# Patient Record
Sex: Male | Born: 1940
Health system: Southern US, Community
[De-identification: ages and names within clinical notes are randomized; demographics above are authoritative.]

## PROBLEM LIST (undated history)

## (undated) DIAGNOSIS — I2510 Atherosclerotic heart disease of native coronary artery without angina pectoris: Secondary | ICD-10-CM

## (undated) DIAGNOSIS — N400 Enlarged prostate without lower urinary tract symptoms: Secondary | ICD-10-CM

## (undated) DIAGNOSIS — I509 Heart failure, unspecified: Secondary | ICD-10-CM

## (undated) DIAGNOSIS — I472 Ventricular tachycardia: Secondary | ICD-10-CM

## (undated) DIAGNOSIS — E785 Hyperlipidemia, unspecified: Secondary | ICD-10-CM

## (undated) DIAGNOSIS — I2699 Other pulmonary embolism without acute cor pulmonale: Secondary | ICD-10-CM

## (undated) DIAGNOSIS — I771 Stricture of artery: Secondary | ICD-10-CM

## (undated) DIAGNOSIS — I10 Essential (primary) hypertension: Secondary | ICD-10-CM

## (undated) DIAGNOSIS — I82409 Acute embolism and thrombosis of unspecified deep veins of unspecified lower extremity: Secondary | ICD-10-CM

## (undated) DIAGNOSIS — E669 Obesity, unspecified: Secondary | ICD-10-CM

## (undated) DIAGNOSIS — R000 Tachycardia, unspecified: Secondary | ICD-10-CM

## (undated) DIAGNOSIS — I38 Endocarditis, valve unspecified: Secondary | ICD-10-CM

## (undated) DIAGNOSIS — M1990 Unspecified osteoarthritis, unspecified site: Secondary | ICD-10-CM

## (undated) NOTE — *Deleted (*Deleted)
   02/12/20 2154  TOC ED Mini Assessment  TOC Time spent with patient (minutes): 25  PING Used in TOC Assessment Yes  Admission or Readmission Diverted Yes  Interventions which prevented an admission or readmission Home Health Consult or Services  What brought you to the Emergency Department?  Fall  Barriers to Discharge No Barriers Identified  Means of departure Ambulance  CMS Medicare.gov Compare Post Acute Care list provided to: Patient  Choice offered to / list presented to  Patient  CM reciecieved consult for [facility] recommendations, ED CM spoke with patient and family who are agreeable, Offered choice but discussed the limitations of available [facility] agencies, patient has no preference. CM will follow up with patient tomorrow on [facility] agency

---

## 2002-07-16 ENCOUNTER — Encounter: Payer: Self-pay | Admitting: *Deleted

## 2002-07-16 ENCOUNTER — Emergency Department (HOSPITAL_COMMUNITY): Admission: EM | Admit: 2002-07-16 | Discharge: 2002-07-16 | Payer: Self-pay | Admitting: Emergency Medicine

## 2005-06-26 ENCOUNTER — Encounter: Payer: Self-pay | Admitting: Emergency Medicine

## 2005-06-26 ENCOUNTER — Ambulatory Visit: Payer: Self-pay | Admitting: Cardiology

## 2005-06-26 ENCOUNTER — Inpatient Hospital Stay (HOSPITAL_COMMUNITY): Admission: EM | Admit: 2005-06-26 | Discharge: 2005-07-02 | Payer: Self-pay | Admitting: Internal Medicine

## 2005-06-28 ENCOUNTER — Encounter: Payer: Self-pay | Admitting: Cardiology

## 2006-09-14 ENCOUNTER — Encounter: Admission: RE | Admit: 2006-09-14 | Discharge: 2006-09-14 | Payer: Self-pay | Admitting: Nephrology

## 2006-12-27 ENCOUNTER — Encounter: Admission: RE | Admit: 2006-12-27 | Discharge: 2006-12-27 | Payer: Self-pay | Admitting: Nephrology

## 2009-08-07 ENCOUNTER — Encounter: Admission: RE | Admit: 2009-08-07 | Discharge: 2009-08-07 | Payer: Self-pay | Admitting: Nephrology

## 2010-07-31 NOTE — Cardiovascular Report (Signed)
NAME:  Kevin [name], Kevin [name] NO.:  [id number]   MEDICAL RECORD NO.:  [id number]          PATIENT TYPE:  INP   LOCATION:  4738                         FACILITY:  [facility]   PHYSICIAN:  Salvadore Farber, M.D. LHCDATE OF BIRTH:  1940/10/12   DATE OF PROCEDURE:  06/30/2005  DATE OF DISCHARGE:                              CARDIAC CATHETERIZATION   PROCEDURE:  1.  Left heart catheterization.  2.  Left ventriculography.  3.  Coronary angiography.  4.  StarClose closure of the right common femoral arteriotomy site.   INDICATIONS:  [name] is a 70 year old gentleman without prior history  of cardiovascular disease.  However, risk factors include diabetes mellitus  and hypertension.  He presents with approximately two months of progressive  dyspnea culminating in severe resting dyspnea.  On admission troponins were  mildly elevated.  He is referred for diagnostic angiography.   PROCEDURAL TECHNIQUE:  Informed consent was obtained.  Under 1% lidocaine  local anesthesia a 5-French sheath was placed in the right common femoral  artery using the modified Seldinger technique.  Diagnostic angiography and  ventriculography were performed using JL4, JR4, and pigtail catheters.  The  arteriotomy was then closed using a StarClose device.  Complete hemostasis  was obtained.  He was then transferred to the holding room in stable  condition having tolerated the procedure well.   COMPLICATIONS:  None.   FINDINGS:  1.  LV 131/19/26.  EF approximately 20% with global hypokinesis.  There is      no aortic stenosis.  There is 1+ mitral regurgitation  2.  Left main:  Angiographically normal.  3.  LAD:  Large vessel wrapping the apex of the heart and giving rise to      moderate sized diagonals.  The mid LAD has a 40% stenosis at the origin      of the first diagonal.  The remainder is free of significant disease.  4.  Circumflex:  Fairly large vessel giving rise to three obtuse  marginals.      It is angiographically normal.  5.  RCA:  Moderate-sized dominant vessel.  There is a 40% stenosis of the      mid vessel.   IMPRESSION/RECOMMENDATIONS:  1.  Severe non-ischemic cardiomyopathy.  2.  Mild non-obstructive coronary disease.  3.  Moderately elevated left ventricular end-diastolic pressure.   Will plan on continued medical therapy for his heart failure.  Will continue  his Coreg.  Will plan on adding ACE inhibitor tomorrow once his IV dye has  cleared.  Wall ultimately need to consider ICD implantation.   Of note, his distal thoracic aorta and abdominal aorta were markedly  tortuous raising concern for aneurysm.  Outpatient ultrasound will be  arranged.      Salvadore Farber, M.D. [facility]  Electronically Signed     WED/MEDQ  D:  06/30/2005  T:  06/30/2005  Job:  [id number]

## 2010-07-31 NOTE — Consult Note (Signed)
NAME:  Kevin [name], Kevin [name] NO.:  [id number]   MEDICAL RECORD NO.:  [id number]          PATIENT TYPE:  INP   LOCATION:  4738                         FACILITY:  [facility]   PHYSICIAN:  Lorain Childes, M.D. LHCDATE OF BIRTH:  1940-10-03   DATE OF CONSULTATION:  06/26/2005  DATE OF DISCHARGE:                                   CONSULTATION   He is new to cardiology and does not have a primary care physician.  He is  admitted by [facility].   CHIEF COMPLAINT:  Chest tightness and shortness of breath.   HISTORY OF PRESENT ILLNESS:  The patient is a 70 year old with history of  chronic renal insufficiency who presents to the ER for further evaluation of  shortness of breath.  He was diagnosed with acute heart failure and admitted  to the [facility] by Della Goo, M.D.  His cardiac enzymes have  now returned positive and cardiology is asked to see him.  The patient  reports he has had gradual increasing shortness of breath over the past  several months, steadily worsening, but not limiting his activity until 3  weeks ago.  He now becomes dyspneic when walking to the diner at the motel  where he lives.  The walk is uphill and has become more strenuous.  He has  also had some mild chest tightness with walking.  He denies any nausea,  vomiting, and no diaphoresis.  He does report some discomfort extending into  his left arm with heaviness.  He disregarded this discomfort until today  when his symptoms progressed this morning.  He awoke and he was feeling  okay, but then became more short of breath as the day progressed.  As he  tried to dress and do his activities, he felt more short of breath and had  chest tightness.  He then came to the emergency room for further evaluation.  He denies any palpitations, no syncopal events, no PND.  He has two-pillow  orthopnea.  No lower extremity edema.  Currently, he reports that his chest  tightness or chest  pain-free.  He continues to have dyspnea, but it is  improved with the Lasix he received in the emergency room.   PAST MEDICAL HISTORY:  Chronic renal insufficiency.  He does not know his  baseline creatinine.  He has been told that he had some kidney problems.  Obesity.   HOME MEDICATIONS:  None.   CURRENT MEDICATIONS:  1.  Lasix 40 mg IV q.8 hours.  2.  Aspirin 325 mg p.o. daily.  3.  Nitropaste one inch topically q.6 hours.  4.  Lopressor 5 mg IV q.6 hours.  5.  Lovenox per pharmacy.  6.  Protonix 40 mg daily.  7.  Albuterol and Atrovent nebulizer.   ALLERGIES:  No known drug allergies.   SOCIAL HISTORY:  He lives in [place].  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 He  last did this 2 months ago and reports he is able to do his activities  without interruption.  He has a 60+ pack-year history of tobacco, he quit 4  weeks ago due to shortness of breath.  He denies any alcohol, no drugs, and  no herbal medication use.  He does not follow any regular exercise.   FAMILY HISTORY:  His mother died at the age of 50 from an MI and a stroke.  Father died related to diabetic complications.  He has a brother who has  coronary artery disease who had stents placed at the age of 64.   REVIEW OF SYSTEMS:  He denies any fevers, chills, no sweats, no weight  changes.  No headache or visual changes.  No skin rashes or lesions.  He  reports chest pain per HPI and shortness of breath as described above.  He  also has some wheezing.  He has coughing with gray sputum production.  He  denies any urinary symptoms, no focal weakness.  No nausea and vomiting.  No  bright red blood per rectum.  No melena, and no GERD symptoms.  All other  symptoms are negative.   PHYSICAL EXAMINATION:  VITAL SIGNS:  Temperature 98.8, pulse 100,  respirations 20, blood pressure 179/123, he is saturating 98% on 2 liters  nasal cannula.  GENERAL:  He is a very obese man sitting on the side of the bed in no acute   distress.  HEENT:  Normocephalic and atraumatic.  NECK:  JVP is approximately 10-11 cm.  He has 2+ carotid upstroke.  No  bruits.  CARDIOVASCULAR:  Normal S1, split S2, regular rate and rhythm.  He has an S4  present.  Pulses are 2+ throughout, there is no bruit.  He has a systolic  murmur noted at the apex.  He has coarse breath sounds which obscure some of  the findings.  LUNGS:  He has coarse breath sounds with expiratory wheezing noted.  He has  rales noted on his right base approximately 1/4 of the way up.  ABDOMEN:  Soft, obese, positive bowel sounds, nontender, and no  organomegaly.  EXTREMITIES:  He has trace bipedal edema.  NEUROLOGY:  Appears nonfocal.   Chest x-ray shows pulmonary edema.   EKG shows rate of 82, sinus rhythm, left axis deviation, incomplete left  bundle branch block.  PR interval 134 milliseconds, QRS 112 milliseconds,  QTC 488.  He has T wave inversions in aVL and V6.  He has T wave flattening  in 1 and V5.  He has no EKG for comparison.   LABORATORY DATA:  White count 4.3, hematocrit 47.1, platelets 210,  creatinine 1.7, potassium 5.1.  CK 431, MB 13.6, troponin 1.26.   ASSESSMENT:  The patient is a 70 year old gentleman who presents with acute  heart failure and MI.  1.  Coronary artery disease.  The patient has a non-ST segment elevation      myocardial infarction.  We will continue to monitor him on telemetry and      check cardiac enzymes for peak in trend.  We will give anticoagulation      with Lovenox dosing per pharmacy.  I agree with his aspirin therapy and      starting him on a beta blocker and statin.  He has had chest tightness      today and his cardiac enzymes are negative, so I will start him on      eptifibatide with pharmacy to dose.  Also begin him on a nitroglycerin      drip to assist with his blood pressure and with angina.  We will likely  plan a cardiac catheterization on Monday.  We will check a fasting lipid     panel in  the interim and adjust his statin as needed.  Thankfully he has      stopped smoking.  We encouraged him to continue in this effort.  2.  Acute heart failure, likely ischemic etiology and also his hypertension      contributing.  We are managing his CAD as above.  Agree with diuresing      him with IV Lasix to improve his overall volume status.  We need to      aggressively control his blood pressure by starting him on nitroglycerin      drip to assist his blood pressure control and this is also assisting      with preload and after load reduction for his heart failure.  We will      titrate his medications as we try to wean his nitroglycerin drip.  3.  Chronic obstructive pulmonary disease.  The patient has wheezing on      examination.  We will monitor his respiratory status as he is on a beta      blocker.  He is receiving nebulizers p.r.n.  We may consider switching      his albuterol to Xopenex.  4.  Chronic renal insufficiency.  The patient does not know his baseline      creatinine.  His creatinine today is 1.7.  We will continue to monitor      this closely and recommend checking a UA for protein.  His renal      insufficiency is likely secondary to untreated hypertension.  5.  Social.  The patient has some social issues and will require a social      work consult during this hospitalization.           ______________________________  Lorain Childes, M.D. [facility]     CGF/MEDQ  D:  06/27/2005  T:  06/28/2005  Job:  [id number]

## 2010-07-31 NOTE — H&P (Signed)
NAME:  Kevin [name], Kevin [name] NO.:  [id number]   MEDICAL RECORD NO.:  [id number]          PATIENT TYPE:  EMS   LOCATION:  ED                           FACILITY[facility]  [facility]   PHYSICIAN:  Della Goo, M.D. DATE OF BIRTH:  [birth year]   DATE OF ADMISSION:  06/26/2005  DATE OF DISCHARGE:                                HISTORY & PHYSICAL   CHIEF COMPLAINT:  Worsening work of breathing.   HISTORY OF PRESENT ILLNESS:  This is a 70 year old male with complaints of  worsening shortness of breath over a 3-week period. He reports prior to this  he had been getting progressive shortness of breath for over 1.5 years. The  patient had not been to see a physician in regard to his symptoms. He denies  having any cough, fevers, chills or chest pain associated with this. However  he did report having dyspnea on exertion and mild edema of both ankles. He  reports taking a neighbors nebulizer treatment last p.m. and having some  minor relief. However symptoms continued to worsen and patient presented to  the [facility] emergency department for evaluation. The patient was  evaluated in the emergency department, placed on 3 liters of nasal cannula  oxygen with relief of symptoms. His oxygen saturations were 98% and  increased to 99%. Initial laboratory studies which were performed revealed a  BNP of 1220 which is highly elevated. Chest x-ray examination also revealed  a right pleural effusion and congestive heart failure signs. The patient was  diagnosed with acute congestive heart failure, given IV Lasix 40 mg x1 dose  and began to have diuresis.   REVIEW OF SYSTEMS:  No fever, chills, nausea, vomiting, no cough or cough  production. Chronic shortness of breath. No wheezes, no syncope, no  dizziness, no chest pain. The patient does have joint pains consistent with  osteoarthritis.   PAST MEDICAL HISTORY:  None per patient report.   PAST SURGICAL HISTORY:  None per patient report.   MEDICATIONS:  None per patient report.   ALLERGIES:  No known drug allergies.   SOCIAL HISTORY:  The patient lives alone. Son lives locally. He previously  worked as a Designer, fashion/clothing. Reports quitting tobacco 4 weeks ago. However has a  chronic history of tobacco usage. Denies alcohol or drug usage.   PHYSICAL EXAMINATION:  GENERAL:  A pleasant 70 year old Native American  male, obese, well-developed. Currently in no acute distress.  VITAL SIGNS:  Temperature 97.0, blood pressure 180/90 on admission,  currently 149/91, heart rate 94, respirations 14. Oxygen saturation on 3  liters 99%.  HEENT:  Normocephalic, atraumatic. Pupils are equal, round and reactive to  light. Extraocular muscles are intact. There is no scleral icterus. Tympanic  membranes are clear bilaterally. Poor dentition. No exudate.  NECK:  Supple, full range of motion, no thyromegaly, no jugular venous  distension.  CARDIOVASCULAR:  Regular rate and rhythm with occasional ectopic beats. No  murmurs, rubs, or gallops.  LUNGS:  With decreased breath sounds. However clear to auscultation without  wheezes or rhonchi.  ABDOMEN:  Positive bowel sounds, soft, obese, nontender, nondistended.  No  hepatosplenomegaly.  EXTREMITIES:  Trace edema to the pretibial area.  NEUROLOGIC EXAMINATION:  Nonfocal.  GENITOURINARY:  Deferred.   LABORATORY STUDIES:  CBC with a white blood cell count of 4.3 thousand,  hemoglobin 15.0, hematocrit 47.1, platelets 210,000, neutrophils 59%,  lymphocytes 30%. Chemistries with sodium of 135, potassium 5.1, chloride 94,  CO2 33, BUN 13, creatinine 1.7 and glucose 107. Calcium level 9.0. BNP 1220.  ABG with a pH of 7.455, PCO2 40.8, PO2 122, bicarb 28.2, O2 saturation 97.9.  EKG reveals a normal sinus rhythm with occasions PVCs. Chest x-ray with  findings consistent with congestive heart failure and a right-sided pleural  effusion.   ASSESSMENT:  1.  Acute congestive heart failure.  2.  Right pleural  effusion.  3.  Hypertension which is reactive versus essential.  4.  Chronic renal insufficiency which is mild.  5.  Tobacco history.  6.  Tinea infection of the right hand.   PLAN:  The patient has been admitted to the telemetry area. Cardiac enzymes  will be continued x3. Supplemental nasal cannula oxygen and aspirin therapy  has been ordered. The patient will continue on IV Lasix q.8 hours with  potassium supplementation. His blood pressures will be monitored for changes  and patient has also been placed on GI and DVT prophylaxis. Nebulizer  treatments have also been ordered p.r.n.           ______________________________  Della Goo, M.D.     HJ/MEDQ  D:  06/26/2005  T:  06/26/2005  Job:  [id number]

## 2010-07-31 NOTE — Discharge Summary (Signed)
NAME:  Kevin [name], Kevin [name]              ACCOUNT NO.:  [id number]   MEDICAL RECORD NO.:  [id number]          PATIENT TYPE:  INP   LOCATION:  4738                         FACILITY:  [facility]   PHYSICIAN:  Mobolaji B. Bakare, M.D.DATE OF BIRTH:  [birth year]   DATE OF ADMISSION:  06/26/2005  DATE OF DISCHARGE:  07/02/2005                                 DISCHARGE SUMMARY   PRIMARY CARE PHYSICIAN:  Unassigned.   CONSULTS:  Cardiology consult, Dr. Gala Romney.   FINAL DIAGNOSES:  1.  Congestive heart failure.  2.  Nonischemic cardiomyopathy with ejection fraction of 20%.  3.  Non-ST-elevation myocardial infarction.  4.  Wide-complex tachycardia on telemetry.  5.  Renal insufficiency.  6.  Hypertension.  7.  Obesity.  8.  Hypokalemia.  9.  Hematuria.  10. Benign prostatic hypertrophy.   PROCEDURES:  1.  Chest x-ray done on June 26, 2005, showed chronic-appearing lung      changes with interstitial prominence; cardiac enlargement; right basilar      process, possibly a combination of effusion, atelectasis and/or      infiltrate.  2.  Chest x-ray on April 18 showed improving right lower lobe atelectasis      versus pneumonia, cardiomegaly with vascular congestion but no definite      CHF.  3.  A 2-D echocardiogram done on June 27, 2005, showed left ventricular      systolic function markedly reduced with ejection fraction estimated to      be 10-20% with severe diffuse left ventricular hypokinesia, moderate      mitral valve regurgitation, increased estimated peak pulmonary artery      pressure, increased estimated peak pulmonary artery pressure, mildly      dilated left atrium.  4.  Cardiac catheterization done on June 30, 2005.  Please see full report      for details.  He has severe nonischemic cardiomyopathy, mild to      nonobstructive coronary disease, moderately elevated left ventricular      end-diastolic pressure.   BRIEF HISTORY:  Kevin [name] is a 70 year old Native American  male who has  no primary care physician and no known medical problems.  He presented with  progressive worsening shortness of breath over the course of 18 months.  It  became worse in the preceding weeks prior to hospitalization.  He had mild  ankle swelling, dyspnea on exertion.  There were no accompanying chills or  pleuritic chest pain.  On initial evaluation, his vital signs were blood  pressure of 180/90, heart rate of 94, O2 saturations 99% on 3 L.  Physical  examination revealed an obese gentleman who was in no acute distress.  There  were decreased breath sounds in both lung fields.  There were no wheezes or  rhonchi.  CVS revealed S1, S2 without murmur or gallop.  There were  occasional ectopic beats.  He had no elevated neck veins; however, he has a  short neck.  Abdomen was obese and nontender.  The rest of physical  examination was unremarkable.  Initial laboratory data showed BNP of 1220,  creatinine of  1.7, and BUN of 13.  EKG revealed normal sinus rhythm with  occasional PVCs.  Initial chest x-ray findings were consistent with  cardiomegaly and bibasilar effusions.  [name] was admitted for acute  CHF, hypertension, renal insufficiency.  He needed further evaluation and  stabilization.  Cardiology consult was obtained.   HOSPITAL COURSE:  #1 - CONGESTIVE HEART FAILURE.  [name] was admitted  onto telemetry and to be ruled out.  He was started on IV Lasix,  nitroglycerin, Lopressor, and aspirin.  He was also given nebulization  treatment.  His initial cardiac enzymes revealed a CK-MB of 13.6, troponin  of 1.26, and relative index of 3.2.  These were consistent with non-ST-  elevation MI.  He had no EKG changes.  He was started on Integrilin and  heparin.  Cardiology recommended cardiac catheterization.  He underwent  cardiac catheterization on April 18; result is as noted above.  Recommendation is to continue medical treatment with ACE inhibitor and beta  blocker.   The patient may ultimately need to be considered for ICD.  He did  diurese well, was placed on fluid restriction.  Lasix was transitioned from  IV to p.o.  He will follow up with Dr. Gala Romney in the office.  His weight  on discharge was 247.5 pounds (weight on admission was 257.6 pounds).   The patient had short runs of wide-complex tachycardia on telemetry.  He was  discharged home on beta blocker.   #2 - BENIGN PROSTATIC HYPERTROPHY.  While the patient was on diuretics he  developed urinary symptoms which included pressure and increased frequency  of micturition, which is not unexplained with diuretics.  However, he had  very little quantity voiding at each time and he strained at micturition.  Symptomatically, this was compatible with BPH.  A Foley catheter was  inserted.  He subsequently developed hematuria and this was felt to be  related to probable trauma.  The patient continued to have hematuria and  subsequently developed frank bleeding.  Integrilin and heparin were  discontinued.  Hematuria subsided.  There was no remarkable change in his  hemoglobin and hematocrit.  He did not need blood transfusion.  Urine became  clearer and at the time of discharge there was no tinge of blood in his  urine.  I discussed with Dr. Laverle Patter.  Decision was to follow up with Dr.  Laverle Patter in 1 week.  The patient should have Foley catheter in place.  He was  started on Flomax 0.4 mg daily.  The patient will have further evaluation in  Dr. Vevelyn Royals office.   #3 - HYPERTENSION.  His blood pressure was initially uncontrolled at the  time of admission.  The patient has no known history of hypertension.  He  was started on beta blocker and ACE inhibitor.  Blood pressure at the time  of discharge was controlled.   #4 - RENAL INSUFFICIENCY.  Creatinine at the time of admission was 1.7.  The  patient's baseline creatinine was unknown.  Nephrotoxic medications were avoided.  At the time of discharge,  creatinine was 1.5 with a creatinine  clearance of 47.1.  He had episodes of hyperkalemia and hypokalemia and  these were corrected accordingly.   #5 - OBESITY AND TOBACCO ABUSE.  The patient was counseled regarding weight  loss and tobacco cessation.  He expressed understanding regarding his diet  and also tobacco cessation.   DISPOSITION:  Arrangement was made for the patient to follow up with [facility] in [place].  Was given contact information.  Home health R.N.  was also arranged to follow up.  Home health social worker and nurse were  arranged to follow up with the patient via [facility].  He would  also follow up with Dr. Gala Romney in the office.   CONDITION ON DISCHARGE:  Stable.  He was hemodynamically stable, saturating  well on room air.  He did not require home oxygen.   DISCHARGE MEDICATIONS:  1.  Lipitor 20 mg q.p.m.  2.  Coreg 6.25 mg b.i.d.  3.  Lisinopril 10 mg daily.  4.  Lasix 40 mg daily p.o.  5.  Flomax 0.4 mg daily.  6.  Nizoral cream applied to rash (Candida intertrigo).   FOLLOWUP:  1.  With Dr. Heloise Purpura in 1 week.  2.  With Dr. Gala Romney on Jul 22, 2005, at 12:15 p.m.   RECOMMENDATIONS/SPECIAL INSTRUCTIONS:  The patient to have BMET checked on  July 07, 2005, at [facility] and he should check his weight three  times a week and show record to his cardiology.   DIET:  Low salt diet/low cholesterol diet, fluid restriction.   PERTINENT LABORATORY DATA:  Discharge BMET:  Sodium 133, potassium 4.2,  chloride 97, CO2 28, glucose 100, BUN 21, creatinine 1.5, calcium 8.5.  White cells 4.4, hemoglobin 13.5, hematocrit 41.9, platelets 172.  BNP 498.  Total cholesterol 124, triglycerides 62, HDL 24, LDL 88.  TSH 1.829.      Mobolaji B. Corky Downs, M.D.  Electronically Signed     MBB/MEDQ  D:  07/13/2005  T:  07/13/2005  Job:  [id number]   cc:   Crecencio Mc, M.D.  Fax: [phone]   Arvilla Meres, M.D. [facility]  [employer]  [address]  [place]  [place] [address]

## 2010-09-15 ENCOUNTER — Other Ambulatory Visit: Payer: Self-pay | Admitting: Nephrology

## 2010-09-15 ENCOUNTER — Ambulatory Visit
Admission: RE | Admit: 2010-09-15 | Discharge: 2010-09-15 | Disposition: A | Discharge status: Home / Self Care | Payer: Medicare Other | Source: Ambulatory Visit | Attending: Nephrology | Admitting: Nephrology

## 2010-09-15 DIAGNOSIS — F17200 Nicotine dependence, unspecified, uncomplicated: Secondary | ICD-10-CM

## 2011-03-25 DIAGNOSIS — M545 Low back pain: Secondary | ICD-10-CM | POA: Diagnosis not present

## 2011-04-27 DIAGNOSIS — M545 Low back pain: Secondary | ICD-10-CM | POA: Diagnosis not present

## 2011-05-12 DIAGNOSIS — M545 Low back pain: Secondary | ICD-10-CM | POA: Diagnosis not present

## 2011-06-10 DIAGNOSIS — M545 Low back pain: Secondary | ICD-10-CM | POA: Diagnosis not present

## 2011-07-12 DIAGNOSIS — I10 Essential (primary) hypertension: Secondary | ICD-10-CM | POA: Diagnosis not present

## 2011-07-12 DIAGNOSIS — M545 Low back pain: Secondary | ICD-10-CM | POA: Diagnosis not present

## 2011-08-05 DIAGNOSIS — M545 Low back pain: Secondary | ICD-10-CM | POA: Diagnosis not present

## 2011-09-06 DIAGNOSIS — M545 Low back pain: Secondary | ICD-10-CM | POA: Diagnosis not present

## 2011-10-07 DIAGNOSIS — M545 Low back pain: Secondary | ICD-10-CM | POA: Diagnosis not present

## 2011-10-21 DIAGNOSIS — L0291 Cutaneous abscess, unspecified: Secondary | ICD-10-CM | POA: Diagnosis not present

## 2011-10-21 DIAGNOSIS — L2089 Other atopic dermatitis: Secondary | ICD-10-CM | POA: Diagnosis not present

## 2011-11-11 DIAGNOSIS — D235 Other benign neoplasm of skin of trunk: Secondary | ICD-10-CM | POA: Diagnosis not present

## 2011-11-11 DIAGNOSIS — B354 Tinea corporis: Secondary | ICD-10-CM | POA: Diagnosis not present

## 2011-11-11 DIAGNOSIS — A499 Bacterial infection, unspecified: Secondary | ICD-10-CM | POA: Diagnosis not present

## 2011-11-12 DIAGNOSIS — M545 Low back pain: Secondary | ICD-10-CM | POA: Diagnosis not present

## 2011-11-27 DIAGNOSIS — R627 Adult failure to thrive: Secondary | ICD-10-CM | POA: Diagnosis not present

## 2011-11-27 DIAGNOSIS — N138 Other obstructive and reflux uropathy: Secondary | ICD-10-CM | POA: Diagnosis present

## 2011-11-27 DIAGNOSIS — R262 Difficulty in walking, not elsewhere classified: Secondary | ICD-10-CM | POA: Diagnosis not present

## 2011-11-27 DIAGNOSIS — I2510 Atherosclerotic heart disease of native coronary artery without angina pectoris: Secondary | ICD-10-CM | POA: Diagnosis present

## 2011-11-27 DIAGNOSIS — R5381 Other malaise: Secondary | ICD-10-CM | POA: Diagnosis not present

## 2011-11-27 DIAGNOSIS — M545 Low back pain: Secondary | ICD-10-CM | POA: Diagnosis not present

## 2011-11-27 DIAGNOSIS — G894 Chronic pain syndrome: Secondary | ICD-10-CM | POA: Diagnosis not present

## 2011-11-27 DIAGNOSIS — I10 Essential (primary) hypertension: Secondary | ICD-10-CM | POA: Diagnosis not present

## 2011-11-27 DIAGNOSIS — F17200 Nicotine dependence, unspecified, uncomplicated: Secondary | ICD-10-CM | POA: Diagnosis not present

## 2011-11-27 DIAGNOSIS — R5383 Other fatigue: Secondary | ICD-10-CM | POA: Diagnosis not present

## 2011-11-27 DIAGNOSIS — E785 Hyperlipidemia, unspecified: Secondary | ICD-10-CM | POA: Diagnosis present

## 2011-11-27 DIAGNOSIS — E860 Dehydration: Secondary | ICD-10-CM | POA: Diagnosis not present

## 2011-11-27 DIAGNOSIS — R29898 Other symptoms and signs involving the musculoskeletal system: Secondary | ICD-10-CM | POA: Diagnosis present

## 2011-11-27 DIAGNOSIS — M519 Unspecified thoracic, thoracolumbar and lumbosacral intervertebral disc disorder: Secondary | ICD-10-CM | POA: Diagnosis not present

## 2011-11-27 DIAGNOSIS — N289 Disorder of kidney and ureter, unspecified: Secondary | ICD-10-CM | POA: Diagnosis not present

## 2011-11-27 DIAGNOSIS — M549 Dorsalgia, unspecified: Secondary | ICD-10-CM | POA: Diagnosis not present

## 2011-11-27 DIAGNOSIS — I130 Hypertensive heart and chronic kidney disease with heart failure and stage 1 through stage 4 chronic kidney disease, or unspecified chronic kidney disease: Secondary | ICD-10-CM | POA: Diagnosis not present

## 2011-11-27 DIAGNOSIS — R339 Retention of urine, unspecified: Secondary | ICD-10-CM | POA: Diagnosis present

## 2011-11-27 DIAGNOSIS — J449 Chronic obstructive pulmonary disease, unspecified: Secondary | ICD-10-CM | POA: Diagnosis not present

## 2011-11-27 DIAGNOSIS — N401 Enlarged prostate with lower urinary tract symptoms: Secondary | ICD-10-CM | POA: Diagnosis not present

## 2011-11-27 DIAGNOSIS — N179 Acute kidney failure, unspecified: Secondary | ICD-10-CM | POA: Diagnosis present

## 2011-11-27 DIAGNOSIS — I119 Hypertensive heart disease without heart failure: Secondary | ICD-10-CM | POA: Diagnosis not present

## 2011-11-27 DIAGNOSIS — J4489 Other specified chronic obstructive pulmonary disease: Secondary | ICD-10-CM | POA: Diagnosis not present

## 2011-12-27 DIAGNOSIS — M48061 Spinal stenosis, lumbar region without neurogenic claudication: Secondary | ICD-10-CM | POA: Diagnosis not present

## 2011-12-31 ENCOUNTER — Inpatient Hospital Stay (HOSPITAL_COMMUNITY)
Admission: EM | Admit: 2011-12-31 | Discharge: 2012-01-10 | DRG: 853 | Disposition: A | Discharge status: Rehabilitation Facility | Payer: Medicare Other | Attending: Internal Medicine | Admitting: Internal Medicine

## 2011-12-31 ENCOUNTER — Emergency Department (HOSPITAL_COMMUNITY): Payer: Medicare Other

## 2011-12-31 ENCOUNTER — Encounter (HOSPITAL_COMMUNITY): Payer: Self-pay

## 2011-12-31 DIAGNOSIS — N179 Acute kidney failure, unspecified: Secondary | ICD-10-CM | POA: Diagnosis present

## 2011-12-31 DIAGNOSIS — I5022 Chronic systolic (congestive) heart failure: Secondary | ICD-10-CM | POA: Diagnosis present

## 2011-12-31 DIAGNOSIS — I82A19 Acute embolism and thrombosis of unspecified axillary vein: Secondary | ICD-10-CM | POA: Diagnosis present

## 2011-12-31 DIAGNOSIS — I70269 Atherosclerosis of native arteries of extremities with gangrene, unspecified extremity: Secondary | ICD-10-CM | POA: Diagnosis present

## 2011-12-31 DIAGNOSIS — L97809 Non-pressure chronic ulcer of other part of unspecified lower leg with unspecified severity: Secondary | ICD-10-CM | POA: Diagnosis present

## 2011-12-31 DIAGNOSIS — I2510 Atherosclerotic heart disease of native coronary artery without angina pectoris: Secondary | ICD-10-CM

## 2011-12-31 DIAGNOSIS — I472 Ventricular tachycardia, unspecified: Secondary | ICD-10-CM | POA: Diagnosis present

## 2011-12-31 DIAGNOSIS — E875 Hyperkalemia: Secondary | ICD-10-CM | POA: Diagnosis present

## 2011-12-31 DIAGNOSIS — R6520 Severe sepsis without septic shock: Secondary | ICD-10-CM | POA: Diagnosis not present

## 2011-12-31 DIAGNOSIS — Z7982 Long term (current) use of aspirin: Secondary | ICD-10-CM | POA: Diagnosis not present

## 2011-12-31 DIAGNOSIS — A419 Sepsis, unspecified organism: Principal | ICD-10-CM | POA: Diagnosis present

## 2011-12-31 DIAGNOSIS — R0602 Shortness of breath: Secondary | ICD-10-CM | POA: Diagnosis not present

## 2011-12-31 DIAGNOSIS — N401 Enlarged prostate with lower urinary tract symptoms: Secondary | ICD-10-CM | POA: Diagnosis not present

## 2011-12-31 DIAGNOSIS — N2889 Other specified disorders of kidney and ureter: Secondary | ICD-10-CM

## 2011-12-31 DIAGNOSIS — I120 Hypertensive chronic kidney disease with stage 5 chronic kidney disease or end stage renal disease: Secondary | ICD-10-CM | POA: Diagnosis not present

## 2011-12-31 DIAGNOSIS — I501 Left ventricular failure: Secondary | ICD-10-CM | POA: Diagnosis not present

## 2011-12-31 DIAGNOSIS — I428 Other cardiomyopathies: Secondary | ICD-10-CM | POA: Diagnosis present

## 2011-12-31 DIAGNOSIS — L97929 Non-pressure chronic ulcer of unspecified part of left lower leg with unspecified severity: Secondary | ICD-10-CM

## 2011-12-31 DIAGNOSIS — I999 Unspecified disorder of circulatory system: Secondary | ICD-10-CM | POA: Diagnosis not present

## 2011-12-31 DIAGNOSIS — I77811 Abdominal aortic ectasia: Secondary | ICD-10-CM | POA: Diagnosis not present

## 2011-12-31 DIAGNOSIS — I798 Other disorders of arteries, arterioles and capillaries in diseases classified elsewhere: Secondary | ICD-10-CM | POA: Diagnosis not present

## 2011-12-31 DIAGNOSIS — I959 Hypotension, unspecified: Secondary | ICD-10-CM

## 2011-12-31 DIAGNOSIS — I509 Heart failure, unspecified: Secondary | ICD-10-CM | POA: Diagnosis present

## 2011-12-31 DIAGNOSIS — I998 Other disorder of circulatory system: Secondary | ICD-10-CM | POA: Diagnosis present

## 2011-12-31 DIAGNOSIS — I214 Non-ST elevation (NSTEMI) myocardial infarction: Secondary | ICD-10-CM | POA: Diagnosis not present

## 2011-12-31 DIAGNOSIS — K8020 Calculus of gallbladder without cholecystitis without obstruction: Secondary | ICD-10-CM | POA: Diagnosis not present

## 2011-12-31 DIAGNOSIS — I824Y9 Acute embolism and thrombosis of unspecified deep veins of unspecified proximal lower extremity: Secondary | ICD-10-CM | POA: Diagnosis not present

## 2011-12-31 DIAGNOSIS — R6521 Severe sepsis with septic shock: Secondary | ICD-10-CM | POA: Diagnosis not present

## 2011-12-31 DIAGNOSIS — R0989 Other specified symptoms and signs involving the circulatory and respiratory systems: Secondary | ICD-10-CM | POA: Diagnosis not present

## 2011-12-31 DIAGNOSIS — N289 Disorder of kidney and ureter, unspecified: Secondary | ICD-10-CM | POA: Diagnosis present

## 2011-12-31 DIAGNOSIS — E785 Hyperlipidemia, unspecified: Secondary | ICD-10-CM | POA: Diagnosis not present

## 2011-12-31 DIAGNOSIS — I10 Essential (primary) hypertension: Secondary | ICD-10-CM | POA: Diagnosis not present

## 2011-12-31 DIAGNOSIS — I50810 Right heart failure, unspecified: Secondary | ICD-10-CM | POA: Diagnosis not present

## 2011-12-31 DIAGNOSIS — M79609 Pain in unspecified limb: Secondary | ICD-10-CM | POA: Diagnosis not present

## 2011-12-31 DIAGNOSIS — L03119 Cellulitis of unspecified part of limb: Secondary | ICD-10-CM | POA: Diagnosis present

## 2011-12-31 DIAGNOSIS — L02419 Cutaneous abscess of limb, unspecified: Secondary | ICD-10-CM | POA: Diagnosis present

## 2011-12-31 DIAGNOSIS — Z09 Encounter for follow-up examination after completed treatment for conditions other than malignant neoplasm: Secondary | ICD-10-CM | POA: Diagnosis not present

## 2011-12-31 DIAGNOSIS — Z87891 Personal history of nicotine dependence: Secondary | ICD-10-CM

## 2011-12-31 DIAGNOSIS — N400 Enlarged prostate without lower urinary tract symptoms: Secondary | ICD-10-CM | POA: Diagnosis present

## 2011-12-31 DIAGNOSIS — I2699 Other pulmonary embolism without acute cor pulmonale: Secondary | ICD-10-CM | POA: Diagnosis present

## 2011-12-31 DIAGNOSIS — Z5189 Encounter for other specified aftercare: Secondary | ICD-10-CM | POA: Diagnosis not present

## 2011-12-31 DIAGNOSIS — I129 Hypertensive chronic kidney disease with stage 1 through stage 4 chronic kidney disease, or unspecified chronic kidney disease: Secondary | ICD-10-CM | POA: Diagnosis present

## 2011-12-31 DIAGNOSIS — I739 Peripheral vascular disease, unspecified: Secondary | ICD-10-CM | POA: Diagnosis not present

## 2011-12-31 DIAGNOSIS — Z8679 Personal history of other diseases of the circulatory system: Secondary | ICD-10-CM | POA: Diagnosis not present

## 2011-12-31 DIAGNOSIS — N189 Chronic kidney disease, unspecified: Secondary | ICD-10-CM | POA: Diagnosis not present

## 2011-12-31 DIAGNOSIS — I8010 Phlebitis and thrombophlebitis of unspecified femoral vein: Secondary | ICD-10-CM | POA: Diagnosis not present

## 2011-12-31 DIAGNOSIS — Z720 Tobacco use: Secondary | ICD-10-CM | POA: Diagnosis present

## 2011-12-31 DIAGNOSIS — M25473 Effusion, unspecified ankle: Secondary | ICD-10-CM | POA: Diagnosis not present

## 2011-12-31 DIAGNOSIS — I4729 Other ventricular tachycardia: Secondary | ICD-10-CM | POA: Diagnosis present

## 2011-12-31 DIAGNOSIS — M7989 Other specified soft tissue disorders: Secondary | ICD-10-CM | POA: Diagnosis not present

## 2011-12-31 DIAGNOSIS — N184 Chronic kidney disease, stage 4 (severe): Secondary | ICD-10-CM | POA: Diagnosis present

## 2011-12-31 DIAGNOSIS — S78119A Complete traumatic amputation at level between unspecified hip and knee, initial encounter: Secondary | ICD-10-CM | POA: Diagnosis not present

## 2011-12-31 DIAGNOSIS — IMO0002 Reserved for concepts with insufficient information to code with codable children: Secondary | ICD-10-CM

## 2011-12-31 HISTORY — DX: Essential (primary) hypertension: I10

## 2011-12-31 HISTORY — DX: Hyperlipidemia, unspecified: E78.5

## 2011-12-31 HISTORY — DX: Unspecified osteoarthritis, unspecified site: M19.90

## 2011-12-31 HISTORY — DX: Heart failure, unspecified: I50.9

## 2011-12-31 HISTORY — DX: Endocarditis, valve unspecified: I38

## 2011-12-31 HISTORY — DX: Benign prostatic hyperplasia without lower urinary tract symptoms: N40.0

## 2011-12-31 HISTORY — DX: Tachycardia, unspecified: R00.0

## 2011-12-31 HISTORY — DX: Stricture of artery: I77.1

## 2011-12-31 HISTORY — DX: Atherosclerotic heart disease of native coronary artery without angina pectoris: I25.10

## 2011-12-31 HISTORY — DX: Obesity, unspecified: E66.9

## 2011-12-31 HISTORY — DX: Ventricular tachycardia: I47.2

## 2011-12-31 LAB — COMPREHENSIVE METABOLIC PANEL
AST: 23 U/L (ref 0–37)
Albumin: 2.6 g/dL — ABNORMAL LOW (ref 3.5–5.2)
Alkaline Phosphatase: 88 U/L (ref 39–117)
BUN: 36 mg/dL — ABNORMAL HIGH (ref 6–23)
CO2: 24 mEq/L (ref 19–32)
Chloride: 103 mEq/L (ref 96–112)
Creatinine, Ser: 1.59 mg/dL — ABNORMAL HIGH (ref 0.50–1.35)
GFR calc non Af Amer: 42 mL/min — ABNORMAL LOW (ref >90)
Potassium: 4.6 mEq/L (ref 3.5–5.1)
Total Bilirubin: 0.2 mg/dL — ABNORMAL LOW (ref 0.3–1.2)

## 2011-12-31 LAB — CBC WITH DIFFERENTIAL/PLATELET
Basophils Relative: 0 % (ref 0–1)
HCT: 43 % (ref 39.0–52.0)
Hemoglobin: 14.3 g/dL (ref 13.0–17.0)
Lymphocytes Relative: 12 % (ref 12–46)
Monocytes Absolute: 1.1 10*3/uL — ABNORMAL HIGH (ref 0.1–1.0)
Monocytes Relative: 6 % (ref 3–12)
Neutro Abs: 14.5 10*3/uL — ABNORMAL HIGH (ref 1.7–7.7)
Neutrophils Relative %: 81 % — ABNORMAL HIGH (ref 43–77)
RBC: 5.12 MIL/uL (ref 4.22–5.81)
WBC: 17.8 10*3/uL — ABNORMAL HIGH (ref 4.0–10.5)

## 2011-12-31 LAB — APTT: aPTT: 27 seconds (ref 24–37)

## 2011-12-31 LAB — PROTIME-INR: Prothrombin Time: 14.4 seconds (ref 11.6–15.2)

## 2011-12-31 MED ORDER — MORPHINE SULFATE 4 MG/ML IJ SOLN
4.0000 mg | Freq: Once | INTRAMUSCULAR | Status: AC
  Administered 2011-12-31: 4 mg via INTRAVENOUS
  Filled 2011-12-31: qty 1

## 2011-12-31 NOTE — ED Notes (Signed)
Attempted to take pt bp x3 on each arm and machine did not read, plan to repeat after giving pt a break

## 2011-12-31 NOTE — ED Notes (Signed)
Still waiting for vascular.  Tried calling vascular lab but no answer

## 2011-12-31 NOTE — ED Notes (Signed)
Provided pt with urinal

## 2011-12-31 NOTE — ED Provider Notes (Cosign Needed)
History  This chart was scribed for Ward Givens, MD by Ladona Ridgel Day. This patient was seen in room TR06C/TR06C and the patient's care was started at 1435.  CSN: [id number]  Arrival date & time 12/31/11  1435   None    Chief Complaint  Patient presents with  . Foot Pain   The history is provided by the patient. No language interpreter was used.   Kevin [name] is a 71 y.o. male who presents to the Emergency Department complaining of constant gradually worsening left foot/ankle swelling over the past 3 days and reports multiple lesions on his left leg over the past month with fungal growth between and around his toes. He states injured his left big toe 3 days ago while getting himself out of his wheelchair and bumped his toe on it, now has some swelling and bruising/purple color to his big toe. No fever.   Pt had a steroid injection in his back 1 1/2 weeks ago.  He was recently in the hospital a month ago for duration of 5 days after he had an episode of sudden back pain and fell to the floor, through his workup in hospital was dx with early parkinson's. He states has been having more falls recently, dragging his left foot, and now wheelchair bound. He was also treated for similar problem of his right foot/ankle with lesions/swelling but was not as bad as what he has on his left foot has since improved and is better now.   He quit smoking about 2 weeks ago, and using E-cigarettes. He used to smoke 1 ppd. His PCP is Dr. Jeri Cos  Past Medical History  Diagnosis Date  . Arthritis   . Hypertension   . CHF (congestive heart failure)   . Hyperlipidemia   . Coronary artery disease     History reviewed. No pertinent past surgical history.  No family history on file.  History  Substance Use Topics  . Smoking status:  Smoker 1 ppd, using e cig for 2 weeks  . Smokeless tobacco: Current User  . Alcohol Use: No  uses a wheelchair Lives with son   Review of Systems    Constitutional: Negative for fever and chills.  Respiratory: Negative for shortness of breath.   Gastrointestinal: Negative for nausea and vomiting.  Skin:       Swelling and lesions over his left foot. Bruised left great toe.   Neurological: Negative for weakness.  All other systems reviewed and are negative.    Allergies  Review of patient's allergies indicates no known allergies.  Home Medications   Current Outpatient Rx  Name Route Sig Dispense Refill  . ASPIRIN 325 MG PO TABS Oral Take 325 mg by mouth daily.    . ATORVASTATIN CALCIUM 20 MG PO TABS Oral Take 20 mg by mouth daily.    Marland Kitchen CARVEDILOL 12.5 MG PO TABS Oral Take 12.5 mg by mouth 2 (two) times daily with a meal.    . CYCLOBENZAPRINE HCL 10 MG PO TABS Oral Take 10 mg by mouth 3 (three) times daily as needed. For muscle spasms    . DIAZEPAM 2 MG PO TABS Oral Take 2 mg by mouth 3 (three) times daily.    Marland Kitchen DOCUSATE SODIUM 100 MG PO CAPS Oral Take 100 mg by mouth daily.    . DUTASTERIDE 0.5 MG PO CAPS Oral Take 0.5 mg by mouth every evening.    . FUROSEMIDE 40 MG PO TABS Oral Take 40 mg  by mouth at bedtime.    . IBUPROFEN 200 MG PO TABS Oral Take 200 mg by mouth every 6 (six) hours as needed. For pain    . LISINOPRIL 20 MG PO TABS Oral Take 20 mg by mouth daily.    . OXYCODONE-ACETAMINOPHEN 10-325 MG PO TABS Oral Take 1 tablet by mouth 2 (two) times daily as needed. For pain    . SULINDAC 200 MG PO TABS Oral Take 200 mg by mouth 2 (two) times daily.      Triage Vitals: BP 106/71  Pulse 48  Temp 97.8 F (36.6 C) (Oral)  Resp 18  Ht 5\' 8"  (1.727 m)  Wt 220 lb (99.791 kg)  BMI 33.45 kg/m2  SpO2 100%  Vital signs normal except bradycardia   Physical Exam  Nursing note and vitals reviewed. Constitutional: He is oriented to person, place, and time. He appears well-developed and well-nourished.  Non-toxic appearance. He does not appear ill. No distress.  HENT:  Head: Normocephalic and atraumatic.  Nose: No mucosal  edema or rhinorrhea.  Mouth/Throat: Mucous membranes are normal. No dental abscesses or uvula swelling.  Eyes: Conjunctivae normal and EOM are normal. Pupils are equal, round, and reactive to light.  Neck: Normal range of motion and full passive range of motion without pain. Neck supple.  Pulmonary/Chest: Effort normal. No respiratory distress. He has no rhonchi. He exhibits no crepitus.  Abdominal: Normal appearance.  Musculoskeletal: Normal range of motion. He exhibits no edema and no tenderness.       Moves all extremities well.   Neurological: He is alert and oriented to person, place, and time. He has normal strength. No cranial nerve deficit.  Skin: Skin is warm, dry and intact. No rash noted. No erythema. No pallor.       Right leg with hyperpigmentation  of his anterior lower leg also with chronic changes. No swelling of his lower leg. LLE diffusely erythematous just below the knee and his leg has temperature change in the same area getting cooler with his  Left  foot cold to touch. Purple-ish discoloration at tip of left great toe. Moderate swelling over dorsum of left foot to lower leg with scattered superficial ulcers with dark red base on his foot and lower leg. Moisture and whiteness between toes c/w tinea pedis, with dark dried drainage between toes. Superficial abrasion to dorsal tip of second toe. Good right femoral pulse, not feeling left femoral pulse.    Psychiatric: He has a normal mood and affect. His speech is normal and behavior is normal. His mood appears not anxious.    ED Course  Procedures (including critical care time)  Medications  morphine 4 MG/ML injection 4 mg (4 mg Intravenous Given 12/31/11 2004)   DIAGNOSTIC STUDIES: Oxygen Saturation is 100% on room air, normal by my interpretation.    COORDINATION OF CARE: At 530 PM Discussed treatment plan with patient which includes doppler study left foot. Patient agrees.   At change of shift waiting for doppler  studies and xray of foot.   Results for orders placed during the hospital encounter of 12/31/11  CBC WITH DIFFERENTIAL      Component Value Range   WBC 17.8 (*) 4.0 - 10.5 K/uL   RBC 5.12  4.22 - 5.81 MIL/uL   Hemoglobin 14.3  13.0 - 17.0 g/dL   HCT 66.4  40.3 - 47.4 %   MCV 84.0  78.0 - 100.0 fL   MCH 27.9  26.0 - 34.0 pg  MCHC 33.3  30.0 - 36.0 g/dL   RDW 11.9 (*) 14.7 - 82.9 %   Platelets 225  150 - 400 K/uL   Neutrophils Relative 81 (*) 43 - 77 %   Neutro Abs 14.5 (*) 1.7 - 7.7 K/uL   Lymphocytes Relative 12  12 - 46 %   Lymphs Abs 2.2  0.7 - 4.0 K/uL   Monocytes Relative 6  3 - 12 %   Monocytes Absolute 1.1 (*) 0.1 - 1.0 K/uL   Eosinophils Relative 0  0 - 5 %   Eosinophils Absolute 0.0  0.0 - 0.7 K/uL   Basophils Relative 0  0 - 1 %   Basophils Absolute 0.0  0.0 - 0.1 K/uL  COMPREHENSIVE METABOLIC PANEL      Component Value Range   Sodium 136  135 - 145 mEq/L   Potassium 4.6  3.5 - 5.1 mEq/L   Chloride 103  96 - 112 mEq/L   CO2 24  19 - 32 mEq/L   Glucose, Bld 73  70 - 99 mg/dL   BUN 36 (*) 6 - 23 mg/dL   Creatinine, Ser 5.62 (*) 0.50 - 1.35 mg/dL   Calcium 9.0  8.4 - 13.0 mg/dL   Total Protein 7.0  6.0 - 8.3 g/dL   Albumin 2.6 (*) 3.5 - 5.2 g/dL   AST 23  0 - 37 U/L   ALT 38  0 - 53 U/L   Alkaline Phosphatase 88  39 - 117 U/L   Total Bilirubin 0.2 (*) 0.3 - 1.2 mg/dL   GFR calc non Af Amer 42 (*) >90 mL/min   GFR calc Af Amer 49 (*) >90 mL/min  APTT      Component Value Range   aPTT 27  24 - 37 seconds  PROTIME-INR      Component Value Range   Prothrombin Time 14.4  11.6 - 15.2 seconds   INR 1.14  0.00 - 1.49   Laboratory interpretation all normal except leukocytosis, renal insuffic .   No results found.   1. Swelling of joint of left ankle or foot   2. Ulcer of left lower leg    disposition per Dr Rubin Payor   MDM  I personally performed the services described in this documentation, which was scribed in my presence. The recorded information has  been reviewed and considered.  Devoria Albe, MD, [facility]          Ward Givens, MD 12/31/11 [phone]

## 2011-12-31 NOTE — ED Notes (Addendum)
Asked the secretary about calling in the patient for doppler.  Call was placed before 7pm and Vascular tech was aware.  Called Operator and was told that we cannot page or call Vascular tech after 7pm.  Dr. Lynelle Doctor notified.  Per Diplomatic Services operational officer, Vascular tech was notified and stated that she was behind but is aware of patient's needing doppler.

## 2011-12-31 NOTE — ED Notes (Signed)
Sent tech to vascular lab and there was no one there, notified Katie (PA) and charge nurse

## 2011-12-31 NOTE — ED Notes (Signed)
Patient has lesions to his left lower foot that has been there for a month.  States that same lesions appeared on his right lower leg but went away.  Patient also states that two days ago he jammed his foot in his wheelchair.  Swelling and bruising noted to his foot especially his big toe.

## 2011-12-31 NOTE — ED Notes (Signed)
Pt presents with 1 week h/o L foot and ankle pain and swelling.  Pt denies any injury, reports multiple lesions, some of which are draining.  Pt reports numbness to L foot.

## 2012-01-01 ENCOUNTER — Encounter (HOSPITAL_COMMUNITY): Payer: Self-pay | Admitting: *Deleted

## 2012-01-01 DIAGNOSIS — I739 Peripheral vascular disease, unspecified: Secondary | ICD-10-CM

## 2012-01-01 DIAGNOSIS — L98499 Non-pressure chronic ulcer of skin of other sites with unspecified severity: Secondary | ICD-10-CM

## 2012-01-01 LAB — CBC
Hemoglobin: 14.3 g/dL (ref 13.0–17.0)
MCHC: 32.9 g/dL (ref 30.0–36.0)
RBC: 5.2 MIL/uL (ref 4.22–5.81)
WBC: 18.5 10*3/uL — ABNORMAL HIGH (ref 4.0–10.5)

## 2012-01-01 LAB — CREATININE, SERUM
Creatinine, Ser: 1.51 mg/dL — ABNORMAL HIGH (ref 0.50–1.35)
GFR calc Af Amer: 52 mL/min — ABNORMAL LOW (ref >90)
GFR calc non Af Amer: 45 mL/min — ABNORMAL LOW (ref >90)

## 2012-01-01 MED ORDER — DIAZEPAM 2 MG PO TABS
2.0000 mg | ORAL_TABLET | Freq: Three times a day (TID) | ORAL | Status: DC
  Administered 2012-01-01 – 2012-01-02 (×4): 2 mg via ORAL
  Filled 2012-01-01 (×4): qty 1

## 2012-01-01 MED ORDER — PIPERACILLIN-TAZOBACTAM 3.375 G IVPB
3.3750 g | Freq: Three times a day (TID) | INTRAVENOUS | Status: DC
  Administered 2012-01-01 – 2012-01-02 (×5): 3.375 g via INTRAVENOUS
  Filled 2012-01-01 (×7): qty 50

## 2012-01-01 MED ORDER — LISINOPRIL 20 MG PO TABS
20.0000 mg | ORAL_TABLET | Freq: Every day | ORAL | Status: DC
  Administered 2012-01-01: 20 mg via ORAL
  Filled 2012-01-01 (×2): qty 1

## 2012-01-01 MED ORDER — OXYCODONE-ACETAMINOPHEN 10-325 MG PO TABS: 1.0000 | ORAL_TABLET | Freq: Two times a day (BID) | ORAL | Status: DC | PRN

## 2012-01-01 MED ORDER — SODIUM CHLORIDE 0.9 % IJ SOLN
3.0000 mL | Freq: Two times a day (BID) | INTRAMUSCULAR | Status: DC
  Administered 2012-01-01: 3 mL via INTRAVENOUS

## 2012-01-01 MED ORDER — ASPIRIN 325 MG PO TABS
325.0000 mg | ORAL_TABLET | Freq: Every day | ORAL | Status: DC
  Administered 2012-01-01 – 2012-01-10 (×8): 325 mg via ORAL
  Filled 2012-01-01 (×10): qty 1

## 2012-01-01 MED ORDER — ONDANSETRON HCL 4 MG PO TABS
4.0000 mg | ORAL_TABLET | Freq: Four times a day (QID) | ORAL | Status: DC | PRN
  Administered 2012-01-01: 4 mg via ORAL
  Filled 2012-01-01: qty 1

## 2012-01-01 MED ORDER — ATORVASTATIN CALCIUM 20 MG PO TABS
20.0000 mg | ORAL_TABLET | Freq: Every day | ORAL | Status: DC
  Administered 2012-01-01: 20 mg via ORAL
  Filled 2012-01-01 (×2): qty 1

## 2012-01-01 MED ORDER — VANCOMYCIN HCL IN DEXTROSE 1-5 GM/200ML-% IV SOLN
1000.0000 mg | INTRAVENOUS | Status: DC
  Administered 2012-01-01: 1000 mg via INTRAVENOUS
  Filled 2012-01-01 (×2): qty 200

## 2012-01-01 MED ORDER — DUTASTERIDE 0.5 MG PO CAPS
0.5000 mg | ORAL_CAPSULE | Freq: Every evening | ORAL | Status: DC
  Administered 2012-01-01 – 2012-01-10 (×8): 0.5 mg via ORAL
  Filled 2012-01-01 (×10): qty 1

## 2012-01-01 MED ORDER — OXYCODONE HCL 5 MG PO TABS
5.0000 mg | ORAL_TABLET | Freq: Four times a day (QID) | ORAL | Status: DC | PRN
  Administered 2012-01-01 – 2012-01-05 (×5): 5 mg via ORAL
  Filled 2012-01-01 (×6): qty 1

## 2012-01-01 MED ORDER — OXYCODONE-ACETAMINOPHEN 5-325 MG PO TABS
1.0000 | ORAL_TABLET | Freq: Two times a day (BID) | ORAL | Status: DC | PRN
  Administered 2012-01-01 (×2): 1 via ORAL
  Filled 2012-01-01 (×2): qty 1

## 2012-01-01 MED ORDER — OXYCODONE HCL 5 MG PO TABS
5.0000 mg | ORAL_TABLET | Freq: Two times a day (BID) | ORAL | Status: DC | PRN
  Administered 2012-01-01 (×2): 5 mg via ORAL
  Filled 2012-01-01 (×2): qty 1

## 2012-01-01 MED ORDER — INFLUENZA VIRUS VACC SPLIT PF IM SUSP
0.5000 mL | INTRAMUSCULAR | Status: AC
  Administered 2012-01-01: 0.5 mL via INTRAMUSCULAR
  Filled 2012-01-01 (×2): qty 0.5

## 2012-01-01 MED ORDER — FUROSEMIDE 40 MG PO TABS
40.0000 mg | ORAL_TABLET | Freq: Every day | ORAL | Status: DC
  Administered 2012-01-01: 40 mg via ORAL
  Filled 2012-01-01 (×2): qty 1

## 2012-01-01 MED ORDER — SODIUM CHLORIDE 0.9 % IJ SOLN: 3.0000 mL | INTRAMUSCULAR | Status: DC | PRN

## 2012-01-01 MED ORDER — CARVEDILOL 12.5 MG PO TABS
12.5000 mg | ORAL_TABLET | Freq: Two times a day (BID) | ORAL | Status: DC
  Administered 2012-01-01 (×2): 12.5 mg via ORAL
  Filled 2012-01-01 (×5): qty 1

## 2012-01-01 MED ORDER — DOCUSATE SODIUM 100 MG PO CAPS: 100.0000 mg | ORAL_CAPSULE | Freq: Two times a day (BID) | ORAL | Status: DC

## 2012-01-01 MED ORDER — VANCOMYCIN HCL 1000 MG IV SOLR
1500.0000 mg | Freq: Once | INTRAVENOUS | Status: AC
  Administered 2012-01-01: 1500 mg via INTRAVENOUS
  Filled 2012-01-01: qty 1500

## 2012-01-01 MED ORDER — ONDANSETRON HCL 4 MG/2ML IJ SOLN
4.0000 mg | Freq: Four times a day (QID) | INTRAMUSCULAR | Status: DC | PRN
  Administered 2012-01-02: 4 mg via INTRAVENOUS
  Filled 2012-01-01: qty 2

## 2012-01-01 MED ORDER — OXYCODONE-ACETAMINOPHEN 5-325 MG PO TABS
1.0000 | ORAL_TABLET | Freq: Two times a day (BID) | ORAL | Status: DC | PRN
  Administered 2012-01-01 – 2012-01-02 (×2): 1 via ORAL
  Filled 2012-01-01 (×2): qty 1

## 2012-01-01 MED ORDER — PIPERACILLIN-TAZOBACTAM 3.375 G IVPB 30 MIN
3.3750 g | Freq: Three times a day (TID) | INTRAVENOUS | Status: DC
  Filled 2012-01-01 (×2): qty 50

## 2012-01-01 MED ORDER — DOCUSATE SODIUM 100 MG PO CAPS
100.0000 mg | ORAL_CAPSULE | Freq: Every day | ORAL | Status: DC
  Administered 2012-01-01 – 2012-01-10 (×7): 100 mg via ORAL
  Filled 2012-01-01 (×11): qty 1

## 2012-01-01 MED ORDER — SODIUM CHLORIDE 0.9 % IV SOLN
250.0000 mL | INTRAVENOUS | Status: DC | PRN
  Administered 2012-01-01: 250 mL via INTRAVENOUS

## 2012-01-01 MED ORDER — HEPARIN SODIUM (PORCINE) 5000 UNIT/ML IJ SOLN
5000.0000 [IU] | Freq: Three times a day (TID) | INTRAMUSCULAR | Status: DC
  Administered 2012-01-01 – 2012-01-02 (×4): 5000 [IU] via SUBCUTANEOUS
  Filled 2012-01-01 (×7): qty 1

## 2012-01-01 NOTE — H&P (Signed)
[facility] History and Physical  Kevin [name] AVW:[id number] DOB: 1940-09-25    PCP:   Sheila Oats, MD   Chief Complaint: left leg ulcers and pain.  HPI: Kevin [name] is an 71 y.o. male with hx of HTN, CHF, hyperlipidemia,CAD, presents to the ER with 2-3 hx of left leg swelling and multiple ulcers.  He also has increased pain as well.  There has been no fever or chills.  He noted that the toes turned dark and there has been a foul smelling odor to it.  He has been compliant with his meds.  Evaluation in the ER included a WBC of 17.8K, and Cr of 1.59.  Hospitalist was asked to admit patient for foot ischemia and cellulitis.  Rewiew of Systems:  Constitutional: Negative for malaise, fever and chills. No significant weight loss or weight gain Eyes: Negative for eye pain, redness and discharge, diplopia, visual changes, or flashes of light. ENMT: Negative for ear pain, hoarseness, nasal congestion, sinus pressure and sore throat. No headaches; tinnitus, drooling, or problem swallowing. Cardiovascular: Negative for chest pain, palpitations, diaphoresis, dyspnea and peripheral edema. ; No orthopnea, PND Respiratory: Negative for cough, hemoptysis, wheezing and stridor. No pleuritic chestpain. Gastrointestinal: Negative for nausea, vomiting, diarrhea, constipation, abdominal pain, melena, blood in stool, hematemesis, jaundice and rectal bleeding.    Genitourinary: Negative for frequency, dysuria, incontinence,flank pain and hematuria; Musculoskeletal: Negative for back pain and neck pain.  Skin: . Negative for pruritus, rash, abrasions, bruising and skin lesion.; ulcerations Neuro: Negative for headache, lightheadedness and neck stiffness. Negative for weakness, altered level of consciousness , altered mental status, extremity weakness, burning feet, involuntary movement, seizure and syncope.  Psych: negative for anxiety, depression, insomnia, tearfulness, panic attacks,  hallucinations, paranoia, suicidal or homicidal ideation    Past Medical History  Diagnosis Date  . Arthritis   . Hypertension   . CHF (congestive heart failure)   . Hyperlipidemia   . Coronary artery disease     History reviewed. No pertinent past surgical history.  Medications:  HOME MEDS: Prior to Admission medications   Medication Sig Start Date End Date Taking? Authorizing Provider  aspirin 325 MG tablet Take 325 mg by mouth daily.   Yes Historical Provider, MD  atorvastatin (LIPITOR) 20 MG tablet Take 20 mg by mouth daily.   Yes Historical Provider, MD  carvedilol (COREG) 12.5 MG tablet Take 12.5 mg by mouth 2 (two) times daily with a meal.   Yes Historical Provider, MD  cyclobenzaprine (FLEXERIL) 10 MG tablet Take 10 mg by mouth 3 (three) times daily as needed. For muscle spasms   Yes Historical Provider, MD  diazepam (VALIUM) 2 MG tablet Take 2 mg by mouth 3 (three) times daily.   Yes Historical Provider, MD  docusate sodium (COLACE) 100 MG capsule Take 100 mg by mouth daily.   Yes Historical Provider, MD  dutasteride (AVODART) 0.5 MG capsule Take 0.5 mg by mouth every evening.   Yes Historical Provider, MD  furosemide (LASIX) 40 MG tablet Take 40 mg by mouth at bedtime.   Yes Historical Provider, MD  ibuprofen (ADVIL,MOTRIN) 200 MG tablet Take 200 mg by mouth every 6 (six) hours as needed. For pain   Yes Historical Provider, MD  lisinopril (PRINIVIL,ZESTRIL) 20 MG tablet Take 20 mg by mouth daily.   Yes Historical Provider, MD  oxyCODONE-acetaminophen (PERCOCET) 10-325 MG per tablet Take 1 tablet by mouth 2 (two) times daily as needed. For pain   Yes Historical Provider, MD  sulindac (CLINORIL) 200 MG tablet Take 200 mg by mouth 2 (two) times daily.   Yes Historical Provider, MD     Allergies:  No Known Allergies  Social History:   reports that he has quit smoking. He uses smokeless tobacco. He reports that he does not drink alcohol or use illicit drugs.  Family  History: No family history on file.   Physical Exam: Filed Vitals:   12/31/11 1441 12/31/11 1859 12/31/11 2301  BP: 106/71  154/81  Pulse: 48 56 42  Temp: 97.8 F (36.6 C) 97.7 F (36.5 C) 98.5 F (36.9 C)  TempSrc: Oral Oral Oral  Resp: 18 18 18   Height: 5\' 8"  (1.727 m)    Weight: 99.791 kg (220 lb)    SpO2: 100% 99% 94%   Blood pressure 154/81, pulse 42, temperature 98.5 F (36.9 C), temperature source Oral, resp. rate 18, height 5\' 8"  (1.727 m), weight 99.791 kg (220 lb), SpO2 94.00%.  GEN:  Pleasant  patient lying in the stretcher in no acute distress; cooperative with exam. PSYCH:  alert and oriented x4; does not appear anxious or depressed; affect is appropriate. HEENT: Mucous membranes pink and anicteric; PERRLA; EOM intact; no cervical lymphadenopathy nor thyromegaly or carotid bruit; no JVD; There were no stridor. Neck is very supple. Breasts:: Not examined CHEST WALL: No tenderness CHEST: Normal respiration, clear to auscultation bilaterally.  HEART: Regular rate and rhythm.  There are no murmur, rub, or gallops.   BACK: No kyphosis or scoliosis; no CVA tenderness ABDOMEN: soft and non-tender; no masses, no organomegaly, normal abdominal bowel sounds; no pannus; no intertriginous candida. There is no rebound and no distention. Rectal Exam: Not done EXTREMITIES: there is decreased capillary filling of several toes.  There is ischemic changes with cyanotic toes.  No calf tenderness and pulses were not detectable.  The foot is cool to touch. Genitalia: not examined PULSES: 2+ and symmetric SKIN: Normal hydration no rash or ulceration CNS: Cranial nerves 2-12 grossly intact no focal lateralizing neurologic deficit.  Speech is fluent; uvula elevated with phonation, facial symmetry and tongue midline. DTR are normal bilaterally, cerebella exam is intact, barbinski is negative and strengths are equaled bilaterally.  No sensory loss.   Labs on Admission:  Basic Metabolic  Panel:  Lab 12/31/11 1846  NA 136  K 4.6  CL 103  CO2 24  GLUCOSE 73  BUN 36*  CREATININE 1.59*  CALCIUM 9.0  MG --  PHOS --   Liver Function Tests:  Lab 12/31/11 1846  AST 23  ALT 38  ALKPHOS 88  BILITOT 0.2*  PROT 7.0  ALBUMIN 2.6*   No results found for this basename: LIPASE:5,AMYLASE:5 in the last 168 hours No results found for this basename: AMMONIA:5 in the last 168 hours CBC:  Lab 12/31/11 1846  WBC 17.8*  NEUTROABS 14.5*  HGB 14.3  HCT 43.0  MCV 84.0  PLT 225   Cardiac Enzymes: No results found for this basename: CKTOTAL:5,CKMB:5,CKMBINDEX:5,TROPONINI:5 in the last 168 hours  CBG: No results found for this basename: GLUCAP:5 in the last 168 hours   Radiological Exams on Admission: Dg Foot Complete Left  12/31/2011  *RADIOLOGY REPORT*  Clinical Data: foot pain  LEFT FOOT - COMPLETE 3+ VIEW  Comparison: None  Findings: The bones are diffusely osteopenic.  There is mild diffuse soft tissue swelling.  Plantar are and posterior calcaneal heel spurs noted.  Degenerative type changes noted involving the tibiotalar joint.  No fracture or subluxation noted.  IMPRESSION:  1.  No acute bony abnormalities.   Original Report Authenticated By: Rosealee Albee, M.D.        Assessment/Plan Present on Admission:  .Ischemic leg .HTN (hypertension) Hyperlipidemia Tobacco abuse.  PLAN:  This patient is having an ischemic left leg and is in need of urgent vascular consultation.  I will admit him and give pain meds along with IV Van/ Zosyn.  EDP has consulted Dr Darrick Penna.  Further investigation is deferred to vascular Sx.  Will continue his ASA and home meds.  Dr Darrick Penna did not think full anticoagulation is warranted.  He is stable, full code, and will be admitted to [facility] service.   Other plans as per orders.  Code Status: FULL Unk Lightning, MD. [facility] Pager [phone] 7pm to 7am.  01/01/2012, 12:16 AM

## 2012-01-01 NOTE — Progress Notes (Signed)
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 But most likely he will need amputation  Fabienne Bruns, MD [facility] [place] Office: [phone] Pager: [phone]

## 2012-01-01 NOTE — Progress Notes (Signed)
[facility]                                                                                Patient Demographics  Kevin [name], is a 71 y.o. male  YNW:[id number]  MVH:[id number]  DOB - 04-May-1940  Admit date - 12/31/2011  Admitting Physician Houston Siren, MD  Outpatient Primary MD for the patient is DEFAULT,PROVIDER, MD  LOS - 1   Chief Complaint  Patient presents with  . Foot Pain        Assessment & Plan    1. PAD with L.Ischemic Ulcers on both legs worse on the left side, to necrosis in early stages in the left foot - mild cellulitis in the left foot, ABS pending, empiric Vanco and Zosyn, vascular surgery following the patient, agree with aspirin and statin which will be continued, may need surgery shortly. Agent has been explained clearly that he will be a high risk candidate for any operative procedure in terms of cardiopulmonary complications. He understands and accepts the fact.    2. Chronic systolic CHF which is compensated. Last EF was 20% a few years ago in the system. Tight fluid monitoring, continue beta blocker and ACE as tolerated.    3. CAD, dyslipidemia, hypertension and tobacco abuse - currently chest pain-free, continue beta blocker, aspirin, statin along with ACE inhibitor, blood pressure stable, outpatient followup with cardiologist and primary care physician upon discharge.    4. Renal insufficiency unclear whether this is CK D. stage IV acute renal failure. He is on ACE inhibitor and creatinine is stable x2, will repeat BMP in the morning, we'll try to get records from PCP office, no labs in our system from before.      Code Status: Full  Family Communication: Discussed with the patient  Disposition Plan: TBD    Procedures ABIs   Consults  Vas Surg   Time Spent in minutes   35   Antibiotics     Anti-infectives     Start     Dose/Rate Route Frequency Ordered Stop   01/01/12 1200   vancomycin (VANCOCIN) IVPB 1000  mg/200 mL premix        1,000 mg 200 mL/hr over 60 Minutes Intravenous Every 24 hours 01/01/12 0158     01/01/12 0230   vancomycin (VANCOCIN) 1,500 mg in sodium chloride 0.9 % 500 mL IVPB        1,500 mg 250 mL/hr over 120 Minutes Intravenous  Once 01/01/12 0158 01/01/12 0436   01/01/12 0215  piperacillin-tazobactam (ZOSYN) IVPB 3.375 g       3.375 g 12.5 mL/hr over 240 Minutes Intravenous Every 8 hours 01/01/12 0201     01/01/12 0200   piperacillin-tazobactam (ZOSYN) IVPB 3.375 g  Status:  Discontinued        3.375 g 100 mL/hr over 30 Minutes Intravenous Every 8 hours 01/01/12 0149 01/01/12 0201          Scheduled Meds:   . aspirin  325 mg Oral Daily  . atorvastatin  20 mg Oral Daily  . carvedilol  12.5 mg Oral BID WC  . diazepam  2 mg Oral Q8H  . docusate sodium  100 mg Oral Daily  . dutasteride  0.5 mg Oral QPM  . furosemide  40 mg Oral Daily  . heparin  5,000 Units Subcutaneous Q8H  . influenza  inactive virus vaccine  0.5 mL Intramuscular Tomorrow-1000  . lisinopril  20 mg Oral Daily  .  morphine injection  4 mg Intravenous Once  . piperacillin-tazobactam (ZOSYN)  IV  3.375 g Intravenous Q8H  . sodium chloride  3 mL Intravenous Q12H  . vancomycin  1,500 mg Intravenous Once  . vancomycin  1,000 mg Intravenous Q24H  . DISCONTD: docusate sodium  100 mg Oral BID  . DISCONTD: piperacillin-tazobactam  3.375 g Intravenous Q8H   Continuous Infusions:  PRN Meds:.sodium chloride, ondansetron (ZOFRAN) IV, ondansetron, oxyCODONE, oxyCODONE-acetaminophen, sodium chloride, DISCONTD: oxyCODONE-acetaminophen   DVT Prophylaxis   Heparin   Lab Results  Component Value Date   PLT 216 01/01/2012      Susa Raring K M.D on 01/01/2012 at 10:52 AM  Between 7am to 7pm - Pager - [phone]  After 7pm go to [email or web address] - password [email or web address]  And look for the night coverage person covering for me after hours  [facility] Office  [phone]    Subjective:    Kevin [name] today has, No headache, No chest pain, No abdominal pain - No Nausea, No new weakness tingling or numbness, No Cough - SOB.   Objective:   Filed Vitals:   12/31/11 2301 01/01/12 0106 01/01/12 0150 01/01/12 0549  BP: 154/81 96/63 164/89 124/70  Pulse: 42 53 68 65  Temp: 98.5 F (36.9 C) 98.2 F (36.8 C) 97.7 F (36.5 C) 98 F (36.7 C)  TempSrc: Oral Oral Oral Oral  Resp: 18 18 18 16   Height:      Weight:      SpO2: 94% 99% 99% 99%    Wt Readings from Last 3 Encounters:  12/31/11 99.791 kg (220 lb)     Intake/Output Summary (Last 24 hours) at 01/01/12 1052 Last data filed at 01/01/12 0200  Gross per 24 hour  Intake      0 ml  Output    300 ml  Net   -300 ml    Exam Awake Alert, Oriented X 3, No new F.N deficits, Normal affect .AT,PERRAL Supple Neck,No JVD, No cervical lymphadenopathy appriciated.  Symmetrical Chest wall movement, Good air movement bilaterally, CTAB RRR,No Gallops,Rubs or new Murmurs, No Parasternal Heave +ve B.Sounds, Abd Soft, Non tender, No organomegaly appriciated, No rebound - guarding or rigidity. No Cyanosis, Clubbing or edema, left leg appears to be colder than the right, no hair on both lower legs, dry gangrene in the left big toe and second and third toe, also some dry ischemic ulcers left leg more than right, some cellulitis in the left foot.   Data Review   Micro Results No results found for this or any previous visit (from the past 240 hour(s)).  Radiology Reports Dg Foot Complete Left  12/31/2011  *RADIOLOGY REPORT*  Clinical Data: foot pain  LEFT FOOT - COMPLETE 3+ VIEW  Comparison: None  Findings: The bones are diffusely osteopenic.  There is mild diffuse soft tissue swelling.  Plantar are and posterior calcaneal heel spurs noted.  Degenerative type changes noted involving the tibiotalar joint.  No fracture or subluxation noted.  IMPRESSION:  1.  No acute bony abnormalities.   Original Report Authenticated By:  Rosealee Albee, M.D.     CBC  Lab 01/01/12 0210 12/31/11 1846  WBC 18.5* 17.8*  HGB  14.3 14.3  HCT 43.5 43.0  PLT 216 225  MCV 83.7 84.0  MCH 27.5 27.9  MCHC 32.9 33.3  RDW 16.2* 16.2*  LYMPHSABS -- 2.2  MONOABS -- 1.1*  EOSABS -- 0.0  BASOSABS -- 0.0  BANDABS -- --    Chemistries   Lab 01/01/12 0210 12/31/11 1846  NA -- 136  K -- 4.6  CL -- 103  CO2 -- 24  GLUCOSE -- 73  BUN -- 36*  CREATININE 1.51* 1.59*  CALCIUM -- 9.0  MG -- --  AST -- 23  ALT -- 38  ALKPHOS -- 88  BILITOT -- 0.2*   ------------------------------------------------------------------------------------------------------------------ estimated creatinine clearance is 51.4 ml/min (by C-G formula based on Cr of 1.51). ------------------------------------------------------------------------------------------------------------------ No results found for this basename: HGBA1C:2 in the last 72 hours ------------------------------------------------------------------------------------------------------------------ No results found for this basename: CHOL:2,HDL:2,LDLCALC:2,TRIG:2,CHOLHDL:2,LDLDIRECT:2 in the last 72 hours ------------------------------------------------------------------------------------------------------------------ No results found for this basename: TSH,T4TOTAL,FREET3,T3FREE,THYROIDAB in the last 72 hours ------------------------------------------------------------------------------------------------------------------ No results found for this basename: VITAMINB12:2,FOLATE:2,FERRITIN:2,TIBC:2,IRON:2,RETICCTPCT:2 in the last 72 hours  Coagulation profile  Lab 12/31/11 1846  INR 1.14  PROTIME --    No results found for this basename: DDIMER:2 in the last 72 hours  Cardiac Enzymes No results found for this basename: CK:3,CKMB:3,TROPONINI:3,MYOGLOBIN:3 in the last 168  hours ------------------------------------------------------------------------------------------------------------------ No components found with this basename: POCBNP:3

## 2012-01-01 NOTE — Progress Notes (Signed)
ANTIBIOTIC CONSULT NOTE - INITIAL  Pharmacy Consult for vancomycin  Indication: Ischemic leg   No Known Allergies  Patient Measurements: Height: 5\' 8"  (172.7 cm) Weight: 220 lb (99.791 kg) IBW/kg (Calculated) : 68.4   Vital Signs: Temp: 97.7 F (36.5 C) (10/19 0150) Temp src: Oral (10/19 0150) BP: 164/89 mmHg (10/19 0150) Pulse Rate: 68  (10/19 0150) Intake/Output from previous day:   Intake/Output from this shift:    Labs:  Basename 12/31/11 1846  WBC 17.8*  HGB 14.3  PLT 225  LABCREA --  CREATININE 1.59*   Estimated Creatinine Clearance: 48.8 ml/min (by C-G formula based on Cr of 1.59). No results found for this basename: VANCOTROUGH:2,VANCOPEAK:2,VANCORANDOM:2,GENTTROUGH:2,GENTPEAK:2,GENTRANDOM:2,TOBRATROUGH:2,TOBRAPEAK:2,TOBRARND:2,AMIKACINPEAK:2,AMIKACINTROU:2,AMIKACIN:2, in the last 72 hours   Microbiology: No results found for this or any previous visit (from the past 720 hour(s)).  Medical History: Past Medical History  Diagnosis Date  . Arthritis   . Hypertension   . CHF (congestive heart failure)   . Hyperlipidemia   . Coronary artery disease     Medications:  Scheduled:    . aspirin  325 mg Oral Daily  . atorvastatin  20 mg Oral Daily  . carvedilol  12.5 mg Oral BID WC  . diazepam  2 mg Oral Q8H  . docusate sodium  100 mg Oral Daily  . docusate sodium  100 mg Oral BID  . dutasteride  0.5 mg Oral QPM  . furosemide  40 mg Oral Daily  . heparin  5,000 Units Subcutaneous Q8H  . lisinopril  20 mg Oral Daily  .  morphine injection  4 mg Intravenous Once  . piperacillin-tazobactam  3.375 g Intravenous Q8H  . sodium chloride  3 mL Intravenous Q12H   Assessment: 71 yo male admitted with ischemic left leg. Pharmacy consulted to manage vancomycin. Patient is also to receive Zosyn.   Goal of Therapy:  Vancomycin trough 10-20 mcg/mL  Plan:  1. Vancomycin 1.5gm IV x 1, then vancomycin 1gm IV Q24H.  Thad Ranger, Mellody Drown 01/01/2012,1:53  AM

## 2012-01-01 NOTE — Consult Note (Addendum)
[facility] [place] HISTORY AND PHYSICAL   History of Present Illness:  Patient is a 71 y.o. year old male who presents for evaluation of left leg ulcers. The patient is non ambulatory and has been wheelchair bound for almost 6 weeks secondary to degenerative arthritis in his back.  He states he injured his left leg on the wheelchair a few weeks ago.  He has had numbness in the foot for several weeks.  The left first toe has started to turn black.  Other medical problems include CHF EF 20%, HTN, arthritis, hyperlipidemia, CAD.  Does not know if he has atrial fibrillation.  Past Medical History  Diagnosis Date  . Arthritis   . Hypertension   . CHF (congestive heart failure)   . Hyperlipidemia   . Coronary artery disease     Past Surgical History  Procedure Date  . No past surgeries      Social History History  Substance Use Topics  . Smoking status: Former Games developer  . Smokeless tobacco: Current User  . Alcohol Use: No    Family History History reviewed. No pertinent family history.  Allergies  No Known Allergies   Current Facility-Administered Medications  Medication Dose Route Frequency Provider Last Rate Last Dose  . 0.9 %  sodium chloride infusion  250 mL Intravenous PRN Houston Siren, MD 10 mL/hr at 01/01/12 0235 250 mL at 01/01/12 0235  . aspirin tablet 325 mg  325 mg Oral Daily Houston Siren, MD      . atorvastatin (LIPITOR) tablet 20 mg  20 mg Oral Daily Houston Siren, MD      . carvedilol (COREG) tablet 12.5 mg  12.5 mg Oral BID WC Houston Siren, MD      . diazepam (VALIUM) tablet 2 mg  2 mg Oral Q8H Houston Siren, MD      . docusate sodium (COLACE) capsule 100 mg  100 mg Oral Daily Houston Siren, MD      . dutasteride (AVODART) capsule 0.5 mg  0.5 mg Oral QPM Houston Siren, MD      . furosemide (LASIX) tablet 40 mg  40 mg Oral Daily Houston Siren, MD      . heparin injection 5,000 Units  5,000 Units Subcutaneous Q8H Houston Siren, MD      . influenza  inactive virus vaccine  (FLUZONE/FLUARIX) injection 0.5 mL  0.5 mL Intramuscular Tomorrow-1000 Houston Siren, MD      . lisinopril (PRINIVIL,ZESTRIL) tablet 20 mg  20 mg Oral Daily Houston Siren, MD      . morphine 4 MG/ML injection 4 mg  4 mg Intravenous Once Ward Givens, MD   4 mg at 12/31/11 2004  . ondansetron (ZOFRAN) tablet 4 mg  4 mg Oral Q6H PRN Houston Siren, MD       Or  . ondansetron [facility]) injection 4 mg  4 mg Intravenous Q6H PRN Houston Siren, MD      . oxyCODONE-acetaminophen (PERCOCET/ROXICET) 5-325 MG per tablet 1 tablet  1 tablet Oral BID PRN Houston Siren, MD   1 tablet at 01/01/12 0244   And  . oxyCODONE (Oxy IR/ROXICODONE) immediate release tablet 5 mg  5 mg Oral BID PRN Houston Siren, MD   5 mg at 01/01/12 0244  . piperacillin-tazobactam (ZOSYN) IVPB 3.375 g  3.375 g Intravenous Q8H Houston Siren, MD   3.375 g at 01/01/12 0241  . sodium chloride 0.9 % injection 3 mL  3 mL Intravenous Q12H Houston Siren, MD  3 mL at 01/01/12 0249  . sodium chloride 0.9 % injection 3 mL  3 mL Intravenous PRN Houston Siren, MD      . vancomycin (VANCOCIN) 1,500 mg in sodium chloride 0.9 % 500 mL IVPB  1,500 mg Intravenous Once Houston Siren, MD   1,500 mg at 01/01/12 0236  . vancomycin (VANCOCIN) IVPB 1000 mg/200 mL premix  1,000 mg Intravenous Q24H Houston Siren, MD      . DISCONTD: docusate sodium (COLACE) capsule 100 mg  100 mg Oral BID Houston Siren, MD      . DISCONTD: oxyCODONE-acetaminophen (PERCOCET) 10-325 MG per tablet 1 tablet  1 tablet Oral BID PRN Houston Siren, MD      . DISCONTD: piperacillin-tazobactam (ZOSYN) IVPB 3.375 g  3.375 g Intravenous Q8H Houston Siren, MD        ROS:   General:  No weight loss, Fever, chills  HEENT: No recent headaches, no nasal bleeding, no visual changes, no sore throat  Neurologic: No dizziness, blackouts, seizures. No recent symptoms of stroke or mini- stroke. No recent episodes of slurred speech, or temporary blindness.  Cardiac: No recent episodes of chest pain/pressure, no shortness of breath at rest.  + shortness of breath with  exertion.   Vascular: No history of rest pain in feet.  No history of claudication.  No history of non-healing ulcer, No history of DVT   Pulmonary: No home oxygen, no productive cough, no hemoptysis,  No asthma or wheezing  Musculoskeletal:  [x ] Arthritis, [x ] Low back pain,  [x ] Joint pain  Hematologic:No history of hypercoagulable state.  No history of easy bleeding.  No history of anemia  Gastrointestinal: No hematochezia or melena,  No gastroesophageal reflux, no trouble swallowing  Urinary: [ ]  chronic Kidney disease, [ ]  on HD - [ ]  MWF or [ ]  TTHS, [ ]  Burning with urination, [ ]  Frequent urination, [ ]  Difficulty urinating;   Skin: No rashes  Psychological: No history of anxiety,  No history of depression   Physical Examination  Filed Vitals:   12/31/11 1859 12/31/11 2301 01/01/12 0106 01/01/12 0150  BP:  154/81 96/63 164/89  Pulse: 56 42 53 68  Temp: 97.7 F (36.5 C) 98.5 F (36.9 C) 98.2 F (36.8 C) 97.7 F (36.5 C)  TempSrc: Oral Oral Oral Oral  Resp: 18 18 18 18   Height:      Weight:      SpO2: 99% 94% 99% 99%    Body mass index is 33.45 kg/(m^2).  General:  Alert and oriented, no acute distress HEENT: Normal Neck: No bruit or JVD Pulmonary: Clear to auscultation bilaterally Cardiac: Irregular Rate and Rhythm Abdomen: Soft, non-tender, non-distended, no mass, no scars Skin: No rash, multiple 2-3 cm superficial ulcers scattered over left foot and distal calf, early erythema to mid calf,  Extremity Pulses:  2+ radial, brachial, femoral bilateral 1+ dorsalis pedis right absent dorsalis pedis and posterior tibial pulses left Musculoskeletal: No deformity left foot edematous early gangrene left first toe  Neurologic: Upper and lower extremity motor 5/5 and symmetric  DATA:  CBC    Component Value Date/Time   WBC 18.5* 01/01/2012 0210   RBC 5.20 01/01/2012 0210   HGB 14.3 01/01/2012 0210   HCT 43.5 01/01/2012 0210   PLT 216 01/01/2012 0210   MCV  83.7 01/01/2012 0210   MCH 27.5 01/01/2012 0210   MCHC 32.9 01/01/2012 0210   RDW 16.2* 01/01/2012 0210   LYMPHSABS 2.2 12/31/2011  1846   MONOABS 1.1* 12/31/2011 1846   EOSABS 0.0 12/31/2011 1846   BASOSABS 0.0 12/31/2011 1846    BMET    Component Value Date/Time   NA 136 12/31/2011 1846   K 4.6 12/31/2011 1846   CL 103 12/31/2011 1846   CO2 24 12/31/2011 1846   GLUCOSE 73 12/31/2011 1846   BUN 36* 12/31/2011 1846   CREATININE 1.51* 01/01/2012 0210   CALCIUM 9.0 12/31/2011 1846   GFRNONAA 45* 01/01/2012 0210   GFRAA 52* 01/01/2012 0210    ASSESSMENT: Severe ischemia left foot which at this point may have already infarcted.  Now several weeks into this.   PLAN:  Will recheck on Sunday if continues to deteriorate will need AKA vs BKA if stabilizes will consider agram on Monday and possible PTA/ stent.  Pt is not candidate for bypass due to his overall debiliated state Will check EKG to rule out Afib. Agree with checking ABI  Fabienne Bruns, MD [facility] [place] Office: [phone] Pager: [phone]

## 2012-01-01 NOTE — Progress Notes (Signed)
UR completed 

## 2012-01-02 ENCOUNTER — Inpatient Hospital Stay (HOSPITAL_COMMUNITY): Payer: Medicare Other

## 2012-01-02 ENCOUNTER — Encounter (HOSPITAL_COMMUNITY): Payer: Self-pay | Admitting: Physician Assistant

## 2012-01-02 DIAGNOSIS — I509 Heart failure, unspecified: Secondary | ICD-10-CM

## 2012-01-02 DIAGNOSIS — A419 Sepsis, unspecified organism: Secondary | ICD-10-CM | POA: Diagnosis present

## 2012-01-02 DIAGNOSIS — R6521 Severe sepsis with septic shock: Secondary | ICD-10-CM | POA: Diagnosis present

## 2012-01-02 DIAGNOSIS — I50810 Right heart failure, unspecified: Secondary | ICD-10-CM | POA: Diagnosis not present

## 2012-01-02 DIAGNOSIS — R6520 Severe sepsis without septic shock: Secondary | ICD-10-CM | POA: Diagnosis present

## 2012-01-02 DIAGNOSIS — I959 Hypotension, unspecified: Secondary | ICD-10-CM

## 2012-01-02 DIAGNOSIS — R0989 Other specified symptoms and signs involving the circulatory and respiratory systems: Secondary | ICD-10-CM

## 2012-01-02 DIAGNOSIS — I999 Unspecified disorder of circulatory system: Secondary | ICD-10-CM

## 2012-01-02 LAB — URINALYSIS, ROUTINE W REFLEX MICROSCOPIC
Bilirubin Urine: NEGATIVE
Nitrite: NEGATIVE
Specific Gravity, Urine: 1.016 (ref 1.005–1.030)
Urobilinogen, UA: 0.2 mg/dL (ref 0.0–1.0)
pH: 5 (ref 5.0–8.0)

## 2012-01-02 LAB — MRSA PCR SCREENING: MRSA by PCR: NEGATIVE

## 2012-01-02 LAB — BASIC METABOLIC PANEL
BUN: 54 mg/dL — ABNORMAL HIGH (ref 6–23)
CO2: 20 mEq/L (ref 19–32)
Calcium: 8.8 mg/dL (ref 8.4–10.5)
Calcium: 7.8 mg/dL — ABNORMAL LOW (ref 8.4–10.5)
Creatinine, Ser: 2.91 mg/dL — ABNORMAL HIGH (ref 0.50–1.35)
GFR calc non Af Amer: 17 mL/min — ABNORMAL LOW (ref >90)
GFR calc non Af Amer: 20 mL/min — ABNORMAL LOW (ref >90)
Glucose, Bld: 181 mg/dL — ABNORMAL HIGH (ref 70–99)
Potassium: 5.4 mEq/L — ABNORMAL HIGH (ref 3.5–5.1)
Sodium: 134 mEq/L — ABNORMAL LOW (ref 135–145)
Sodium: 136 mEq/L (ref 135–145)

## 2012-01-02 LAB — EXPECTORATED SPUTUM ASSESSMENT W GRAM STAIN, RFLX TO RESP C

## 2012-01-02 LAB — GLUCOSE, CAPILLARY: Glucose-Capillary: 150 mg/dL — ABNORMAL HIGH (ref 70–99)

## 2012-01-02 LAB — CARBOXYHEMOGLOBIN
Carboxyhemoglobin: 0.5 % (ref 0.5–1.5)
Methemoglobin: 1.1 % (ref 0.0–1.5)
O2 Saturation: 36.2 %
Total hemoglobin: 13.2 g/dL — ABNORMAL LOW (ref 13.5–18.0)

## 2012-01-02 LAB — CBC
MCH: 27.7 pg (ref 26.0–34.0)
Platelets: 183 10*3/uL (ref 150–400)
RBC: 5.05 MIL/uL (ref 4.22–5.81)
WBC: 19.3 10*3/uL — ABNORMAL HIGH (ref 4.0–10.5)

## 2012-01-02 LAB — CREATININE, URINE, RANDOM: Creatinine, Urine: 85.1 mg/dL

## 2012-01-02 LAB — LACTIC ACID, PLASMA
Lactic Acid, Venous: 2.2 mmol/L (ref 0.5–2.2)
Lactic Acid, Venous: 1.6 mmol/L (ref 0.5–2.2)

## 2012-01-02 LAB — TYPE AND SCREEN: ABO/RH(D): AB NEG

## 2012-01-02 LAB — ABO/RH: ABO/RH(D): AB NEG

## 2012-01-02 LAB — SODIUM, URINE, RANDOM: Sodium, Ur: 81 mEq/L

## 2012-01-02 MED ORDER — HEPARIN BOLUS VIA INFUSION
4000.0000 [IU] | Freq: Once | INTRAVENOUS | Status: AC
  Administered 2012-01-02: 4000 [IU] via INTRAVENOUS
  Filled 2012-01-02: qty 4000

## 2012-01-02 MED ORDER — NOREPINEPHRINE BITARTRATE 1 MG/ML IJ SOLN
2.0000 ug/min | INTRAVENOUS | Status: DC
  Administered 2012-01-02: 5 ug/min via INTRAVENOUS
  Filled 2012-01-02: qty 4

## 2012-01-02 MED ORDER — SODIUM POLYSTYRENE SULFONATE 15 GM/60ML PO SUSP
30.0000 g | Freq: Once | ORAL | Status: AC
  Administered 2012-01-02: 30 g via ORAL
  Filled 2012-01-02: qty 120

## 2012-01-02 MED ORDER — VANCOMYCIN HCL IN DEXTROSE 1-5 GM/200ML-% IV SOLN: 1000.0000 mg | Freq: Once | INTRAVENOUS | Status: DC

## 2012-01-02 MED ORDER — LISINOPRIL 5 MG PO TABS
5.0000 mg | ORAL_TABLET | Freq: Every day | ORAL | Status: DC
  Filled 2012-01-02: qty 1

## 2012-01-02 MED ORDER — DOPAMINE-DEXTROSE 3.2-5 MG/ML-% IV SOLN
2.0000 ug/kg/min | INTRAVENOUS | Status: DC
  Administered 2012-01-02: 5 ug/kg/min via INTRAVENOUS
  Administered 2012-01-03: 8 ug/kg/min via INTRAVENOUS
  Filled 2012-01-02 (×2): qty 250

## 2012-01-02 MED ORDER — DOBUTAMINE IN D5W 4-5 MG/ML-% IV SOLN
5.0000 ug/kg/min | INTRAVENOUS | Status: DC
  Filled 2012-01-02: qty 250

## 2012-01-02 MED ORDER — CARVEDILOL 6.25 MG PO TABS
6.2500 mg | ORAL_TABLET | Freq: Once | ORAL | Status: DC
  Filled 2012-01-02: qty 1

## 2012-01-02 MED ORDER — HEPARIN (PORCINE) IN NACL 100-0.45 UNIT/ML-% IJ SOLN
1200.0000 [IU]/h | INTRAMUSCULAR | Status: DC
  Administered 2012-01-02 – 2012-01-03 (×2): 1350 [IU]/h via INTRAVENOUS
  Administered 2012-01-04: 1050 [IU]/h via INTRAVENOUS
  Administered 2012-01-04 – 2012-01-05 (×2): 1200 [IU]/h via INTRAVENOUS
  Filled 2012-01-02 (×6): qty 250

## 2012-01-02 MED ORDER — DOBUTAMINE-DEXTROSE 2-5 MG/ML-% IV SOLN
5.0000 ug/kg/min | INTRAVENOUS | Status: DC
  Filled 2012-01-02 (×4): qty 250

## 2012-01-02 MED ORDER — FUROSEMIDE 40 MG PO TABS: 40.0000 mg | ORAL_TABLET | Freq: Every day | ORAL | Status: DC

## 2012-01-02 MED ORDER — SODIUM CHLORIDE 0.9 % IV SOLN
250.0000 mL | INTRAVENOUS | Status: DC | PRN
  Administered 2012-01-02: 09:00:00 via INTRAVENOUS

## 2012-01-02 MED ORDER — SODIUM CHLORIDE 0.9 % IV SOLN: 250.0000 mL | INTRAVENOUS | Status: DC | PRN

## 2012-01-02 MED ORDER — SODIUM CHLORIDE 0.9 % IV BOLUS (SEPSIS)
500.0000 mL | Freq: Once | INTRAVENOUS | Status: AC
  Administered 2012-01-02: 500 mL via INTRAVENOUS

## 2012-01-02 MED ORDER — SODIUM CHLORIDE 0.9 % IV SOLN
INTRAVENOUS | Status: DC
Start: 2012-01-02 — End: 2012-01-03
  Administered 2012-01-02: 15:00:00 via INTRAVENOUS

## 2012-01-02 MED ORDER — PIPERACILLIN-TAZOBACTAM IN DEX 2-0.25 GM/50ML IV SOLN
2.2500 g | Freq: Four times a day (QID) | INTRAVENOUS | Status: DC
  Administered 2012-01-02 – 2012-01-03 (×4): 2.25 g via INTRAVENOUS
  Filled 2012-01-02 (×5): qty 50

## 2012-01-02 MED ORDER — CARVEDILOL 6.25 MG PO TABS
6.2500 mg | ORAL_TABLET | Freq: Two times a day (BID) | ORAL | Status: DC
  Filled 2012-01-02 (×2): qty 1

## 2012-01-02 MED ORDER — ASPIRIN 81 MG PO CHEW
CHEWABLE_TABLET | ORAL | Status: AC
  Administered 2012-01-02: 324 mg
  Filled 2012-01-02: qty 4

## 2012-01-02 MED ORDER — VANCOMYCIN HCL 1000 MG IV SOLR
750.0000 mg | INTRAVENOUS | Status: DC
  Filled 2012-01-02: qty 750

## 2012-01-02 NOTE — Progress Notes (Signed)
ANTIBIOTIC CONSULT NOTE - Follow-up  Pharmacy Consult for vancomycin  Indication: Ischemic leg   No Known Allergies  Patient Measurements: Height: 5\' 8"  (172.7 cm) Weight: 216 lb 4.8 oz (98.113 kg) IBW/kg (Calculated) : 68.4   Vital Signs: Temp: 97.5 F (36.4 C) (10/20 0610) Temp src: Oral (10/20 0224) BP: 93/66 mmHg (10/20 0827) Pulse Rate: 80  (10/20 0827) Intake/Output from previous day: 10/19 0701 - 10/20 0700 In: 800 [P.O.:600; I.V.:200] Out: 1100 [Urine:1100] Intake/Output from this shift:    Labs:  Basename 01/02/12 0715 01/01/12 0210 12/31/11 1846  WBC 19.3* 18.5* 17.8*  HGB 14.0 14.3 14.3  PLT 183 216 225  LABCREA -- -- --  CREATININE 3.35* 1.51* 1.59*   Estimated Creatinine Clearance: 23 ml/min (by C-G formula based on Cr of 3.35). No results found for this basename: VANCOTROUGH:2,VANCOPEAK:2,VANCORANDOM:2,GENTTROUGH:2,GENTPEAK:2,GENTRANDOM:2,TOBRATROUGH:2,TOBRAPEAK:2,TOBRARND:2,AMIKACINPEAK:2,AMIKACINTROU:2,AMIKACIN:2, in the last 72 hours   Microbiology: No results found for this or any previous visit (from the past 720 hour(s)).  Assessment: 71 yo male admitted with ischemic left leg. Pharmacy consulted to manage vancomycin. Patient is also to receive Zosyn. Pts remains afebrile but WBC is trending up to 19.3 today. Pts Scr is also rapidly worsening and now up to 3.35 with estimated CrCl ~50ml/min. Zosyn dose remains appropriate.   Goal of Therapy:  Vancomycin trough 10-20 mcg/mL  Plan:  1. Change vancomycin to 750mg  IV Q24H 2. Continue current zosyn dose - 3.375gm IV Q8H (4hr infusion) 3. F/u renal fxn, C&S, clinical status and trough at [facility]  Myleka Moncure, Drake Leach 01/02/2012,8:47 AM

## 2012-01-02 NOTE — Progress Notes (Addendum)
ANTIBIOTIC CONSULT NOTE - Follow-up  Pharmacy Consult for vancomycin/heparin Indication: Ischemic leg/R/o PE   No Known Allergies  Patient Measurements: Height: 5\' 8"  (172.7 cm) Weight: 216 lb 4.8 oz (98.113 kg) IBW/kg (Calculated) : 68.4   Vital Signs: Temp: 98.2 F (36.8 C) (10/20 1207) Temp src: Oral (10/20 1207) BP: 93/52 mmHg (10/20 1245) Pulse Rate: 78  (10/20 1245) Intake/Output from previous day: 10/19 0701 - 10/20 0700 In: 1000 [P.O.:600; I.V.:200; IV Piggyback:200] Out: 1100 [Urine:1100] Intake/Output from this shift: Total I/O In: 782.5 [P.O.:200; I.V.:532.5; IV Piggyback:50] Out: 325 [Urine:325]  Labs:  Basename 01/02/12 1200 01/02/12 0715 01/01/12 0210 12/31/11 1846  WBC -- 19.3* 18.5* 17.8*  HGB -- 14.0 14.3 14.3  PLT -- 183 216 225  LABCREA 85.10 -- -- --  CREATININE -- 3.35* 1.51* 1.59*   Estimated Creatinine Clearance: 23 ml/min (by C-G formula based on Cr of 3.35). No results found for this basename: VANCOTROUGH:2,VANCOPEAK:2,VANCORANDOM:2,GENTTROUGH:2,GENTPEAK:2,GENTRANDOM:2,TOBRATROUGH:2,TOBRAPEAK:2,TOBRARND:2,AMIKACINPEAK:2,AMIKACINTROU:2,AMIKACIN:2, in the last 72 hours   Microbiology: Recent Results (from the past 720 hour(s))  MRSA PCR SCREENING     Status: Normal   Collection Time   01/02/12 10:22 AM      Component Value Range Status Comment   MRSA by PCR NEGATIVE  NEGATIVE Final     Assessment: 71 yo male admitted with ischemic left leg. Pharmacy consulted to manage vancomycin. Patient is also to receive Zosyn. Pt received a total of 2.5g of vanc load yesterday. His scr rapidly risen this AM.  I think he still has plenty of vanc in his system due to his ARF. It was also suspected that he may have a PE but cards said unlikely since his respiratory symptoms seem resolved. IV heparin will be started to r/o PE for now.  Goal of Therapy:  Vancomycin trough 10-20 mcg/mL Heparin level = 0.3-0.7 Plan:  1. Hold vanc  2. Change zosyn dose -  2.25gm IV Q6 3. Check random level in AM 4. Heparin 4000 units bolus x1 5. Heparin drip at 1350 units/hr 6. Check 6hr heparin level  Ulyses Southward [place] 01/02/2012,1:10 PM

## 2012-01-02 NOTE — Procedures (Signed)
Central Venous Catheter Insertion Procedure Note JERAD GARDIPEE [id number] Mar 19, 1940  Procedure: Insertion of Central Venous Catheter Indications: Assessment of intravascular volume, Drug and/or fluid administration and Frequent blood sampling  Procedure Details Consent: Risks of procedure as well as the alternatives and risks of each were explained to the (patient/caregiver).  Consent for procedure obtained. Time Out: Verified patient identification, verified procedure, site/side was marked, verified correct patient position, special equipment/implants available, medications/allergies/relevent history reviewed, required imaging and test results available.  Performed  Maximum sterile technique was used including antiseptics, cap, gloves, gown, hand hygiene, mask and sheet. Skin prep: Chlorhexidine; local anesthetic administered A antimicrobial bonded/coated triple lumen catheter was placed in the right internal jugular vein using the Seldinger technique. Ultrasound guidance used.yes Catheter placed to 17 cm. Blood aspirated via all 3 ports and then flushed x 3. Line sutured x 2 and dressing applied.  Evaluation Blood flow good Complications: No apparent complications Patient did tolerate procedure well. Chest X-ray ordered to verify placement.  CXR: normal.  Brett Canales Minor ACNP [facility] PCCM Pager [phone] till 3 pm If no answer page [phone] 01/02/2012, 11:22 AM   I was present for this procedure.    Dorcas Carrow Beeper  [phone]  Cell  [phone]  If no response or cell goes to voicemail, call beeper [phone]

## 2012-01-02 NOTE — Significant Event (Signed)
Rapid Response Event Note  Overview: Called to see patient at 0815 for soft BP (reported 65 systolic)      Initial Focused Assessment: Patient has dusky skin color. Left leg and foot swollen with lesions on left foot. Patient alert and oriented to place and time. No complaints of dizzyness, lightheadedness. Audible blood pressure obtained by charge nurse in l arm of 90/60, History-admitted Friday 10/18, elevated WBC and Creat, vascular work up. EF 20%. Baseline PVCs. Now getting 500 cc fluid bolus. WBC elevated yesterday from admission levels.  yesterday.   Interventions: Physician at bedside. Medication adjustment and surveillance.    Event Summary:   Patient left on 6N at  0845    RN Called with order for transfer at  0855   Assisted with transfer to 2100 at 0920    Joelene Millin RN

## 2012-01-02 NOTE — Progress Notes (Signed)
Events of the last 24 hr noted, hypotension renal failure.  Filed Vitals:   01/02/12 0900 01/02/12 0923 01/02/12 1000 01/02/12 1015  BP: 77/52 84/72 69/50 90/68  Pulse: 61  89 39  Temp:      TempSrc:      Resp:   19 18  Height:      Weight:      SpO2:   100% 98%   Left lower extremity unchanged appearance, cool, dusky, no doppler signals ABI on right is normal  Assessment: Due to decline in status pt not a candidate for agram or any revascularization.  He needs a left AKA.  Will schedule for tomorrow pending his clinical status.  His leg could be the source of hypotension but does not really have the look of significant infection but could be related to muscle necrosis and rhabdo.  Plan: NPO p midnight.  Left AKA tomorrow  Kevin Connaughton, MD [facility]  Office: [phone] Pager: [phone]  

## 2012-01-02 NOTE — Progress Notes (Signed)
  Echocardiogram 2D Echocardiogram has been performed.  Kevin [name] 01/02/2012, 10:32 AM

## 2012-01-02 NOTE — Consult Note (Signed)
Requesting Physician:  Dr. Thedore Mins Reason for Consult:  Acute renal failure in the setting of ischemic leg, hypotension, possible sepsis, ACE Primary Care Physician: Dr. Jeri Cos  HPI: The patient is a 71 y.o. year-old with PMH significant for hypertension (lisinopril and carvedilol), cardiomyopathy with non obstructive CAD by cath (EF at one time as low as 10, with more recent echo today showing EF of 50-55% but now with RV dysfunction), BPH (on avodart and flomax) and chronic arthritis (on clinoril and ibuprofen).    He was admitted with cellulitis/leg ulcers that started back in the spring, and was found to have an ischemic gangrenous foot.  He was transferred to the ICU earlier today because of the development of hypotension with blood pressures into the 60's.  He had been on furosemide, carvedilol and lisinopril all of which were discontinued.  He is now under the care of CCM, and after fluid resuscitation and addition of norepinephrine most recent BP is up to 100/67 and CVP via newly placed central line is 11. He will have left [facility] on 10/21 pending his clinical status  We are asked to see him because of a doubling in his serum creatinine from 1.59 on admission to 3.35 today. A foley was placed earlier today after bladder scan showed 400 ml, with immediate drainage of 325 ml of urine   The only available creatinine data from the EPIC system are as follows:  Creatinine, Ser  Date/Time Value Range Status  01/02/2012  7:15 AM 3.35* 0.50 - 1.35 mg/dL Final  78/29/5621  3:08 AM 1.51* 0.50 - 1.35 mg/dL Final  65/78/4696  2:95 PM 1.59* 0.50 - 1.35 mg/dL Final  He states that he gets labs at least once/year by Dr. Bascom Levels but can't recall if any done recently.  Past Medical History  Diagnosis Date  . Arthritis   . Hypertension   . CHF (congestive heart failure)     a. NICM/acute CHF 2007 with EF 10-20%. b. Improved EF 50-55% by echo 12/2011.  Marland Kitchen Hyperlipidemia   . Coronary artery disease      a. NSTEMI 2007 with cath showing mild nonobstructive CAD by cath 2007 (40% mLAD, 40%mRCA) at time of dx of NICM.  Marland Kitchen Tortuous aorta     a. Noted by cath 2007, recommendation for OP [place].  Marland Kitchen BPH (benign prostatic hyperplasia)   . Wide-complex tachycardia     a. Noted 2007.  . Obesity   . Valvular heart disease     a. Mod MR by echo 06/2005 but no significant MR 2013.   Past Surgical History  Procedure Date  . No past surgeries    Family History  Problem Relation Age of Onset  . Heart attack Mother     In her 82's  . Stroke Mother     In her 30's  . CAD Brother    Social History:  reports that he has quit smoking. He uses smokeless tobacco. He reports that he does not drink alcohol or use illicit drugs. 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  Married twice.  Second wife died earlier this year.    Allergies: No Known Allergies  Home medications: Prior to Admission medications   Medication Sig Start Date End Date Taking? Authorizing Provider  aspirin 325 MG tablet Take 325 mg by mouth daily.   Yes Historical Provider, MD  atorvastatin (LIPITOR) 20 MG tablet Take 20 mg by mouth daily.  Yes Historical Provider, MD  carvedilol (COREG) 12.5 MG tablet Take 12.5 mg by mouth 2 (two) times daily with a meal.   Yes Historical Provider, MD  cyclobenzaprine (FLEXERIL) 10 MG tablet Take 10 mg by mouth 3 (three) times daily as needed. For muscle spasms   Yes Historical Provider, MD  diazepam (VALIUM) 2 MG tablet Take 2 mg by mouth 3 (three) times daily.   Yes Historical Provider, MD  docusate sodium (COLACE) 100 MG capsule Take 100 mg by mouth daily.   Yes Historical Provider, MD  dutasteride (AVODART) 0.5 MG capsule Take 0.5 mg by mouth every evening.   Yes Historical Provider, MD  furosemide (LASIX) 40 MG tablet Take 40 mg by mouth at bedtime.   Yes Historical Provider, MD  ibuprofen (ADVIL,MOTRIN) 200 MG tablet Take 200 mg by mouth every 6  (six) hours as needed. For pain   Yes Historical Provider, MD  lisinopril (PRINIVIL,ZESTRIL) 20 MG tablet Take 20 mg by mouth daily.   Yes Historical Provider, MD  oxyCODONE-acetaminophen (PERCOCET) 10-325 MG per tablet Take 1 tablet by mouth 2 (two) times daily as needed. For pain   Yes Historical Provider, MD  sulindac (CLINORIL) 200 MG tablet Take 200 mg by mouth 2 (two) times daily.   Yes Historical Provider, MD    Inpatient medications and infusions: . aspirin      . aspirin  325 mg Oral Daily  . docusate sodium  100 mg Oral Daily  . dutasteride  0.5 mg Oral QPM  . heparin  5,000 Units Subcutaneous Q8H  . piperacillin-tazobactam (ZOSYN)  IV  3.375 g Intravenous Q8H  . sodium chloride  500 mL Intravenous Once  . sodium polystyrene  30 g Oral Once  . DISCONTD: atorvastatin  20 mg Oral Daily  . DISCONTD: carvedilol  12.5 mg Oral BID WC  . DISCONTD: carvedilol  6.25 mg Oral BID WC  . DISCONTD: carvedilol  6.25 mg Oral ONCE-1800  . DISCONTD: diazepam  2 mg Oral Q8H  . DISCONTD: furosemide  40 mg Oral Daily  . DISCONTD: furosemide  40 mg Oral Daily  . DISCONTD: lisinopril  20 mg Oral Daily  . DISCONTD: lisinopril  5 mg Oral Daily  . DISCONTD: sodium chloride  3 mL Intravenous Q12H  . DISCONTD: vancomycin  750 mg Intravenous Q24H  . DISCONTD: vancomycin  1,000 mg Intravenous Q24H  . DISCONTD: vancomycin  1,000 mg Intravenous Once   . sodium chloride 75 mL/hr at 01/02/12 1318  . norepinephrine (LEVOPHED) Adult infusion 5 mcg/min (01/02/12 1345)   Review of Systems No chest pain or dyspnea Chronic ulcerations on his left leg since the spring, with claudication in the left leg preceding the ulcers H/O BPH symptoms - takes meds for this - has to Push to void Some occasional edema in the legs No nausea, vomiting or abdominal pain No bleeding issues O/w negative   Physical Examination: BP 100/67  Pulse 78  Temp 98.2 F (36.8 C) (Oral)  Resp 15  Ht 5\' 8"  (1.727 m)  Wt 98.113  kg (216 lb 4.8 oz)  BMI 32.89 kg/m2  SpO2 98% Very nice middle aged man NAD on nasal oxygen Awake and alert and able to provide historical information Lung fields grossly clear Left leg with superficial skin loss, bluish discoloration mid tib area, some mottling/discoloration to the knee. Left first toe is dark.  Leg is cool the the knee.  Right leg with old healed ulcerations, no sig edema Foley draining  clear urine  Labs: Basic Metabolic Panel:  Lab 01/02/12 8657 01/01/12 0210 12/31/11 1846  NA 134* -- 136  K 5.4* -- 4.6  CL 99 -- 103  CO2 20 -- 24  GLUCOSE 151* -- 73  BUN 55* -- 36*  CREATININE 3.35* 1.51* 1.59*  ALB -- -- --  CALCIUM 8.8 -- 9.0  PHOS -- -- --   Liver Function Tests:  Lab 12/31/11 1846  AST 23  ALT 38  ALKPHOS 88  BILITOT 0.2*  PROT 7.0  ALBUMIN 2.6*   Lab 01/02/12 0715 01/01/12 0210 12/31/11 1846  WBC 19.3* 18.5* 17.8*  NEUTROABS -- -- 14.5*  HGB 14.0 14.3 14.3  HCT 41.6 43.5 43.0  MCV 82.4 83.7 84.0  PLT 183 216 225   Lab 01/02/12 1130  CKTOTAL --  CKMB --  CKMBINDEX --  TROPONINI 1.02*   No results found for this basename: CKTOTAL  CBG:  Lab 01/02/12 0953  GLUCAP 150*   Results for KHILAN, GILLISON (MRN [id number]) as of 01/02/2012 14:28  Ref. Range 01/02/2012 08:26 01/02/2012 11:30  Lactic Acid, Venous Latest Range: 0.5-2.2 mmol/L 2.2 1.6   Results for ROSEVELT, FLATLEY (MRN [id number]) as of 01/02/2012 14:28  Ref. Range 01/02/2012 12:00  Color, Urine Latest Range: YELLOW  YELLOW  APPearance Latest Range: CLEAR  CLOUDY (A)  Specific Gravity, Urine Latest Range: 1.005-1.030  1.016  pH Latest Range: 5.0-8.0  5.0  Glucose Latest Range: NEGATIVE mg/dL NEGATIVE  Bilirubin Urine Latest Range: NEGATIVE  NEGATIVE  Ketones, ur Latest Range: NEGATIVE mg/dL NEGATIVE  Protein Latest Range: NEGATIVE mg/dL NEGATIVE  Urobilinogen, UA Latest Range: 0.0-1.0 mg/dL 0.2  Nitrite Latest Range: NEGATIVE  NEGATIVE  Leukocytes, UA Latest Range:  NEGATIVE  SMALL (A)  Hgb urine dipstick Latest Range: NEGATIVE  LARGE (A)  Urine-Other No range found AMORPHOUS URATES/PHOSPHATES  WBC, UA Latest Range: <3 WBC/hpf 0-2  RBC / HPF Latest Range: <3 RBC/hpf 11-20  Sodium, Ur No range found 81  Creatinine, Urine No range found 85.10   Xrays/Other Studies: Dg Chest Port 1 View  01/02/2012  *RADIOLOGY REPORT*  Clinical Data: Central line placement  PORTABLE CHEST - 1 VIEW  Comparison: 11/27/2011  Findings: Right jugular catheter tip in the mid SVC.  No pneumothorax.  Lungs are clear without infiltrate or effusion. Negative for heart failure.  IMPRESSION: Satisfactory central venous catheter placement.   Original Report Authenticated By: Camelia Phenes, M.D.    Dg Foot Complete Left  12/31/2011  *RADIOLOGY REPORT*  Clinical Data: foot pain  LEFT FOOT - COMPLETE 3+ VIEW  Comparison: None  Findings: The bones are diffusely osteopenic.  There is mild diffuse soft tissue swelling.  Plantar are and posterior calcaneal heel spurs noted.  Degenerative type changes noted involving the tibiotalar joint.  No fracture or subluxation noted.  IMPRESSION:  1.  No acute bony abnormalities.   Original Report Authenticated By: Rosealee Albee, M.D.     Assessment/Recommendations  71 year old [place] male with prior h/o HTN, NICM, BPH, chronic arthritis with  1. AKI in the setting of ischemic leg, hypotension with ACE on board and use of NSAIDS (clinoril and ibuprofen) up to the time of admission.   May have been an element of outflow obstruction as well with moderate volume of urine in the bladder at the time of foley placement with prior h/o BPH. Could have element of rhabdo from tissue necrosis - no CPK data available Mild hyperkalemia will need  f/u labs  Agree with current management including fluid resuscitation, pressor support, removal of nephrotoxin agents, antibiotics for possible sepsis.  No indication at this time for initiation of renal  replacement therapy.  We will follow and be available if worsening renal failure or electrolyte issues that would require.   2. Ischemic left leg - for possible AKA 10/21 by Dr. Darrick Penna 3. Hypotension (with h/o HTN) meds d/c'd 4. NICM LVEF 50-55 5. RV dysfunction - newly recognized - per Dr. Johney Frame  Thanks for consult. Will follow with you.  Camille Bal,  MD [facility] [phone] pager 01/02/2012, 2:17 PM

## 2012-01-02 NOTE — Consult Note (Addendum)
CARDIOLOGY CONSULT NOTE  Patient ID: Kevin [name], MRN: [id number], DOB/AGE: [birth year] 72 y.o. Admit date: 12/31/2011   Date of Consult: 01/02/2012 Primary Physician: Sheila Oats, MD Primary Cardiologist: Dr. Gala Romney remotely in consult  Chief Complaint: foot pain, dark toes Reason for Consult: hypotension, RV dysfunction on echo (prior LV dysfunction but EF now 50-55%)  HPI: 71 y/o M with hx of HTN, probable CKD, HTN who was evaluated by cardiology in 2007 for acute CHF/NSTEMI found to have mild nonobstructive CAD/tortuous aorta by cath and NICM EF 10-20% at that time, but is 50-55% by echo today. He has been wheelchair bound for almost 6 weeks due to L leg pain and back arthritis. He presented to [facility] overnight 10/18 with complaints of 2 months weeks of left leg swelling, multiple ulcers, pain, darkened toes and foul odor. No fever or chills. He was found to have an ischemic, cellulitic, gangrenous foot. Vascular has seen the patient and does not want to proceed with angiogram due to renal insufficiency but is concerned he may need amputation. He was started on broad spectrum antibiotics. The patient developed signs concerning for sepsis this morning including persistently elevated WBC 19.3, acute on chronic renal insufficiency (Cr 1.59->3.35) with hyperkalemia, and hypotension with SBP down to 64/50. Lactic acid 2.2. Blood cx, UA are pending. Lasix was discontinued. CXR pending. He was given 500cc bolus NS and maintenance at 50cc/hr and pressure is 90/68. 2D echo does show severely dilated RV with severely reduced systolic function and PA pressure , which raises concern for PE. EKG NSR frequent PVCs nonspecific ST-T changes, with today's tracing with less lateral ST depression than yesterday. Tele shows frequent ventricular ectopy, occasional ventricular escape beats. 1st troponin 1.02.  He denies any CP, SOB, or syncope prior to admission but does not do much. He  endorses an episode of chest pain last night around 7pm lasting 45 minutes not associated with any other symptoms. He reports being placed on oxygen with gradual relief. His only complaint at present is L leg pain. No dizziness.  Past Medical History  Diagnosis Date  . Arthritis   . Hypertension   . CHF (congestive heart failure)     a. NICM/acute CHF 2007 with EF 10-20%. b. Improved EF 50-55% by echo 12/2011.  Marland Kitchen Hyperlipidemia   . Coronary artery disease     a. NSTEMI 2007 with cath showing mild nonobstructive CAD by cath 2007 (40% mLAD, 40%mRCA) at time of dx of NICM.  Marland Kitchen Tortuous aorta     a. Noted by cath 2007, recommendation for OP [place].  Marland Kitchen BPH (benign prostatic hyperplasia)   . Wide-complex tachycardia     a. Noted 2007.  . Obesity   . Valvular heart disease     a. Mod MR by echo 06/2005 but no significant MR 2013.      Most Recent Cardiac Studies: 2D Echo 06/2005 SUMMARY - The left ventricle was mildly dilated. Overall left ventricular systolic function was markedly decreased. Left ventricular ejection fraction was estimated , range being 10 % to 20 %. There was severe diffuse left ventricular hypokinesis. - There was moderate mitral valvular regurgitation. - The left atrium was mildly dilated. - The estimated peak pulmonary artery systolic pressure was moderately increased. - The right atrium was mildly dilated.  Cardiac Cath 06/2005 PROCEDURE:  1. Left heart catheterization.  2. Left ventriculography.  3. Coronary angiography.  4. StarClose closure of the right common femoral arteriotomy site.  INDICATIONS: Mr. Holz  is a 71 year old gentleman without prior history  of cardiovascular disease. However, risk factors include diabetes mellitus  and hypertension. He presents with approximately two months of progressive  dyspnea culminating in severe resting dyspnea. On admission troponins were  mildly elevated. He is referred for diagnostic angiography.  PROCEDURAL  TECHNIQUE: Informed consent was obtained. Under 1% lidocaine  local anesthesia a 5-French sheath was placed in the right common femoral  artery using the modified Seldinger technique. Diagnostic angiography and  ventriculography were performed using JL4, JR4, and pigtail catheters. The  arteriotomy was then closed using a StarClose device. Complete hemostasis  was obtained. He was then transferred to the holding room in stable  condition having tolerated the procedure well.  COMPLICATIONS: None.  FINDINGS:  1. LV 131/19/26. EF approximately 20% with global hypokinesis. There is  no aortic stenosis. There is 1+ mitral regurgitation  2. Left main: Angiographically normal.  3. LAD: Large vessel wrapping the apex of the heart and giving rise to  moderate sized diagonals. The mid LAD has a 40% stenosis at the origin  of the first diagonal. The remainder is free of significant disease.  4. Circumflex: Fairly large vessel giving rise to three obtuse marginals.  It is angiographically normal.  5. RCA: Moderate-sized dominant vessel. There is a 40% stenosis of the  mid vessel.  IMPRESSION/RECOMMENDATIONS:  1. Severe non-ischemic cardiomyopathy.  2. Mild non-obstructive coronary disease.  3. Moderately elevated left ventricular end-diastolic pressure.  Will plan on continued medical therapy for his heart failure. Will continue  his Coreg. Will plan on adding ACE inhibitor tomorrow once his IV dye has  cleared. Wall ultimately need to consider ICD implantation.  Of note, his distal thoracic aorta and abdominal aorta were markedly  tortuous raising concern for aneurysm. Outpatient ultrasound will be  arranged.   Surgical History:  Past Surgical History  Procedure Date  . No past surgeries      Home Meds: Prior to Admission medications   Medication Sig Start Date End Date Taking? Authorizing Provider  aspirin 325 MG tablet Take 325 mg by mouth daily.   Yes Historical Provider, MD    atorvastatin (LIPITOR) 20 MG tablet Take 20 mg by mouth daily.   Yes Historical Provider, MD  carvedilol (COREG) 12.5 MG tablet Take 12.5 mg by mouth 2 (two) times daily with a meal.   Yes Historical Provider, MD  cyclobenzaprine (FLEXERIL) 10 MG tablet Take 10 mg by mouth 3 (three) times daily as needed. For muscle spasms   Yes Historical Provider, MD  diazepam (VALIUM) 2 MG tablet Take 2 mg by mouth 3 (three) times daily.   Yes Historical Provider, MD  docusate sodium (COLACE) 100 MG capsule Take 100 mg by mouth daily.   Yes Historical Provider, MD  dutasteride (AVODART) 0.5 MG capsule Take 0.5 mg by mouth every evening.   Yes Historical Provider, MD  furosemide (LASIX) 40 MG tablet Take 40 mg by mouth at bedtime.   Yes Historical Provider, MD  ibuprofen (ADVIL,MOTRIN) 200 MG tablet Take 200 mg by mouth every 6 (six) hours as needed. For pain   Yes Historical Provider, MD  lisinopril (PRINIVIL,ZESTRIL) 20 MG tablet Take 20 mg by mouth daily.   Yes Historical Provider, MD  oxyCODONE-acetaminophen (PERCOCET) 10-325 MG per tablet Take 1 tablet by mouth 2 (two) times daily as needed. For pain   Yes Historical Provider, MD  sulindac (CLINORIL) 200 MG tablet Take 200 mg by mouth 2 (two) times daily.  Yes Historical Provider, MD    Inpatient Medications:     . aspirin  325 mg Oral Daily  . docusate sodium  100 mg Oral Daily  . dutasteride  0.5 mg Oral QPM  . heparin  5,000 Units Subcutaneous Q8H  . influenza  inactive virus vaccine  0.5 mL Intramuscular Tomorrow-1000  . piperacillin-tazobactam (ZOSYN)  IV  3.375 g Intravenous Q8H  . sodium chloride  500 mL Intravenous Once  . sodium chloride  3 mL Intravenous Q12H  . sodium polystyrene  30 g Oral Once  . vancomycin  750 mg Intravenous Q24H  . vancomycin  1,000 mg Intravenous Once  . DISCONTD: atorvastatin  20 mg Oral Daily  . DISCONTD: carvedilol  12.5 mg Oral BID WC  . DISCONTD: carvedilol  6.25 mg Oral BID WC  . DISCONTD: carvedilol   6.25 mg Oral ONCE-1800  . DISCONTD: diazepam  2 mg Oral Q8H  . DISCONTD: furosemide  40 mg Oral Daily  . DISCONTD: furosemide  40 mg Oral Daily  . DISCONTD: lisinopril  20 mg Oral Daily  . DISCONTD: lisinopril  5 mg Oral Daily  . DISCONTD: vancomycin  1,000 mg Intravenous Q24H    Allergies: No Known Allergies  History   Social History  . Marital Status: Legally Separated    Spouse Name: N/A    Number of Children: N/A  . Years of Education: N/A   Occupational History  . Not on file.   Social History Main Topics  . Smoking status: Former Games developer  . Smokeless tobacco: Current User  . Alcohol Use: No  . Drug Use: No  . Sexually Active:    Other Topics Concern  . Not on file   Social History Narrative  . No narrative on file     Family History  Problem Relation Age of Onset  . Heart attack Mother     In her 60's  . Stroke Mother     In her 78's  . CAD Brother      Review of Systems: General: negative for chills, fever, night sweats Cardiovascular: see above. No palpitations Dermatological: negative for rash, but leg skin changes as above Respiratory: negative for wheezing or cough Urologic: negative for hematuria Abdominal: negative for nausea, vomiting, diarrhea, bright red blood per rectum, melena, or hematemesis Neurologic: negative for visual changes, syncope, or dizziness All other systems reviewed and are otherwise negative except as noted above.  Labs:  Lab Results  Component Value Date   WBC 19.3* 01/02/2012   HGB 14.0 01/02/2012   HCT 41.6 01/02/2012   MCV 82.4 01/02/2012   PLT 183 01/02/2012     Lab 01/02/12 0715 12/31/11 1846  NA 134* --  K 5.4* --  CL 99 --  CO2 20 --  BUN 55* --  CREATININE 3.35* --  CALCIUM 8.8 --  PROT -- 7.0  BILITOT -- 0.2*  ALKPHOS -- 88  ALT -- 38  AST -- 23  GLUCOSE 151* --    Radiology/Studies:  Foot Complete Left 12/31/2011  *RADIOLOGY REPORT*  Clinical Data: foot pain  LEFT FOOT - COMPLETE 3+ VIEW   Comparison: None  Findings: The bones are diffusely osteopenic.  There is mild diffuse soft tissue swelling.  Plantar are and posterior calcaneal heel spurs noted.  Degenerative type changes noted involving the tibiotalar joint.  No fracture or subluxation noted.  IMPRESSION:  1.  No acute bony abnormalities.   Original Report Authenticated By: Rosealee Albee, M.D.  EKG:  01/02/12: NSR 84bpm with frequent PVCs, NSIVCD, nonspecific ST-T changes but less ST depression than prior 01/01/12: NSR 69bpm with PVCs, ventricular escape beats, lateral ST-T changes I, avL, V5-V6  Physical Exam: Blood pressure 80/56, pulse 83, temperature 97.5 F (36.4 C), temperature source Oral, resp. rate 16, height 5\' 8"  (1.727 m), weight 216 lb 4.8 oz (98.113 kg), SpO2 99.00%. General: Chronically ill appearing M in no acute distress. Head: Normocephalic, atraumatic, sclera non-icteric, no xanthomas, nares are without discharge.  Neck: Negative for carotid bruits. JVD not elevated. Lungs: Clear bilaterally to auscultation without wheezes, rales, or rhonchi. Breathing is unlabored. Heart: RRR with S1 S2 occasional ectopy. No murmurs, rubs, or gallops appreciated. Abdomen: Soft, non-tender, non-distended with normoactive bowel sounds. No hepatomegaly. No rebound/guarding. No obvious abdominal masses. Msk:  Strength and tone appear normal for age. Extremities: No clubbing or cyanosis. RLE - no edema, 2+ pedal pulse. LLE - dusky, darkened toes and numerous areas of ulcerations over his foot and shin with notable lack of pedal pulse.  Neuro: Alert and oriented X 3. No facial asymmetry. No focal deficit. Moves all extremities spontaneously. Psych:  Responds to questions appropriately with a somewhat flat affect.   Assessment and Plan:   1. Ischemic L limb - per vascular/IM. 2. Hypotension with concern for developing sepsis/other acute process 3. Cardiomyopathy: h/o prior LV dysfunction 2007 with improved EF 50-55% by  echo today, but significant RV dysfunction raising concern for PE 4. NSTEMI (troponin 1.02) with mild nonobstructive CAD by cath 2007  5. Tortuous aorta by cath 2007 6. Acute on likely chronic renal failure  Signed, Dayna Dunn PA-C 01/02/2012, 11:23 AM   I have seen, examined the patient, and reviewed the above assessment and plan.  Changes to above are made where necessary.  Pt is presently resting in bed, without complaints of CP or SOB.  On exam, lungs are clear and heart is regular but with ectopy.  He has severe skin changes with vascular disease noted.  Presently, he remains hypotensive.  Sats are 100%.  Echo reveals very large and poorly contracting RV with RA enlargement noted also.  PA pressures only modestly elevated though.  LV is small (due to enlarged RV) and LV EF is felt to be about 50% by Dr Eldridge Dace. I have spoken with Dr Delford Field.  We agree to proceed with a VQ scan to evaluate for large PTE, though clinically, the patient does not have respiratory symptoms to correspond. We cannot obtain CTA due to renal failure.   I think that IVF (CVP is presently 8) and dopamine may be helpful.  He did have some ST depression on EKG yesterday though this is presently improved.  + Troponin.  Would therefore place on heparin gtt.  Would delay CV ischemic workup until creatinine is improve.  We will follow closely with you.  Co Sign: Hillis Range, MD 01/02/2012 2:06 PM

## 2012-01-02 NOTE — Progress Notes (Signed)
Bladder Scan completed by this RN, greater than 400 ml noted on the scan.  Patient is unable to void since arrival on the unit. I will contact Brett Canales Minor NP for further instructions.

## 2012-01-02 NOTE — Progress Notes (Signed)
Unable to reach any of his family members listed and other numbers provided by patient

## 2012-01-02 NOTE — Progress Notes (Signed)
On am rounds , bp was low76/64 64/50 HR 70-80 irregular.  Pt alert oriented and has no complaints.  Rapid response nurse asked to help, MD notified.  Bolus given,placed on tele monitor, MD came to check patient. Will continue to monitor closely

## 2012-01-02 NOTE — Consult Note (Signed)
Name: Kevin [name] MRN: [id number] DOB: [birth year]    LOS: 2 Requesting MD: [facility] Regarding:HJypotension in setting of lower extremity ischemia and cellulitis.     History of Present Illness: 71 yo male with a hx of HTN, CRI(unknown base creatine but 1.59 on 10-18), chronic chf with reduced EF of 20%. He presented to [facility] ED 10-18 with left great toe gangrene , ischemic of lower extremities and evaluated by VVS. 10-19 while on floor he had hypotension and was transferred to ICU with presumed sepsis, increased  renal dysfunction and PCCM was asked to assume his care 10-19.  Lines / Drains: 10-20 Left Rt J>>  Cultures: 10-20 bc x 2>> 10-20 UC>>  Antibiotics: 10-18 vanc>> 10-18 zoysn>>  Tests / Events:   Subjective: Awake and follows commands Past Medical History  Diagnosis Date  . Arthritis   . Hypertension   . CHF (congestive heart failure)   . Hyperlipidemia   . Coronary artery disease    Past Surgical History  Procedure Date  . No past surgeries    Prior to Admission medications   Medication Sig Start Date End Date Taking? Authorizing Provider  aspirin 325 MG tablet Take 325 mg by mouth daily.   Yes Historical Provider, MD  atorvastatin (LIPITOR) 20 MG tablet Take 20 mg by mouth daily.   Yes Historical Provider, MD  carvedilol (COREG) 12.5 MG tablet Take 12.5 mg by mouth 2 (two) times daily with a meal.   Yes Historical Provider, MD  cyclobenzaprine (FLEXERIL) 10 MG tablet Take 10 mg by mouth 3 (three) times daily as needed. For muscle spasms   Yes Historical Provider, MD  diazepam (VALIUM) 2 MG tablet Take 2 mg by mouth 3 (three) times daily.   Yes Historical Provider, MD  docusate sodium (COLACE) 100 MG capsule Take 100 mg by mouth daily.   Yes Historical Provider, MD  dutasteride (AVODART) 0.5 MG capsule Take  0.5 mg by mouth every evening.   Yes Historical Provider, MD  furosemide (LASIX) 40 MG tablet Take 40 mg by mouth at bedtime.   Yes Historical Provider, MD  ibuprofen (ADVIL,MOTRIN) 200 MG tablet Take 200 mg by mouth every 6 (six) hours as needed. For pain   Yes Historical Provider, MD  lisinopril (PRINIVIL,ZESTRIL) 20 MG tablet Take 20 mg by mouth daily.   Yes Historical Provider, MD  oxyCODONE-acetaminophen (PERCOCET) 10-325 MG per tablet Take 1 tablet by mouth 2 (two) times daily as needed. For pain   Yes Historical Provider, MD  sulindac (CLINORIL) 200 MG tablet Take 200 mg by mouth 2 (two) times daily.   Yes Historical Provider, MD   Allergies No Known Allergies  Family History History reviewed. No pertinent family history.  Social History  reports that he has quit smoking. He uses smokeless tobacco. He reports that he does not drink alcohol or use  illicit drugs. He quit smoking 3 weeks prior to admit Review Of Systems  11 points review of systems is negative with an exception of listed in HPI.  Vital Signs: Temp:  [97.2 F (36.2 C)-97.8 F (36.6 C)] 97.5 F (36.4 C) (10/20 0610) Pulse Rate:  [48-109] 61  (10/20 0900) Resp:  [17-20] 17  (10/20 0610) BP: (64-119)/(50-85) 84/72 mmHg (10/20 0923) SpO2:  [91 %-97 %] 91 % (10/20 0610) Weight:  [98.113 kg (216 lb 4.8 oz)] 98.113 kg (216 lb 4.8 oz) (10/20 0505) I/O last 3 completed shifts: In: 800 [P.O.:600; I.V.:200] Out: 1400 [Urine:1400]  Physical Examination: General:  Elderly male male Neuro:  Intact, mae x 4, affect dull HEENT:  No LAN, Oral mucosa unremakable. No JVD Cardiovascular:  hsd rrr Lungs: decreased bs bases Abdomen:  +BS Musculoskeletal:  Intact Skin:  LOWER EXT COOL. UCLERS ON BOTH FEET. LEGT GREAT TOE GREAT TOE BLACK.  [place] oxygen    Labs and Imaging:   Lab 01/02/12 0715 01/01/12 0210 12/31/11 1846  NA 134* -- 136  K 5.4* -- 4.6  CL 99 -- 103  CO2 20 -- 24  BUN 55* -- 36*  CREATININE 3.35* 1.51*  1.59*  GLUCOSE 151* -- 73    Lab 01/02/12 0715 01/01/12 0210 12/31/11 1846  HGB 14.0 14.3 14.3  HCT 41.6 43.5 43.0  WBC 19.3* 18.5* 17.8*  PLT 183 216 225   ABG No results found for this basename: phart, pco2, pco2art, po2, po2art, hco3, tco2, acidbasedef, o2sat    Assessment and Plan: Sepsis and septic shock A: Profound hypotension in setting of presumed sepsis from gangrenous left toe and lower extremity ischemia.  No results found for this basename: PROCALCITON:4 in the last 168 hours   P: -place cvl 10-20 -sepsis protocol (limited due to chf and low EF)10-20  -abx per ID section -surgical intervention when stable -Pressors and fluids per protocol  Renal: Lab Results  Component Value Date   CREATININE 3.35* 01/02/2012   CREATININE 1.51* 01/01/2012   CREATININE 1.59* 12/31/2011    Lab 01/02/12 0715 12/31/11 1846  K 5.4* 4.6     A: CRI with base creatine 1.5 range Hyperkalemia P: -gentle hydration -cvl to guide fluids -dc nephrotoxins -may need renal consults -kayexalate    ID: A: Gangrene of Left great toe and lower ext cellulitis.   P: -See Id sections -Check 2D echo -Ultimately needs surgical debridement.  Cardiac:  A:Hx of CAD and CHF EKG with incomplete rbb, nad P: -Check 2 d done -Cards consult done -Cardiac enzymes for completeness.     Best practices / Disposition: -->ICU status under PCCM -->full code -->Heparin for DVT Px -->Protonix for GI Px -->diet low na -->family not avaiable  CC40 Caryl Bis  [phone]  Cell  [phone]  If no response or cell goes to voicemail, call beeper [phone] 01/02/2012, 10:11 AM

## 2012-01-02 NOTE — Progress Notes (Signed)
VASCULAR LAB PRELIMINARY  ABI completed:    RIGHT    LEFT    PRESSURE WAVEFORM  PRESSURE WAVEFORM  BRACHIAL 85 Tri BRACHIAL 88 Tri  DP 77 Tri DP absent   AT   AT    PT 113 Tri PT absent   PER   PER absent   GREAT TOE  NA GREAT TOE  NA    RIGHT LEFT  ABI 1.28 No apparent waveform     Farrel Demark, RDMS, RVT  01/02/2012, 11:10 AM

## 2012-01-02 NOTE — Progress Notes (Signed)
[facility]                                                                                Patient Demographics  Kevin [name], is a 71 y.o. male  YNW:[id number]  MVH:[id number]  DOB - 01/16/1941  Admit date - 12/31/2011  Admitting Physician Houston Siren, MD  Outpatient Primary MD for the patient is DEFAULT,PROVIDER, MD  LOS - 2   Chief Complaint  Patient presents with  . Foot Pain        Assessment & Plan   [name] 71 year old gentleman with history of hypertension, chronic systolic CHF with last EF 20% a few years ago per echo gram who does not follow with a cardiologist, dyslipidemia CAD, CK D., who presented to our hospital with history of left leg swelling and multiple ulcers on his left and the right leg, in the ER found to have left big toe gangrene along with evidence of poor circulation and mottling in other left toes, there was also evidence of surrounding cellulitis. He was admitted yesterday with empiric IV vancomycin and Zosyn, vascular surgery was consulted and he was tentatively scheduled for left leg amputation for Monday. Patient today is showing signs of systemic intermittent response was is sepsis. He has been profoundly hypotensive for the last few hours, he has developed acute renal insufficiency on top of CKD stage IV, he continues to have persisting leukocytosis.   At this point in the light of his poor EF patient will be transferred to ICU for central line placement and close CVP monitoring as he will be requiring IV fluids for his acute renal failure and hypotension, if he does not improve with supportive measures pressors might be needed.    1. PAD with L.Ischemic Ulcers on both legs worse on the left side, to necrosis in early stages in the left foot with SIRs & borderline sepsis on 01/02/2012 - Patient has been started upon admission on vancomycin and Zosyn, on 01/02/2012 has become profoundly hypotensive, I have ordered by each  bedtime and lactic acid level on him, IV fluids been given with caution, in light of his EF of 20% I think the patient would benefit from a central line placement and close CVP monitoring, I have discussed the case with the CC and physician Dr. Shan Levans would accept the patient in ICU for close monitoring. I've also call her doctor child's feeds will keep a close eye on the patient and potentially schedule him for an amputation tomorrow.   Patient has been explained clearly that he will be a high risk candidate for any operative procedure in terms of cardiopulmonary complications. He understands and accepts the fact.     2. Chronic systolic CHF which is compensated. Last EF was 20% a few years ago in the system. Tight fluid monitoringVia central line with CVP monitoring, At this time due to profound hypotension and acute renal insufficiency I have discontinued his home dose ACE inhibitor and Lasix, I have cut his Coreg dose in half of his home does withholding diabetes. Half cord E. To cardiology to see the patient, and repeat echo has been ordered in light of hypotension.  3. Acute Renal insufficiency on CK D. stage IV - likely due to hypotension, underlying infection. Last BMP has been requested from PCP office, no previous creatinine in our system, his home dose ACE inhibitor and Lasix have been discontinued in the light of acute renal insufficiency on 01/02/2012, IV fluids for now with close CVP monitoring in ICU, renal has been consulted to see the patient, UA with urine lytes have been ordered, repeat BMP in the morning. Pharmacy has been requested to closely monitor vancomycin levels.     4.Hyperkalemia - due to #4 above, Kayexalate by mouth given, telemetry monitoring, discontinue ACE, repeat BMP in the morning.     5.CAD, dyslipidemia, hypertension (now hypotensive) and tobacco abuse - currently chest pain-free, continue beta blocker at reduced dose and with holding parametrs,  Continueaspirin, statin. Counseled to quit smoking.    Code Status: Full  Family Communication: Discussed with the patient  Disposition Plan: TBD    Procedures ABIs   Consults  Vas Surg, Renal, [place] cards, PCCM   Time Spent in minutes   35   Antibiotics     Anti-infectives     Start     Dose/Rate Route Frequency Ordered Stop   01/02/12 1400   vancomycin (VANCOCIN) 750 mg in sodium chloride 0.9 % 150 mL IVPB        750 mg 150 mL/hr over 60 Minutes Intravenous Every 24 hours 01/02/12 0850     01/01/12 1200   vancomycin (VANCOCIN) IVPB 1000 mg/200 mL premix  Status:  Discontinued        1,000 mg 200 mL/hr over 60 Minutes Intravenous Every 24 hours 01/01/12 0158 01/02/12 0849   01/01/12 0230   vancomycin (VANCOCIN) 1,500 mg in sodium chloride 0.9 % 500 mL IVPB        1,500 mg 250 mL/hr over 120 Minutes Intravenous  Once 01/01/12 0158 01/01/12 0436   01/01/12 0215  piperacillin-tazobactam (ZOSYN) IVPB 3.375 g       3.375 g 12.5 mL/hr over 240 Minutes Intravenous Every 8 hours 01/01/12 0201     01/01/12 0200   piperacillin-tazobactam (ZOSYN) IVPB 3.375 g  Status:  Discontinued        3.375 g 100 mL/hr over 30 Minutes Intravenous Every 8 hours 01/01/12 0149 01/01/12 0201          Scheduled Meds:    . aspirin  325 mg Oral Daily  . atorvastatin  20 mg Oral Daily  . carvedilol  6.25 mg Oral BID WC  . docusate sodium  100 mg Oral Daily  . dutasteride  0.5 mg Oral QPM  . heparin  5,000 Units Subcutaneous Q8H  . influenza  inactive virus vaccine  0.5 mL Intramuscular Tomorrow-1000  . piperacillin-tazobactam (ZOSYN)  IV  3.375 g Intravenous Q8H  . sodium chloride  500 mL Intravenous Once  . sodium chloride  3 mL Intravenous Q12H  . sodium polystyrene  30 g Oral Once  . vancomycin  750 mg Intravenous Q24H  . DISCONTD: carvedilol  12.5 mg Oral BID WC  . DISCONTD: carvedilol  6.25 mg Oral ONCE-1800  . DISCONTD: diazepam  2 mg Oral Q8H  . DISCONTD: furosemide  40 mg  Oral Daily  . DISCONTD: furosemide  40 mg Oral Daily  . DISCONTD: lisinopril  20 mg Oral Daily  . DISCONTD: lisinopril  5 mg Oral Daily  . DISCONTD: vancomycin  1,000 mg Intravenous Q24H   Continuous Infusions:  PRN Meds:.sodium chloride, ondansetron (ZOFRAN) IV, oxyCODONE,  sodium chloride, DISCONTD: sodium chloride, DISCONTD: ondansetron, DISCONTD: oxyCODONE, DISCONTD: oxyCODONE-acetaminophen, DISCONTD: oxyCODONE-acetaminophen   DVT Prophylaxis   Heparin   Lab Results  Component Value Date   PLT 183 01/02/2012      Nyko Gell K M.D on 01/02/2012 at 9:00 AM  Between 7am to 7pm - Pager - [phone]  After 7pm go to [email or web address] - password [email or web address]  And look for the night coverage person covering for me after hours  [facility] Office  [phone]    Subjective:   Kevin [name] today has, No headache, No chest pain, No abdominal pain - No Nausea, No new weakness tingling or numbness, No Cough - SOB.   Objective:   Filed Vitals:   01/02/12 0610 01/02/12 0806 01/02/12 0815 01/02/12 0827  BP: 87/55 76/64 64/50  93/66  Pulse: 54 73  80  Temp: 97.5 F (36.4 C)     TempSrc:      Resp: 17     Height:      Weight:      SpO2: 91%       Wt Readings from Last 3 Encounters:  01/02/12 98.113 kg (216 lb 4.8 oz)     Intake/Output Summary (Last 24 hours) at 01/02/12 0900 Last data filed at 01/02/12 0855  Gross per 24 hour  Intake   1300 ml  Output   1100 ml  Net    200 ml    Exam Awake Alert, Oriented X 3, No new F.N deficits, Normal affect [place].AT,PERRAL Supple Neck,No JVD, No cervical lymphadenopathy appriciated.  Symmetrical Chest wall movement, Good air movement bilaterally, CTAB RRR,No Gallops,Rubs or new Murmurs, No Parasternal Heave +ve B.Sounds, Abd Soft, Non tender, No organomegaly appriciated, No rebound - guarding or rigidity. No Cyanosis, Clubbing or edema, left leg appears to be colder than the right, no hair on both lower legs, dry  gangrene in the left big toe and second and third toe, also some dry ischemic ulcers left leg more than right, some cellulitis in the left foot.   Data Review   Micro Results No results found for this or any previous visit (from the past 240 hour(s)).  Radiology Reports Dg Foot Complete Left  12/31/2011  *RADIOLOGY REPORT*  Clinical Data: foot pain  LEFT FOOT - COMPLETE 3+ VIEW  Comparison: None  Findings: The bones are diffusely osteopenic.  There is mild diffuse soft tissue swelling.  Plantar are and posterior calcaneal heel spurs noted.  Degenerative type changes noted involving the tibiotalar joint.  No fracture or subluxation noted.  IMPRESSION:  1.  No acute bony abnormalities.   Original Report Authenticated By: Rosealee Albee, M.D.     CBC  Lab 01/02/12 0715 01/01/12 0210 12/31/11 1846  WBC 19.3* 18.5* 17.8*  HGB 14.0 14.3 14.3  HCT 41.6 43.5 43.0  PLT 183 216 225  MCV 82.4 83.7 84.0  MCH 27.7 27.5 27.9  MCHC 33.7 32.9 33.3  RDW 16.1* 16.2* 16.2*  LYMPHSABS -- -- 2.2  MONOABS -- -- 1.1*  EOSABS -- -- 0.0  BASOSABS -- -- 0.0  BANDABS -- -- --    Chemistries   Lab 01/02/12 0715 01/01/12 0210 12/31/11 1846  NA 134* -- 136  K 5.4* -- 4.6  CL 99 -- 103  CO2 20 -- 24  GLUCOSE 151* -- 73  BUN 55* -- 36*  CREATININE 3.35* 1.51* 1.59*  CALCIUM 8.8 -- 9.0  MG -- -- --  AST -- -- 23  ALT -- -- 38  ALKPHOS -- -- 88  BILITOT -- -- 0.2*   ------------------------------------------------------------------------------------------------------------------ estimated creatinine clearance is 23 ml/min (by C-G formula based on Cr of 3.35). ------------------------------------------------------------------------------------------------------------------ No results found for this basename: HGBA1C:2 in the last 72 hours ------------------------------------------------------------------------------------------------------------------ No results found for this basename:  CHOL:2,HDL:2,LDLCALC:2,TRIG:2,CHOLHDL:2,LDLDIRECT:2 in the last 72 hours ------------------------------------------------------------------------------------------------------------------ No results found for this basename: TSH,T4TOTAL,FREET3,T3FREE,THYROIDAB in the last 72 hours ------------------------------------------------------------------------------------------------------------------ No results found for this basename: VITAMINB12:2,FOLATE:2,FERRITIN:2,TIBC:2,IRON:2,RETICCTPCT:2 in the last 72 hours  Coagulation profile  Lab 12/31/11 1846  INR 1.14  PROTIME --    No results found for this basename: DDIMER:2 in the last 72 hours  Cardiac Enzymes No results found for this basename: CK:3,CKMB:3,TROPONINI:3,MYOGLOBIN:3 in the last 168 hours ------------------------------------------------------------------------------------------------------------------ No components found with this basename: POCBNP:3

## 2012-01-02 NOTE — Progress Notes (Signed)
Made contact with Dr. Vassie Loll after change in Blood Pressure management to Dopamine.  Pt HR increased with increased ectopy.  Dr. Vassie Loll agreded not to start dobutamine at this time and continue with the Dopamine.

## 2012-01-02 NOTE — Progress Notes (Signed)
Foley catheter placed after discussion with Brett Canales Minor NP.  Patient reports having never been told he had prostate issues and that he has had a Foley catheter in the past and that there was not an issue on insertion.  Patient tolerated well and stated immediate relief as 325 ml UOP emptied on placement.

## 2012-01-02 NOTE — Progress Notes (Signed)
CRITICAL VALUE ALERT  Critical value received:  Troponin 1.02  Date of notification:  01/02/12  Time of notification:  13:05  Critical value read back:yes  Nurse who received alert:  Diona Fanti RN   MD notified (1st page):  Laurann Montana PA [facility]  Time of first page:  13:10  MD notified (2nd page):  Time of second page:  Responding MD:  Laurann Montana PA  Time MD responded:  13:15

## 2012-01-03 ENCOUNTER — Inpatient Hospital Stay (HOSPITAL_COMMUNITY): Payer: Medicare Other

## 2012-01-03 ENCOUNTER — Encounter (HOSPITAL_COMMUNITY): Payer: Self-pay | Admitting: Anesthesiology

## 2012-01-03 ENCOUNTER — Encounter (HOSPITAL_COMMUNITY)
Admission: EM | Disposition: A | Discharge status: Rehabilitation Facility | Payer: Self-pay | Source: Home / Self Care | Attending: Internal Medicine

## 2012-01-03 ENCOUNTER — Inpatient Hospital Stay (HOSPITAL_COMMUNITY): Payer: Medicare Other | Admitting: Anesthesiology

## 2012-01-03 DIAGNOSIS — I8010 Phlebitis and thrombophlebitis of unspecified femoral vein: Secondary | ICD-10-CM

## 2012-01-03 DIAGNOSIS — I2699 Other pulmonary embolism without acute cor pulmonale: Secondary | ICD-10-CM

## 2012-01-03 DIAGNOSIS — N2889 Other specified disorders of kidney and ureter: Secondary | ICD-10-CM | POA: Diagnosis present

## 2012-01-03 HISTORY — PX: VENA CAVA FILTER PLACEMENT: SHX1085

## 2012-01-03 LAB — BASIC METABOLIC PANEL
Calcium: 7.8 mg/dL — ABNORMAL LOW (ref 8.4–10.5)
Creatinine, Ser: 2.38 mg/dL — ABNORMAL HIGH (ref 0.50–1.35)
GFR calc Af Amer: 30 mL/min — ABNORMAL LOW (ref >90)
GFR calc non Af Amer: 26 mL/min — ABNORMAL LOW (ref >90)
Sodium: 135 mEq/L (ref 135–145)

## 2012-01-03 LAB — CBC
MCH: 27.3 pg (ref 26.0–34.0)
MCV: 83 fL (ref 78.0–100.0)
Platelets: 140 10*3/uL — ABNORMAL LOW (ref 150–400)
RBC: 4.58 MIL/uL (ref 4.22–5.81)
RDW: 16.1 % — ABNORMAL HIGH (ref 11.5–15.5)

## 2012-01-03 LAB — URINE CULTURE: Colony Count: NO GROWTH

## 2012-01-03 LAB — HEPARIN LEVEL (UNFRACTIONATED): Heparin Unfractionated: 0.66 IU/mL (ref 0.30–0.70)

## 2012-01-03 LAB — VANCOMYCIN, RANDOM: Vancomycin Rm: 15.9 ug/mL

## 2012-01-03 LAB — CORTISOL: Cortisol, Plasma: 2.1 ug/dL

## 2012-01-03 LAB — PROTIME-INR: INR: 1.37 (ref 0.00–1.49)

## 2012-01-03 SURGERY — INSERTION, FILTER, VENA CAVA
Anesthesia: General | Site: Groin | Laterality: Left | Wound class: Clean

## 2012-01-03 MED ORDER — TECHNETIUM TC 99M DIETHYLENETRIAME-PENTAACETIC ACID: 40.0000 | Freq: Once | INTRAVENOUS | Status: AC | PRN

## 2012-01-03 MED ORDER — PIPERACILLIN-TAZOBACTAM 3.375 G IVPB
3.3750 g | Freq: Three times a day (TID) | INTRAVENOUS | Status: DC
  Administered 2012-01-03 – 2012-01-10 (×18): 3.375 g via INTRAVENOUS
  Filled 2012-01-03 (×23): qty 50

## 2012-01-03 MED ORDER — SODIUM CHLORIDE 0.9 % IR SOLN
Status: DC | PRN
  Administered 2012-01-03: 19:00:00

## 2012-01-03 MED ORDER — SODIUM CHLORIDE 0.45 % IV SOLN
INTRAVENOUS | Status: DC
  Administered 2012-01-03: 1000 mL via INTRAVENOUS
  Administered 2012-01-06: 07:00:00 via INTRAVENOUS

## 2012-01-03 MED ORDER — VANCOMYCIN HCL IN DEXTROSE 1-5 GM/200ML-% IV SOLN
1000.0000 mg | INTRAVENOUS | Status: AC
  Administered 2012-01-03: 1000 mg via INTRAVENOUS
  Filled 2012-01-03: qty 200

## 2012-01-03 MED ORDER — TECHNETIUM TO 99M ALBUMIN AGGREGATED
3.0000 | Freq: Once | INTRAVENOUS | Status: AC | PRN
  Administered 2012-01-03: 3 via INTRAVENOUS

## 2012-01-03 MED ORDER — DOPAMINE-DEXTROSE 1.6-5 MG/ML-% IV SOLN
INTRAVENOUS | Status: DC | PRN
  Administered 2012-01-03: 6 ug/kg/min via INTRAVENOUS

## 2012-01-03 MED ORDER — FENTANYL CITRATE 0.05 MG/ML IJ SOLN
INTRAMUSCULAR | Status: DC | PRN
  Administered 2012-01-03 (×2): 50 ug via INTRAVENOUS

## 2012-01-03 MED ORDER — VANCOMYCIN HCL IN DEXTROSE 1-5 GM/200ML-% IV SOLN
1000.0000 mg | INTRAVENOUS | Status: DC
  Administered 2012-01-04 – 2012-01-09 (×6): 1000 mg via INTRAVENOUS
  Filled 2012-01-03 (×7): qty 200

## 2012-01-03 MED ORDER — PHENYLEPHRINE HCL 10 MG/ML IJ SOLN
20.0000 mg | INTRAVENOUS | Status: DC | PRN
  Administered 2012-01-03: 50 ug/min via INTRAVENOUS

## 2012-01-03 MED ORDER — SODIUM CHLORIDE 0.9 % IV SOLN
INTRAVENOUS | Status: DC | PRN
  Administered 2012-01-03: 18:00:00 via INTRAVENOUS

## 2012-01-03 MED ORDER — HEPARIN (PORCINE) IN NACL 100-0.45 UNIT/ML-% IJ SOLN
INTRAMUSCULAR | Status: DC | PRN
  Administered 2012-01-03: 1200 [IU]/h via INTRAVENOUS

## 2012-01-03 MED ORDER — LIDOCAINE HCL (PF) 1 % IJ SOLN
INTRAMUSCULAR | Status: DC | PRN
  Administered 2012-01-03: 3 mL

## 2012-01-03 MED ORDER — PANTOPRAZOLE SODIUM 40 MG IV SOLR
40.0000 mg | INTRAVENOUS | Status: DC
Start: 2012-01-03 — End: 2012-01-03
  Filled 2012-01-03: qty 40

## 2012-01-03 MED ORDER — MIDAZOLAM HCL 5 MG/5ML IJ SOLN
INTRAMUSCULAR | Status: DC | PRN
  Administered 2012-01-03 (×2): 1 mg via INTRAVENOUS

## 2012-01-03 MED ORDER — PANTOPRAZOLE SODIUM 40 MG PO TBEC
40.0000 mg | DELAYED_RELEASE_TABLET | Freq: Every day | ORAL | Status: DC
  Administered 2012-01-03 – 2012-01-10 (×7): 40 mg via ORAL
  Filled 2012-01-03 (×6): qty 1

## 2012-01-03 MED ORDER — FENTANYL CITRATE 0.05 MG/ML IJ SOLN: 25.0000 ug | INTRAMUSCULAR | Status: DC | PRN

## 2012-01-03 SURGICAL SUPPLY — 33 items
BAG DECANTER FOR FLEXI CONT (MISCELLANEOUS) ×2 IMPLANT
BLADE SURG 11 STRL SS (BLADE) ×2 IMPLANT
CLOTH BEACON ORANGE TIMEOUT ST (SAFETY) ×2 IMPLANT
COVER SURGICAL LIGHT HANDLE (MISCELLANEOUS) ×2 IMPLANT
COVER TABLE BACK 60X90 (DRAPES) ×2 IMPLANT
DECANTER SPIKE VIAL GLASS SM (MISCELLANEOUS) ×2 IMPLANT
DRAPE C-ARM 42X72 X-RAY (DRAPES) ×2 IMPLANT
DRAPE LAPAROTOMY T 102X78X121 (DRAPES) ×2 IMPLANT
FILTER TRAPEASE 55CM (Filter) ×1 IMPLANT
GAUZE SPONGE 4X4 16PLY XRAY LF (GAUZE/BANDAGES/DRESSINGS) IMPLANT
GLOVE BIO SURGEON STRL SZ7.5 (GLOVE) ×2 IMPLANT
GOWN PREVENTION PLUS XLARGE (GOWN DISPOSABLE) ×2 IMPLANT
GOWN STRL NON-REIN LRG LVL3 (GOWN DISPOSABLE) ×4 IMPLANT
KIT BASIN OR (CUSTOM PROCEDURE TRAY) ×2 IMPLANT
KIT ROOM TURNOVER OR (KITS) ×2 IMPLANT
NDL HYPO 25GX1X1/2 BEV (NEEDLE) ×1 IMPLANT
NDL PERC 18GX7CM (NEEDLE) ×1 IMPLANT
NEEDLE HYPO 25GX1X1/2 BEV (NEEDLE) ×2 IMPLANT
NEEDLE PERC 18GX7CM (NEEDLE) ×2 IMPLANT
NS IRRIG 1000ML POUR BTL (IV SOLUTION) ×2 IMPLANT
PACK SURGICAL SETUP 50X90 (CUSTOM PROCEDURE TRAY) ×2 IMPLANT
PAD ARMBOARD 7.5X6 YLW CONV (MISCELLANEOUS) ×4 IMPLANT
SPONGE GAUZE 4X4 12PLY (GAUZE/BANDAGES/DRESSINGS) ×2 IMPLANT
SPONGE LAP 18X18 X RAY DECT (DISPOSABLE) IMPLANT
SUT ETHILON 3 0 PS 1 (SUTURE) ×2 IMPLANT
SYR 20ML ECCENTRIC (SYRINGE) ×2 IMPLANT
SYR 30ML LL (SYRINGE) ×2 IMPLANT
SYR 5ML LL (SYRINGE) ×2 IMPLANT
SYR CONTROL 10ML LL (SYRINGE) ×2 IMPLANT
TOWEL OR 17X24 6PK STRL BLUE (TOWEL DISPOSABLE) ×2 IMPLANT
TOWEL OR 17X26 10 PK STRL BLUE (TOWEL DISPOSABLE) ×2 IMPLANT
WATER STERILE IRR 1000ML POUR (IV SOLUTION) ×2 IMPLANT
WIRE J 3MM .035X145CM (WIRE) ×2 IMPLANT

## 2012-01-03 NOTE — Progress Notes (Signed)
ANTICOAGULATION CONSULT NOTE - Follow Up Consult  Pharmacy Consult for Heparin Indication: pulmonary embolus  No Known Allergies  Patient Measurements: Height: 5\' 8"  (172.7 cm) Weight: 216 lb 4.8 oz (98.113 kg) IBW/kg (Calculated) : 68.4  Heparin Dosing Weight: 80 kg  Vital Signs: Temp: 97.5 F (36.4 C) (10/21 2000) Temp src: Oral (10/21 2000) BP: 102/89 mmHg (10/21 2200) Pulse Rate: 70  (10/21 2200)  Labs:  Basename 01/03/12 2108 01/03/12 0756 01/03/12 0430 01/03/12 01/02/12 2312 01/02/12 1830 01/02/12 1540 01/02/12 1130 01/02/12 0715 01/01/12 0210  HGB -- -- 12.5* -- -- -- -- -- 14.0 --  HCT -- -- 38.0* -- -- -- -- -- 41.6 43.5  PLT -- -- 140* -- -- -- -- -- 183 216  APTT -- -- -- -- -- -- -- -- -- --  LABPROT -- 16.5* -- -- -- -- -- -- -- --  INR -- 1.37 -- -- -- -- -- -- -- --  HEPARINUNFRC 0.83* 1.12* -- 0.66 -- -- -- -- -- --  CREATININE -- -- 2.38* -- -- -- 2.91* -- 3.35* --  CKTOTAL -- -- -- -- -- -- 184 -- -- --  CKMB -- -- -- -- -- -- -- -- -- --  TROPONINI -- -- -- -- 1.33* 1.14* -- 1.02* -- --   Estimated Creatinine Clearance: 32.3 ml/min (by C-G formula based on Cr of 2.38).  Assessment: 71 yo male admitted with ischemic left leg.  It has also been confirmed that he has a PE and today had an IVC filter placed.  He has been started on IV heparin and repeat level is 0.84 which is still slightly above our goal range of 0.3-0.7.  I spoke with the nurse and she has had no issues with the IV nor has she noticed any bleeding complications.    Goal of Therapy:  Heparin level 0.3-0.7 units/ml Monitor platelets by anticoagulation protocol: Yes   Plan:   - Will reduce his Heparin to 1050 units/hr  - F/U AM labs and adjust as needed  - Monitor for bleeding complications  Nadara Mustard, PharmD., MS Clinical Pharmacist Pager:  [phone] Thank you for allowing pharmacy to be part of this patients care team. 01/03/2012,10:43 PM

## 2012-01-03 NOTE — H&P (View-Only) (Signed)
Events of the last 24 hr noted, hypotension renal failure.  Filed Vitals:   01/02/12 0900 01/02/12 0923 01/02/12 1000 01/02/12 1015  BP: 77/52 84/72 69/50  90/68  Pulse: 61  89 39  Temp:      TempSrc:      Resp:   19 18  Height:      Weight:      SpO2:   100% 98%   Left lower extremity unchanged appearance, cool, dusky, no doppler signals ABI on right is normal  Assessment: Due to decline in status pt not a candidate for agram or any revascularization.  He needs a left AKA.  Will schedule for tomorrow pending his clinical status.  His leg could be the source of hypotension but does not really have the look of significant infection but could be related to muscle necrosis and rhabdo.  Plan: NPO p midnight.  Left AKA tomorrow  Fabienne Bruns, MD [facility] Office: [phone] Pager: [phone]

## 2012-01-03 NOTE — Anesthesia Preprocedure Evaluation (Addendum)
Anesthesia Evaluation  Patient identified by MRN, date of birth, ID band Patient awake    Reviewed: Allergy & Precautions, H&P , NPO status , Patient's Chart, lab work & pertinent test results, reviewed documented beta blocker date and time   Airway Mallampati: III TM Distance: >3 FB   Mouth opening: Limited Mouth Opening  Dental  (+) Loose, Poor Dentition and Dental Advisory Given   Pulmonary Current Smoker,  Smoker 1 ppd X 50 yrs IMPRESSION: Matched defect within the superior segment of right lower lobe and heterogeneous perfusion to the left lung.   Intermediate probability acute pulmonary embolism.   Findings discussed  Dr. Delford Field on 01/03/2012 at 1415 hours      + decreased breath sounds      Cardiovascular hypertension, Pt. on medications and Pt. on home beta blockers + CAD and +CHF + dysrhythmias Rhythm:Irregular Rate:Tachycardia  Left leg ischemia     Non-healing ulcers from  trauma   Neuro/Psych    GI/Hepatic Neg liver ROS, GERD-  Controlled,  Endo/Other    Renal/GU Renal diseaseCreatinine 2.38     Musculoskeletal  (+) Arthritis -, Osteoarthritis,    Abdominal   Peds  Hematology negative hematology ROS (+)   Anesthesia Other Findings Many missing//// Right  Upper central incisor missing     Left  pres.but loose  Reproductive/Obstetrics                        Anesthesia Physical Anesthesia Plan  ASA: IV  Anesthesia Plan: MAC   Post-op Pain Management:    Induction: Intravenous  Airway Management Planned: Nasal Cannula  Additional Equipment:   Intra-op Plan:   Post-operative Plan:   Informed Consent: I have reviewed the patients History and Physical, chart, labs and discussed the procedure including the risks, benefits and alternatives for the proposed anesthesia with the patient or authorized representative who has indicated his/her understanding and acceptance.   Dental  advisory given  Plan Discussed with: CRNA and Anesthesiologist  Anesthesia Plan Comments:         Anesthesia Quick Evaluation

## 2012-01-03 NOTE — Preoperative (Signed)
Beta Blockers   Reason not to administer Beta Blockers:Hold beta blocker due to hypotension 

## 2012-01-03 NOTE — Progress Notes (Signed)
CRITICAL VALUE ALERT  Critical value received:  1.9 x 1.3 x 1.3 cm lesion in the upper pole of the right kidney  is indeterminate by ultrasound examination. While this may simply  represent a small cyst with proteinaceous or hemorrhagic contents,  further characterization with contrast enhanced MRI may be  warranted to exclude an underlying neoplasm.    Date of notification:  01/03/12  Time of notification:  1207  Critical value read back: yes  Nurse who received alert: K.Merilyn Pagan RN  MD notified (1st page):  Dr. Delford Field  Time of first page:  1208  MD notified (2nd page):  Time of second page:  Responding MD:  Dr. Delford Field  Time MD responded:  1208

## 2012-01-03 NOTE — Anesthesia Postprocedure Evaluation (Signed)
  Anesthesia Post-op Note  Patient: Kevin [name]  Procedure(s) Performed: Procedure(s) (LRB) with comments: INSERTION VENA-CAVA FILTER (Left)  Patient Location: PACU  Anesthesia Type: MAC  Level of Consciousness: awake, alert  and oriented  Airway and Oxygen Therapy: Patient Spontanous Breathing and Patient connected to nasal cannula oxygen  Post-op Pain: none  Post-op Assessment: Post-op Vital signs reviewed, Patient's Cardiovascular Status Stable, Respiratory Function Stable, Patent Airway and No signs of Nausea or vomiting  Post-op Vital Signs: Reviewed and stable  Complications: No apparent anesthesia complications

## 2012-01-03 NOTE — Progress Notes (Signed)
ANTICOAGULATION CONSULT NOTE   Pharmacy Consult for Heparin Indication: r/o PE  No Known Allergies  Patient Measurements: Height: 5\' 8"  (172.7 cm) Weight: 216 lb 4.8 oz (98.113 kg) IBW/kg (Calculated) : 68.4  Heparin Dosing Weight:  90 kg  Vital Signs: Temp: 97.3 F (36.3 C) (10/20 2051) Temp src: Oral (10/20 2051) BP: 91/58 mmHg (10/21 0100) Pulse Rate: 56  (10/21 0100)  Labs:  Basename 01/03/12 01/02/12 2312 01/02/12 1830 01/02/12 1540 01/02/12 1130 01/02/12 0715 01/01/12 0210 12/31/11 1846  HGB -- -- -- -- -- 14.0 14.3 --  HCT -- -- -- -- -- 41.6 43.5 43.0  PLT -- -- -- -- -- 183 216 225  APTT -- -- -- -- -- -- -- 27  LABPROT -- -- -- -- -- -- -- 14.4  INR -- -- -- -- -- -- -- 1.14  HEPARINUNFRC 0.66 -- -- -- -- -- -- --  CREATININE -- -- -- 2.91* -- 3.35* 1.51* --  CKTOTAL -- -- -- 184 -- -- -- --  CKMB -- -- -- -- -- -- -- --  TROPONINI -- 1.33* 1.14* -- 1.02* -- -- --    Estimated Creatinine Clearance: 26.4 ml/min (by C-G formula based on Cr of 2.91).  Assessment: 71 yo male with possible PE, awaiting VQ scan, for Heparin  Goal of Therapy:  Heparin level 0.3-0.7 units/ml Monitor platelets by anticoagulation protocol: Yes   Plan:  Continue Heparin at current rate for now Recheck level in am to verify  Pauline Pegues, Gary Fleet 01/03/2012,1:35 AM

## 2012-01-03 NOTE — Progress Notes (Signed)
Right:  DVT noted in the common femoral, femoral, profunda, popliteal, posterior tibial, and peroneal veins.  No evidence of superficial thrombosis.  No Baker's cyst.  Left:  No evidence of DVT, superficial thrombosis, or Baker's cyst.

## 2012-01-03 NOTE — OR Nursing (Signed)
Gauze drsg to l groin/ soft, no hematoma noted

## 2012-01-03 NOTE — Op Note (Signed)
OPERATIVE REPORT  DATE OF SURGERY: 01/03/2012  PATIENT: Kevin [name], 71 y.o. male MRN: [id number]  DOB: 11/01/1940  PRE-OPERATIVE DIAGNOSIS: Pulmonary embolus, right leg DVT  POST-OPERATIVE DIAGNOSIS:  Same  PROCEDURE: Trapeze inferior vena cava filter via left femoral approach  SURGEON:  Gretta Began, M.D.   ANESTHESIA:  Local with sedation  EBL: Minimal ml     BLOOD ADMINISTERED: None  DRAINS: None  SPECIMEN: None  COUNTS CORRECT:  YES  PLAN OF CARE: PACU with KUB pending   PATIENT DISPOSITION:  PACU - hemodynamically stable  PROCEDURE DETAILS: Patient was taken to the operating room placed supine position where the area of the groin on the left was prepped and draped in usual sterile fashion. Using ultrasound guidance the left common femoral vein was accessed and a J-wire was passed up to the level of the suprarenal vena cava. The long dilator and sheath were passed over the guidewire up to the level of L2. The patient's creatinine was 2.5 and therefore no contrast was used. The dilator was removed and the trapeze filter was positioned in the delivery sheath. This was then positioned to where it was just inside the delivery sheath. The top of the filter was placed at the level of L2. The filter was deployed. The sheath was removed and pressure was held for hemostasis. The patient was transferred to the recovery room where KUB x-ray is pending   Gretta Began, M.D. 01/03/2012 7:07 PM

## 2012-01-03 NOTE — Progress Notes (Addendum)
Name: Kevin [name] MRN: [id number] DOB: 07-13-1940    LOS: 3 Requesting MD: [facility] Regarding:HJypotension in setting of lower extremity ischemia and cellulitis.     History of Present Illness: 71 yo male with a hx of HTN, CRI(unknown base creatine but 1.59 on 10-18), chronic chf with reduced EF of 20%. He presented to [facility] ED 10-18 with left great toe gangrene , ischemic of lower extremities and evaluated by VVS. 10-19 while on floor he had hypotension and was transferred to ICU with presumed sepsis, increased  renal dysfunction and PCCM was asked to assume his care 10-19.  Lines / Drains: 10-20 Left Rt J>>  Cultures: 10-20 bc x 2>> 10-20 UC>>  Antibiotics: 10-18 vanc>> 10-18 zoysn>>  Tests / Events: 10/21 V/Q>>> 10/21 Renal U/S>> 1.9 x 1.3 x 1.3 cm lesion in the upper pole of the right kidney  is indeterminate by ultrasound examination. While this may simply  represent a small cyst with proteinaceous or hemorrhagic contents,  further characterization with contrast enhanced MRI may be  warranted to exclude an underlying neoplasm.   Subjective: Awake and follows commands  Vital Signs: Temp:  [97.1 F (36.2 C)-97.7 F (36.5 C)] 97.7 F (36.5 C) (10/21 0400) Pulse Rate:  [41-120] 43  (10/21 1100) Resp:  [10-23] 10  (10/21 1100) BP: (62-153)/(40-133) 121/68 mmHg (10/21 1100) SpO2:  [79 %-100 %] 99 % (10/21 1100) I/O last 3 completed shifts: In: 5461 [P.O.:400; I.V.:2309; Other:2000; IV Piggyback:752] Out: 1671 [Urine:1670; Stool:1]  Physical Examination: General:  Elderly male male Neuro:  Intact, mae x 4, affect dull HEENT:  No LAN, Oral mucosa unremakable. No JVD Cardiovascular:  hsd rrr Lungs: decreased bs bases Abdomen:  +BS Musculoskeletal:  Intact Skin:  LOWER EXT COOL. UCLERS ON BOTH FEET. , mult ulcers  in LLE/foot  [place] oxygen    Labs and Imaging:   Lab 01/03/12 0430 01/02/12 1540 01/02/12 0715  NA 135 136 134*  K 4.4 4.5 5.4*  CL 105 102 99  CO2 21 21 20   BUN 49* 54* 55*  CREATININE 2.38* 2.91* 3.35*  GLUCOSE 143* 181* 151*    Lab 01/03/12 0430 01/02/12 0715 01/01/12 0210  HGB 12.5* 14.0 14.3  HCT 38.0* 41.6 43.5  WBC 14.9* 19.3* 18.5*  PLT 140* 183 216   ABG No results found for this basename: phart,  pco2,  pco2art,  po2,  po2art,  hco3,  tco2,  acidbasedef,  o2sat     Assessment and Plan:  Sepsis and septic shock A: Profound hypotension in setting of presumed sepsis from gangrenous left toe and lower extremity ischemia. >>>BP better with DA and volume.   RV overload on echo ??PE/DVT  Lab 01/02/12 1130  PROCALCITON 0.16     P: -titrate pressors -cont ABX -chk for PE with V/Q scan -cont heparin drip   Renal: Lab Results  Component Value Date   CREATININE 2.38* 01/03/2012   CREATININE 2.91* 01/02/2012   CREATININE 3.35* 01/02/2012    Lab 01/03/12 0430 01/02/12 1540 01/02/12 0715  K 4.4 4.5 5.4*     A: CRI with base creatine 1.5 range Hyperkalemia>>>resolved Renal lesion on Right  kidney ?CA P: -renal following and appreciate. -eventually needs renal MRI    ID: A: Gangrene of Left great toe and lower ext cellulitis.   P: -See Id sections -will need amputation of L leg AKA this PM    Cardiac:  A:Hx of CAD and CHF, RV overload. I apprec cards help here. P: -titrate DA.  See resp section R/o PE     Best practices / Disposition: -->ICU status under PCCM -->full code -->Heparin full drip  For poss PE -->Protonix for GI Px -->diet NPO  -->family  Ex spouse updated   CC35     Caryl Bis  [phone]  Cell  [phone]  If no response or cell goes to voicemail, call beeper [phone] 01/03/2012, 12:18 PM

## 2012-01-03 NOTE — Progress Notes (Signed)
[name]  Subjective:  Awake, alert, knows he will need AKA   Objective: Vital signs in last 24 hours: Blood pressure 115/62, pulse 67, temperature 97.7 F (36.5 C), temperature source Oral, resp. rate 18, height 5\' 8"  (1.727 m), weight 98.113 kg (216 lb 4.8 oz), SpO2 99.00%.    PHYSICAL EXAM General--As above Chest--clear Heart--no rub Abd--nontender Extr--Left leg with superficial skin loss, bluish discoloration mid tib area, some mottling/discoloration to the knee. Left first toe is dark. Leg is cool the the knee. Right leg with old healed ulcerations, trace pretibial  edema   Lab Results:   Lab 01/03/12 0430 01/02/12 1540 01/02/12 0715  NA 135 136 134*  K 4.4 4.5 5.4*  CL 105 102 99  CO2 21 21 20   BUN 49* 54* 55*  CREATININE 2.38* 2.91* 3.35*  ALB -- -- --  GLUCOSE 143* -- --  CALCIUM 7.8* 7.8* 8.8  PHOS -- -- --     Basename 01/03/12 0430 01/02/12 0715  WBC 14.9* 19.3*  HGB 12.5* 14.0  HCT 38.0* 41.6  PLT 140* 183    Assessment/Plan: 1. AKI in the setting of ischemic leg, hypotension with ACE on board and use of NSAIDS (clinoril and ibuprofen) up to the time of admission. Cr is now decreasing.   Dopamine 10 mcg/kg/min.  NS 75/hr currently.  CK not markedly elevated (184).  Good urine output with foley cath.  I have written to change IVF to 1/2 NS with 75 bicarb @40 /hr.  Will also check renal US, SPEP, and UPEP  2. Ischemic left leg - for possible AKA 10/21 by Dr. Darrick Penna.  I'd rec. Proceed with surgery when Cr near baseline (1.59) unless problems arise requiring earlier surgery  3. Hypotension (with h/o HTN) meds d/c'd--BP better now  4. NICM--LVEF 50-55%  5. RV dysfunction - newly recognized - per Dr. Johney Frame    LOS: 3 days   Zymarion Favorite F 01/03/2012,8:18 AM   .labalb

## 2012-01-03 NOTE — Interval H&P Note (Signed)
History and Physical Interval Note:  01/03/2012 5:48 PM  Kevin [name]  has presented today for surgery, with the diagnosis of /  The various methods of treatment have been discussed with the patient and family. After consideration of risks, benefits and other options for treatment, the patient has consented to  Procedure(s) (LRB) with comments: INSERTION VENA-CAVA FILTER (Left) as a surgical intervention .  The patient's history has been reviewed, patient examined, no change in status, stable for surgery.  I have reviewed the patient's chart and labs.  Questions were answered to the patient's satisfaction.    The patient was scheduled for left above-knee amputation today by Dr. Darrick Penna. He has had no new diagnosis of bilateral pulmonary embolus and a duplex of his lower surety reveal a common femoral vein clot on the right. There is no clot on the left. Stop it is too high risk for a dictation today and is taken today for a vena caval filter. I discussed this with the patient understands [name]

## 2012-01-03 NOTE — Transfer of Care (Signed)
Immediate Anesthesia Transfer of Care Note  Patient: Kevin [name]  Procedure(s) Performed: Procedure(s) (LRB) with comments: INSERTION VENA-CAVA FILTER (Left)  Patient Location: PACU  Anesthesia Type: MAC  Level of Consciousness: awake, alert  and oriented  Airway & Oxygen Therapy: Patient Spontanous Breathing and Patient connected to nasal cannula oxygen  Post-op Assessment: Report given to PACU RN and Post -op Vital signs reviewed and stable  Post vital signs: Reviewed  Complications: No apparent anesthesia complications

## 2012-01-03 NOTE — Progress Notes (Signed)
ANTIBIOTIC CONSULT NOTE - Follow-up  Pharmacy Consult for vancomycin/zosyn/heparin Indication: Ischemic leg possible cellulitis/sepsis/R/o PE   No Known Allergies  Patient Measurements: Height: 5\' 8"  (172.7 cm) Weight: 216 lb 4.8 oz (98.113 kg) IBW/kg (Calculated) : 68.4   Vital Signs: Temp: 98 F (36.7 C) (10/21 0800) Temp src: Oral (10/21 0400) BP: 119/97 mmHg (10/21 1200) Pulse Rate: 100  (10/21 1200) Intake/Output from previous day: 10/20 0701 - 10/21 0700 In: 5411 [P.O.:400; I.V.:2309; IV Piggyback:702] Out: 1671 [Urine:1670; Stool:1] Intake/Output from this shift: Total I/O In: 438.6 [I.V.:438.6] Out: 275 [Urine:275]  Labs:  [facility] 01/03/12 0430 01/02/12 1540 01/02/12 1200 01/02/12 0715 01/01/12 0210  WBC 14.9* -- -- 19.3* 18.5*  HGB 12.5* -- -- 14.0 14.3  PLT 140* -- -- 183 216  LABCREA -- -- 85.10 -- --  CREATININE 2.38* 2.91* -- 3.35* --   Estimated Creatinine Clearance: 32.3 ml/min (by C-G formula based on Cr of 2.38).  Basename 01/03/12 0756  VANCOTROUGH --  Leodis Binet --  VANCORANDOM 15.9  GENTTROUGH --  GENTPEAK --  GENTRANDOM --  TOBRATROUGH --  Nolen Mu --  TOBRARND --  AMIKACINPEAK --  AMIKACINTROU --  AMIKACIN --     Microbiology: Recent Results (from the past 720 hour(s))  CULTURE, BLOOD (ROUTINE X 2)     Status: Normal (Preliminary result)   Collection Time   01/02/12 10:20 AM      Component Value Range Status Comment   Specimen Description BLOOD RIGHT HAND   Final    Special Requests BOTTLES DRAWN AEROBIC ONLY 3CC   Final    Culture  Setup Time 01/02/2012 15:52   Final    Culture     Final    Value:        BLOOD CULTURE RECEIVED NO GROWTH TO DATE CULTURE WILL BE HELD FOR 5 DAYS BEFORE ISSUING A FINAL NEGATIVE REPORT   Report Status PENDING   Incomplete   MRSA PCR SCREENING     Status: Normal   Collection Time   01/02/12 10:22 AM      Component Value Range Status Comment   MRSA by PCR NEGATIVE  NEGATIVE Final   CULTURE, BLOOD  (ROUTINE X 2)     Status: Normal (Preliminary result)   Collection Time   01/02/12 10:30 AM      Component Value Range Status Comment   Specimen Description BLOOD RIGHT HAND   Final    Special Requests     Final    Value: BOTTLES DRAWN AEROBIC AND ANAEROBIC 5CC AER 3CC ANA   Culture  Setup Time 01/02/2012 15:52   Final    Culture     Final    Value:        BLOOD CULTURE RECEIVED NO GROWTH TO DATE CULTURE WILL BE HELD FOR 5 DAYS BEFORE ISSUING A FINAL NEGATIVE REPORT   Report Status PENDING   Incomplete   URINE CULTURE     Status: Normal   Collection Time   01/02/12 12:00 PM      Component Value Range Status Comment   Specimen Description URINE, CATHETERIZED   Final    Special Requests NONE   Final    Culture  Setup Time 01/02/2012 17:12   Final    Colony Count NO GROWTH   Final    Culture NO GROWTH   Final    Report Status 01/03/2012 FINAL   Final   CULTURE, EXPECTORATED SPUTUM-ASSESSMENT     Status: Normal   Collection Time  01/02/12  2:15 PM      Component Value Range Status Comment   Specimen Description SPUTUM   Final    Special Requests NONE   Final    Sputum evaluation     Final    Value: THIS SPECIMEN IS ACCEPTABLE. RESPIRATORY CULTURE REPORT TO FOLLOW.   Report Status 01/02/2012 FINAL   Final   CULTURE, RESPIRATORY     Status: Normal (Preliminary result)   Collection Time   01/02/12  2:15 PM      Component Value Range Status Comment   Specimen Description SPUTUM   Final    Special Requests NONE   Final    Gram Stain     Final    Value: FEW WBC PRESENT, PREDOMINANTLY PMN     FEW SQUAMOUS EPITHELIAL CELLS PRESENT     NO ORGANISMS SEEN   Culture NO GROWTH   Final    Report Status PENDING   Incomplete     Assessment: 71 yo male admitted with ischemic left leg. Pharmacy consulted to manage vancomycin and zosyn. Pt received a total of 2.5g of vanc load 10/19. His scr has rapidly risen 3.35 improved > 2.4 with CrCl 70ml/min.  Random vancomycin level 15.9 so will redose  vancomycin 1gm Q24.  Will also adjust zosyn dose for improved renal fx.   He was also started on heparin drip for possible PE  - VQ just back + PE. Heparin drip 1350 uts/hr HL 1.12 > goal, h/h, pltc stable.  May go for filter today so will await more definate plan prior to rechecking HL Goal of Therapy:  Vancomycin trough 15-20 mcg/mL Heparin level = 0.3-0.7  Plan:  Vancomycin 1Gm q24 Zosyn 3.375Gm q8 EI Decrease heparin 1200 uts/hr   Marcelino Scot 01/03/2012,2:04 PM

## 2012-01-03 NOTE — Progress Notes (Signed)
    Subjective:  No chest pain or dyspnea.  Objective:  Vital Signs in the last 24 hours: Temp:  [97.1 F (36.2 C)-98.2 F (36.8 C)] 97.7 F (36.5 C) (10/21 0400) Pulse Rate:  [37-120] 99  (10/21 1000) Resp:  [13-23] 17  (10/21 1000) BP: (62-153)/(40-133) 107/88 mmHg (10/21 1000) SpO2:  [79 %-100 %] 98 % (10/21 1000)  Intake/Output from previous day: 10/20 0701 - 10/21 0700 In: 5411 [P.O.:400; I.V.:2309; IV Piggyback:702] Out: 1671 [Urine:1670; Stool:1]  Physical Exam: Pt is alert and oriented, NAD HEENT: normal Neck: JVP - normal Lungs: CTA bilaterally CV: RRR without murmur or gallop Abd: soft, NT, Positive BS, no hepatomegaly Ext: no C/C/E, legs are cool with left leg gangrene noted  Lab Results:  Basename 01/03/12 0430 01/02/12 0715  WBC 14.9* 19.3*  HGB 12.5* 14.0  PLT 140* 183    Basename 01/03/12 0430 01/02/12 1540  NA 135 136  K 4.4 4.5  CL 105 102  CO2 21 21  GLUCOSE 143* 181*  BUN 49* 54*  CREATININE 2.38* 2.91*    Basename 01/02/12 2312 01/02/12 1830  TROPONINI 1.33* 1.14*    Cardiac Studies: 2D Echo: Study Conclusions  - Left ventricle: The cavity size was moderately reduced. Systolic function was grossly normal. The estimated ejection fraction was in the range of 50% to 55%. - Ventricular septum: Septal motion showed paradox. - Right ventricle: The cavity size was severely dilated. Systolic function was severely reduced. - Right atrium: The atrium was moderately to severely dilated. - Pulmonary arteries: PA pressure is at least mildly elevated. PA peak pressure: 34mm Hg (S). Impressions:  - No prior echocardiogram available for comparison. Dilated RV with reduced RV systolic function raises the question of pulmonary embolism.  Tele: Sinus rhythm with frequent ventricular and supraventricular ectopy  Assessment/Plan:  1. Dilated right heart with RV failure 2. Hypotension 3. Non-STEMI 4. Ischemic leg with Gangrene  I have  reviewed all the patient's available clinical data, including his echo report and images. His right ventricle is markedly dilated and nearly akinetic. I agree that he needs a VQ scan to rule out pulmonary embolus. If he rules out for PE, his management will likely be limited to maintain adequate preload and treating him for heart failure. Will follow along closely. He will require amputation probably this afternoon.    Tonny Bollman, M.D. 01/03/2012, 11:13 AM

## 2012-01-04 ENCOUNTER — Encounter (HOSPITAL_COMMUNITY): Payer: Self-pay | Admitting: Vascular Surgery

## 2012-01-04 DIAGNOSIS — I82A19 Acute embolism and thrombosis of unspecified axillary vein: Secondary | ICD-10-CM | POA: Diagnosis not present

## 2012-01-04 DIAGNOSIS — I2699 Other pulmonary embolism without acute cor pulmonale: Secondary | ICD-10-CM | POA: Diagnosis not present

## 2012-01-04 DIAGNOSIS — N289 Disorder of kidney and ureter, unspecified: Secondary | ICD-10-CM

## 2012-01-04 LAB — CULTURE, RESPIRATORY W GRAM STAIN

## 2012-01-04 LAB — BASIC METABOLIC PANEL
BUN: 44 mg/dL — ABNORMAL HIGH (ref 6–23)
CO2: 22 mEq/L (ref 19–32)
Calcium: 8.3 mg/dL — ABNORMAL LOW (ref 8.4–10.5)
Creatinine, Ser: 2.14 mg/dL — ABNORMAL HIGH (ref 0.50–1.35)
GFR calc non Af Amer: 29 mL/min — ABNORMAL LOW (ref >90)
Glucose, Bld: 108 mg/dL — ABNORMAL HIGH (ref 70–99)

## 2012-01-04 LAB — CBC
HCT: 38 % — ABNORMAL LOW (ref 39.0–52.0)
MCHC: 32.6 g/dL (ref 30.0–36.0)
MCV: 83.3 fL (ref 78.0–100.0)
Platelets: 135 10*3/uL — ABNORMAL LOW (ref 150–400)
RDW: 16.2 % — ABNORMAL HIGH (ref 11.5–15.5)

## 2012-01-04 NOTE — Progress Notes (Signed)
Patient unintentionally removed central line and a piv. Patient had dopamine running at at the current time. Deterding MD made aware. Another piv was started. Currently off dopamine and will closely monitor if need to be restarted.   Kevin [name]
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
## 2012-01-04 NOTE — Progress Notes (Signed)
ANTICOAGULATION CONSULT NOTE - Follow Up Consult  Pharmacy Consult for Heparin Indication: pulmonary embolus B/L, R DVT - new  No Known Allergies  Patient Measurements: Height: 5\' 8"  (172.7 cm) Weight: 216 lb 4.8 oz (98.113 kg) IBW/kg (Calculated) : 68.4  Heparin Dosing Weight: 80 kg  Vital Signs: Temp: 97.6 F (36.4 C) (10/22 0355) Temp src: Oral (10/22 0355) BP: 87/59 mmHg (10/22 0710) Pulse Rate: 63  (10/22 0710)  Labs:  Basename 01/04/12 0455 01/03/12 2108 01/03/12 0756 01/03/12 0430 01/02/12 2312 01/02/12 1830 01/02/12 1540 01/02/12 1130 01/02/12 0715  HGB 12.4* -- -- 12.5* -- -- -- -- --  HCT 38.0* -- -- 38.0* -- -- -- -- 41.6  PLT 135* -- -- 140* -- -- -- -- 183  APTT -- -- -- -- -- -- -- -- --  LABPROT -- -- 16.5* -- -- -- -- -- --  INR -- -- 1.37 -- -- -- -- -- --  HEPARINUNFRC 0.31 0.83* 1.12* -- -- -- -- -- --  CREATININE 2.14* -- -- 2.38* -- -- 2.91* -- --  CKTOTAL -- -- -- -- -- -- 184 -- --  CKMB -- -- -- -- -- -- -- -- --  TROPONINI -- -- -- -- 1.33* 1.14* -- 1.02* --   Estimated Creatinine Clearance: 36 ml/min (by C-G formula based on Cr of 2.14).  Assessment: 71 yo male admitted with ischemic left leg with plans for BKA.  It has also been confirmed that he has a B/L PE and R DVT.  He had an IVC filter placed 10/21.  Heparin drip 1050 uts/hr HL 0.31 which is at low end of range with big drop over night.  There is no bleeding noted and h/h and pltc stable. I would like to keep more in middle of theraputic range given multiple clots.    Goal of Therapy:  Heparin level 0.3-0.7 units/ml Monitor platelets by anticoagulation protocol: Yes   Plan:   - Will increase his Heparin to 1200 units/hr  - F/U AM labs and adjust as needed  - Monitor for bleeding complications  Leota Sauers Pharm.D. CPP, BCPS Clinical Pharmacist [phone] 01/04/2012 8:39 AM

## 2012-01-04 NOTE — Progress Notes (Addendum)
[facility]  Subjective:  Awake, alert, thinks he may go to OR today for AKA He's not on OR schedule   Objective: Vital signs in last 24 hours: Blood pressure 87/59, pulse 63, temperature 97.6 F (36.4 C), temperature source Oral, resp. rate 19, height 5\' 8"  (1.727 m), weight 98.113 kg (216 lb 4.8 oz), SpO2 100.00%.    PHYSICAL EXAM General--awake, alert Chest--clear Heart--no rub Abd--nontender Extr--Left leg with superficial skin loss, bluish discoloration mid tib area, some mottling/discoloration to the knee. Left first toe is dark. Leg is cool the the knee. Right leg with old healed ulcerations, trace edema   Lab Results:   Lab 01/04/12 0455 01/03/12 0430 01/02/12 1540  NA 139 135 136  K 5.0 4.4 4.5  CL 106 105 102  CO2 22 21 21   BUN 44* 49* 54*  CREATININE 2.14* 2.38* 2.91*  ALB -- -- --  GLUCOSE 108* -- --  CALCIUM 8.3* 7.8* 7.8*  PHOS -- -- --     Basename 01/04/12 0455 01/03/12 0430  WBC 10.4 14.9*  HGB 12.4* 12.5*  HCT 38.0* 38.0*  PLT 135* 140*   I have reviewed the patient's current medications.  Assessment/Plan: 1. AKI in the setting of ischemic leg, hypotension with ACE on board and use of NSAIDS (clinoril and ibuprofen) up to the time of admission. Cr is now decreasing. Dopamineoff. NSoff. CK not markedly elevated (184). Good urine output with foley cath. renal US shows 9.5 cm R and 11.5 cm L kidney (hypoechoic cyst seen in upper pole of R kidney--radiology (Dr. Llana Aliment) rec. repeat ultrasound in 3 months and ask for doppler images of lesion, SPEP, and UPEP--pending.  Renal fct improving.  I will sign off--discussed with Dr. Craige Cotta.  Please call if renal help needed again  2. Ischemic left leg - for possible AKA --not sure when this is planned  3. Hypotension (with h/o HTN) meds d/c'd--BP better now  4. NICM--LVEF 50-55%  5. RV dysfunction - newly recognized - per Dr. Johney Frame 6.  DVT/PE--IVC filter placed yesterday     LOS: 4 days    Denelda Akerley F 01/04/2012,8:31 AM   .labalb

## 2012-01-04 NOTE — Progress Notes (Signed)
Name: Kevin [name] MRN: [id number] DOB: August 31, 1940    LOS: 4 Requesting MD: [facility] Regarding:HJypotension in setting of lower extremity ischemia and cellulitis.     History of Present Illness: 71 yo male with a hx of HTN, CRI(unknown base creatine but 1.59 on 10-18), chronic chf with reduced EF of 20%. He presented to [facility] ED 10-18 with left great toe gangrene , ischemic of lower extremities and evaluated by VVS. 10-19 while on floor he had hypotension and was transferred to ICU with presumed sepsis, increased  renal dysfunction and PCCM was asked to assume his care 10-19.  Lines / Drains: 10-20 Left Rt J>>10-21  Cultures: 10-20 bc x 2>> 10-20 UC>>negative 10-20 sputum>>  Antibiotics: 10-18 vanc>> 10-18 zoysn>>  Tests: 10/20 Echo>>EF 50 to 55%, severe RV dilation, mod/severe RA dilation, PAS 34 mmHg 10/21 V/Q>>>Matched defect within the superior segment of right lower lobe and heterogeneous perfusion to the left lung. Intermediate probability acute pulmonary embolism. 10/21 Doppler b/l legs>>acute DVT of Rt common femoral, profunda, femoral, popliteal, posterior tibial, and peroneal veins. 10/21 Renal U/S>>1.9 x 1.3 x 1.3 cm lesion upper pole Rt kidney  Events: 10/21 IVC filter placed by VVS 10/22 Off pressors  Subjective: Denies chest pain, dyspnea, or abdominal pain.  Vital Signs: Temp:  [97.1 F (36.2 C)-98 F (36.7 C)] 97.6 F (36.4 C) (10/22 0355) Pulse Rate:  [31-101] 63  (10/22 0710) Resp:  [0-34] 19  (10/22 0710) BP: (65-147)/(34-112) 87/59 mmHg (10/22 0710) SpO2:  [92 %-100 %] 100 % (10/22 0710) I/O last 3 completed shifts: In: 3016.2 [I.V.:2878.7; IV Piggyback:137.5] Out: 3131 [Urine:3125; Stool:1; Blood:5]  Physical Examination: General - no distress HEENT - no sinus tenderness Cardiac - s1s2 no  murmur Chest - no wheeze Abd - soft, non tender Ext - ischemic changes to Lt foot Neuro - normal strength  Labs and Imaging:   Lab 01/04/12 0455 01/03/12 0430 01/02/12 1540  NA 139 135 136  K 5.0 4.4 4.5  CL 106 105 102  CO2 22 21 21   BUN 44* 49* 54*  CREATININE 2.14* 2.38* 2.91*  GLUCOSE 108* 143* 181*    Lab 01/04/12 0455 01/03/12 0430 01/02/12 0715  HGB 12.4* 12.5* 14.0  HCT 38.0* 38.0* 41.6  WBC 10.4 14.9* 19.3*  PLT 135* 140* 183    Assessment and Plan:  Shock>>likely from sepsis and acute PE. Improved 10/22 and off pressors.  CVL dislodged 10/21. P: Pressors as needed to keep SBP > 90  NSTEMI with Hx of CAD, HTN. P: Per cardiology  Gangrene Lt leg with cellulitis. P: Continue vancomycin, zosyn Surgical intervention per VVS>>tentative plan for 10/22  Acute PE and Rt leg DVT P: Continue heparin gtt Will eventually need to transition to oral anti-cougulation with removal of IVC filter  Acute renal insufficiency. Base creatine 1.5.  Likely from hypotension, ACE, and NSAIDS.  Improving. P: F/u renal fx, urine outpt, electrolytes.  Rt kidney lesion P: Will need further evaluation with MRI when more stable Renal lesion on Right  kidney ?CA  BPH P: Continue avodart  Coralyn Helling, MD [facility] Pulmonary/Critical Care 01/04/2012, 8:06 AM Pager:  [phone] After 3pm call: [phone]

## 2012-01-04 NOTE — Progress Notes (Signed)
No real complaints   Filed Vitals:   01/04/12 0800 01/04/12 0843 01/04/12 0900 01/04/12 1000  BP: 79/51  97/60 90/65  Pulse: 56  65 75  Temp:  97.4 F (36.3 C)    TempSrc:  Oral    Resp: 18  17 16  Height:      Weight:      SpO2: 100%  98% 100%   Left leg progressively worse Blistering infarction of entire foot Overall seems to be improved from PE, IVC filter by Dr Early yesterday  Will proceed with left AKA tomorrow Procedure discussed with pt  NPO p midnight D/c heparin on call to OR  Charles Fields, MD [facility] [place] Office: [phone] Pager: [phone]  

## 2012-01-05 ENCOUNTER — Inpatient Hospital Stay (HOSPITAL_COMMUNITY): Payer: Medicare Other | Admitting: Anesthesiology

## 2012-01-05 ENCOUNTER — Encounter (HOSPITAL_COMMUNITY): Payer: Self-pay | Admitting: Anesthesiology

## 2012-01-05 ENCOUNTER — Encounter (HOSPITAL_COMMUNITY)
Admission: EM | Disposition: A | Discharge status: Rehabilitation Facility | Payer: Self-pay | Source: Home / Self Care | Attending: Internal Medicine

## 2012-01-05 DIAGNOSIS — I2699 Other pulmonary embolism without acute cor pulmonale: Secondary | ICD-10-CM

## 2012-01-05 DIAGNOSIS — I2510 Atherosclerotic heart disease of native coronary artery without angina pectoris: Secondary | ICD-10-CM

## 2012-01-05 DIAGNOSIS — I70269 Atherosclerosis of native arteries of extremities with gangrene, unspecified extremity: Secondary | ICD-10-CM

## 2012-01-05 HISTORY — PX: AMPUTATION: SHX166

## 2012-01-05 LAB — PROTEIN ELECTROPH W RFLX QUANT IMMUNOGLOBULINS
Alpha-1-Globulin: 7.5 % — ABNORMAL HIGH (ref 2.9–4.9)
Beta 2: 5.9 % (ref 3.2–6.5)
Gamma Globulin: 19.5 % — ABNORMAL HIGH (ref 11.1–18.8)
M-Spike, %: NOT DETECTED g/dL

## 2012-01-05 LAB — UIFE/LIGHT CHAINS/TP QN, 24-HR UR
Alpha 2, Urine: DETECTED — AB
Free Kappa Lt Chains,Ur: 24.6 mg/dL — ABNORMAL HIGH (ref 0.14–2.42)
Free Kappa/Lambda Ratio: 10.6 ratio — ABNORMAL HIGH (ref 2.04–10.37)
Free Lambda Lt Chains,Ur: 2.32 mg/dL — ABNORMAL HIGH (ref 0.02–0.67)
Total Protein, Urine: 38.2 mg/dL

## 2012-01-05 LAB — HEPARIN LEVEL (UNFRACTIONATED): Heparin Unfractionated: 0.31 IU/mL (ref 0.30–0.70)

## 2012-01-05 LAB — CBC
HCT: 37.4 % — ABNORMAL LOW (ref 39.0–52.0)
MCH: 26.7 pg (ref 26.0–34.0)
MCV: 83.1 fL (ref 78.0–100.0)
Platelets: 136 10*3/uL — ABNORMAL LOW (ref 150–400)
RBC: 4.5 MIL/uL (ref 4.22–5.81)

## 2012-01-05 LAB — BASIC METABOLIC PANEL
BUN: 38 mg/dL — ABNORMAL HIGH (ref 6–23)
CO2: 20 mEq/L (ref 19–32)
Calcium: 8.4 mg/dL (ref 8.4–10.5)
Creatinine, Ser: 1.79 mg/dL — ABNORMAL HIGH (ref 0.50–1.35)

## 2012-01-05 SURGERY — AMPUTATION, ABOVE KNEE
Anesthesia: General | Site: Leg Upper | Laterality: Left | Wound class: Clean

## 2012-01-05 MED ORDER — ONDANSETRON HCL 4 MG/2ML IJ SOLN
INTRAMUSCULAR | Status: DC | PRN
  Administered 2012-01-05: 4 mg via INTRAVENOUS

## 2012-01-05 MED ORDER — NEOSTIGMINE METHYLSULFATE 1 MG/ML IJ SOLN
INTRAMUSCULAR | Status: DC | PRN
  Administered 2012-01-05: 3 mg via INTRAVENOUS

## 2012-01-05 MED ORDER — LACTATED RINGERS IV SOLN
INTRAVENOUS | Status: DC
  Administered 2012-01-05: 11:00:00 via INTRAVENOUS

## 2012-01-05 MED ORDER — 0.9 % SODIUM CHLORIDE (POUR BTL) OPTIME
TOPICAL | Status: DC | PRN
  Administered 2012-01-05: 1000 mL

## 2012-01-05 MED ORDER — HYDROMORPHONE HCL PF 1 MG/ML IJ SOLN
0.2500 mg | INTRAMUSCULAR | Status: DC | PRN
  Administered 2012-01-05: 1 mg via INTRAVENOUS

## 2012-01-05 MED ORDER — ETOMIDATE 2 MG/ML IV SOLN
INTRAVENOUS | Status: DC | PRN
  Administered 2012-01-05: 16 mg via INTRAVENOUS

## 2012-01-05 MED ORDER — HYDRALAZINE HCL 20 MG/ML IJ SOLN
INTRAMUSCULAR | Status: AC
  Filled 2012-01-05: qty 1

## 2012-01-05 MED ORDER — BACLOFEN 20 MG PO TABS
20.0000 mg | ORAL_TABLET | Freq: Four times a day (QID) | ORAL | Status: DC | PRN
  Administered 2012-01-06 – 2012-01-10 (×2): 20 mg via ORAL
  Filled 2012-01-05 (×2): qty 1

## 2012-01-05 MED ORDER — LIDOCAINE HCL (CARDIAC) 20 MG/ML IV SOLN
INTRAVENOUS | Status: DC | PRN
  Administered 2012-01-05: 80 mg via INTRAVENOUS

## 2012-01-05 MED ORDER — FENTANYL CITRATE 0.05 MG/ML IJ SOLN
25.0000 ug | INTRAMUSCULAR | Status: DC | PRN
  Administered 2012-01-05 (×2): 50 ug via INTRAVENOUS
  Filled 2012-01-05: qty 2

## 2012-01-05 MED ORDER — HYDRALAZINE HCL 20 MG/ML IJ SOLN
5.0000 mg | Freq: Once | INTRAMUSCULAR | Status: AC
  Administered 2012-01-05: 5 mg via INTRAVENOUS

## 2012-01-05 MED ORDER — LACTATED RINGERS IV SOLN
INTRAVENOUS | Status: DC | PRN
  Administered 2012-01-05 (×2): via INTRAVENOUS

## 2012-01-05 MED ORDER — FENTANYL CITRATE 0.05 MG/ML IJ SOLN
INTRAMUSCULAR | Status: DC | PRN
  Administered 2012-01-05: 50 ug via INTRAVENOUS
  Administered 2012-01-05: 100 ug via INTRAVENOUS
  Administered 2012-01-05: 50 ug via INTRAVENOUS

## 2012-01-05 MED ORDER — ROCURONIUM BROMIDE 100 MG/10ML IV SOLN
INTRAVENOUS | Status: DC | PRN
  Administered 2012-01-05: 40 mg via INTRAVENOUS

## 2012-01-05 MED ORDER — OXYCODONE HCL 5 MG PO TABS
5.0000 mg | ORAL_TABLET | ORAL | Status: DC | PRN
  Administered 2012-01-05 – 2012-01-10 (×16): 5 mg via ORAL
  Filled 2012-01-05 (×16): qty 1

## 2012-01-05 MED ORDER — GLYCOPYRROLATE 0.2 MG/ML IJ SOLN
INTRAMUSCULAR | Status: DC | PRN
  Administered 2012-01-05: 0.4 mg via INTRAVENOUS

## 2012-01-05 SURGICAL SUPPLY — 50 items
BANDAGE ELASTIC 6 VELCRO ST LF (GAUZE/BANDAGES/DRESSINGS) ×2 IMPLANT
BANDAGE GAUZE ELAST BULKY 4 IN (GAUZE/BANDAGES/DRESSINGS) ×2 IMPLANT
BLADE SAW RECIP 87.9 MT (BLADE) ×1 IMPLANT
BNDG COHESIVE 4X5 TAN STRL (GAUZE/BANDAGES/DRESSINGS) ×1 IMPLANT
BNDG COHESIVE 6X5 TAN STRL LF (GAUZE/BANDAGES/DRESSINGS) ×1 IMPLANT
CANISTER SUCTION 2500CC (MISCELLANEOUS) ×2 IMPLANT
CLIP TI MEDIUM 6 (CLIP) IMPLANT
CLOTH BEACON ORANGE TIMEOUT ST (SAFETY) ×2 IMPLANT
COVER SURGICAL LIGHT HANDLE (MISCELLANEOUS) ×2 IMPLANT
COVER TABLE BACK 60X90 (DRAPES) ×1 IMPLANT
DRAIN CHANNEL 19F RND (DRAIN) IMPLANT
DRAPE ORTHO SPLIT 77X108 STRL (DRAPES) ×4
DRAPE PROXIMA HALF (DRAPES) ×3 IMPLANT
DRAPE SURG ORHT 6 SPLT 77X108 (DRAPES) ×2 IMPLANT
DRSG ADAPTIC 3X8 NADH LF (GAUZE/BANDAGES/DRESSINGS) ×1 IMPLANT
ELECT REM PT RETURN 9FT ADLT (ELECTROSURGICAL) ×2
ELECTRODE REM PT RTRN 9FT ADLT (ELECTROSURGICAL) ×1 IMPLANT
EVACUATOR SILICONE 100CC (DRAIN) IMPLANT
GAUZE XEROFORM 5X9 LF (GAUZE/BANDAGES/DRESSINGS) ×1 IMPLANT
GLOVE BIO SURGEON STRL SZ 6 (GLOVE) ×1 IMPLANT
GLOVE BIO SURGEON STRL SZ 6.5 (GLOVE) ×2 IMPLANT
GLOVE BIO SURGEON STRL SZ7.5 (GLOVE) ×1 IMPLANT
GLOVE BIOGEL PI IND STRL 6.5 (GLOVE) IMPLANT
GLOVE BIOGEL PI IND STRL 7.0 (GLOVE) IMPLANT
GLOVE BIOGEL PI INDICATOR 6.5 (GLOVE) ×1
GLOVE BIOGEL PI INDICATOR 7.0 (GLOVE) ×1
GLOVE SS BIOGEL STRL SZ 7.5 (GLOVE) IMPLANT
GLOVE SUPERSENSE BIOGEL SZ 7.5 (GLOVE) ×1
GOWN PREVENTION PLUS XLARGE (GOWN DISPOSABLE) ×2 IMPLANT
GOWN STRL NON-REIN LRG LVL3 (GOWN DISPOSABLE) ×5 IMPLANT
KIT BASIN OR (CUSTOM PROCEDURE TRAY) ×2 IMPLANT
KIT ROOM TURNOVER OR (KITS) ×2 IMPLANT
NS IRRIG 1000ML POUR BTL (IV SOLUTION) ×2 IMPLANT
PACK GENERAL/GYN (CUSTOM PROCEDURE TRAY) ×2 IMPLANT
PAD ARMBOARD 7.5X6 YLW CONV (MISCELLANEOUS) ×4 IMPLANT
SAW GIGLI STERILE 20 (MISCELLANEOUS) ×1 IMPLANT
SPONGE GAUZE 4X4 12PLY (GAUZE/BANDAGES/DRESSINGS) ×2 IMPLANT
STAPLER VISISTAT 35W (STAPLE) ×2 IMPLANT
STOCKINETTE IMPERVIOUS LG (DRAPES) ×2 IMPLANT
SUT ETHILON 3 0 PS 1 (SUTURE) IMPLANT
SUT SILK 2 0 SH (SUTURE) IMPLANT
SUT SILK 2 0 SH CR/8 (SUTURE) ×4 IMPLANT
SUT SILK 2 0 TIES 10X30 (SUTURE) ×2 IMPLANT
SUT VIC AB 2-0 SH 18 (SUTURE) ×4 IMPLANT
SUT VIC AB 3-0 SH 27 (SUTURE) ×4
SUT VIC AB 3-0 SH 27X BRD (SUTURE) ×2 IMPLANT
TOWEL OR 17X24 6PK STRL BLUE (TOWEL DISPOSABLE) ×2 IMPLANT
TOWEL OR 17X26 10 PK STRL BLUE (TOWEL DISPOSABLE) ×2 IMPLANT
UNDERPAD 30X30 INCONTINENT (UNDERPADS AND DIAPERS) ×2 IMPLANT
WATER STERILE IRR 1000ML POUR (IV SOLUTION) ×2 IMPLANT

## 2012-01-05 NOTE — Progress Notes (Signed)
Name: Kevin [name] MRN: [id number] DOB: 1940-10-15    LOS: 5 Requesting MD: [facility] Regarding:Hypotension in setting of lower extremity ischemia and cellulitis.     History of Present Illness: 71 yo male with a hx of HTN, CRI(unknown base creatine but 1.59 on 10-18), chronic chf with reduced EF of 20%. He presented to [facility] ED 10-18 with left great toe gangrene , ischemia of lower extremities and evaluated by VVS. 10-19 while on floor he had hypotension and was transferred to ICU with presumed sepsis, increased renal dysfunction and PCCM was asked to assume his care 10-19.  Lines / Drains: 10-20 Left Rt J>>10-21  Cultures: 10-20 bc x 2>> 10-20 UC>>negative 10-20 sputum>>few WBC, and squamous epithelial cells  Antibiotics: 10-18 vanc>> 10-18 zoysn>>  Tests: 10/20 Echo>>EF 50 to 55%, severe RV dilation, mod/severe RA dilation, PAS 34 mmHg 10/21 V/Q>>>Matched defect within the superior segment of right lower lobe and heterogeneous perfusion to the left lung. Intermediate probability acute pulmonary embolism. 10/21 Doppler b/l legs>>acute DVT of Rt common femoral, profunda, femoral, popliteal, posterior tibial, and peroneal veins. 10/21 Renal U/S>>1.9 x 1.3 x 1.3 cm lesion upper pole Rt kidney  Events: 10/21 IVC filter placed by VVS 10/22 Off pressors  Subjective: Denies chest pain, dyspnea, or abdominal pain.  Vital Signs: Temp:  [97.4 F (36.3 C)-98.3 F (36.8 C)] 97.5 F (36.4 C) (10/23 0734) Pulse Rate:  [37-140] 81  (10/23 0700) Resp:  [16-23] 17  (10/23 0700) BP: (86-128)/(47-97) 114/54 mmHg (10/23 0700) SpO2:  [95 %-100 %] 99 % (10/23 0700) I/O last 3 completed shifts: In: 1819.2 [I.V.:1406.7; IV Piggyback:412.5] Out: 2320 [Urine:2320]  Physical Examination: General - no distress HEENT - intact, and moist. no  sinus tenderness Cardiac - s1s2 no murmur PVCs on monitor Chest - clear bilaterally, no wheezes Abd - soft, non tender, active bowel soudns Ext - ischemic changes to Lt foot, Rt foot cool to touch, weak pulse Neuro - normal strength, except in left foot  Labs and Imaging:   Lab 01/05/12 0425 01/04/12 0455 01/03/12 0430  NA 136 139 135  K 5.1 5.0 4.4  CL 106 106 105  CO2 20 22 21   BUN 38* 44* 49*  CREATININE 1.79* 2.14* 2.38*  GLUCOSE 103* 108* 143*    Lab 01/05/12 0425 01/04/12 0455 01/03/12 0430  HGB 12.0* 12.4* 12.5*  HCT 37.4* 38.0* 38.0*  WBC 12.0* 10.4 14.9*  PLT 136* 135* 140*    Assessment and Plan:  Shock>>likely from sepsis and acute PE. Improved 10/22 and off pressors.  CVL dislodged 10/21. P: Continue to monitor  NSTEMI with Hx of CAD, HTN. - elevated troponin thought to be related to PE and RV strain P: Per cardiology Will continue heparin and plan for oral anticoag.  Gangrene Lt leg with cellulitis - WBC increased slightly P: Continue vancomycin, zosyn VVS following,  plan for AKA today  Acute PE and Rt leg DVT P: Continue heparin gtt, will d/c pet VVS for OR Will eventually need to transition to oral anti-cougulation with removal of IVC filter  Acute renal insufficiency - Base creatine 1.5, Likely from hypotension, ACE, and NSAIDS. Creatinine improved, potassium stable, making urine P: F/u renal fx, urine outpt, electrolytes.  Rt kidney lesion P: Will need further evaluation with MRI when more stable Renal lesion on Right kidney ?CA  BPH P: Continue avodart  Pt to OR today for AKA.  Will f/u post-op.  If stable, then likely transfer to SDU and [facility] on 10/24.  Coralyn Helling, MD [facility] Pulmonary/Critical Care 01/05/2012, 3:06 PM Pager:  [phone] After 3pm call: [phone]

## 2012-01-05 NOTE — Anesthesia Procedure Notes (Signed)
Procedure Name: Intubation Date/Time: 01/05/2012 2:14 PM Performed by: Jerilee Hoh Pre-anesthesia Checklist: Patient identified, Emergency Drugs available, Suction available and Patient being monitored Patient Re-evaluated:Patient Re-evaluated prior to inductionOxygen Delivery Method: Circle system utilized Preoxygenation: Pre-oxygenation with 100% oxygen Intubation Type: IV induction Ventilation: Mask ventilation without difficulty and Oral airway inserted - appropriate to patient size Laryngoscope Size: Mac and 4 Grade View: Grade II Tube type: Oral Tube size: 7.5 mm Number of attempts: 1 Airway Equipment and Method: Stylet Placement Confirmation: ETT inserted through vocal cords under direct vision,  positive ETCO2 and breath sounds checked- equal and bilateral Secured at: 22 cm Dental Injury: Teeth and Oropharynx as per pre-operative assessment

## 2012-01-05 NOTE — Op Note (Signed)
OPERATIVE REPORT  DATE OF SURGERY: 01/05/2012  PATIENT: Kevin [name], 71 y.o. male MRN: [id number]  DOB: Mar 02, 1941  PRE-OPERATIVE DIAGNOSIS: Left leg ischemia with nonviable foot  POST-OPERATIVE DIAGNOSIS:  Same  PROCEDURE: Left above-knee amputation  SURGEON:  Gretta Began, M.D.  PHYSICIAN ASSISTANT: Rhyne  ANESTHESIA:  Gen.  EBL: 100 ml  Total I/O In: 101 [I.V.:76; IV Piggyback:25] Out: 160 [Urine:160]  BLOOD ADMINISTERED: None  DRAINS: None  SPECIMEN: Left above-knee amputation  COUNTS CORRECT:  YES  PLAN OF CARE: PACU   PATIENT DISPOSITION:  PACU - hemodynamically stable  PROCEDURE DETAILS: Patient sitting up in place supervision an area of the left leg sterile fashion. Using a fishmouth incision the skin and fascia was opened with electrocautery. The muscle bodies were divided in line with the skin incision. The muscles were controlled with hemostats and divided. The superficial femoral artery and femoral vein were were occluded with hemostats and divided. The sciatic nerve was proximally divided and ligated with 0 Vicryl ties. This was brought ligated with 0 Vicryl ties as well. Periosteum was elevated off the femur reamer was divided with a Gigli saw her the specimen was passed off the field. The end of the femur was smoothed with a bone rasp. We the wound was irrigated with saline. Hemostasis was obtained electrocautery. The posterior fascia was closed to the anterior fascia with interrupted figure-of-eight 0 Vicryl sutures. The wound is again irrigated and the skin was closed with skin clips. Sterile dressing and Ace wrap were applied the patient was taken to the recovery room in stable condition   Gretta Began, M.D. 01/05/2012 3:22 PM

## 2012-01-05 NOTE — Transfer of Care (Signed)
Immediate Anesthesia Transfer of Care Note  Patient: Kevin [name]  Procedure(s) Performed: Procedure(s) (LRB) with comments: AMPUTATION ABOVE KNEE (Left)  Patient Location: PACU  Anesthesia Type: General  Level of Consciousness: awake, alert , oriented and patient cooperative  Airway & Oxygen Therapy: Patient Spontanous Breathing and Patient connected to face mask oxygen  Post-op Assessment: Report given to PACU RN, Post -op Vital signs reviewed and stable and Patient moving all extremities  Post vital signs: Reviewed and stable  Complications: No apparent anesthesia complications

## 2012-01-05 NOTE — H&P (View-Only) (Signed)
No real complaints   Filed Vitals:   01/04/12 0800 01/04/12 0843 01/04/12 0900 01/04/12 1000  BP: 79/51  97/60 90/65  Pulse: 56  65 75  Temp:  97.4 F (36.3 C)    TempSrc:  Oral    Resp: 18  17 16   Height:      Weight:      SpO2: 100%  98% 100%   Left leg progressively worse Blistering infarction of entire foot Overall seems to be improved from PE, IVC filter by Dr Early yesterday  Will proceed with left AKA tomorrow Procedure discussed with pt  NPO p midnight D/c heparin on call to OR  Fabienne Bruns, MD [facility] [place] Office: [phone] Pager: [phone]

## 2012-01-05 NOTE — Anesthesia Preprocedure Evaluation (Addendum)
Anesthesia Evaluation  Patient identified by MRN, date of birth, ID band Patient awake    Reviewed: Allergy & Precautions, H&P , NPO status , Patient's Chart, lab work & pertinent test results  Airway Mallampati: II      Dental   Pulmonary neg pulmonary ROS,  breath sounds clear to auscultation        Cardiovascular hypertension, Pt. on medications + CAD and +CHF Rhythm:Regular Rate:Normal     Neuro/Psych    GI/Hepatic negative GI ROS, Neg liver ROS,   Endo/Other  negative endocrine ROS  Renal/GU negative Renal ROS     Musculoskeletal   Abdominal   Peds  Hematology negative hematology ROS (+)   Anesthesia Other Findings   Reproductive/Obstetrics                          Anesthesia Physical Anesthesia Plan  ASA: IV  Anesthesia Plan: General   Post-op Pain Management:    Induction: Intravenous  Airway Management Planned: Oral ETT  Additional Equipment:   Intra-op Plan:   Post-operative Plan: Possible Post-op intubation/ventilation  Informed Consent:   Plan Discussed with: CRNA, Anesthesiologist and Surgeon  Anesthesia Plan Comments:        Anesthesia Quick Evaluation

## 2012-01-05 NOTE — Preoperative (Signed)
Beta Blockers   Reason not to administer Beta Blockers:Not Applicable 

## 2012-01-05 NOTE — Interval H&P Note (Signed)
History and Physical Interval Note:  01/05/2012 1:26 PM  Kevin [name]  has presented today for surgery, with the diagnosis of PVD  The various methods of treatment have been discussed with the patient and family. After consideration of risks, benefits and other options for treatment, the patient has consented to  Procedure(s) (LRB) with comments: AMPUTATION ABOVE KNEE (Left) as a surgical intervention .  The patient's history has been reviewed, patient examined, no change in status, stable for surgery.  I have reviewed the patient's chart and labs.  Questions were answered to the patient's satisfaction.   Dr Darrick Penna involved in emergent surgery.  Discussed with pt who wishes for me to procede with L AKA Kevin [name]

## 2012-01-05 NOTE — Anesthesia Postprocedure Evaluation (Signed)
  Anesthesia Post-op Note  Patient: Kevin [name]  Procedure(s) Performed: Procedure(s) (LRB) with comments: AMPUTATION ABOVE KNEE (Left)  Patient Location: PACU  Anesthesia Type: General  Level of Consciousness: awake  Airway and Oxygen Therapy: Patient Spontanous Breathing  Post-op Pain: mild  Post-op Assessment: Post-op Vital signs reviewed  Post-op Vital Signs: Reviewed  Complications: No apparent anesthesia complications

## 2012-01-05 NOTE — OR Nursing (Signed)
Patient Assessment in holding area by Darlina Rumpf, RN.

## 2012-01-06 ENCOUNTER — Telehealth: Payer: Self-pay | Admitting: Vascular Surgery

## 2012-01-06 ENCOUNTER — Encounter (HOSPITAL_COMMUNITY): Payer: Self-pay | Admitting: Vascular Surgery

## 2012-01-06 DIAGNOSIS — L97909 Non-pressure chronic ulcer of unspecified part of unspecified lower leg with unspecified severity: Secondary | ICD-10-CM

## 2012-01-06 DIAGNOSIS — M25476 Effusion, unspecified foot: Secondary | ICD-10-CM

## 2012-01-06 LAB — CBC
Hemoglobin: 11.8 g/dL — ABNORMAL LOW (ref 13.0–17.0)
MCHC: 33.6 g/dL (ref 30.0–36.0)

## 2012-01-06 LAB — BASIC METABOLIC PANEL
GFR calc Af Amer: 51 mL/min — ABNORMAL LOW (ref >90)
GFR calc non Af Amer: 44 mL/min — ABNORMAL LOW (ref >90)
Glucose, Bld: 105 mg/dL — ABNORMAL HIGH (ref 70–99)
Potassium: 4.1 mEq/L (ref 3.5–5.1)
Sodium: 131 mEq/L — ABNORMAL LOW (ref 135–145)

## 2012-01-06 MED ORDER — SODIUM CHLORIDE 0.9 % IV SOLN
INTRAVENOUS | Status: DC
  Administered 2012-01-06: 500 mL via INTRAVENOUS

## 2012-01-06 MED ORDER — HEPARIN (PORCINE) IN NACL 100-0.45 UNIT/ML-% IJ SOLN
1200.0000 [IU]/h | INTRAMUSCULAR | Status: DC
  Filled 2012-01-06: qty 250

## 2012-01-06 MED ORDER — ENOXAPARIN SODIUM 100 MG/ML ~~LOC~~ SOLN
100.0000 mg | Freq: Two times a day (BID) | SUBCUTANEOUS | Status: DC
  Administered 2012-01-06 – 2012-01-10 (×8): 100 mg via SUBCUTANEOUS
  Filled 2012-01-06 (×9): qty 1

## 2012-01-06 NOTE — Evaluation (Signed)
Physical Therapy Evaluation Patient Details Name: Kevin [name] MRN: [id number] DOB: [birth year] Today's Date: 01/06/2012 Time: 4098-1191 PT Time Calculation (min): 30 min  PT Assessment / Plan / Recommendation Clinical Impression  pt rpesents with L AKA.  pt very debilitated and seems to have been declining for quite some time at home.  pt has good support from his son and nephew, but would benefit from CIR prior to D/C to home.      PT Assessment  Patient needs continued PT services    Follow Up Recommendations  Post acute inpatient    Does the patient have the potential to tolerate intense rehabilitation   Yes, Recommend IP Rehab Screening  Barriers to Discharge None      Equipment Recommendations  3 in 1 bedside comode    Recommendations for Other Services Rehab consult   Frequency Min 3X/week    Precautions / Restrictions Precautions Precautions: Fall Restrictions Weight Bearing Restrictions: No   Pertinent Vitals/Pain Indicates "tender" in residual limb.        Mobility  Bed Mobility Bed Mobility: Not assessed Transfers Transfers: Sit to Stand;Stand to Sit Sit to Stand: 1: +2 Total assist;From bed;With upper extremity assist Sit to Stand: Patient Percentage: 10% Stand to Sit: 1: +2 Total assist;To chair/3-in-1;With upper extremity assist Stand to Sit: Patient Percentage: 20% Details for Transfer Assistance: Pt. with significant difficulty moving sit to stand.  Pt maintains hips and knees flexed.  Requires physical assist and verbal cues to attempt to stand erect.  Only able to maintain for 5 seconds before he moves back into a flexed posture Ambulation/Gait Ambulation/Gait Assistance: Not tested (comment) Stairs: No Wheelchair Mobility Wheelchair Mobility: No    Shoulder Instructions     Exercises General Exercises - Upper Extremity Chair Push Up: Strengthening;10 reps;Seated   PT Diagnosis: Difficulty walking;Generalized weakness  PT Problem List:  Decreased strength;Decreased activity tolerance;Decreased balance;Decreased mobility;Decreased knowledge of use of DME PT Treatment Interventions: DME instruction;Gait training;Functional mobility training;Therapeutic activities;Therapeutic exercise;Balance training;Patient/family education   PT Goals Acute Rehab PT Goals PT Goal Formulation: With patient Time For Goal Achievement: 01/20/12 Potential to Achieve Goals: Good Pt will go Supine/Side to Sit: with modified independence PT Goal: Supine/Side to Sit - Progress: Goal set today Pt will go Sit to Supine/Side: with modified independence PT Goal: Sit to Supine/Side - Progress: Goal set today Pt will go Sit to Stand: with supervision PT Goal: Sit to Stand - Progress: Goal set today Pt will go Stand to Sit: with supervision PT Goal: Stand to Sit - Progress: Goal set today Pt will Transfer Bed to Chair/Chair to Bed: with supervision PT Transfer Goal: Bed to Chair/Chair to Bed - Progress: Goal set today Pt will Ambulate: 16 - 50 feet;with min assist;with rolling walker PT Goal: Ambulate - Progress: Goal set today  Visit Information  Last PT Received On: 01/06/12 Assistance Needed: +2 PT/OT Co-Evaluation/Treatment: Yes    Subjective Data  Subjective: I only use my W/C lately.   Patient Stated Goal: Home   Prior Functioning  Home Living Lives With: Family;Son (son and nephew) Available Help at Discharge: Family;Available 24 hours/day Type of Home: Apartment Home Access: Stairs to enter (plan for ramp) Entrance Stairs-Number of Steps: 4 Entrance Stairs-Rails: Can reach both;Left;Right Home Layout: One level Bathroom Shower/Tub: Health visitor: Handicapped height Bathroom Accessibility: Yes How Accessible: Accessible via wheelchair;Accessible via walker Home Adaptive Equipment: Grab bars around toilet;Grab bars in shower;Hand-held shower hose;Shower chair with back;Walker - rolling;Wheelchair - manual  Prior  Function Level of Independence: Needs assistance Needs Assistance: Meal Prep;Light Housekeeping;Transfers;Gait Meal Prep: Total Light Housekeeping: Total Gait Assistance: Used wheelchair as primary mode of locomotion Transfer Assistance: Transferred to/from wheelchair modified independently per pt report Able to Take Stairs?: Yes (with assist from nephew and son) Driving: No Vocation: Retired Musician: No difficulties    Cognition  Overall Cognitive Status: Appears within functional limits for tasks assessed/performed Arousal/Alertness: Awake/alert Orientation Level: Appears intact for tasks assessed Behavior During Session: [facility] for tasks performed    Extremity/Trunk Assessment Right Upper Extremity Assessment RUE ROM/Strength/Tone: Within functional levels RUE Sensation: WFL - Light Touch RUE Coordination: WFL - gross/fine motor Left Upper Extremity Assessment LUE ROM/Strength/Tone: Within functional levels LUE Sensation: WFL - Light Touch LUE Coordination: WFL - gross/fine motor Right Lower Extremity Assessment RLE ROM/Strength/Tone: Deficits RLE ROM/Strength/Tone Deficits: Grossly weak, 3+/5 RLE Sensation: WFL - Light Touch Left Lower Extremity Assessment LLE ROM/Strength/Tone: Deficits LLE ROM/Strength/Tone Deficits: New AKA.   LLE Sensation: Deficits LLE Sensation Deficits: Phantom sensations.   Trunk Assessment Trunk Assessment: Normal   Balance Balance Balance Assessed: Yes Static Standing Balance Static Standing - Balance Support: Bilateral upper extremity supported Static Standing - Level of Assistance: 1: +2 Total assist Static Standing - Comment/# of Minutes: pt maintains very flexed posture with flexed hips and knees.  Requires 2 person A to attempt to extend hips/trunk.    End of Session PT - End of Session Equipment Utilized During Treatment: Gait belt Activity Tolerance: Patient limited by fatigue Patient left: in chair;with call  bell/phone within reach Nurse Communication: Mobility status  GP     Sunny Schlein, [place] [phone] 01/06/2012, 2:55 PM

## 2012-01-06 NOTE — Telephone Encounter (Addendum)
Message copied by Shari Prows on Thu Jan 06, 2012 11:19 AM ------      Message from: Lorin Mercy K      Created: Thu Jan 06, 2012 10:06 AM      Regarding: schedule                   ----- Message -----         From: Dara Lords, PA         Sent: 01/05/2012   3:24 PM           To: Sharee Pimple, CMA            S/p left aka today 01/05/12.  F/u in 4 weeks for staple removal with TFE.            Thanks,      Lelon Mast  I scheduled an appt for the patient on 02/01/12 at 2:45pm. I was unable to reach the patient by telephone. No working ph#'s. I mailed the patient an appt letter. awt

## 2012-01-06 NOTE — Progress Notes (Signed)
ANTICOAGULATION CONSULT NOTE - Follow Up Consult  Pharmacy Consult for Heparin Indication: pulmonary embolus B/L, R DVT - new  No Known Allergies  Patient Measurements: Height: 5\' 8"  (172.7 cm) Weight: 212 lb 15.4 oz (96.6 kg) (Bedscale) IBW/kg (Calculated) : 68.4  Heparin Dosing Weight: 80 kg  Vital Signs: Temp: 98.1 F (36.7 C) (10/24 2059) Temp src: Oral (10/24 2059) BP: 115/74 mmHg (10/24 2059) Pulse Rate: 59  (10/24 2059)  Labs:  Basename 01/06/12 0405 01/05/12 0425 01/04/12 0455  HGB 11.8* 12.0* --  HCT 35.1* 37.4* 38.0*  PLT 126* 136* 135*  APTT -- -- --  LABPROT -- -- --  INR -- -- --  HEPARINUNFRC -- 0.31 0.31  CREATININE 1.53* 1.79* 2.14*  CKTOTAL -- -- --  CKMB -- -- --  TROPONINI -- -- --   Estimated Creatinine Clearance: 49.9 ml/min (by C-G formula based on Cr of 1.53).  Assessment: Order received to switch from IV heparin to SQ Lovenox for VTE treatment in this 71 yo male. RN reported patient only has 1 IV access and unable to obtain 2nd IV access thus MD switched to SQ Lovenox. 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 It has also been confirmed that he has a B/L PE and R DVT.  His H/H and platelets have been stable over the past few days, although they are all low. Pltc down to 126K.  Estimated CrCl ~ 50 ml/min,  Weight = 96.6kg.  No bleeding reported per RN.  Heparin drip stopped ~20:45.   Will start SQ lovenox 1 hr after heparin drip stopped.  Goal of Therapy:  Monitor platelets by anticoagulation protocol: Yes 4 hour heparin level = 0.6-1.2 units/ml (for lovenox 1mg /kg q12h dosing)   Plan:  Start Lovenox 1 hours after IV heparin drip stopped.  Lovenox 100 mg SQ q12h Monitor CBC  Noah Delaine, RPh Clinical Pharmacist Pager: [phone] 01/06/2012 9:20 PM

## 2012-01-06 NOTE — Progress Notes (Signed)
Pt admitted to room 4714.  Pt alert and oriented, VS wnl, and pt placed on tele.  Pt oriented to room, will continue to monitor.

## 2012-01-06 NOTE — Progress Notes (Signed)
Name: Kevin [name] MRN: [id number] DOB: 1940-08-22    LOS: 6 Requesting MD: [facility] Regarding:Hypotension in setting of lower extremity ischemia and cellulitis.   History of Present Illness: 71 yo male with a hx of HTN, CRI(unknown base creatine but 1.59 on 10-18), chronic chf with reduced EF of 20%. He presented to [facility] ED 10-18 with left great toe gangrene , ischemia of lower extremities and evaluated by VVS. 10-19 while on floor he had hypotension and was transferred to ICU with presumed sepsis, increased renal dysfunction and PCCM was asked to assume his care 10-19.  Lines / Drains: 10-20 Left Rt J>>10-21  Cultures: 10-20 bc x 2>> 10-20 UC>>negative 10-20 sputum>>oral flora  Antibiotics: 10-18 vanc>> 10-18 zoysn>>  Tests: 10/20 Echo>>EF 50 to 55%, severe RV dilation, mod/severe RA dilation, PAS 34 mmHg 10/21 V/Q>>>Matched defect within the superior segment of right lower lobe and heterogeneous perfusion to the left lung. Intermediate probability acute pulmonary embolism. 10/21 Doppler b/l legs>>acute DVT of Rt common femoral, profunda, femoral, popliteal, posterior tibial, and peroneal veins. 10/21 Renal U/S>>1.9 x 1.3 x 1.3 cm lesion upper pole Rt kidney  Events: 10/21 IVC filter placed by VVS 10/22 Off pressors 10/23 Left AKA  Subjective: Denies chest pain, dyspnea, or abdominal pain.  Sitting in chair.  Vital Signs: Temp:  [96.9 F (36.1 C)-99.7 F (37.6 C)] 98.8 F (37.1 C) (10/24 0359) Pulse Rate:  [34-115] 42  (10/24 0900) Resp:  [11-21] 20  (10/24 0900) BP: (90-170)/(52-110) 99/64 mmHg (10/24 0900) SpO2:  [95 %-100 %] 99 % (10/24 0900) I/O last 3 completed shifts: In: 2420 [P.O.:220; I.V.:2100; IV Piggyback:100] Out: 2525 [Urine:2475; Blood:50]  Physical Examination: General - no distress HEENT -  intact, and moist. no sinus tenderness Cardiac - s1s2 no murmur Chest - clear bilaterally, no wheezes Abd - soft, non tender, active bowel sounds Ext - Lt leg wound dressing clean, Rt foot cool to touch, weak pulse Neuro - normal strength  Labs and Imaging:   Lab 01/06/12 0405 01/05/12 0425 01/04/12 0455  NA 131* 136 139  K 4.1 5.1 5.0  CL 100 106 106  CO2 21 20 22   BUN 27* 38* 44*  CREATININE 1.53* 1.79* 2.14*  GLUCOSE 105* 103* 108*    Lab 01/06/12 0405 01/05/12 0425 01/04/12 0455  HGB 11.8* 12.0* 12.4*  HCT 35.1* 37.4* 38.0*  WBC 10.7* 12.0* 10.4  PLT 126* 136* 135*    Assessment and Plan:  Shock>>likely from sepsis and acute PE. Shock resolved 10/22 and off pressors.  .  NSTEMI with Hx of CAD, HTN. 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 P: Continue vancomycin, zosyn Stump dressing changes per VVS  Acute PE and Rt leg DVT P: Resume  heparin gtt per pharmacy Will eventually need to transition to oral anti-cougulation with removal of IVC filter  Acute renal insufficiency - Likely from hypotension, ACE, and NSAIDS. Returned to baseline, making urine, lytes stable. P: F/u renal fx, urine outpt, electrolytes.  Rt kidney lesion P: Renal lesion on Right kidney ?CA Will need further evaluation with MRI when more stable  BPH P: Continue avodart  Transfer to telemetry.  PT/OT evaluation.  D/c foley.  Will ask [facility] to assume care from 10/25 and PCCM sign off.   Coralyn Helling, MD [facility] Pulmonary/Critical Care 01/06/2012, 9:41 AM Pager:  [phone] After 3pm call: [phone]

## 2012-01-06 NOTE — Evaluation (Signed)
Occupational Therapy Evaluation Patient Details Name: Kevin [name] MRN: [id number] DOB: 1940/10/24 Today's Date: 01/06/2012 Time: 6644-0347 OT Time Calculation (min): 27 min  OT Assessment / Plan / Recommendation Clinical Impression  This 71 y.o. male admitted for Lt. AKA. Pt. reports he has been using wheelchair as primary  mode of locomotion PTA for past 2-4 months.  Pt. will benefit from OT to maximize safety and independence with BADLs to allow pt. to return home with family at modified independent level after CIR vs. SNF stay    OT Assessment  Patient needs continued OT Services    Follow Up Recommendations  Inpatient Rehab;Supervision/Assistance - 24 hour    Barriers to Discharge None    Equipment Recommendations  3 in 1 bedside comode    Recommendations for Other Services Rehab consult  Frequency  Min 2X/week    Precautions / Restrictions Precautions Precautions: Fall Restrictions Weight Bearing Restrictions: No       ADL  Eating/Feeding: Independent Where Assessed - Eating/Feeding: Chair Grooming: Set up Where Assessed - Grooming: Supported sitting Upper Body Bathing: Set up Where Assessed - Upper Body Bathing: Supported sitting Lower Body Bathing: +2 Total assistance Lower Body Bathing: Patient Percentage: 30% Where Assessed - Lower Body Bathing: Unsupported sit to stand Upper Body Dressing: Set up Where Assessed - Upper Body Dressing: Unsupported sitting Lower Body Dressing: +2 Total assistance Lower Body Dressing: Patient Percentage: 0% Where Assessed - Lower Body Dressing: Supported sit to stand Toilet Transfer: Simulated;+2 Total assistance Toilet Transfer: Patient Percentage: 10% Statistician Method: Sit to Barista: Bedside commode Equipment Used: Rolling walker;Gait belt Transfers/Ambulation Related to ADLs: sit to stand with total A + 2 (pt ~10%)    OT Diagnosis: Generalized weakness;Acute pain  OT Problem List:  Decreased strength;Decreased activity tolerance;Impaired balance (sitting and/or standing);Decreased knowledge of use of DME or AE;Obesity OT Treatment Interventions: Self-care/ADL training;Therapeutic exercise;DME and/or AE instruction;Therapeutic activities;Patient/family education;Balance training   OT Goals Acute Rehab OT Goals OT Goal Formulation: With patient Time For Goal Achievement: 01/20/12 Potential to Achieve Goals: Good ADL Goals Pt Will Perform Lower Body Bathing: with mod assist;Sit to stand from chair;Sit to stand from bed ADL Goal: Lower Body Bathing - Progress: Goal set today Pt Will Perform Lower Body Dressing: with mod assist;Sit to stand from chair;Sit to stand from bed ADL Goal: Lower Body Dressing - Progress: Goal set today Pt Will Transfer to Toilet: with mod assist;3-in-1;Stand pivot transfer ADL Goal: Toilet Transfer - Progress: Goal set today Pt Will Perform Toileting - Clothing Manipulation: with mod assist;Standing ADL Goal: Toileting - Clothing Manipulation - Progress: Goal set today Additional ADL Goal #1: Pt. will be independent with general UE strengthening HEP ADL Goal: Additional Goal #1 - Progress: Goal set today  Visit Information  Last OT Received On: 01/06/12 Assistance Needed: +2 PT/OT Co-Evaluation/Treatment: Yes    Subjective Data  Subjective: "someone is home with me almost all the time" Patient Stated Goal: To get stronger   Prior Functioning     Home Living Lives With: Family;Son (son and nephew) Available Help at Discharge: Family;Available 24 hours/day Type of Home: Apartment Home Access: Stairs to enter (plan for ramp) Entrance Stairs-Number of Steps: 4 Entrance Stairs-Rails: Can reach both;Left;Right Home Layout: One level Bathroom Shower/Tub: Health visitor: Handicapped height Bathroom Accessibility: Yes How Accessible: Accessible via wheelchair;Accessible via walker Home Adaptive Equipment: Grab bars  around toilet;Grab bars in shower;Hand-held shower hose;Shower chair with back;Walker - rolling;Wheelchair - manual Prior  Function Level of Independence: Needs assistance Needs Assistance: Meal Prep;Light Housekeeping;Transfers;Gait Meal Prep: Total Light Housekeeping: Total Gait Assistance: Used wheelchair as primary mode of locomotion Transfer Assistance: Transferred to/from wheelchair modified independently per pt report Able to Take Stairs?: Yes (with assist from nephew and son) Driving: No Vocation: Retired Musician: No difficulties         Vision/Perception     Cognition  Overall Cognitive Status: Appears within functional limits for tasks assessed/performed Arousal/Alertness: Awake/alert Orientation Level: Appears intact for tasks assessed Behavior During Session: [facility] for tasks performed    Extremity/Trunk Assessment Right Upper Extremity Assessment RUE ROM/Strength/Tone: Within functional levels RUE Sensation: WFL - Light Touch RUE Coordination: WFL - gross/fine motor Left Upper Extremity Assessment LUE ROM/Strength/Tone: Within functional levels LUE Sensation: WFL - Light Touch LUE Coordination: WFL - gross/fine motor Trunk Assessment Trunk Assessment: Normal     Mobility Bed Mobility Bed Mobility: Not assessed Transfers Transfers: Sit to Stand;Stand to Sit Sit to Stand: 1: +2 Total assist;From bed;With upper extremity assist Sit to Stand: Patient Percentage: 10% Stand to Sit: 1: +2 Total assist;To chair/3-in-1;With upper extremity assist Stand to Sit: Patient Percentage: 20% Details for Transfer Assistance: Pt. with significant difficulty moving sit to stand.  Pt maintains hips and knees flexed.  Requires physical assist and verbal cues to attempt to stand erect.  Only able to maintain for 5 seconds before he moves back into a flexed posture     Shoulder Instructions     Exercise General Exercises - Upper Extremity Chair Push Up:  Strengthening;10 reps;Seated   Balance Balance Balance Assessed: Yes Static Standing Balance Static Standing - Balance Support: Bilateral upper extremity supported Static Standing - Level of Assistance: 1: +2 Total assist Static Standing - Comment/# of Minutes: 4 mins - requires assist to extend hip and knee as pt. maintains knees and hips flexed   End of Session OT - End of Session Equipment Utilized During Treatment: Gait belt Activity Tolerance: Patient tolerated treatment well Patient left: in chair;with call bell/phone within reach Nurse Communication: Mobility status  GO     Soha Thorup M 01/06/2012, 2:30 PM

## 2012-01-06 NOTE — Progress Notes (Signed)
Rehab Admissions Coordinator Note:  Patient was screened by Clois Dupes for appropriateness for an Inpatient Acute Rehab Consult.  Patient was Mod I at wheelchair level pta. Has family support at home. Would benefit from a full assessment for inpt rehab.At this time, we are recommending Inpatient Rehab consult. I have contacted Avie Arenas, [facility] for order.  Clois Dupes, RN 01/06/2012, 3:27 PM  I can be reached at [phone].

## 2012-01-06 NOTE — Progress Notes (Signed)
Patient ID: Kevin [name], male   DOB: November 23, 1940, 71 y.o.   MRN: [id number] Comfortable this morning. Rested well last night. Remains hemodynamically stable. No respiratory distress. Left above-knee amputation dressing is intact. We'll check wound tomorrow.

## 2012-01-06 NOTE — Progress Notes (Signed)
ANTICOAGULATION CONSULT NOTE - Follow Up Consult  Pharmacy Consult for heparin Indication: B/L pulmonary embolus and R DVT  No Known Allergies  Patient Measurements: Height: 5\' 8"  (172.7 cm) Weight: 216 lb 4.8 oz (98.113 kg) IBW/kg (Calculated) : 68.4  Heparin Dosing Weight: 80 kg  Vital Signs: Temp: 97.8 F (36.6 C) (10/24 0800) Temp src: Oral (10/24 0800) BP: 99/64 mmHg (10/24 0900) Pulse Rate: 42  (10/24 0900)  Labs:  Basename 01/06/12 0405 01/05/12 0425 01/04/12 0455 01/03/12 2108  HGB 11.8* 12.0* -- --  HCT 35.1* 37.4* 38.0* --  PLT 126* 136* 135* --  APTT -- -- -- --  LABPROT -- -- -- --  INR -- -- -- --  HEPARINUNFRC -- 0.31 0.31 0.83*  CREATININE 1.53* 1.79* 2.14* --  CKTOTAL -- -- -- --  CKMB -- -- -- --  TROPONINI -- -- -- --    Estimated Creatinine Clearance: 50.3 ml/min (by C-G formula based on Cr of 1.53).   Assessment: 55 YOM who was admitted with an ischemic left leg who underwent an AKA yesterday. He also has a bilateral PE and right DVT. An IVC filter was placed 10/21 as well. Heparin was turned off before surgery yesterday with plans to restart this morning. CBC is low, but stable.  Goal of Therapy:  Heparin level 0.3-0.7 units/ml Monitor platelets by anticoagulation protocol: Yes   Plan:  Waiting on CCM for restart heparin plans. They have not yet seen the patient.  Justen Fonda D. Denina Rieger, PharmD Clinical Pharmacist Pager: [phone] Phone: [phone] 01/06/2012 11:56 AM

## 2012-01-06 NOTE — Progress Notes (Signed)
Report called to [employer].  Transported to 4714 in wheelchair with RN on monitor.  VSS.  No distress noted.

## 2012-01-06 NOTE — Progress Notes (Signed)
ANTICOAGULATION CONSULT NOTE - Follow Up Consult  Pharmacy Consult for Heparin Indication: pulmonary embolus B/L, R DVT - new  No Known Allergies  Patient Measurements: Height: 5\' 8"  (172.7 cm) Weight: 216 lb 4.8 oz (98.113 kg) IBW/kg (Calculated) : 68.4  Heparin Dosing Weight: 80 kg  Vital Signs: Temp: 97.8 F (36.6 C) (10/24 0800) Temp src: Oral (10/24 0800) BP: 99/64 mmHg (10/24 0900) Pulse Rate: 42  (10/24 0900)  Labs:  Basename 01/06/12 0405 01/05/12 0425 01/04/12 0455 01/03/12 2108  HGB 11.8* 12.0* -- --  HCT 35.1* 37.4* 38.0* --  PLT 126* 136* 135* --  APTT -- -- -- --  LABPROT -- -- -- --  INR -- -- -- --  HEPARINUNFRC -- 0.31 0.31 0.83*  CREATININE 1.53* 1.79* 2.14* --  CKTOTAL -- -- -- --  CKMB -- -- -- --  TROPONINI -- -- -- --   Estimated Creatinine Clearance: 50.3 ml/min (by C-G formula based on Cr of 1.53).  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 It has also been confirmed that he has a B/L PE and R DVT. Heparin was held prior to his procedure and has been restarted per CCM today. His H/H and platelets have been stable over the past few days, although they are all low. Prior to his surgery, he was on heparin at 1200units/hour with the last heparin level on 10/23 at 0425 of 0.31.  Goal of Therapy:  Heparin level 0.3-0.7 units/ml Monitor platelets by anticoagulation protocol: Yes   Plan:   1. Will restart heparin at 1200 units/hour as the last reading on this rate was within range and he has multiple clots plus recent surgery. 2. Heparin level in 8 hours given his age 19. Daily heparin level and CBC  Alaycia Eardley D. Deacon Gadbois, PharmD Clinical Pharmacist Pager: [phone] Phone: [phone] 01/06/2012 12:57 PM

## 2012-01-06 NOTE — Care Management Note (Signed)
    Page 1 of 1   01/06/2012     10:23:53 AM   CARE MANAGEMENT NOTE 01/06/2012  Patient:  Kevin [name], Kevin [name]   Account Number:  [id number]  Date Initiated:  01/04/2012  Documentation initiated by:  [facility]  Subjective/Objective Assessment:   Gangrenous foot - septic - plan for amputation.     Action/Plan:   Anticipated DC Date:  01/07/2012   Anticipated DC Plan:  HOME W HOME HEALTH SERVICES      DC Planning Services  CM consult      Choice offered to / List presented to:             Status of service:  In process, will continue to follow Medicare Important Message given?   (If response is "NO", the following Medicare IM given date fields will be blank) Date Medicare IM given:   Date Additional Medicare IM given:    Discharge Disposition:    Per UR Regulation:  Reviewed for med. necessity/level of care/duration of stay  If discussed at Long Length of Stay Meetings, dates discussed:    Comments:  01-06-12 10:20am Avie Arenas, RNBSN [id number] Patient had AKA on 10-23.  Will need PT/OT consult.  Has been home prior with family -  01-03-12 1pm Avie Arenas, RNBSN - [phone] Talked with patient and ex wife in room. patient states he lives with son and nephew.  Not opposed to rehab of some sort if needed prior to going home.  Plan for amputation surgery today.

## 2012-01-07 ENCOUNTER — Inpatient Hospital Stay (HOSPITAL_COMMUNITY): Payer: Medicare Other

## 2012-01-07 DIAGNOSIS — L98499 Non-pressure chronic ulcer of skin of other sites with unspecified severity: Secondary | ICD-10-CM

## 2012-01-07 DIAGNOSIS — S78119A Complete traumatic amputation at level between unspecified hip and knee, initial encounter: Secondary | ICD-10-CM

## 2012-01-07 DIAGNOSIS — I739 Peripheral vascular disease, unspecified: Secondary | ICD-10-CM

## 2012-01-07 LAB — CBC
MCV: 82.8 fL (ref 78.0–100.0)
Platelets: 149 10*3/uL — ABNORMAL LOW (ref 150–400)
RBC: 4.37 MIL/uL (ref 4.22–5.81)
WBC: 10.5 10*3/uL (ref 4.0–10.5)

## 2012-01-07 LAB — BASIC METABOLIC PANEL
CO2: 22 mEq/L (ref 19–32)
Chloride: 103 mEq/L (ref 96–112)
Potassium: 4.2 mEq/L (ref 3.5–5.1)
Sodium: 135 mEq/L (ref 135–145)

## 2012-01-07 LAB — MAGNESIUM: Magnesium: 1.9 mg/dL (ref 1.5–2.5)

## 2012-01-07 LAB — VANCOMYCIN, TROUGH: Vancomycin Tr: 15.4 ug/mL (ref 10.0–20.0)

## 2012-01-07 MED ORDER — WARFARIN - PHARMACIST DOSING INPATIENT
Freq: Every day | Status: DC
  Administered 2012-01-08 – 2012-01-09 (×2)

## 2012-01-07 MED ORDER — WARFARIN SODIUM 7.5 MG PO TABS
7.5000 mg | ORAL_TABLET | Freq: Once | ORAL | Status: AC
  Administered 2012-01-07: 7.5 mg via ORAL
  Filled 2012-01-07: qty 1

## 2012-01-07 MED ORDER — COUMADIN BOOK
Freq: Once | Status: AC
  Administered 2012-01-07: 15:00:00
  Filled 2012-01-07: qty 1

## 2012-01-07 MED ORDER — WARFARIN - PHYSICIAN DOSING INPATIENT: Freq: Every day | Status: DC

## 2012-01-07 MED ORDER — CARVEDILOL 3.125 MG PO TABS
3.1250 mg | ORAL_TABLET | Freq: Two times a day (BID) | ORAL | Status: DC
  Administered 2012-01-07 – 2012-01-10 (×6): 3.125 mg via ORAL
  Filled 2012-01-07 (×8): qty 1

## 2012-01-07 MED ORDER — WARFARIN VIDEO
Freq: Once | Status: AC
  Administered 2012-01-08: 12:00:00

## 2012-01-07 MED ORDER — SODIUM CHLORIDE 0.9 % IV SOLN
INTRAVENOUS | Status: DC
  Administered 2012-01-07 – 2012-01-09 (×3): via INTRAVENOUS

## 2012-01-07 NOTE — Progress Notes (Addendum)
[facility] [place]  Postoperative Visit - Amputation  Date of Surgery: 12/31/2011 - 01/05/2012 Procedure(s): AMPUTATION ABOVE KNEE Left Surgeon: Surgeon(s): Larina Earthly, MD POD: 2 Days Post-Op  Subjective Kevin [name] is a 71 y.o. male who is S/P Left Procedure(s): AMPUTATION ABOVE KNEE.  Pt.denies increased pain in the stump. The patient notes pain is well controlled. Pt. reports phantom pain.  Significant Diagnostic Studies: CBC Lab Results  Component Value Date   WBC 10.5 01/07/2012   HGB 12.1* 01/07/2012   HCT 36.2* 01/07/2012   MCV 82.8 01/07/2012   PLT 149* 01/07/2012    BMET    Component Value Date/Time   NA 135 01/07/2012 0650   K 4.2 01/07/2012 0650   CL 103 01/07/2012 0650   CO2 22 01/07/2012 0650   GLUCOSE 122* 01/07/2012 0650   BUN 26* 01/07/2012 0650   CREATININE 1.59* 01/07/2012 0650   CALCIUM 8.5 01/07/2012 0650   GFRNONAA 42* 01/07/2012 0650   GFRAA 49* 01/07/2012 0650    COAG Lab Results  Component Value Date   INR 1.37 01/03/2012   INR 1.14 12/31/2011   No results found for this basename: PTT     Intake/Output Summary (Last 24 hours) at 01/07/12 0750 Last data filed at 01/07/12 0612  Gross per 24 hour  Intake    779 ml  Output    751 ml  Net     28 ml   No data found.    Physical Examination  BP Readings from Last 3 Encounters:  01/07/12 126/94  01/07/12 126/94  01/07/12 126/94   Temp Readings from Last 3 Encounters:  01/07/12 98.7 F (37.1 C) Oral  01/07/12 98.7 F (37.1 C) Oral  01/07/12 98.7 F (37.1 C) Oral   SpO2 Readings from Last 3 Encounters:  01/07/12 99%  01/07/12 99%  01/07/12 99%   Pulse Readings from Last 3 Encounters:  01/07/12 101  01/07/12 101  01/07/12 101    Pt is A&Ox3  WDWN male with no complaints  Right amputation wound is healing well.  There is good bone coverage in the stump Stump is warm and well perfused, without drainage; with some erythema on the  posterior flap   Assessment/plan:  Kevin [name] is a 71 y.o. male who is s/p Right Procedure(s): AMPUTATION ABOVE KNEE  The patient's stump is viable  Will contact [employer] for retention sock  Rehab to see.  Follow-up 4 weeks from surgery  Kevin [name] 7:50 AM 01/07/2012 [id number]  I have examined the patient, reviewed and agree with above. Patient up in chair. Somnolent. Left healing with some drainage from the skin incision. We will see again Monday. Please call if we can assist over the weekend.  EARLY, TODD, MD 01/07/2012 12:25 PM

## 2012-01-07 NOTE — Progress Notes (Signed)
Physical Therapy Treatment Patient Details Name: Kevin [name] MRN: [id number] DOB: 1940/10/05 Today's Date: 01/07/2012 Time: 8295-6213 PT Time Calculation (min): 39 min  PT Assessment / Plan / Recommendation Comments on Treatment Session  Pt cont's to require +2 total (A) for OOB mobility at this date.   He has difficulty using UE's on RW to offload weight from Rt LE to "hop" at this time.  Cont' to recommend CIR to maximize functional recovery before d/c home.      Follow Up Recommendations  Post acute inpatient     Does the patient have the potential to tolerate intense rehabilitation  Yes, Recommend IP Rehab Screening  Barriers to Discharge        Equipment Recommendations  3 in 1 bedside comode    Recommendations for Other Services Rehab consult  Frequency Min 3X/week   Plan Discharge plan remains appropriate    Precautions / Restrictions Precautions Precautions: Fall Restrictions Weight Bearing Restrictions: No       Mobility  Bed Mobility Bed Mobility: Supine to Sit;Sitting - Scoot to Edge of Bed Supine to Sit: 3: Mod assist;HOB elevated;With rails Sitting - Scoot to Edge of Bed: 3: Mod assist Details for Bed Mobility Assistance: Cues for sequencing & technique.  (A) to lift shoulders/trunk to sitting upright & use of draw pad to pivot hips around & closer to EOB.   Transfers Transfers: Sit to Stand;Stand to Sit;Stand Pivot Transfers Sit to Stand: 1: +2 Total assist;With upper extremity assist;From bed;From chair/3-in-1;With armrests Sit to Stand: Patient Percentage: 30% Stand to Sit: 1: +2 Total assist;With upper extremity assist;With armrests;To chair/3-in-1 Stand to Sit: Patient Percentage: 20% Stand Pivot Transfers: 1: +2 Total assist Stand Pivot Transfers: Patient Percentage: 30% Details for Transfer Assistance: Max directional cues for sequencing during pivot from bed>3-in-1.  Pt with difficulty pivoting Rt foot during SPT & required total (A) to pivot  hips.  For 3-in-1>recliner, had pt stand & then recliner was positioned directly behind him.   Ambulation/Gait Ambulation/Gait Assistance: Not tested (comment)      PT Goals Acute Rehab PT Goals Time For Goal Achievement: 01/20/12 Potential to Achieve Goals: Good Pt will go Supine/Side to Sit: with modified independence PT Goal: Supine/Side to Sit - Progress: Progressing toward goal Pt will go Sit to Supine/Side: with modified independence Pt will go Sit to Stand: with supervision PT Goal: Sit to Stand - Progress: Progressing toward goal Pt will go Stand to Sit: with supervision PT Goal: Stand to Sit - Progress: Progressing toward goal Pt will Transfer Bed to Chair/Chair to Bed: with supervision PT Transfer Goal: Bed to Chair/Chair to Bed - Progress: Progressing toward goal Pt will Ambulate: 16 - 50 feet;with min assist;with rolling walker  Visit Information  Last PT Received On: 01/07/12 Assistance Needed: +2    Subjective Data      Cognition  Overall Cognitive Status: Appears within functional limits for tasks assessed/performed Arousal/Alertness: Awake/alert Orientation Level: Appears intact for tasks assessed Behavior During Session: [facility] for tasks performed    Balance     End of Session PT - End of Session Equipment Utilized During Treatment: Gait belt Activity Tolerance: Patient tolerated treatment well Patient left: in chair;with call bell/phone within reach Nurse Communication: Mobility status;Need for lift equipment     Verdell Face, [place] [phone] 01/07/2012

## 2012-01-07 NOTE — Progress Notes (Signed)
ANTIBIOTIC CONSULT NOTE - FOLLOW UP  Pharmacy Consult for Vancomycin Indication: Ischemic L leg  No Known Allergies  Patient Measurements: Height: 5\' 8"  (172.7 cm) Weight: 213 lb 10 oz (96.9 kg) (bed scale pt is new AKA left leg) IBW/kg (Calculated) : 68.4   Vital Signs: Temp: 98.1 F (36.7 C) (10/25 1504) Temp src: Oral (10/25 1504) BP: 132/66 mmHg (10/25 1504) Pulse Rate: 81  (10/25 1504) Intake/Output from previous day: 10/24 0701 - 10/25 0700 In: 779 [I.V.:274; IV Piggyback:200] Out: 751 [Urine:750; Stool:1] Intake/Output from this shift: Total I/O In: 360 [P.O.:360] Out: 601 [Urine:600; Stool:1]  Labs:  [facility] 01/07/12 0650 01/06/12 0405 01/05/12 0425  WBC 10.5 10.7* 12.0*  HGB 12.1* 11.8* 12.0*  PLT 149* 126* 136*  LABCREA -- -- --  CREATININE 1.59* 1.53* 1.79*   Estimated Creatinine Clearance: 48.1 ml/min (by C-G formula based on Cr of 1.59).  Basename 01/07/12 1503  VANCOTROUGH 15.4  VANCOPEAK --  Drue Dun --  GENTTROUGH --  GENTPEAK --  GENTRANDOM --  TOBRATROUGH --  TOBRAPEAK --  TOBRARND --  AMIKACINPEAK --  AMIKACINTROU --  AMIKACIN --     Microbiology: Recent Results (from the past 720 hour(s))  CULTURE, BLOOD (ROUTINE X 2)     Status: Normal (Preliminary result)   Collection Time   01/02/12 10:20 AM      Component Value Range Status Comment   Specimen Description BLOOD RIGHT HAND   Final    Special Requests BOTTLES DRAWN AEROBIC ONLY 3CC   Final    Culture  Setup Time 01/02/2012 15:52   Final    Culture     Final    Value:        BLOOD CULTURE RECEIVED NO GROWTH TO DATE CULTURE WILL BE HELD FOR 5 DAYS BEFORE ISSUING A FINAL NEGATIVE REPORT   Report Status PENDING   Incomplete   MRSA PCR SCREENING     Status: Normal   Collection Time   01/02/12 10:22 AM      Component Value Range Status Comment   MRSA by PCR NEGATIVE  NEGATIVE Final   CULTURE, BLOOD (ROUTINE X 2)     Status: Normal (Preliminary result)   Collection Time   01/02/12 10:30 AM      Component Value Range Status Comment   Specimen Description BLOOD RIGHT HAND   Final    Special Requests     Final    Value: BOTTLES DRAWN AEROBIC AND ANAEROBIC 5CC AER 3CC ANA   Culture  Setup Time 01/02/2012 15:52   Final    Culture     Final    Value:        BLOOD CULTURE RECEIVED NO GROWTH TO DATE CULTURE WILL BE HELD FOR 5 DAYS BEFORE ISSUING A FINAL NEGATIVE REPORT   Report Status PENDING   Incomplete   URINE CULTURE     Status: Normal   Collection Time   01/02/12 12:00 PM      Component Value Range Status Comment   Specimen Description URINE, CATHETERIZED   Final    Special Requests NONE   Final    Culture  Setup Time 01/02/2012 17:12   Final    Colony Count NO GROWTH   Final    Culture NO GROWTH   Final    Report Status 01/03/2012 FINAL   Final   CULTURE, EXPECTORATED SPUTUM-ASSESSMENT     Status: Normal   Collection Time   01/02/12  2:15 PM  Component Value Range Status Comment   Specimen Description SPUTUM   Final    Special Requests NONE   Final    Sputum evaluation     Final    Value: THIS SPECIMEN IS ACCEPTABLE. RESPIRATORY CULTURE REPORT TO FOLLOW.   Report Status 01/02/2012 FINAL   Final   CULTURE, RESPIRATORY     Status: Normal   Collection Time   01/02/12  2:15 PM      Component Value Range Status Comment   Specimen Description SPUTUM   Final    Special Requests NONE   Final    Gram Stain     Final    Value: FEW WBC PRESENT, PREDOMINANTLY PMN     FEW SQUAMOUS EPITHELIAL CELLS PRESENT     NO ORGANISMS SEEN   Culture NORMAL OROPHARYNGEAL FLORA   Final    Report Status 01/04/2012 FINAL   Final     Anti-infectives     Start     Dose/Rate Route Frequency Ordered Stop   01/04/12 1400   vancomycin (VANCOCIN) IVPB 1000 mg/200 mL premix        1,000 mg 200 mL/hr over 60 Minutes Intravenous Every 24 hours 01/03/12 1349     01/03/12 2200  piperacillin-tazobactam (ZOSYN) IVPB 3.375 g       3.375 g 12.5 mL/hr over 240 Minutes  Intravenous 3 times per day 01/03/12 1422     01/03/12 1400   vancomycin (VANCOCIN) IVPB 1000 mg/200 mL premix        1,000 mg 200 mL/hr over 60 Minutes Intravenous NOW 01/03/12 1349 01/03/12 1700   01/02/12 1800   piperacillin-tazobactam (ZOSYN) IVPB 2.25 g  Status:  Discontinued        2.25 g 100 mL/hr over 30 Minutes Intravenous 4 times per day 01/02/12 1500 01/03/12 1422   01/02/12 1400   vancomycin (VANCOCIN) 750 mg in sodium chloride 0.9 % 150 mL IVPB  Status:  Discontinued        750 mg 150 mL/hr over 60 Minutes Intravenous Every 24 hours 01/02/12 0850 01/02/12 1310   01/02/12 1130   vancomycin (VANCOCIN) IVPB 1000 mg/200 mL premix  Status:  Discontinued        1,000 mg 200 mL/hr over 60 Minutes Intravenous  Once 01/02/12 1117 01/02/12 1223   01/01/12 1200   vancomycin (VANCOCIN) IVPB 1000 mg/200 mL premix  Status:  Discontinued        1,000 mg 200 mL/hr over 60 Minutes Intravenous Every 24 hours 01/01/12 0158 01/02/12 0849   01/01/12 0230   vancomycin (VANCOCIN) 1,500 mg in sodium chloride 0.9 % 500 mL IVPB        1,500 mg 250 mL/hr over 120 Minutes Intravenous  Once 01/01/12 0158 01/01/12 0436   01/01/12 0215   piperacillin-tazobactam (ZOSYN) IVPB 3.375 g  Status:  Discontinued        3.375 g 12.5 mL/hr over 240 Minutes Intravenous Every 8 hours 01/01/12 0201 01/02/12 1500   01/01/12 0200   piperacillin-tazobactam (ZOSYN) IVPB 3.375 g  Status:  Discontinued        3.375 g 100 mL/hr over 30 Minutes Intravenous Every 8 hours 01/01/12 0149 01/01/12 0201          Assessment: 71 y.o. M on Vancomycin per Rx + Zosyn per MD for coverage of a L ischemic leg and r/o sepsis with a therapeutic Vancomycin trough this afternoon (Vanc level~15.4). Renal function is noted to have bumped this admission and has since trended  down to what appears to be the patient's baseline. Cultures are negative or no growth to date thus far.   Goal of Therapy:  Vancomycin trough level 15-20  mcg/ml  Plan:  1. Continue Vancomycin 1g IV every 24 hours 2. Will continue to follow renal function, culture results, LOT, and antibiotic de-escalation plans   Georgina Pillion, PharmD, BCPS Clinical Pharmacist Pager: [phone] 01/07/2012 6:19 PM
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
## 2012-01-07 NOTE — Progress Notes (Signed)
ANTICOAGULATION CONSULT NOTE - Follow Up Consult  Pharmacy Consult for Lovenox and Coumadin Indication: pulmonary embolus B/L, R DVT - new  No Known Allergies  Patient Measurements: Height: 5\' 8"  (172.7 cm) Weight: 213 lb 10 oz (96.9 kg) (bed scale pt is new AKA left leg) IBW/kg (Calculated) : 68.4   Vital Signs: Temp: 98.1 F (36.7 C) (10/25 1504) Temp src: Oral (10/25 1504) BP: 132/66 mmHg (10/25 1504) Pulse Rate: 81  (10/25 1504)  Labs:  Basename 01/07/12 0650 01/06/12 0405 01/05/12 0425  HGB 12.1* 11.8* --  HCT 36.2* 35.1* 37.4*  PLT 149* 126* 136*  APTT -- -- --  LABPROT -- -- --  INR -- -- --  HEPARINUNFRC -- -- 0.31  CREATININE 1.59* 1.53* 1.79*  CKTOTAL -- -- --  CKMB -- -- --  TROPONINI -- -- --   Estimated Creatinine Clearance: 48.1 ml/min (by C-G formula based on Cr of 1.59).  Assessment: 71 y.o. male on SQ lovenox/coumadin (Day #1/5 minimum overlap) for large B/L PE and R DVT; IVC filter placed 10/21. Baseline INR 1.37 10/21. No bleeding noted. Start of coumadin okayed by vascular surgery.  Goal of Therapy:  Monitor platelets by anticoagulation protocol: Yes anti-Xa level = 0.6-1.2 units/ml (4 hrs post dose) INR 2-3   Plan:  Continue Lovenox 100 mg SQ q12h Monitor CBC q72h while on lovenox Daily PT/INR Coumadin 7.5mg  po today Coumadin educational book and video  Christoper Fabian, PharmD, BCPS Clinical pharmacist, pager [phone] 01/07/2012 3:45 PM

## 2012-01-07 NOTE — Consult Note (Signed)
Physical Medicine and Rehabilitation Consult Reason for Consult:  L-AKA Referring Physician:  Dr. Craige Cotta   HPI: Kevin [name] is a 71 y.o. male with history of CAD, LLE ulcers, admitted on 01/01/12 with pain, cellulitis and ischemia. Started on IV antibiotics and amputation recommended by Dr. Darrick Penna.  Patient with leucocytosis and renal failure. Patient developed hypotension due to septic shock on 10/20.  Dr Eliott Nine consulted and felt AKI due to multifactorial and fluid resuscitation with current care.  2D echo revealed severely dilated RV with EF 50-55%. Cardiology recommended and expressed concerns of PE and V/Q scan recommended.  V/Q scan done  revealing defects in RLL and L lobe.  LE doppler positive for DVT throughout RLE. IVC filter placed 10/21 and patient underwent L-AKA 10/23 by Dr. Arbie Cookey. Renal failure resolved. Therapies initiated yesterday and therapy team recommending CIR. Retention sock ordered by [employer].  Pain is well-controlled, phantom sensation but not phantom pain Review of Systems  Eyes: Positive for blurred vision (left cataract. ).  Respiratory: Negative for shortness of breath.   Cardiovascular: Negative for chest pain and palpitations.  Gastrointestinal: Positive for constipation. Negative for nausea and vomiting.  Musculoskeletal: Positive for back pain and falls (recent due to  BLE instability).  Neurological: Positive for weakness. Negative for dizziness and headaches.   Past Medical History  Diagnosis Date  . Arthritis   . Hypertension   . CHF (congestive heart failure)     a. NICM/acute CHF 2007 with EF 10-20%. b. Improved EF 50-55% by echo 12/2011.  Marland Kitchen Hyperlipidemia   . Coronary artery disease     a. NSTEMI 2007 with cath showing mild nonobstructive CAD by cath 2007 (40% mLAD, 40%mRCA) at time of dx of NICM.  Marland Kitchen Tortuous aorta     a. Noted by cath 2007, recommendation for OP [place].  Marland Kitchen BPH (benign prostatic hyperplasia)   . Wide-complex tachycardia     a. Noted  2007.  . Obesity   . Valvular heart disease     a. Mod MR by echo 06/2005 but no significant MR 2013.   Past Surgical History  Procedure Date  . No past surgeries   . Vena cava filter placement 01/03/2012    Procedure: INSERTION VENA-CAVA FILTER;  Surgeon: Larina Earthly, MD;  Location: [facility] OR;  Service: Vascular;  Laterality: Left;  . Amputation 01/05/2012    Procedure: AMPUTATION ABOVE KNEE;  Surgeon: Larina Earthly, MD;  Location[facility];  Service: Vascular;  Laterality: Left;   Family History  Problem Relation Age of Onset  . Heart attack Mother     In her 23's  . Stroke Mother     In her 12's  . CAD Brother    Social History:  Lives with family. Impaired mobility for past 10 months and nonambulatory for past month due to LLE ulcers/weakness. He reports that he has quit smoking 2 weeks ago-used to smoke 1 PPD.  He uses smokeless tobacco. He reports that he does not drink alcohol or use illicit drugs. Nephew at home and son checks in during the day.   Allergies: No Known Allergies  Medications Prior to Admission  Medication Sig Dispense Refill  . aspirin 325 MG tablet Take 325 mg by mouth daily.      Marland Kitchen atorvastatin (LIPITOR) 20 MG tablet Take 20 mg by mouth daily.      . carvedilol (COREG) 12.5 MG tablet Take 12.5 mg by mouth 2 (two) times daily with a meal.      .  cyclobenzaprine (FLEXERIL) 10 MG tablet Take 10 mg by mouth 3 (three) times daily as needed. For muscle spasms      . diazepam (VALIUM) 2 MG tablet Take 2 mg by mouth 3 (three) times daily.      Marland Kitchen docusate sodium (COLACE) 100 MG capsule Take 100 mg by mouth daily.      Marland Kitchen dutasteride (AVODART) 0.5 MG capsule Take 0.5 mg by mouth every evening.      . furosemide (LASIX) 40 MG tablet Take 40 mg by mouth at bedtime.      Marland Kitchen ibuprofen (ADVIL,MOTRIN) 200 MG tablet Take 200 mg by mouth every 6 (six) hours as needed. For pain      . lisinopril (PRINIVIL,ZESTRIL) 20 MG tablet Take 20 mg by mouth daily.      Marland Kitchen oxyCODONE-acetaminophen  (PERCOCET) 10-325 MG per tablet Take 1 tablet by mouth 2 (two) times daily as needed. For pain      . sulindac (CLINORIL) 200 MG tablet Take 200 mg by mouth 2 (two) times daily.        Home: Home Living Lives With: Family;Son (son and nephew) Available Help at Discharge: Family;Available 24 hours/day Type of Home: Apartment Home Access: Stairs to enter (plan for ramp) Entrance Stairs-Number of Steps: 4 Entrance Stairs-Rails: Can reach both;Left;Right Home Layout: One level Bathroom Shower/Tub: Health visitor: Handicapped height Bathroom Accessibility: Yes How Accessible: Accessible via wheelchair;Accessible via walker Home Adaptive Equipment: Grab bars around toilet;Grab bars in shower;Hand-held shower hose;Shower chair with back;Walker - rolling;Wheelchair - manual  Functional History: Prior Function Meal Prep: Total Light Housekeeping: Total Able to Take Stairs?: Yes (with assist from nephew and son) Driving: No Vocation: Retired Functional Status:  Mobility: Bed Mobility Bed Mobility: Not assessed Transfers Transfers: Sit to Stand;Stand to Sit Sit to Stand: 1: +2 Total assist;From bed;With upper extremity assist Sit to Stand: Patient Percentage: 10% Stand to Sit: 1: +2 Total assist;To chair/3-in-1;With upper extremity assist Stand to Sit: Patient Percentage: 20% Ambulation/Gait Ambulation/Gait Assistance: Not tested (comment) Stairs: No Wheelchair Mobility Wheelchair Mobility: No  ADL: ADL Eating/Feeding: Independent Where Assessed - Eating/Feeding: Chair Grooming: Set up Where Assessed - Grooming: Supported sitting Upper Body Bathing: Set up Where Assessed - Upper Body Bathing: Supported sitting Lower Body Bathing: +2 Total assistance Where Assessed - Lower Body Bathing: Unsupported sit to stand Upper Body Dressing: Set up Where Assessed - Upper Body Dressing: Unsupported sitting Lower Body Dressing: +2 Total assistance Where Assessed - Lower  Body Dressing: Supported sit to stand Toilet Transfer: Simulated;+2 Total assistance Toilet Transfer Method: Sit to Barista: Bedside commode Equipment Used: Rolling walker;Gait belt Transfers/Ambulation Related to ADLs: sit to stand with total A + 2 (pt ~10%)  Cognition: Cognition Arousal/Alertness: Awake/alert Orientation Level: Oriented X4 Cognition Overall Cognitive Status: Appears within functional limits for tasks assessed/performed Arousal/Alertness: Awake/alert Orientation Level: Appears intact for tasks assessed Behavior During Session: [facility] for tasks performed  Blood pressure 140/72, pulse 92, temperature 98.7 F (37.1 C), temperature source Oral, resp. rate 20, height 5\' 8"  (1.727 m), weight 96.9 kg (213 lb 10 oz), SpO2 99.00%. Physical Exam  Nursing note and vitals reviewed. Constitutional: He is oriented to person, place, and time. He appears well-developed and well-nourished.  HENT:  Head: Normocephalic and atraumatic.  Eyes: Pupils are equal, round, and reactive to light.  Neck: Normal range of motion. Neck supple.  Cardiovascular: Normal rate and regular rhythm.   Pulmonary/Chest: Effort normal and breath sounds normal.  Abdominal: Soft. Bowel sounds are normal. He exhibits no distension. There is no tenderness.  Musculoskeletal: He exhibits edema (1+ RLE.).       Keeps RLE externally rotated.   Neurological: He is alert and oriented to person, place, and time.  Skin:       Stasis changes RLE with dry scab.  L-AKA with staples in place--clean, dry, intact.   Motor: 5/5 in bilateral deltoid, biceps, triceps, grip 4/5 in the right hip flexor knee extensor 3 minus/5 in the ankle dorsiflexor and plantar flexor 3 minus/5 in the left hip flexor Decreased range of motion right ankle Results for orders placed during the hospital encounter of 12/31/11 (from the past 24 hour(s))  CBC     Status: Abnormal   Collection Time   01/07/12  6:50 AM       Component Value Range   WBC 10.5  4.0 - 10.5 K/uL   RBC 4.37  4.22 - 5.81 MIL/uL   Hemoglobin 12.1 (*) 13.0 - 17.0 g/dL   HCT 47.8 (*) 29.5 - 62.1 %   MCV 82.8  78.0 - 100.0 fL   MCH 27.7  26.0 - 34.0 pg   MCHC 33.4  30.0 - 36.0 g/dL   RDW 30.8 (*) 65.7 - 84.6 %   Platelets 149 (*) 150 - 400 K/uL  BASIC METABOLIC PANEL     Status: Abnormal   Collection Time   01/07/12  6:50 AM      Component Value Range   Sodium 135  135 - 145 mEq/L   Potassium 4.2  3.5 - 5.1 mEq/L   Chloride 103  96 - 112 mEq/L   CO2 22  19 - 32 mEq/L   Glucose, Bld 122 (*) 70 - 99 mg/dL   BUN 26 (*) 6 - 23 mg/dL   Creatinine, Ser 9.62 (*) 0.50 - 1.35 mg/dL   Calcium 8.5  8.4 - 95.2 mg/dL   GFR calc non Af Amer 42 (*) >90 mL/min   GFR calc Af Amer 49 (*) >90 mL/min  MAGNESIUM     Status: Normal   Collection Time   01/07/12  6:50 AM      Component Value Range   Magnesium 1.9  1.5 - 2.5 mg/dL   No results found.  Assessment/Plan: Diagnosis: Left above-knee amputation due to peripheral vascular disease and cellulitis 1. Does the need for close, 24 hr/day medical supervision in concert with the patient's rehab needs make it unreasonable for this patient to be served in a less intensive setting? Yes 2. Co-Morbidities requiring supervision/potential complications: Coronary artery disease, diastolic congestive heart failure, axillary vein DVT and acute pulmonary embolism 3. Due to bladder management, bowel management, safety, skin/wound care, disease management, medication administration, pain management and patient education, does the patient require 24 hr/day rehab nursing? Yes 4. Does the patient require coordinated care of a physician, rehab nurse, PT (1-2 hrs/day, 5 days/week) and OT (1-2 hrs/day, 5 days/week) to address physical and functional deficits in the context of the above medical diagnosis(es)? Yes Addressing deficits in the following areas: balance, endurance, locomotion, strength, transferring,  bowel/bladder control, bathing, dressing, feeding, grooming and toileting 5. Can the patient actively participate in an intensive therapy program of at least 3 hrs of therapy per day at least 5 days per week? Potentially 6. The potential for patient to make measurable gains while on inpatient rehab is good 7. Anticipated functional outcomes upon discharge from inpatient rehab are Min to supervision assistance ability with  PT, Min supervision ADLs with OT, Not applicable with SLP. 8. Estimated rehab length of stay to reach the above functional goals is: 710 days 9. Does the patient have adequate social supports to accommodate these discharge functional goals? Potentially 10. Anticipated D/C setting: Home 11. Anticipated post D/C treatments: HH therapy 12. Overall Rehab/Functional Prognosis: good  RECOMMENDATIONS: This patient's condition is appropriate for continued rehabilitative care in the following setting: CIR Patient has agreed to participate in recommended program. Potentially Note that insurance prior authorization may be required for reimbursement for recommended care.  Comment:Currently cannot tolerate inpatient rehabilitation but should be ready in the next couple days. Need to identify caregiver for post hospital care.    01/07/2012

## 2012-01-07 NOTE — Progress Notes (Signed)
[facility] Tech Progress Note Patient Details:  Kevin [name] March 08, 1941 [id number]  Patient ID: [name], male   DOB: 1940-08-09, 71 y.o.   MRN: [id number]   [name] 01/07/2012, 8:00 AM Called [employer] for left retention sock for left aka
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
## 2012-01-07 NOTE — Progress Notes (Signed)
Pt had a 6 beat run of V.tach.  Pt asymptomatic, HR is now 92, BP 140/72.  Dr. Isidoro Donning notified.  Will continue to monitor.

## 2012-01-08 LAB — CULTURE, BLOOD (ROUTINE X 2)
Culture: NO GROWTH
Culture: NO GROWTH

## 2012-01-08 LAB — BASIC METABOLIC PANEL
BUN: 27 mg/dL — ABNORMAL HIGH (ref 6–23)
Calcium: 8.6 mg/dL (ref 8.4–10.5)
Creatinine, Ser: 1.57 mg/dL — ABNORMAL HIGH (ref 0.50–1.35)
GFR calc Af Amer: 49 mL/min — ABNORMAL LOW (ref >90)
GFR calc non Af Amer: 43 mL/min — ABNORMAL LOW (ref >90)
Glucose, Bld: 105 mg/dL — ABNORMAL HIGH (ref 70–99)

## 2012-01-08 MED ORDER — WARFARIN SODIUM 10 MG PO TABS
10.0000 mg | ORAL_TABLET | Freq: Once | ORAL | Status: AC
  Administered 2012-01-08: 10 mg via ORAL
  Filled 2012-01-08: qty 1
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
## 2012-01-08 NOTE — Progress Notes (Signed)
ANTICOAGULATION CONSULT NOTE - Follow Up Consult  Pharmacy Consult for Lovenox and Coumadin Indication: pulmonary embolus B/L, R DVT - new  No Known Allergies  Labs:  Basename 01/08/12 0716 01/07/12 0650 01/06/12 0405  HGB -- 12.1* 11.8*  HCT -- 36.2* 35.1*  PLT -- 149* 126*  APTT -- -- --  LABPROT 15.3* -- --  INR 1.23 -- --  HEPARINUNFRC -- -- --  CREATININE 1.57* 1.59* 1.53*  CKTOTAL -- -- --  CKMB -- -- --  TROPONINI -- -- --   Estimated Creatinine Clearance: 48.5 ml/min (by C-G formula based on Cr of 1.57).  Assessment: 71 y.o. male on SQ lovenox/coumadin (Day #2/5 minimum overlap) for large B/L PE and R DVT; IVC filter placed 10/21. Baseline INR 1.37 10/21. No bleeding noted.   Goal of Therapy:  Monitor platelets by anticoagulation protocol: Yes anti-Xa level = 0.6-1.2 units/ml (4 hrs post dose) INR 2-3   Plan:  Continue Lovenox 100 mg SQ q12h Coumadin 10 mg po today  Okey Regal, PharmD [phone]  01/08/2012 10:42 AM

## 2012-01-08 NOTE — Progress Notes (Signed)
Orthopedic Tech Progress Note Patient Details:  [name] 07/16/1940 [id number]  Patient ID: Kevin [name], male   DOB: 04/27/1940, 71 y.o.   MRN: [id number] Called [employer] with brace order;spoke with Tammy Sours @[phone]   Nikki Dom 01/08/2012, 5:20 PM

## 2012-01-09 DIAGNOSIS — I509 Heart failure, unspecified: Secondary | ICD-10-CM

## 2012-01-09 LAB — BASIC METABOLIC PANEL
BUN: 25 mg/dL — ABNORMAL HIGH (ref 6–23)
CO2: 24 mEq/L (ref 19–32)
Calcium: 8.4 mg/dL (ref 8.4–10.5)
Creatinine, Ser: 1.6 mg/dL — ABNORMAL HIGH (ref 0.50–1.35)
GFR calc non Af Amer: 42 mL/min — ABNORMAL LOW (ref >90)
Glucose, Bld: 105 mg/dL — ABNORMAL HIGH (ref 70–99)

## 2012-01-09 LAB — PROTIME-INR: INR: 1.54 — ABNORMAL HIGH (ref 0.00–1.49)

## 2012-01-09 MED ORDER — WARFARIN SODIUM 10 MG PO TABS
10.0000 mg | ORAL_TABLET | Freq: Once | ORAL | Status: AC
  Administered 2012-01-09: 10 mg via ORAL
  Filled 2012-01-09: qty 1

## 2012-01-09 NOTE — Progress Notes (Signed)
ANTICOAGULATION CONSULT NOTE - Follow Up Consult  Pharmacy Consult for Lovenox and Coumadin Indication: pulmonary embolus B/L, R DVT - new  No Known Allergies  Labs:  Basename 01/09/12 0834 01/08/12 0716 01/07/12 0650  HGB -- -- 12.1*  HCT -- -- 36.2*  PLT -- -- 149*  APTT -- -- --  LABPROT 18.0* 15.3* --  INR 1.54* 1.23 --  HEPARINUNFRC -- -- --  CREATININE 1.60* 1.57* 1.59*  CKTOTAL -- -- --  CKMB -- -- --  TROPONINI -- -- --   Estimated Creatinine Clearance: 47.7 ml/min (by C-G formula based on Cr of 1.6).  Assessment: 71 y.o. male on SQ lovenox/coumadin (Day #3/5 minimum overlap) for large B/L PE and R DVT; IVC filter placed 10/21. Baseline INR 1.37 10/21. No bleeding noted.   Goal of Therapy:  Monitor platelets by anticoagulation protocol: Yes anti-Xa level = 0.6-1.2 units/ml (4 hrs post dose) INR 2-3   Plan:  Continue Lovenox 100 mg SQ q12h Coumadin 10 mg po today  Okey Regal, PharmD [phone]  01/09/2012 11:15 AM
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
## 2012-01-10 ENCOUNTER — Inpatient Hospital Stay (HOSPITAL_COMMUNITY)
Admission: RE | Admit: 2012-01-10 | Discharge: 2012-01-24 | DRG: 945 | Disposition: A | Discharge status: Home Health | Payer: Medicare Other | Source: Ambulatory Visit | Attending: Physical Medicine & Rehabilitation | Admitting: Physical Medicine & Rehabilitation

## 2012-01-10 DIAGNOSIS — E669 Obesity, unspecified: Secondary | ICD-10-CM

## 2012-01-10 DIAGNOSIS — I129 Hypertensive chronic kidney disease with stage 1 through stage 4 chronic kidney disease, or unspecified chronic kidney disease: Secondary | ICD-10-CM | POA: Diagnosis not present

## 2012-01-10 DIAGNOSIS — I509 Heart failure, unspecified: Secondary | ICD-10-CM

## 2012-01-10 DIAGNOSIS — Z5189 Encounter for other specified aftercare: Principal | ICD-10-CM

## 2012-01-10 DIAGNOSIS — S78119A Complete traumatic amputation at level between unspecified hip and knee, initial encounter: Secondary | ICD-10-CM

## 2012-01-10 DIAGNOSIS — N179 Acute kidney failure, unspecified: Secondary | ICD-10-CM

## 2012-01-10 DIAGNOSIS — Z89619 Acquired absence of unspecified leg above knee: Secondary | ICD-10-CM

## 2012-01-10 DIAGNOSIS — Z7982 Long term (current) use of aspirin: Secondary | ICD-10-CM

## 2012-01-10 DIAGNOSIS — I472 Ventricular tachycardia, unspecified: Secondary | ICD-10-CM

## 2012-01-10 DIAGNOSIS — I70269 Atherosclerosis of native arteries of extremities with gangrene, unspecified extremity: Secondary | ICD-10-CM | POA: Diagnosis not present

## 2012-01-10 DIAGNOSIS — I824Y9 Acute embolism and thrombosis of unspecified deep veins of unspecified proximal lower extremity: Secondary | ICD-10-CM | POA: Diagnosis not present

## 2012-01-10 DIAGNOSIS — Z8249 Family history of ischemic heart disease and other diseases of the circulatory system: Secondary | ICD-10-CM | POA: Diagnosis not present

## 2012-01-10 DIAGNOSIS — I428 Other cardiomyopathies: Secondary | ICD-10-CM

## 2012-01-10 DIAGNOSIS — E871 Hypo-osmolality and hyponatremia: Secondary | ICD-10-CM

## 2012-01-10 DIAGNOSIS — Z87891 Personal history of nicotine dependence: Secondary | ICD-10-CM

## 2012-01-10 DIAGNOSIS — I253 Aneurysm of heart: Secondary | ICD-10-CM

## 2012-01-10 DIAGNOSIS — Z79899 Other long term (current) drug therapy: Secondary | ICD-10-CM

## 2012-01-10 DIAGNOSIS — N400 Enlarged prostate without lower urinary tract symptoms: Secondary | ICD-10-CM

## 2012-01-10 DIAGNOSIS — I2699 Other pulmonary embolism without acute cor pulmonale: Secondary | ICD-10-CM | POA: Diagnosis present

## 2012-01-10 DIAGNOSIS — Z7901 Long term (current) use of anticoagulants: Secondary | ICD-10-CM | POA: Diagnosis not present

## 2012-01-10 DIAGNOSIS — N19 Unspecified kidney failure: Secondary | ICD-10-CM

## 2012-01-10 DIAGNOSIS — I252 Old myocardial infarction: Secondary | ICD-10-CM

## 2012-01-10 DIAGNOSIS — I10 Essential (primary) hypertension: Secondary | ICD-10-CM | POA: Diagnosis present

## 2012-01-10 DIAGNOSIS — I214 Non-ST elevation (NSTEMI) myocardial infarction: Secondary | ICD-10-CM

## 2012-01-10 DIAGNOSIS — I82A19 Acute embolism and thrombosis of unspecified axillary vein: Secondary | ICD-10-CM

## 2012-01-10 DIAGNOSIS — D72829 Elevated white blood cell count, unspecified: Secondary | ICD-10-CM

## 2012-01-10 DIAGNOSIS — I824Z9 Acute embolism and thrombosis of unspecified deep veins of unspecified distal lower extremity: Secondary | ICD-10-CM

## 2012-01-10 DIAGNOSIS — I2510 Atherosclerotic heart disease of native coronary artery without angina pectoris: Secondary | ICD-10-CM | POA: Diagnosis not present

## 2012-01-10 DIAGNOSIS — N189 Chronic kidney disease, unspecified: Secondary | ICD-10-CM

## 2012-01-10 DIAGNOSIS — Z823 Family history of stroke: Secondary | ICD-10-CM

## 2012-01-10 DIAGNOSIS — I5022 Chronic systolic (congestive) heart failure: Secondary | ICD-10-CM

## 2012-01-10 DIAGNOSIS — I4729 Other ventricular tachycardia: Secondary | ICD-10-CM

## 2012-01-10 DIAGNOSIS — E785 Hyperlipidemia, unspecified: Secondary | ICD-10-CM | POA: Diagnosis not present

## 2012-01-10 DIAGNOSIS — I82419 Acute embolism and thrombosis of unspecified femoral vein: Secondary | ICD-10-CM | POA: Insufficient documentation

## 2012-01-10 LAB — LIPID PANEL
Cholesterol: 143 mg/dL (ref 0–200)
LDL Cholesterol: 93 mg/dL (ref 0–99)
Total CHOL/HDL Ratio: 5.7 RATIO
VLDL: 25 mg/dL (ref 0–40)

## 2012-01-10 LAB — BASIC METABOLIC PANEL
BUN: 23 mg/dL (ref 6–23)
CO2: 26 mEq/L (ref 19–32)
Chloride: 105 mEq/L (ref 96–112)
Creatinine, Ser: 1.77 mg/dL — ABNORMAL HIGH (ref 0.50–1.35)
Glucose, Bld: 98 mg/dL (ref 70–99)

## 2012-01-10 LAB — CBC
HCT: 34 % — ABNORMAL LOW (ref 39.0–52.0)
Hemoglobin: 11 g/dL — ABNORMAL LOW (ref 13.0–17.0)
MCH: 27.3 pg (ref 26.0–34.0)
RBC: 4.03 MIL/uL — ABNORMAL LOW (ref 4.22–5.81)

## 2012-01-10 LAB — PROTIME-INR: Prothrombin Time: 22.9 seconds — ABNORMAL HIGH (ref 11.6–15.2)

## 2012-01-10 MED ORDER — PROCHLORPERAZINE MALEATE 5 MG PO TABS
5.0000 mg | ORAL_TABLET | Freq: Four times a day (QID) | ORAL | Status: DC | PRN
  Filled 2012-01-10: qty 2

## 2012-01-10 MED ORDER — CYCLOBENZAPRINE HCL 5 MG PO TABS: 5.0000 mg | ORAL_TABLET | Freq: Three times a day (TID) | ORAL | Status: DC | PRN

## 2012-01-10 MED ORDER — TRAZODONE HCL 50 MG PO TABS
25.0000 mg | ORAL_TABLET | Freq: Every evening | ORAL | Status: DC | PRN
  Filled 2012-01-10: qty 1

## 2012-01-10 MED ORDER — PROCHLORPERAZINE EDISYLATE 5 MG/ML IJ SOLN
5.0000 mg | Freq: Four times a day (QID) | INTRAMUSCULAR | Status: DC | PRN
  Filled 2012-01-10: qty 2

## 2012-01-10 MED ORDER — ENOXAPARIN SODIUM 100 MG/ML ~~LOC~~ SOLN
100.0000 mg | Freq: Two times a day (BID) | SUBCUTANEOUS | Status: AC
  Administered 2012-01-10 – 2012-01-11 (×3): 100 mg via SUBCUTANEOUS
  Filled 2012-01-10 (×3): qty 1

## 2012-01-10 MED ORDER — WARFARIN - PHARMACIST DOSING INPATIENT: Freq: Every day | Status: DC

## 2012-01-10 MED ORDER — ATORVASTATIN CALCIUM 20 MG PO TABS
20.0000 mg | ORAL_TABLET | Freq: Every day | ORAL | Status: DC
  Filled 2012-01-10: qty 1

## 2012-01-10 MED ORDER — WARFARIN SODIUM 7.5 MG PO TABS
7.5000 mg | ORAL_TABLET | Freq: Once | ORAL | Status: DC
  Filled 2012-01-10: qty 1

## 2012-01-10 MED ORDER — DIPHENHYDRAMINE HCL 12.5 MG/5ML PO ELIX: 12.5000 mg | ORAL_SOLUTION | Freq: Four times a day (QID) | ORAL | Status: DC | PRN

## 2012-01-10 MED ORDER — HYDROCERIN EX CREA
TOPICAL_CREAM | Freq: Two times a day (BID) | CUTANEOUS | Status: DC
  Administered 2012-01-10 – 2012-01-24 (×23): via TOPICAL
  Filled 2012-01-10: qty 113

## 2012-01-10 MED ORDER — ATORVASTATIN CALCIUM 20 MG PO TABS
20.0000 mg | ORAL_TABLET | Freq: Every day | ORAL | Status: DC
  Administered 2012-01-10 – 2012-01-23 (×14): 20 mg via ORAL
  Filled 2012-01-10 (×15): qty 1

## 2012-01-10 MED ORDER — CARVEDILOL 6.25 MG PO TABS
6.2500 mg | ORAL_TABLET | Freq: Two times a day (BID) | ORAL | Status: DC
  Administered 2012-01-11 – 2012-01-24 (×26): 6.25 mg via ORAL
  Filled 2012-01-10 (×30): qty 1

## 2012-01-10 MED ORDER — BISACODYL 10 MG RE SUPP: 10.0000 mg | Freq: Every day | RECTAL | Status: DC | PRN

## 2012-01-10 MED ORDER — CARVEDILOL 6.25 MG PO TABS
6.2500 mg | ORAL_TABLET | Freq: Two times a day (BID) | ORAL | Status: DC
  Administered 2012-01-10: 6.25 mg via ORAL
  Filled 2012-01-10 (×2): qty 1

## 2012-01-10 MED ORDER — DUTASTERIDE 0.5 MG PO CAPS
0.5000 mg | ORAL_CAPSULE | Freq: Every evening | ORAL | Status: DC
  Administered 2012-01-11 – 2012-01-23 (×13): 0.5 mg via ORAL
  Filled 2012-01-10 (×14): qty 1

## 2012-01-10 MED ORDER — PROCHLORPERAZINE 25 MG RE SUPP
12.5000 mg | Freq: Four times a day (QID) | RECTAL | Status: DC | PRN
  Filled 2012-01-10: qty 1

## 2012-01-10 MED ORDER — CYCLOBENZAPRINE HCL 10 MG PO TABS
5.0000 mg | ORAL_TABLET | Freq: Three times a day (TID) | ORAL | Status: DC | PRN
  Administered 2012-01-10 – 2012-01-21 (×8): 5 mg via ORAL
  Filled 2012-01-10: qty 2
  Filled 2012-01-10 (×8): qty 1
  Filled 2012-01-10: qty 0.5

## 2012-01-10 MED ORDER — ALUM & MAG HYDROXIDE-SIMETH 200-200-20 MG/5ML PO SUSP: 30.0000 mL | ORAL | Status: DC | PRN

## 2012-01-10 MED ORDER — OXYCODONE HCL 5 MG PO TABS: 5.0000 mg | ORAL_TABLET | ORAL | Status: DC | PRN

## 2012-01-10 MED ORDER — OXYCODONE HCL 5 MG PO TABS
5.0000 mg | ORAL_TABLET | ORAL | Status: DC | PRN
  Administered 2012-01-11 – 2012-01-23 (×32): 10 mg via ORAL
  Administered 2012-01-24: 5 mg via ORAL
  Filled 2012-01-10 (×34): qty 2

## 2012-01-10 MED ORDER — DOCUSATE SODIUM 100 MG PO CAPS
100.0000 mg | ORAL_CAPSULE | Freq: Every day | ORAL | Status: DC
  Administered 2012-01-11 – 2012-01-16 (×5): 100 mg via ORAL
  Filled 2012-01-10 (×9): qty 1

## 2012-01-10 MED ORDER — WARFARIN SODIUM 7.5 MG PO TABS
7.5000 mg | ORAL_TABLET | Freq: Once | ORAL | Status: AC
  Administered 2012-01-10: 7.5 mg via ORAL
  Filled 2012-01-10: qty 1

## 2012-01-10 MED ORDER — GUAIFENESIN-DM 100-10 MG/5ML PO SYRP: 5.0000 mL | ORAL_SOLUTION | Freq: Four times a day (QID) | ORAL | Status: DC | PRN

## 2012-01-10 MED ORDER — ACETAMINOPHEN 325 MG PO TABS
325.0000 mg | ORAL_TABLET | ORAL | Status: DC | PRN
  Filled 2012-01-10: qty 2

## 2012-01-10 MED ORDER — ASPIRIN 81 MG PO CHEW
81.0000 mg | CHEWABLE_TABLET | Freq: Every day | ORAL | Status: DC
  Administered 2012-01-11 – 2012-01-24 (×14): 81 mg via ORAL
  Filled 2012-01-10 (×14): qty 1

## 2012-01-10 MED ORDER — ASPIRIN 81 MG PO CHEW
81.0000 mg | CHEWABLE_TABLET | Freq: Every day | ORAL | Status: DC
  Administered 2012-01-10: 81 mg via ORAL
  Filled 2012-01-10: qty 1

## 2012-01-10 MED ORDER — FLEET ENEMA 7-19 GM/118ML RE ENEM: 1.0000 | ENEMA | Freq: Once | RECTAL | Status: AC | PRN

## 2012-01-10 MED ORDER — PANTOPRAZOLE SODIUM 40 MG PO TBEC
40.0000 mg | DELAYED_RELEASE_TABLET | Freq: Every day | ORAL | Status: DC
Start: 2012-01-11 — End: 2012-01-24
  Administered 2012-01-11 – 2012-01-24 (×14): 40 mg via ORAL
  Filled 2012-01-10 (×13): qty 1

## 2012-01-10 MED ORDER — POLYETHYLENE GLYCOL 3350 17 G PO PACK
17.0000 g | PACK | Freq: Every day | ORAL | Status: DC | PRN
  Filled 2012-01-10: qty 1

## 2012-01-10 NOTE — Progress Notes (Signed)
Overall Plan of Care [facility]) Patient Details Name: Kevin [name] MRN: [id number] DOB: 07/04/1940  Diagnosis:  Left AKA  Primary Diagnosis:    Unilateral complete BKA Co-morbidities: pvd, cad,  Functional Problem List  Patient demonstrates impairments in the following areas: Balance, Endurance, Medication Management, Pain, Safety and Skin Integrity  Basic ADL's: grooming, bathing, dressing and toileting Advanced ADL's: none at this time  Transfers:  bed mobility, bed to chair, toilet, tub/shower and car Locomotion:  wheelchair mobility  Additional Impairments:  None  Anticipated Outcomes Item Anticipated Outcome  Eating/Swallowing    Basic self-care  Supervision - Mod I  Tolieting  Min assist  Bowel/Bladder  Pt is continent of bowel and bladder  Transfers  Supervision, Min assist with shower  Locomotion  Mod assist  Communication    Cognition    Pain  Pt will rate pain less than 3 on scale of 0-10, with a decrease in muscle spasms  Safety/Judgment  Pt will remain free from falls during admission  Other  Pt incision to right residual limb will heal with out signs and symptoms of infection   Therapy Plan: PT Frequency: 2-3 X/day, 60-90 minutes OT Frequency: 1-2 X/day, 60-90 minutes     Team Interventions: Item RN PT OT SLP SW TR Other  Self Care/Advanced ADL Retraining   x      Neuromuscular Re-Education  x       Therapeutic Activities  x x      UE/LE Strength Training/ROM  x x      UE/LE Coordination Activities         Visual/Perceptual Remediation/Compensation  x       DME/Adaptive Equipment Instruction  x x      Therapeutic Exercise  x x      Balance/Vestibular Training  x x      Patient/Family Education  x x      Cognitive Remediation/Compensation         Functional Mobility Training  x x      Ambulation/Gait Training  x       Museum/gallery curator  x       Wheelchair Propulsion/Positioning  x x      Functional Animator   x      Dysphagia/Aspiration Film/video editor         Bladder Management         Bowel Management         Disease Management/Prevention  x x      Pain Management  x x      Medication Management         Skin Care/Wound Management  x x      Splinting/Orthotics  x       Discharge Planning  x x      Psychosocial Support  x x                         Team Discharge Planning: Destination:  Home Projected Follow-up:  PT, OT and Home Health Projected Equipment Needs:  Drop arm commode Patient/family involved in discharge planning:  Yes  MD ELOS: 12 days Medical Rehab Prognosis:  Excellent Assessment: pt admitted for cir therapies. The team will be addressing ROM, balance, pre-pros ed, safety, adl's, pain mgt. Goals are supervision to min assist, min-mod with locomotion.

## 2012-01-10 NOTE — Discharge Summary (Signed)
Physician Discharge Summary  Patient ID: GERGORY BIELLO MRN: [id number] DOB/AGE: 72-28-1942 72 y.o.  Admit date: 12/31/2011 Discharge date: 01/10/2012  Primary Care Physician:  Sheila Oats, MD  Discharge Diagnoses:    .Ischemic leg s/p left above knee amputation on 01/05/12 .HTN (hypertension) .CAD (coronary artery disease) .CHF (congestive heart failure) .Tobacco abuse .Hyperlipidemia .Severe sepsis/ Septic shock resolved .Renal mass, right, probably a cyst per MRI   Consults: [facility]- Dr Tenny Craw                   Vascular surgery- Dr Early                   Critical care service  Discharge Medications:   Medication List     As of 01/10/2012 12:57 PM    STOP taking these medications         aspirin 325 MG tablet      carvedilol 12.5 MG tablet   Commonly known as: COREG      cyclobenzaprine 10 MG tablet   Commonly known as: FLEXERIL      diazepam 2 MG tablet   Commonly known as: VALIUM      furosemide 40 MG tablet   Commonly known as: LASIX      ibuprofen 200 MG tablet   Commonly known as: ADVIL,MOTRIN      lisinopril 20 MG tablet   Commonly known as: PRINIVIL,ZESTRIL      sulindac 200 MG tablet   Commonly known as: CLINORIL      TAKE these medications         atorvastatin 20 MG tablet   Commonly known as: LIPITOR   Take 20 mg by mouth daily.      docusate sodium 100 MG capsule   Commonly known as: COLACE   Take 100 mg by mouth daily.      dutasteride 0.5 MG capsule   Commonly known as: AVODART   Take 0.5 mg by mouth every evening.      oxyCODONE-acetaminophen 10-325 MG per tablet   Commonly known as: PERCOCET   Take 1 tablet by mouth 2 (two) times daily as needed. For pain       Inpatient medications currently should be continued on final discharge: Aspirin 81 mg daily  Lipitor 20 mg daily  Coreg 6.25 mg daily  Colace 100 mg daily  Avodart 0.5 mg each bedtime Continue Lovenox inj 100 mg  q12 hours for one more day  (will be day 5 tomorrow on 01/11/2012), then OFF Coumadin per pharmacy Protonix 40 mg daily Baclofen 20 mg 4 times a day PRN muscle spasms Oxycodone 5 mg q4hours PRN pain   Brief H and P: For complete details please refer to admission H and P, but in brief patient is a 71 year old male with history of hypertension, CHF, hyperlipidemia, coronary disease presented to ED with a month history of left leg swelling and multiple ulcers worsened in the last 2-3 days prior to admission. Patient also had increased pain but no fevers or chills. He also noticed that her toes were turning toward him with a foul-smelling odor to it. Evaluation in the ED included a white count of 17.8 K. and creatinine of 1.59. Hospitalist service was requested to admit patient for foot ischemia and cellulitis.   Hospital Course:  71 yo male with a hx of HTN, CRI(unknown base creatine but 1.59 on 10-18), chronic chf with reduced EF of 20%. He presented to [facility] ED 10-18 with  left great toe gangrene , ischemia of lower extremities and evaluated by [facility]. On 10/19 while on floor he had hypotension and was transferred to ICU with presumed sepsis, increased renal dysfunction and PCCM was asked to assume his care. Patient was found to be in profound hypotension in the setting of sepsis from gangrene of left foot in lower extremity ischemia. Patient was placed on vasopressors with broad-spectrum IV antibiotics with vancomycin and Zosyn. Patient has a baseline creatinine of 1.5 he was also noted to have acute renal failure with a creatinine of 3.35 on 01/02/2012 likely from septic shock. Renal service was consulted as well. Patient had a complicated course of hospitalization with acute pulmonary embolism and right leg DVT. Patient was started on heparin drip. Vascular surgery was consulted and patient underwent IVC filter and eventually left above-knee amputation on 01/05/2012. Patient was closely followed by vascular surgery, and vancomycin and  Zosyn were started today on 01/10/2012 after discussion with to surgery. Patient has remained afebrile with no leukocytosis, repetition site is healing with no evidence of skin breakdown. He is postop day 5 from the left above-knee the patient. Coumadin was also started on 01/07/2012. He needs one more day of Lovenox and subsequently continue the Coumadin. He will eventually need the removal of the IVC filter. Cultures:  10-20 bc x 2> negative so far  10-20 UC> negative  10-20 sputum> oral flora  Antibiotics:  10-18 vanc>> DC'ed today 10-18 zoysn>> DC'ed today  Tests and imaging:  10/20 Echo : EF 50 to 55%, severe RV dilation, mod/severe RA dilation, PAS 34 mmHg  10/21 V/Q : Matched defect within the superior segment of right lower lobe and heterogeneous perfusion to the left lung. Intermediate probability acute pulmonary embolism.  10/21 Doppler b/l legs: acute DVT of Rt common femoral, profunda, femoral, popliteal, posterior tibial, and peroneal veins.  10/21 Renal U/S: 1.9 x 1.3 x 1.3 cm lesion upper pole Rt kidney  10/25 MRI of the abdomen: Small lesion in the upper pole right kidney is some optimally followup in approximately 6 months could be performed.  Events and procedure:  10/21 IVC filter placed by VVS  10/22 Off pressors  10/23 Left AKA   Day of Discharge BP 112/80  Pulse 60  Temp 98 F (36.7 C) (Oral)  Resp 20  Ht 5\' 8"  (1.727 m)  Wt 96.299 kg (212 lb 4.8 oz)  BMI 32.28 kg/m2  SpO2 97%  Physical Exam: General: Alert and awake oriented x3 not in any acute distress. HEENT: anicteric sclera, pupils reactive to light and accommodation CVS: S1-S2 clear, no murmur rubs or gallops Chest: clear to auscultation bilaterally, no wheezing rales or rhonchi Abdomen: soft nontender, nondistended, normal bowel sounds, no organomegaly Extremities: Left AKA, no obvious bleeding or discharge.   The results of significant diagnostics from this hospitalization (including imaging,  microbiology, ancillary and laboratory) are listed below for reference.    LAB RESULTS: Basic Metabolic Panel:  Lab 01/10/12 1610 01/09/12 0834 01/07/12 0650  NA 137 136 --  K 4.7 4.4 --  CL 105 104 --  CO2 26 24 --  GLUCOSE 98 105* --  BUN 23 25* --  CREATININE 1.77* 1.60* --  CALCIUM 8.6 8.4 --  MG -- -- 1.9  PHOS -- -- --   CBC:  Lab 01/10/12 0540 01/07/12 0650  WBC 7.9 10.5  NEUTROABS -- --  HGB 11.0* 12.1*  HCT 34.0* 36.2*  MCV 84.4 --  PLT 186 149*   Cardiac  Enzymes: No results found for this basename: CKTOTAL:2,CKMB:2,CKMBINDEX:2,TROPONINI:2 in the last 168 hours BNP: No components found with this basename: POCBNP:2 CBG:  Lab 01/05/12 1949  GLUCAP 134*    Significant Diagnostic Studies:  Dg Foot Complete Left  2012/01/09  *RADIOLOGY REPORT*  Clinical Data: foot pain  LEFT FOOT - COMPLETE 3+ VIEW  Comparison: None  Findings: The bones are diffusely osteopenic.  There is mild diffuse soft tissue swelling.  Plantar are and posterior calcaneal heel spurs noted.  Degenerative type changes noted involving the tibiotalar joint.  No fracture or subluxation noted.  IMPRESSION:  1.  No acute bony abnormalities.   Original Report Authenticated By: Rosealee Albee, M.D.      Disposition and Follow-up:     Discharge Orders    Future Appointments: Provider: Department: Dept Phone: Center:   02/01/2012 2:45 PM Larina Earthly, MD [facility] [phone] [facility]       DISPOSITION: Inpatient rehabilitation  DIET: Heart healthy diet  ACTIVITY: As tolerated  TESTS THAT NEED FOLLOW-UP PT INR, BMET  DISCHARGE FOLLOW-UP Follow-up Information    Follow up with EARLY, TODD, MD. In 4 weeks. (Office will call you to arrange your appt (sent))    Contact information:   [address] [place] [address] [phone]          Time spent on Discharge: 45 mins  Signed:   Zoha Spranger M.D. [facility] 01/10/2012, 12:57 PM Pager: [phone]

## 2012-01-10 NOTE — Progress Notes (Signed)
Orthopedic Tech Progress Note Patient Details:  Kevin [name] [birth year] [id number]  Patient ID: [name], male   DOB: 18-Aug-1940, 71 y.o.   MRN: [id number]   [name] 01/10/2012, 2:41 PM LEFT RETENTION SOCK FOR AKA COMPLETED BY [employer]

## 2012-01-10 NOTE — Progress Notes (Signed)
Occupational Therapy Treatment Patient Details Name: Kevin [name] MRN: [id number] DOB: December 18, 1940 Today's Date: 01/10/2012 Time: 1191-4782 OT Time Calculation (min): 33 min  OT Assessment / Plan / Recommendation Comments on Treatment Session Pt completed stand pivot transfer x2, performing much better on second transfer. Requires max verbal cues throughout for safety and sequencing.     Follow Up Recommendations  Inpatient Rehab;Supervision/Assistance - 24 hour    Barriers to Discharge       Equipment Recommendations  3 in 1 bedside comode    Recommendations for Other Services Rehab consult  Frequency Min 2X/week   Plan Discharge plan remains appropriate    Precautions / Restrictions Precautions Precautions: Fall Restrictions Weight Bearing Restrictions: No   Pertinent Vitals/Pain None stated    ADL  Toilet Transfer: Performed;+2 Total assistance Toilet Transfer: Patient Percentage: 30% Toilet Transfer Method: Stand pivot Toilet Transfer Equipment: Bedside commode Toileting - Clothing Manipulation and Hygiene: Performed;Independent Where Assessed - Toileting Clothing Manipulation and Hygiene: Sit to stand from 3-in-1 or toilet Equipment Used: Rolling walker;Gait belt Transfers/Ambulation Related to ADLs: Sit to stand and stand pivot transfer +2(A) with max verbal cues for sequencing and safety. Required (A) to facilitate pelvis and chest for upright posture. Physical facilitation to (A) right LE during stand pivot.  ADL Comments: Performed bed mobility with min (A). Required +2 (A) for all transfers using RW and physical facilitation at the pelvis and chest for upright posture. Completed stand pivot x2 from bed to 3n1 then 3n1 to chair.  Performed better during second stand pivot. Educated pt on desensitization of residual limb and leg exercises. Pt performed UE strengthening with chair pushups.     OT Diagnosis:    OT Problem List:   OT Treatment Interventions:      OT Goals Acute Rehab OT Goals OT Goal Formulation: With patient Time For Goal Achievement: 01/20/12 Potential to Achieve Goals: Good ADL Goals Pt Will Perform Lower Body Bathing: with mod assist;Sit to stand from chair;Sit to stand from bed Pt Will Perform Lower Body Dressing: with mod assist;Sit to stand from chair;Sit to stand from bed Pt Will Transfer to Toilet: with mod assist;3-in-1;Stand pivot transfer ADL Goal: Toilet Transfer - Progress: Progressing toward goals Pt Will Perform Toileting - Clothing Manipulation: with mod assist;Standing ADL Goal: Toileting - Clothing Manipulation - Progress: Progressing toward goals Additional ADL Goal #1: Pt. will be independent with general UE strengthening HEP ADL Goal: Additional Goal #1 - Progress: Progressing toward goals  Visit Information  Last OT Received On: 01/10/12 Assistance Needed: +2 PT/OT Co-Evaluation/Treatment: Yes    Subjective Data      Prior Functioning       Cognition  Overall Cognitive Status: Appears within functional limits for tasks assessed/performed Arousal/Alertness: Awake/alert Orientation Level: Appears intact for tasks assessed Behavior During Session: [facility] for tasks performed    Mobility   Bed Mobility Bed Mobility: Sitting - Scoot to Edge of Bed;Supine to Sit Supine to Sit: HOB elevated;4: Min assist Sitting - Scoot to [place] of Bed: 4: Min assist Details for Bed Mobility Assistance: Pt. needed cues for doing as much of the bed mobility  himself as possible. Pt. was able to pull himself using SPTA and bed rails to a sitting position at EOB with HOB elevated. Pt. able to scoot forward on EOB so R LE coul be on floor. Transfers Transfers: Sit to Stand;Stand to Sit Sit to Stand: With upper extremity assist;From bed;1: +2 Total assist Sit to Stand: Patient Percentage:  30% Stand to Sit: 1: +2 Total assist;With upper extremity assist;To chair/3-in-1 Stand to Sit: Patient Percentage: 10% Details for  Transfer Assistance: Pt. needed max cues & (A) for rising from surface using walker. Pt's posture upon standing significantly flexed forward and pt. would attempt to stand upright with VC's. Pt. moved RLE minimally and needed max VC's to try and keep the RLE positioned under him.       Exercises  General Exercises - Upper Extremity Chair Push Up: AROM;Strengthening;Both;10 reps;Seated        End of Session OT - End of Session Equipment Utilized During Treatment: Gait belt Activity Tolerance: Patient tolerated treatment well Patient left: in chair;with call bell/phone within reach Nurse Communication: Mobility status  GO     Cleora Fleet 01/10/2012, 10:43 AM

## 2012-01-10 NOTE — PMR Pre-admission (Signed)
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 male with history of CAD, LLE ulcers, admitted on 01/01/12 with pain, cellulitis and ischemia. Started on IV antibiotics and amputation recommended by Dr. Darrick Penna. Patient with leucocytosis and renal failure. Patient developed hypotension due to septic shock on 10/20. Dr Eliott Nine consulted and felt AKI due to multifactorial and fluid resuscitation with current care. 2D echo revealed severely dilated RV with EF 50-55%. Cardiology recommended and expressed concerns of PE and V/Q scan recommended. V/Q scan done revealing defects in RLL and L lobe. LE doppler positive for DVT throughout RLE. IVC filter placed 10/21 and patient underwent L-AKA 10/23 by Dr. Arbie Cookey. Renal failure resolved.  Retention sock ordered by [employer]. Pain is well-controlled, phantom sensation but not phantom pain      Past Medical History  Past Medical History  Diagnosis Date  . Arthritis   . Hypertension   . CHF (congestive heart failure)     a. NICM/acute CHF  2007 with EF 10-20%. b. Improved EF 50-55% by echo 12/2011.  Marland Kitchen Hyperlipidemia   . Coronary artery disease     a. NSTEMI 2007 with cath showing mild nonobstructive CAD by cath 2007 (40% mLAD, 40%mRCA) at time of dx of NICM.  Marland Kitchen Tortuous aorta     a. Noted by cath 2007, recommendation for OP [place].  Marland Kitchen BPH (benign prostatic hyperplasia)   . Wide-complex tachycardia     a. Noted 2007.  . Obesity   . Valvular heart disease     a. Mod MR by echo 06/2005 but no significant MR 2013.   Family History  family history includes CAD in his brother; Heart attack in his mother; and Stroke in his mother.  Prior Rehab/Hospitalizations: none  Current Medications  Current facility-administered medications:0.9 %  sodium chloride infusion, , Intravenous, Continuous, Coralyn Helling, MD, Last Rate: 10 mL/hr at 01/10/12 0200, 10 mL/hr at 01/10/12 0200;  0.9 %  sodium chloride infusion, , Intravenous, Continuous, Ripudeep K Rai, MD, Last Rate: 75 mL/hr at 01/09/12 0500;  aspirin chewable tablet 81 mg, 81 mg, Oral, Daily, Ripudeep K Rai, MD, 81 mg at 01/10/12 1350 atorvastatin (LIPITOR) tablet 20 mg, 20 mg, Oral, q1800, Roger A Arguello, PA-C;  baclofen (LIORESAL) tablet 20 mg, 20 mg, Oral, QID PRN, Merwyn Katos, MD, 20 mg at 01/10/12 1153;  carvedilol (COREG) tablet 6.25  mg, 6.25 mg, Oral, BID WC, Ripudeep K Rai, MD;  docusate sodium (COLACE) capsule 100 mg, 100 mg, Oral, Daily, Houston Siren, MD, 100 mg at 01/10/12 1035 dutasteride (AVODART) capsule 0.5 mg, 0.5 mg, Oral, QPM, Houston Siren, MD, 0.5 mg at 01/09/12 1704;  enoxaparin (LOVENOX) injection 100 mg, 100 mg, Subcutaneous, Q12H, Arman Filter, RPH, 100 mg at 01/10/12 1034;  fentaNYL (SUBLIMAZE) injection 25-100 mcg, 25-100 mcg, Intravenous, Q2H PRN, Merwyn Katos, MD, 50 mcg at 01/05/12 1935;  ondansetron [facility]) injection 4 mg, 4 mg, Intravenous, Q6H PRN, Houston Siren, MD, 4 mg at 01/02/12 1601 oxyCODONE (Oxy IR/ROXICODONE) immediate release tablet 5 mg, 5 mg, Oral, Q4H  PRN, Merwyn Katos, MD, 5 mg at 01/10/12 1356;  pantoprazole (PROTONIX) EC tablet 40 mg, 40 mg, Oral, Daily, Storm Frisk, MD, 40 mg at 01/10/12 1038;  warfarin (COUMADIN) tablet 10 mg, 10 mg, Oral, ONCE-1800, Ripudeep K Rai, MD, 10 mg at 01/09/12 1737;  warfarin (COUMADIN) tablet 7.5 mg, 7.5 mg, Oral, ONCE-1800, Brett Fairy, PHARMD Warfarin - Pharmacist Dosing Inpatient, , Does not apply, q1800, Lavonia Dana, PHARMD;  DISCONTD: aspirin tablet 325 mg, 325 mg, Oral, Daily, Houston Siren, MD, 325 mg at 01/10/12 1035;  DISCONTD: carvedilol (COREG) tablet 3.125 mg, 3.125 mg, Oral, BID WC, Rhonda G Barrett, PA, 3.125 mg at 01/10/12 0981;  DISCONTD: piperacillin-tazobactam (ZOSYN) IVPB 3.375 g, 3.375 g, Intravenous, Q8H, Storm Frisk, MD, 3.375 g at 01/10/12 1914 DISCONTD: vancomycin (VANCOCIN) IVPB 1000 mg/200 mL premix, 1,000 mg, Intravenous, Q24H, Storm Frisk, MD, 1,000 mg at 01/09/12 1500  Patients Current Diet: Carb Control  Precautions / Restrictions Precautions Precautions: Fall Restrictions Weight Bearing Restrictions: No   Prior Activity Level Community (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, newspaper / Equipment Home Assistive Devices/Equipment: Wheelchair Home Adaptive Equipment: Grab bars around toilet;Grab bars in shower;Hand-held shower hose;Shower chair with back;Walker - rolling;Wheelchair - manual  Prior Functional Level Prior Function Level of Independence: Needs assistance Needs Assistance: Meal Prep;Light Housekeeping;Transfers;Gait Meal Prep: Total Light Housekeeping: Total Gait Assistance: Used wheelchair as primary mode of locomotion Transfer Assistance: Transferred to/from wheelchair modified independently per pt report Able to Take Stairs?: Yes (with assist from nephew and son) Driving: No Vocation: Retired  Current Functional Level Cognition  Arousal/Alertness: Awake/alert Overall Cognitive Status: Appears within functional limits for tasks  assessed/performed Orientation Level: Oriented X4    Extremity Assessment (includes Sensation/Coordination)  RUE ROM/Strength/Tone: Within functional levels RUE Sensation: WFL - Light Touch RUE Coordination: WFL - gross/fine motor  RLE ROM/Strength/Tone: Deficits RLE ROM/Strength/Tone Deficits: Grossly weak, 3+/5 RLE Sensation: WFL - Light Touch    ADLs  Eating/Feeding: Independent Where Assessed - Eating/Feeding: Chair Grooming: Set up Where Assessed - Grooming: Supported sitting Upper Body Bathing: Set up Where Assessed - Upper Body Bathing: Supported sitting Lower Body Bathing: +2 Total assistance Lower Body Bathing: Patient Percentage: 30% Where Assessed - Lower Body Bathing: Unsupported sit to stand Upper Body Dressing: Set up Where Assessed - Upper Body Dressing: Unsupported sitting Lower Body Dressing: +2 Total assistance Lower Body Dressing: Patient Percentage: 0% Where Assessed - Lower Body Dressing: Supported sit to stand Toilet Transfer: Performed;+2 Total assistance Toilet Transfer: Patient Percentage: 30% Statistician Method: Surveyor, minerals: Materials engineer and Hygiene: Performed;Independent Where Assessed - Toileting Clothing Manipulation and Hygiene: Sit to stand from 3-in-1 or toilet Equipment Used: Rolling walker;Gait belt Transfers/Ambulation Related to ADLs: Sit to stand and stand pivot transfer +2(A) with  max verbal cues for sequencing and safety. Required (A) to facilitate pelvis and chest for upright posture. Physical facilitation to (A) right LE during stand pivot.  ADL Comments: Performed bed mobility with min (A). Required +2 (A) for all transfers using RW and physical facilitation at the pelvis and chest for upright posture. Completed stand pivot x2 from bed to 3n1 then 3n1 to chair.  Performed better during second stand pivot. Educated pt on desensitization of residual limb and leg exercises. Pt  performed UE strengthening with chair pushups.     Mobility  Bed Mobility: Sitting - Scoot to Edge of Bed;Supine to Sit Supine to Sit: HOB elevated;4: Min assist Sitting - Scoot to [place] of Bed: 4: Min assist    Transfers  Transfers: Sit to Stand;Stand to [employer] Transfers Sit to Stand: With upper extremity assist;1: +2 Total assist;With armrests;From bed;From chair/3-in-1 Sit to Stand: Patient Percentage: 30% Stand to Sit: 1: +2 Total assist;With upper extremity assist;With armrests;To chair/3-in-1 Stand to Sit: Patient Percentage: 10% Stand Pivot Transfers: 1: +2 Total assist;With armrests Stand Pivot Transfers: Patient Percentage: 30%    Ambulation / Gait / Stairs / Wheelchair Mobility  Ambulation/Gait Ambulation/Gait Assistance: Not tested (comment) Stairs: No Wheelchair Mobility Wheelchair Mobility: No    Posture / Holiday representative Standing - Balance Support: Bilateral upper extremity supported Static Standing - Level of Assistance: 1: +2 Total assist Static Standing - Comment/# of Minutes: pt maintains very flexed posture with flexed hips and knees.  Requires 2 person A to attempt to extend hips/trunk.       Previous Home Environment Living Arrangements: Children Lives With: Family;Son (son and nephew) Available Help at Discharge: Family;Available 24 hours/day Type of Home: Apartment Home Layout: One level Home Access: Stairs to enter (plan for ramp) Entrance Stairs-Rails: Can reach both;Left;Right Entrance Stairs-Number of Steps: 4 Bathroom Shower/Tub: Health visitor: Handicapped height Bathroom Accessibility: Yes How Accessible: Accessible via wheelchair;Accessible via walker Home Care Services: No  Discharge Living Setting Plans for Discharge Living Setting: Lives with (comment) (Son and nephew) Type of Home at Discharge: Apartment Discharge Home Access: Ramped entrance Do you have any problems obtaining your  medications?: No  Social/Family/Support Systems Patient Roles: Parent Contact Information: [phone] Anticipated Caregiver: Son: Srinivas Lippman Anticipated Caregiver's Contact Information: Bronte Kropf: [phone] Ability/Limitations of Caregiver: none Caregiver Availability: 24/7 Discharge Plan Discussed with Primary Caregiver: Yes Is Caregiver In Agreement with Plan?: Yes Does Caregiver/Family have Issues with Lodging/Transportation while Pt is in Rehab?: No  Goals/Additional Needs Patient/Family Goal for Rehab: PT&OT: Min A- S Expected length of stay: 7-10 days Cultural Considerations: none Dietary Needs: Carb Moified Equipment Needs: TBD Pt/Family Agrees to Admission and willing to participate: Yes Program Orientation Provided & Reviewed with Pt/Caregiver Including Roles  & Responsibilities: Yes  Patient Condition: Please see physician update to information in consult dated 01/07/12.  Preadmission Screen Completed By:  Meryl Dare, 01/10/2012 1:56 PM ______________________________________________________________________   Discussed status with Dr. Riley Kill on 01/10/12 at 1407 and received telephone approval for admission today.  Admission Coordinator:  Meryl Dare, [id number] /Date10/28/13

## 2012-01-10 NOTE — H&P (Signed)
Physical Medicine and Rehabilitation Admission H&P  Chief Complaint   Patient presents with   .    :  HPI: Kevin [name] is a 71 y.o. male with history of CAD, LLE ulcers, admitted on 01/01/12 with pain, cellulitis and ischemia. Started on IV antibiotics and amputation recommended by Dr. Darrick Penna. Patient with leucocytosis and renal failure. Patient developed hypotension due to septic shock on 10/20. Dr Eliott Nine consulted and felt AKI due to multifactorial due to hypotension, sepsis, as well as ace/Nsaids and eecommended continuing fluid resuscitation. 2D echo revealed severely dilated RV with EF 50-55%. Cardiology recommended and expressed concerns of PE and V/Q scan recommended. Patient with elevated cardiac enzymes secondary to NSTEMI due to sepsis. V/Q scan done revealing defects in RLL and L lobe. LE doppler positive for DVT throughout RLE. IVC filter placed 10/21 and patient underwent L-AKA 10/23 by Dr. Arbie Cookey. Renal failure resolved. Started on coumadin for treatment of PE/DVT. Patient was noted to have episode of NSVT today and cardiology felt this was multifactorial. Recommendations for patient to have nuclear stress study on outpatient basis and coreg dose increased. Therapies initiated and working on standing and posture. Therapy team recommending CIR for progression.  Review of Systems  HENT: Negative for hearing loss.  Eyes: Negative for blurred vision and double vision.  Respiratory: Negative for shortness of breath.  Cardiovascular: Negative for chest pain and palpitations.  Gastrointestinal: Negative for nausea and abdominal pain.  Genitourinary: Negative for urgency and frequency.  Musculoskeletal: Positive for joint pain.  Spasms LLE  Neurological: Negative for headaches.  Psychiatric/Behavioral: Negative for depression. The patient does not have insomnia.   Past Medical History   Diagnosis  Date   .  Arthritis    .  Hypertension    .  CHF (congestive heart failure)      a.  NICM/acute CHF 2007 with EF 10-20%. b. Improved EF 50-55% by echo 12/2011.   Marland Kitchen  Hyperlipidemia    .  Coronary artery disease      a. NSTEMI 2007 with cath showing mild nonobstructive CAD by cath 2007 (40% mLAD, 40%mRCA) at time of dx of NICM.   Marland Kitchen  Tortuous aorta      a. Noted by cath 2007, recommendation for OP [place].   Marland Kitchen  BPH (benign prostatic hyperplasia)    .  Wide-complex tachycardia      a. Noted 2007.   .  Obesity    .  Valvular heart disease      a. Mod MR by echo 06/2005 but no significant MR 2013.    Past Surgical History   Procedure  Date   .  No past surgeries    .  Vena cava filter placement  01/03/2012     Procedure: INSERTION VENA-CAVA FILTER; Surgeon: Larina Earthly, MD; Location: [facility] OR; Service: Vascular; Laterality: Left;   .  Amputation  01/05/2012     Procedure: AMPUTATION ABOVE KNEE; Surgeon: Larina Earthly, MD; Location: [facility] OR; Service: Vascular; Laterality: Left;    Family History   Problem  Relation  Age of Onset   .  Heart attack  Mother       In her 73's    .  Stroke  Mother       In her 48's    .  CAD  Brother     Social History: Lives with family. Impaired mobility for past 10 months and nonambulatory for past month due to LLE ulcers/weakness. He reports that  he has quit smoking 2 weeks ago-used to smoke 1 PPD. He uses smokeless tobacco. He reports that he does not drink alcohol or use illicit drugs. Nephew at home and son checks in during the day.  Allergies: No Known Allergies  Scheduled Meds:  .  aspirin  81 mg  Oral  Daily   .  atorvastatin  20 mg  Oral  q1800   .  carvedilol  6.25 mg  Oral  BID WC   .  docusate sodium  100 mg  Oral  Daily   .  dutasteride  0.5 mg  Oral  QPM   .  enoxaparin (LOVENOX) injection  100 mg  Subcutaneous  Q12H   .  pantoprazole  40 mg  Oral  Daily   .  warfarin  10 mg  Oral  ONCE-1800   .  warfarin  7.5 mg  Oral  ONCE-1800   .  Warfarin - Pharmacist Dosing Inpatient   Does not apply  q1800   .  DISCONTD: aspirin  325 mg   Oral  Daily   .  DISCONTD: carvedilol  3.125 mg  Oral  BID WC   .  DISCONTD: piperacillin-tazobactam (ZOSYN) IV  3.375 g  Intravenous  Q8H   .  DISCONTD: vancomycin  1,000 mg  Intravenous  Q24H    Medications Prior to Admission   Medication  Sig  Dispense  Refill   .  atorvastatin (LIPITOR) 20 MG tablet  Take 20 mg by mouth daily.     Marland Kitchen  docusate sodium (COLACE) 100 MG capsule  Take 100 mg by mouth daily.     Marland Kitchen  dutasteride (AVODART) 0.5 MG capsule  Take 0.5 mg by mouth every evening.     Marland Kitchen  oxyCODONE-acetaminophen (PERCOCET) 10-325 MG per tablet  Take 1 tablet by mouth 2 (two) times daily as needed. For pain     .  DISCONTD: aspirin 325 MG tablet  Take 325 mg by mouth daily.     Marland Kitchen  DISCONTD: carvedilol (COREG) 12.5 MG tablet  Take 12.5 mg by mouth 2 (two) times daily with a meal.     .  DISCONTD: cyclobenzaprine (FLEXERIL) 10 MG tablet  Take 10 mg by mouth 3 (three) times daily as needed. For muscle spasms     .  DISCONTD: diazepam (VALIUM) 2 MG tablet  Take 2 mg by mouth 3 (three) times daily.     Marland Kitchen  DISCONTD: furosemide (LASIX) 40 MG tablet  Take 40 mg by mouth at bedtime.     Marland Kitchen  DISCONTD: ibuprofen (ADVIL,MOTRIN) 200 MG tablet  Take 200 mg by mouth every 6 (six) hours as needed. For pain     .  DISCONTD: lisinopril (PRINIVIL,ZESTRIL) 20 MG tablet  Take 20 mg by mouth daily.     Marland Kitchen  DISCONTD: sulindac (CLINORIL) 200 MG tablet  Take 200 mg by mouth 2 (two) times daily.      Home:  Home Living  Lives With: Family;Son (son and nephew)  Available Help at Discharge: Family;Available 24 hours/day  Type of Home: Apartment  Home Access: Stairs to enter (plan for ramp)  Entrance Stairs-Number of Steps: 4  Entrance Stairs-Rails: Can reach both;Left;Right  Home Layout: One level  Bathroom Shower/Tub: Pension scheme manager: Handicapped height  Bathroom Accessibility: Yes  How Accessible: Accessible via wheelchair;Accessible via walker  Home Adaptive Equipment: Grab bars around  toilet;Grab bars in shower;Hand-held shower hose;Shower chair with back;Walker - rolling;Wheelchair -  manual  Functional History:  Prior Function  Meal Prep: Total  Light Housekeeping: Total  Able to Take Stairs?: Yes (with assist from nephew and son)  Driving: No  Vocation: Retired  Functional Status:  Mobility:  Bed Mobility  Bed Mobility: Sitting - Scoot to [place] of Bed;Supine to Sit  Supine to Sit: HOB elevated;4: Min assist  Sitting - Scoot to [place] of Bed: 4: Min assist  Transfers  Transfers: Sit to Stand;Stand to [employer] Transfers  Sit to Stand: With upper extremity assist;1: +2 Total assist;With armrests;From bed;From chair/3-in-1  Sit to Stand: Patient Percentage: 30%  Stand to Sit: 1: +2 Total assist;With upper extremity assist;With armrests;To chair/3-in-1  Stand to Sit: Patient Percentage: 10%  Stand Pivot Transfers: 1: +2 Total assist;With armrests  Stand Pivot Transfers: Patient Percentage: 30%  Ambulation/Gait  Ambulation/Gait Assistance: Not tested (comment)  Stairs: No  Wheelchair Mobility  Wheelchair Mobility: No  ADL:  ADL  Eating/Feeding: Independent  Where Assessed - Eating/Feeding: Chair  Grooming: Set up  Where Assessed - Grooming: Supported sitting  Upper Body Bathing: Set up  Where Assessed - Upper Body Bathing: Supported sitting  Lower Body Bathing: +2 Total assistance  Where Assessed - Lower Body Bathing: Unsupported sit to stand  Upper Body Dressing: Set up  Where Assessed - Upper Body Dressing: Unsupported sitting  Lower Body Dressing: +2 Total assistance  Where Assessed - Lower Body Dressing: Supported sit to stand  Toilet Transfer: Performed;+2 Total assistance  Toilet Transfer Method: Archivist: Bedside commode  Equipment Used: Rolling walker;Gait belt  Transfers/Ambulation Related to ADLs: Sit to stand and stand pivot transfer +2(A) with max verbal cues for sequencing and safety. Required (A) to  facilitate pelvis and chest for upright posture. Physical facilitation to (A) right LE during stand pivot.  ADL Comments: Performed bed mobility with min (A). Required +2 (A) for all transfers using RW and physical facilitation at the pelvis and chest for upright posture. Completed stand pivot x2 from bed to 3n1 then 3n1 to chair. Performed better during second stand pivot. Educated pt on desensitization of residual limb and leg exercises. Pt performed UE strengthening with chair pushups.  Cognition:  Cognition  Arousal/Alertness: Awake/alert  Orientation Level: Oriented X4  Cognition  Overall Cognitive Status: Appears within functional limits for tasks assessed/performed  Arousal/Alertness: Awake/alert  Orientation Level: Appears intact for tasks assessed  Behavior During Session: [facility] for tasks performed  Blood pressure 138/77, pulse 69, temperature 97 F (36.1 C), temperature source Oral, resp. rate 20, height 5\' 8"  (1.727 m), weight 96.299 kg (212 lb 4.8 oz), SpO2 100.00%.  Physical Exam  Nursing note and vitals reviewed.  Constitutional: He is oriented to person, place, and time. He appears well-developed and well-nourished.  HENT:  Head: Normocephalic and atraumatic.  Eyes: Pupils are equal, round, and reactive to light.  Neck: Normal range of motion.  Cardiovascular: Normal rate and regular rhythm.  Pulmonary/Chest: Effort normal and breath sounds normal.  Abdominal: Soft. Bowel sounds are normal.  Musculoskeletal: He exhibits edema (moderate edema L-AKA).  Neurological: He is alert and oriented to person, place, and time.  Follows basic commands without difficulty. Strength grossly 4/5 ue's. RLE is 3-4/5 lle is limited due to pain, grossly 3/5.  Skin: Skin is warm and dry.  Multiple ecchymotic areas right forearm, left flank and area on abdomen. Dry skin right foot. L-AKA with small amount of sero-sanguinous drainage on dressing.   Results for orders placed during  the hospital  encounter of 12/31/11 (from the past 48 hour(s))   BASIC METABOLIC PANEL Status: Abnormal    Collection Time    01/09/12 8:34 AM   Component  Value  Range  Comment    Sodium  136  135 - 145 mEq/L     Potassium  4.4  3.5 - 5.1 mEq/L     Chloride  104  96 - 112 mEq/L     CO2  24  19 - 32 mEq/L     Glucose, Bld  105 (*)  70 - 99 mg/dL     BUN  25 (*)  6 - 23 mg/dL     Creatinine, Ser  1.61 (*)  0.50 - 1.35 mg/dL     Calcium  8.4  8.4 - 10.5 mg/dL     GFR calc non Af Amer  42 (*)  >90 mL/min     GFR calc Af Amer  48 (*)  >90 mL/min    PROTIME-INR Status: Abnormal    Collection Time    01/09/12 8:34 AM   Component  Value  Range  Comment    Prothrombin Time  18.0 (*)  11.6 - 15.2 seconds     INR  1.54 (*)  0.00 - 1.49    BASIC METABOLIC PANEL Status: Abnormal    Collection Time    01/10/12 5:40 AM   Component  Value  Range  Comment    Sodium  137  135 - 145 mEq/L     Potassium  4.7  3.5 - 5.1 mEq/L     Chloride  105  96 - 112 mEq/L     CO2  26  19 - 32 mEq/L     Glucose, Bld  98  70 - 99 mg/dL     BUN  23  6 - 23 mg/dL     Creatinine, Ser  0.96 (*)  0.50 - 1.35 mg/dL     Calcium  8.6  8.4 - 10.5 mg/dL     GFR calc non Af Amer  37 (*)  >90 mL/min     GFR calc Af Amer  43 (*)  >90 mL/min    PROTIME-INR Status: Abnormal    Collection Time    01/10/12 5:40 AM   Component  Value  Range  Comment    Prothrombin Time  22.9 (*)  11.6 - 15.2 seconds     INR  2.13 (*)  0.00 - 1.49    CBC Status: Abnormal    Collection Time    01/10/12 5:40 AM   Component  Value  Range  Comment    WBC  7.9  4.0 - 10.5 K/uL     RBC  4.03 (*)  4.22 - 5.81 MIL/uL     Hemoglobin  11.0 (*)  13.0 - 17.0 g/dL     HCT  04.5 (*)  40.9 - 52.0 %     MCV  84.4  78.0 - 100.0 fL     MCH  27.3  26.0 - 34.0 pg     MCHC  32.4  30.0 - 36.0 g/dL     RDW  81.1 (*)  91.4 - 15.5 %     Platelets  186  150 - 400 K/uL    LIPID PANEL Status: Abnormal    Collection Time    01/10/12 12:34 PM   Component  Value  Range   Comment    Cholesterol  143  0 - 200 mg/dL  Triglycerides  127  <150 mg/dL     HDL  25 (*)  >21 mg/dL     Total CHOL/HDL Ratio  5.7      VLDL  25  0 - 40 mg/dL     LDL Cholesterol  93  0 - 99 mg/dL     No results found.  Post Admission Physician Evaluation:  1. Functional deficits secondary to left AKA. 2. Patient is admitted to receive collaborative, interdisciplinary care between the physiatrist, rehab nursing staff, and therapy team. 3. Patient's level of medical complexity and substantial therapy needs in context of that medical necessity cannot be provided at a lesser intensity of care such as a SNF. 4. Patient has experienced substantial functional loss from his/her baseline which was documented above under the "Functional History" and "Functional Status" headings. Judging by the patient's diagnosis, physical exam, and functional history, the patient has potential for functional progress which will result in measurable gains while on inpatient rehab. These gains will be of substantial and practical use upon discharge in facilitating mobility and self-care at the household level. 5. Physiatrist will provide 24 hour management of medical needs as well as oversight of the therapy plan/treatment and provide guidance as appropriate regarding the interaction of the two. 6. 24 hour rehab nursing will assist with bladder management, bowel management, safety, skin/wound care, disease management, medication administration, pain management and patient education and help integrate therapy concepts, techniques,education, etc. 7. PT will assess and treat for: fxnl mobility, pre-pros ed. Pain mgt, safety, adaptive equipment. Goals are: min to mod assist. 8. OT will assess and treat for: fxnl mobility, ADL's, safety, education, adaptive techniques andequipment. Goals are: min to mod assist. 9. SLP will assess and treat for: n/a. Goals are: n/a. 10. Case Management and Social Worker will assess and treat  for psychological issues and discharge planning. 11. Team conference will be held weekly to assess progress toward goals and to determine barriers to discharge. 12. Patient will receive at least 3 hours of therapy per day at least 5 days per week. 13. ELOS: 2 weeks  Prognosis: excellent and good Medical Problem List and Plan:  1. PE/BLE DVT Prophylaxis/Anticoagulation: Pharmaceutical: Coumadin  2. Pain Management: prn medications effective. Will change baclofen to flexeril as at home.  3. Mood: no signs of distress noted. LCSW to follow for formal evaluation.  4. Neuropsych: This patient is capable of making decisions on his/her own behalf.  5. NSVT: Coreg dose increased today. Monitor for symptoms of recurrence.  6. Acute renal failure: Improving. Recheck lytes in am. Avoid nephrotoxic drugs. Off ACE/NSAIDS.  7. BPH: continue avodart. Denies voiding problems  Ivory Broad, MD

## 2012-01-10 NOTE — Progress Notes (Signed)
I agree with the following treatment note after reviewing documentation.   Johnston, Carine Nordgren Brynn   OTR/L Pager: [phone] Office: [phone] .   

## 2012-01-10 NOTE — Progress Notes (Signed)
Physical Therapy Treatment Patient Details Name: Kevin [name] MRN: [id number] DOB: [birth year] Today's Date: 01/10/2012 Time: 0929-1002 PT Time Calculation (min): 33 min  PT Assessment / Plan / Recommendation Comments on Treatment Session  Pt. continues to need +2(A) when he is out of bed and needs max VC's for using his UE's during mobility and to stand with head facing forward. Nursing in room to take out pt's IV when it began leaking. Pt. presenting with difficulty of how to use the RW to benefit him when standing, may try sit<->stand/transfers without RW. Pt. encouraged to do ex provided in HEP from OT to increase his strength and improve function. Pt. continues to sit without controlled descent.     Follow Up Recommendations  Post acute inpatient     Does the patient have the potential to tolerate intense rehabilitation  Yes, Recommend IP Rehab Screening  Barriers to Discharge        Equipment Recommendations  3 in 1 bedside comode    Recommendations for Other Services Rehab consult  Frequency Min 3X/week   Plan Discharge plan remains appropriate    Precautions / Restrictions Precautions Precautions: Fall Restrictions Weight Bearing Restrictions: No   Pertinent Vitals/Pain No pain stated.     Mobility  Bed Mobility Bed Mobility: Sitting - Scoot to Edge of Bed;Supine to Sit Supine to Sit: HOB elevated;4: Min assist Sitting - Scoot to Edge of Bed: 4: Min assist Details for Bed Mobility Assistance: Pt. needed cues for doing as much of the bed mobility  himself as possible. Pt. was able to pull himself using SPTA and bed rails to a sitting position at EOB with HOB elevated. Pt. able to scoot forward on EOB so R LE coul be on floor. Transfers Transfers: Sit to Stand;Stand to Sit;Stand Pivot Transfers Sit to Stand: With upper extremity assist;1: +2 Total assist;With armrests;From bed;From chair/3-in-1 Sit to Stand: Patient Percentage: 30% Stand to Sit: 1: +2 Total  assist;With upper extremity assist;With armrests;To chair/3-in-1 Stand to Sit: Patient Percentage: 10% Stand Pivot Transfers: 1: +2 Total assist;With armrests Stand Pivot Transfers: Patient Percentage: 30% Details for Transfer Assistance: Pt. needed max cues & (A) for rising from surface using walker. Pt's posture upon standing significantly flexed forward and pt. would attempt to stand upright with VC's. Pt. moved RLE minimally and needed max VC's to try and keep the RLE positioned under him. Ambulation/Gait Ambulation/Gait Assistance: Not tested (comment) Stairs: No Wheelchair Mobility Wheelchair Mobility: No    Exercises General Exercises - Upper Extremity Chair Push Up: AROM;Strengthening;Both;10 reps;Seated    PT Goals Acute Rehab PT Goals PT Goal Formulation: With patient Time For Goal Achievement: 01/20/12 Potential to Achieve Goals: Good Pt will go Supine/Side to Sit: with modified independence PT Goal: Supine/Side to Sit - Progress: Progressing toward goal Pt will go Sit to Supine/Side: with modified independence Pt will go Sit to Stand: with supervision PT Goal: Sit to Stand - Progress: Not met Pt will go Stand to Sit: with supervision PT Goal: Stand to Sit - Progress: Not progressing Pt will Transfer Bed to Chair/Chair to Bed: with supervision PT Transfer Goal: Bed to Chair/Chair to Bed - Progress: Not met  Visit Information  Last PT Received On: 01/10/12 Assistance Needed: +2 PT/OT Co-Evaluation/Treatment: Yes    Subjective Data  Subjective: "Yea, I will get up today" "I needto try and use the bathroom" Patient Stated Goal: Home   Cognition  Overall Cognitive Status: Appears within functional limits for tasks assessed/performed Arousal/Alertness:  Awake/alert Orientation Level: Appears intact for tasks assessed Behavior During Session: [facility] for tasks performed    Balance  Balance Balance Assessed: No  End of Session PT - End of Session Equipment Utilized  During Treatment: Gait belt Activity Tolerance: Patient tolerated treatment well Patient left: in chair;with call bell/phone within reach;Other (comment) (Lift pad underneath ) Nurse Communication: Mobility status;Need for lift equipment     Mertie Clause , SPTA 01/10/2012, 11:31 AM  Verdell Face, PTA [phone] 01/10/2012

## 2012-01-10 NOTE — Progress Notes (Signed)
Patient ID: Kevin [name], male   DOB: 18-Jan-1941, 71 y.o.   MRN: [id number] The patient remains comfortable. 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 The amputation site is healing with no evidence of skin breakdown. Plan per medical service

## 2012-01-10 NOTE — Progress Notes (Signed)
Patient to be admitted to Inpatient Rehab  today. Met with pt to discuss CIR. Pt would benefit from CIR and pt is in agreement with plan. Called pt's son who will be primary caregiver; he is in agreement as well. Called Dr Isidoro Donning who reports pt is medically ready to d/c/ to rehab today. Call for questions [phone]
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
## 2012-01-10 NOTE — Progress Notes (Signed)
ANTICOAGULATION CONSULT NOTE - Follow Up Consult  Pharmacy Consult for Lovenox and Coumadin Indication: pulmonary embolus B/L, R DVT - new  No Known Allergies  Labs:  Basename 01/10/12 0540 01/09/12 0834 01/08/12 0716  HGB 11.0* -- --  HCT 34.0* -- --  PLT 186 -- --  APTT -- -- --  LABPROT 22.9* 18.0* 15.3*  INR 2.13* 1.54* 1.23  HEPARINUNFRC -- -- --  CREATININE 1.77* 1.60* 1.57*  CKTOTAL -- -- --  CKMB -- -- --  TROPONINI -- -- --   Estimated Creatinine Clearance: 43.1 ml/min (by C-G formula based on Cr of 1.77).  Assessment: 71 y.o. male on SQ lovenox/coumadin (Day #4/5 minimum overlap) for large B/L PE and R DVT; IVC filter placed 10/21. Baseline INR 1.37 10/21. No bleeding noted. CBC ok. INR therapeutic today. Noted patient is also on 325mg  ASA daily.   Goal of Therapy:  Monitor platelets by anticoagulation protocol: Yes anti-Xa level = 0.6-1.2 units/ml (4 hrs post dose) INR 2-3   Plan:  Continue Lovenox 100 mg SQ q12h Coumadin 7.5 mg po today  Thank you,  Brett Fairy, PharmD 01/10/2012 12:23 PM

## 2012-01-10 NOTE — Progress Notes (Signed)
[facility]   Postoperative Visit - Amputation  Date of Surgery: 12/31/2011 - 01/05/2012 Procedure(s): AMPUTATION ABOVE KNEE Left Surgeon: Surgeon(s): Larina Earthly, MD POD: 5 Days Post-Op  Subjective Kevin [name] is a 71 y.o. male who is S/P Left Procedure(s): AMPUTATION ABOVE KNEE.  Pt.denies increased pain in the stump. The patient notes pain is well controlled. Pt. denies phantom pain.  Significant Diagnostic Studies: CBC Lab Results  Component Value Date   WBC 7.9 01/10/2012   HGB 11.0* 01/10/2012   HCT 34.0* 01/10/2012   MCV 84.4 01/10/2012   PLT 186 01/10/2012    BMET    Component Value Date/Time   NA 137 01/10/2012 0540   K 4.7 01/10/2012 0540   CL 105 01/10/2012 0540   CO2 26 01/10/2012 0540   GLUCOSE 98 01/10/2012 0540   BUN 23 01/10/2012 0540   CREATININE 1.77* 01/10/2012 0540   CALCIUM 8.6 01/10/2012 0540   GFRNONAA 37* 01/10/2012 0540   GFRAA 43* 01/10/2012 0540    COAG Lab Results  Component Value Date   INR 2.13* 01/10/2012   INR 1.54* 01/09/2012   INR 1.23 01/08/2012   No results found for this basename: PTT     Intake/Output Summary (Last 24 hours) at 01/10/12 0738 Last data filed at 01/10/12 0700  Gross per 24 hour  Intake 1831.25 ml  Output   1904 ml  Net -72.75 ml   No data found.    Physical Examination  BP Readings from Last 3 Encounters:  01/10/12 112/80  01/10/12 112/80  01/10/12 112/80   Temp Readings from Last 3 Encounters:  01/10/12 98 F (36.7 C) Oral  01/10/12 98 F (36.7 C) Oral  01/10/12 98 F (36.7 C) Oral   SpO2 Readings from Last 3 Encounters:  01/10/12 97%  01/10/12 97%  01/10/12 97%   Pulse Readings from Last 3 Encounters:  01/10/12 60  01/10/12 60  01/10/12 60    Pt is A&Ox3  WDWN male with no complaints  Left amputation wound is clean and healing well with min. drainage.  There is good bone coverage in the stump Stump is warm and well perfused, with min.  drainage; without erythema   Assessment/plan:  Kevin [name] is a 71 y.o. male who is s/p Left Procedure(s): AMPUTATION ABOVE KNEE  The patient's stump is clean, dry, intact or viable.  Follow-up 4 weeks from surgery  Clinton Gallant [facility] 7:38 AM 01/10/[phone]

## 2012-01-10 NOTE — Progress Notes (Signed)
Orthopedic Tech Progress Note Patient Details:  Kevin [name] 1940-10-23 [id number]  Patient ID: [name], male   DOB: 11-10-1940, 71 y.o.   MRN: [id number]   [name] 01/10/2012, 2:34 PM CALLED BIO-TECH LEFT RETENTION SOCK FOR AKA

## 2012-01-10 NOTE — Progress Notes (Signed)
Pt admitted to room 4032, Pt oriented to rehab unit, rehab schedule, safety bell and room. Pt resting in bed with call bell in reach

## 2012-01-11 ENCOUNTER — Inpatient Hospital Stay (HOSPITAL_COMMUNITY): Payer: Medicare Other | Admitting: Physical Therapy

## 2012-01-11 ENCOUNTER — Inpatient Hospital Stay (HOSPITAL_COMMUNITY): Payer: Medicare Other

## 2012-01-11 ENCOUNTER — Inpatient Hospital Stay (HOSPITAL_COMMUNITY): Payer: Medicare Other | Admitting: Occupational Therapy

## 2012-01-11 DIAGNOSIS — I70269 Atherosclerosis of native arteries of extremities with gangrene, unspecified extremity: Secondary | ICD-10-CM

## 2012-01-11 DIAGNOSIS — N19 Unspecified kidney failure: Secondary | ICD-10-CM

## 2012-01-11 DIAGNOSIS — Z89619 Acquired absence of unspecified leg above knee: Secondary | ICD-10-CM

## 2012-01-11 DIAGNOSIS — I82419 Acute embolism and thrombosis of unspecified femoral vein: Secondary | ICD-10-CM | POA: Insufficient documentation

## 2012-01-11 DIAGNOSIS — I2699 Other pulmonary embolism without acute cor pulmonale: Secondary | ICD-10-CM

## 2012-01-11 DIAGNOSIS — S78119A Complete traumatic amputation at level between unspecified hip and knee, initial encounter: Secondary | ICD-10-CM

## 2012-01-11 DIAGNOSIS — I509 Heart failure, unspecified: Secondary | ICD-10-CM

## 2012-01-11 DIAGNOSIS — N179 Acute kidney failure, unspecified: Secondary | ICD-10-CM

## 2012-01-11 DIAGNOSIS — D72829 Elevated white blood cell count, unspecified: Secondary | ICD-10-CM

## 2012-01-11 LAB — COMPREHENSIVE METABOLIC PANEL
Albumin: 2.2 g/dL — ABNORMAL LOW (ref 3.5–5.2)
Alkaline Phosphatase: 60 U/L (ref 39–117)
BUN: 24 mg/dL — ABNORMAL HIGH (ref 6–23)
CO2: 24 mEq/L (ref 19–32)
Chloride: 104 mEq/L (ref 96–112)
Creatinine, Ser: 1.68 mg/dL — ABNORMAL HIGH (ref 0.50–1.35)
GFR calc non Af Amer: 39 mL/min — ABNORMAL LOW (ref >90)
Potassium: 4.6 mEq/L (ref 3.5–5.1)
Total Bilirubin: 0.4 mg/dL (ref 0.3–1.2)

## 2012-01-11 LAB — CBC WITH DIFFERENTIAL/PLATELET
Basophils Relative: 0 % (ref 0–1)
HCT: 33.3 % — ABNORMAL LOW (ref 39.0–52.0)
Hemoglobin: 10.6 g/dL — ABNORMAL LOW (ref 13.0–17.0)
Lymphocytes Relative: 22 % (ref 12–46)
Lymphs Abs: 1.7 10*3/uL (ref 0.7–4.0)
Monocytes Absolute: 0.7 10*3/uL (ref 0.1–1.0)
Monocytes Relative: 8 % (ref 3–12)
Neutro Abs: 5.5 10*3/uL (ref 1.7–7.7)
Neutrophils Relative %: 69 % (ref 43–77)
RBC: 3.98 MIL/uL — ABNORMAL LOW (ref 4.22–5.81)
WBC: 7.9 10*3/uL (ref 4.0–10.5)

## 2012-01-11 LAB — PROTIME-INR: Prothrombin Time: 25.5 seconds — ABNORMAL HIGH (ref 11.6–15.2)

## 2012-01-11 MED ORDER — WARFARIN SODIUM 7.5 MG PO TABS
7.5000 mg | ORAL_TABLET | Freq: Once | ORAL | Status: AC
  Administered 2012-01-11: 7.5 mg via ORAL
  Filled 2012-01-11: qty 1

## 2012-01-11 NOTE — Progress Notes (Signed)
ANTICOAGULATION CONSULT NOTE - Follow Up Consult  Pharmacy Consult: Lovenox / Coumadin Indication:  B/L PE + right DVT  No Known Allergies  Patient Measurements: Height: 5\' 8"  (172.7 cm) Weight: 212 lb 1.3 oz (96.2 kg) IBW/kg (Calculated) : 68.4   Vital Signs: Temp: 97.9 F (36.6 C) (10/29 0500) Temp src: Oral (10/29 0500) Pulse Rate: 62  (10/29 0500)  Labs:  Basename 01/11/12 0640 01/10/12 0540 01/09/12 0834  HGB 10.6* 11.0* --  HCT 33.3* 34.0* --  PLT 197 186 --  APTT -- -- --  LABPROT 25.5* 22.9* 18.0*  INR 2.46* 2.13* 1.54*  HEPARINUNFRC -- -- --  CREATININE 1.68* 1.77* 1.60*  CKTOTAL -- -- --  CKMB -- -- --  TROPONINI -- -- --    Estimated Creatinine Clearance: 44.7 ml/min (by C-G formula based on Cr of 1.68).     Assessment: 15 YOM with B/L PE and right DVT to continue on Coumadin.  Today is day #5/5 overlap of Lovenox and Coumadin.  INR therapeutic x 2; no bleeding reported.   Goal of Therapy:  INR 2-3 Anti-Xa level = 0.6-1.2 units/ml (4 hrs post dose) Monitor platelets by anticoagulation protocol: Yes    Plan:  - Coumadin 7.5mg  PO today - D/C Lovenox post today's doses - Daily PT / INR     Jameriah Trotti D. Laney Potash, PharmD, BCPS Pager:  [phone] 01/11/2012, 10:32 AM

## 2012-01-11 NOTE — Progress Notes (Signed)
Occupational Therapy Note  Patient Details  Name: Kevin [name] MRN: [id number] Date of Birth: 1941-02-19 Today's Date: 01/11/2012  Time: 1300-1330 Pt denies pain Individual therapy Focus on w/c mobility, lateral leans in w/c, discharge planning, pressure relief while in w/c, and ADL education.  Pt states that he has walk-in shower and has not used shower in a few months secondary to unable to safely stand up.  Pt states he has fallen twice prior to this admission.  Pt states his son and nephew live with him and will be able to provide supervision/assistance after discharge.   Lavone Neri [facility] 01/11/2012, 2:15 PM

## 2012-01-11 NOTE — Evaluation (Signed)
Occupational Therapy Assessment and Plan  Patient Details  Name: Kevin [name] MRN: [id number] Date of Birth: November 14, 1940  OT Diagnosis: acute pain and muscle weakness (generalized) Rehab Potential: Rehab Potential: Good ELOS: 12-14 days   Today's Date: 01/11/2012 Time: 1000-1100 Time Calculation (min): 60 min  Problem List:  Patient Active Problem List  Diagnosis  . Ischemic leg  . HTN (hypertension)  . CAD (coronary artery disease)  . CHF (congestive heart failure)  . Tobacco abuse  . Hyperlipidemia  . Severe sepsis(995.92)  . Septic shock(785.52)  . Hypotension  . Right ventricular failure  . Renal mass, right  . DVT of axillary vein, acute  . Acute pulmonary embolism  . Unilateral complete BKA-left  . Renal failure  . Leucocytosis  . DVT, femoral, acute    Past Medical History:  Past Medical History  Diagnosis Date  . Arthritis   . Hypertension   . CHF (congestive heart failure)     a. NICM/acute CHF 2007 with EF 10-20%. b. Improved EF 50-55% by echo 12/2011.  Marland Kitchen Hyperlipidemia   . Coronary artery disease     a. NSTEMI 2007 with cath showing mild nonobstructive CAD by cath 2007 (40% mLAD, 40%mRCA) at time of dx of NICM.  Marland Kitchen Tortuous aorta     a. Noted by cath 2007, recommendation for OP [place].  Marland Kitchen BPH (benign prostatic hyperplasia)   . Wide-complex tachycardia     a. Noted 2007.  . Obesity   . Valvular heart disease     a. Mod MR by echo 06/2005 but no significant MR 2013.   Past Surgical History:  Past Surgical History  Procedure Date  . No past surgeries   . Vena cava filter placement 01/03/2012    Procedure: INSERTION VENA-CAVA FILTER;  Surgeon: Larina Earthly, MD;  Location: [facility] OR;  Service: Vascular;  Laterality: Left;  . Amputation 01/05/2012    Procedure: AMPUTATION ABOVE KNEE;  Surgeon: Larina Earthly, MD;  Location: [facility] OR;  Service: Vascular;  Laterality: Left;    Assessment & Plan Clinical Impression: 71 y.o. male with history of CAD, LLE ulcers,  admitted on 01/01/12 with pain, cellulitis and ischemia. Started on IV antibiotics and amputation recommended by Dr. Darrick Penna. Patient with leucocytosis and renal failure. Patient developed hypotension due to septic shock on 10/20. Dr Eliott Nine consulted and felt AKI due to multifactorial and fluid resuscitation with current care. 2D echo revealed severely dilated RV with EF 50-55%. Cardiology recommended and expressed concerns of PE and V/Q scan recommended. V/Q scan done revealing defects in RLL and L lobe. LE doppler positive for DVT throughout RLE. IVC filter placed 10/21 and patient underwent L-AKA 10/23 by Dr. Arbie Cookey. Renal failure resolved.  Retention sock ordered by [employer].  Patient transferred to CIR on 01/10/2012 .    Patient currently requires mod assist - supervision with basic self-care skills secondary to muscle weakness and decreased sitting balance, decreased standing balance and decreased balance strategies.  Prior to hospitalization, patient used w/c as primary mode of transportation, is retired and does not drive.  Patient reports he has not gotten into the shower for a number of weeks and has resorted to sponge bathes.  Patient required assistance with all IADLs PTA.  Patient will benefit from skilled intervention to increase independence with basic self-care skills prior to discharge home with care partner.  Anticipate patient will require 24 hour supervision and minimal physical assistance and follow up home health.  OT - End of Session  Activity Tolerance: Tolerates 30+ min activity with multiple rests Endurance Deficit: Yes OT Assessment Rehab Potential: Good Barriers to Discharge: None OT Plan OT Frequency: 1-2 X/day, 60-90 minutes Estimated Length of Stay: 12-14 days OT Treatment/Interventions: Balance/vestibular training;Cognitive remediation/compensation;Community reintegration;Discharge planning;Disease mangement/prevention;DME/adaptive equipment instruction;Functional mobility  training;Pain management;Patient/family education;Psychosocial support;Self Care/advanced ADL retraining;Therapeutic Activities;Therapeutic Exercise;UE/LE Strength taining/ROM;Wheelchair propulsion/positioning OT Recommendation Follow Up Recommendations: Home health OT Equipment Recommended:  (has 3 in 1 yet may need drop arm commode)  OT Evaluation Precautions/Restrictions  Precautions Precautions: Fall Restrictions Weight Bearing Restrictions: No Pain Pain Assessment Pain Score:   7 Pain Type: Acute pain Pain Location: Leg Pain Orientation: Left Pain Descriptors: Aching;Cramping Pain Onset: On-going Pain Intervention(s): Repositioned;RN made aware Home Living/Prior Functioning Home Living Lives With: Family;Son (son and nephew) Available Help at Discharge: Family;Available 24 hours/day Type of Home: Apartment Home Access: Stairs to enter (plan for ramp) Entrance Stairs-Number of Steps: 4 Entrance Stairs-Rails: Can reach both;Left;Right Home Layout: One level Bathroom Shower/Tub: Walk-in Contractor: Handicapped height Bathroom Accessibility: Yes How Accessible: Accessible via wheelchair;Accessible via walker Home Adaptive Equipment: Grab bars around toilet;Walker - rolling;Shower chair without back;Wheelchair - manual;Bedside commode/3-in-1 Additional Comments: Pt reports that owner will put in a ramp, install HH shower hose, and grab bars in shower Prior Function Level of Independence:  (was only taking about 3 steps (like in kitchen)) Meal Prep: Total Light Housekeeping: Total Able to Take Stairs?: Yes (with assist from nephew and son) Driving: No Vocation: Retired Leisure: Hobbies-yes (Comment) (likes to watch wrestling with grand nephew) Comments: Pt could do steps PTA with assist from son, mostly using wheelchair.  Cognition Orientation Level: Oriented X4 Memory: Appears intact Awareness: Appears intact Problem Solving: Impaired Problem  Solving Impairment: Functional complex Comments: Slow processing Sensation Sensation Light Touch: Appears Intact Stereognosis: Appears Intact Hot/Cold: Appears Intact Proprioception: Appears Intact Coordination Gross Motor Movements are Fluid and Coordinated: Yes Fine Motor Movements are Fluid and Coordinated: Yes Mobility  Bed Mobility Bed Mobility: Sit to Supine Supine to Sit: 3: Mod assist;HOB flat (no rails) Supine to Sit Details (indicate cue type and reason): Cues for technique and efficiency. Pt with difficulty secondary to shift in center of mass.  Sit to Supine: 4: Min assist Sit to Supine - Details (indicate cue type and reason): Assist for Rt. LE. Transfers Min assist with slide board w/c to bed Sit to Stand: 1: +2 Total assist;From chair/3-in-1 Sit to Stand Details (indicate cue type and reason): Repeated cues for safe UE placement (attempts to pull on RW), impaired transition of bil. UEs from wheelchair armrests to RW. Delayed hip/knee extension.  Stand to Sit: 1: +2 Total assist Stand to Sit Details: Decreased control of descent, cues needed for reaching for armrests of chair.   Balance Static Standing Balance Static Standing - Balance Support: Bilateral upper extremity supported Static Standing - Level of Assistance: 1: +2 Total assist Static Standing - Comment/# of Minutes: Facilitation of glutes for improved hip extension, verbal cues for knee and hip extension. Verbal cues for improved upright posture.  Extremity/Trunk Assessment RUE Assessment RUE Assessment: Within Functional Limits LUE Assessment LUE Assessment: Within Functional Limits  See FIM for current functional status Refer to Care Plan for Long Term Goals  Recommendations for other services: None  Discharge Criteria: Patient will be discharged from OT if patient refuses treatment 3 consecutive times without medical reason, if treatment goals not met, if there is a change in medical status, if  patient makes no progress towards goals  or if patient is discharged from hospital.  The above assessment, treatment plan, treatment alternatives and goals were discussed and mutually agreed upon: by patient  Ebelin Dillehay 01/11/2012, 3:37 PM

## 2012-01-11 NOTE — Evaluation (Signed)
Physical Therapy Assessment and Plan  Patient Details  Name: Kevin [name] MRN: [id number] Date of Birth: 10/28/1940  PT Diagnosis: Abnormal posture, Abnormality of gait, Cognitive deficits, Difficulty walking, Impaired cognition and Muscle weakness Rehab Potential: Good ELOS: 12-14 days   Today's Date: 01/11/2012 Time: 4098-1191 Time Calculation (min): 65 min  Problem List:  Patient Active Problem List  Diagnosis  . Ischemic leg  . HTN (hypertension)  . CAD (coronary artery disease)  . CHF (congestive heart failure)  . Tobacco abuse  . Hyperlipidemia  . Severe sepsis(995.92)  . Septic shock(785.52)  . Hypotension  . Right ventricular failure  . Renal mass, right  . DVT of axillary vein, acute  . Acute pulmonary embolism  . Unilateral complete BKA-left  . Renal failure  . Leucocytosis  . DVT, femoral, acute    Past Medical History:  Past Medical History  Diagnosis Date  . Arthritis   . Hypertension   . CHF (congestive heart failure)     a. NICM/acute CHF 2007 with EF 10-20%. b. Improved EF 50-55% by echo 12/2011.  Marland Kitchen Hyperlipidemia   . Coronary artery disease     a. NSTEMI 2007 with cath showing mild nonobstructive CAD by cath 2007 (40% mLAD, 40%mRCA) at time of dx of NICM.  Marland Kitchen Tortuous aorta     a. Noted by cath 2007, recommendation for OP [place].  Marland Kitchen BPH (benign prostatic hyperplasia)   . Wide-complex tachycardia     a. Noted 2007.  . Obesity   . Valvular heart disease     a. Mod MR by echo 06/2005 but no significant MR 2013.   Past Surgical History:  Past Surgical History  Procedure Date  . No past surgeries   . Vena cava filter placement 01/03/2012    Procedure: INSERTION VENA-CAVA FILTER;  Surgeon: Larina Earthly, MD;  Location: [facility] OR;  Service: Vascular;  Laterality: Left;  . Amputation 01/05/2012    Procedure: AMPUTATION ABOVE KNEE;  Surgeon: Larina Earthly, MD;  Location: [facility] OR;  Service: Vascular;  Laterality: Left;    Assessment & Plan Clinical  Impression: Kevin [name] is a 71 y.o. male with history of CAD, LLE ulcers, admitted on 01/01/12 with pain, cellulitis and ischemia. Started on IV antibiotics and amputation recommended by Dr. Darrick Penna. Patient with leucocytosis and renal failure. Patient developed hypotension due to septic shock on 10/20. Dr Eliott Nine consulted and felt AKI due to multifactorial due to hypotension, sepsis, as well as ace/Nsaids and eecommended continuing fluid resuscitation. 2D echo revealed severely dilated RV with EF 50-55%. Cardiology recommended and expressed concerns of PE and V/Q scan recommended. Patient with elevated cardiac enzymes secondary to NSTEMI due to sepsis. V/Q scan done revealing defects in RLL and L lobe. LE doppler positive for DVT throughout RLE. IVC filter placed 10/21 and patient underwent L-AKA 10/23 by Dr. Arbie Cookey. Renal failure resolved. Started on coumadin for treatment of PE/DVT. Patient was noted to have episode of NSVT today and cardiology felt this was multifactorial. Recommendations for patient to have nuclear stress study on outpatient basis and coreg dose increased. Therapies initiated and working on standing and posture. Therapy team recommending CIR for progression. Patient transferred to CIR on 01/10/2012 .   Patient currently requires mod assist for transfer, total assist +2 in standing secondary to muscle weakness and muscle joint tightness, decreased cardiorespiratoy endurance, impaired timing and sequencing and unbalanced muscle activation, decreased problem solving and delayed processing and decreased sitting balance, decreased standing balance and  decreased balance strategies. Pt able to perform scoot pivot transfer today with moderate assist, max verbal cues for sequencing and weight shift. Sliding board in room if needed. Will benefit from sitting/standing balance, increased independence and safety with transfers, and improved endurance. Prior to hospitalization, patient was min with  ambulation, modified independent with transfers and lived with Tinnie Gens (son and nephew) in a Apartment home.  Home access is 4Stairs to enter (plan for ramp).  Patient will benefit from skilled PT intervention to maximize safe functional mobility, minimize fall risk and decrease caregiver burden for planned discharge home with 24 hour supervision.  Anticipate patient will benefit from follow up HH at discharge.  PT - End of Session Endurance Deficit: Yes PT Assessment Rehab Potential: Good Barriers to Discharge: None PT Plan PT Frequency: 2-3 X/day, 60-90 minutes Estimated Length of Stay: 12-14 days PT Treatment/Interventions: Ambulation/gait training;Discharge planning;Balance/vestibular training;DME/adaptive equipment instruction;Neuromuscular re-education;Functional mobility training;Pain management;Patient/family education;Psychosocial support;Skin care/wound management;Splinting/orthotics;Therapeutic Activities;Therapeutic Exercise;UE/LE Strength taining/ROM;UE/LE Coordination activities;Wheelchair propulsion/positioning PT Recommendation Follow Up Recommendations: Home health PT;24 hour supervision/assistance Equipment Recommended: None recommended by PT (at this time)  PT Evaluation Precautions/Restrictions Precautions Precautions: Fall Restrictions Weight Bearing Restrictions: No  Vital Signs Therapy Vitals Temp: 97.9 F (36.6 C) Temp src: Oral Pulse Rate: 62  Resp: 17  Patient Position, if appropriate: Lying Oxygen Therapy SpO2: 97 % O2 Device: None (Room air) Pain Pain Assessment Pain Score:   7 Pain Type: Acute pain Pain Location: Leg Pain Orientation: Left Pain Descriptors: Aching;Cramping Pain Onset: On-going Pain Intervention(s): Repositioned;RN made aware Home Living/Prior Functioning Home Living Lives With: Family;Son (son and nephew) Available Help at Discharge: Family;Available 24 hours/day Type of Home: Apartment Home Access: Stairs to enter  (plan for ramp) Entrance Stairs-Number of Steps: 4 Entrance Stairs-Rails: Can reach both;Left;Right Home Layout: One level Bathroom Shower/Tub: Health visitor: Handicapped height Bathroom Accessibility: Yes How Accessible: Accessible via wheelchair;Accessible via walker Home Adaptive Equipment: Grab bars around toilet;Grab bars in shower;Hand-held shower hose;Shower chair with back;Walker - rolling;Wheelchair - manual;Bedside commode/3-in-1 (bedside commode is tall) Additional Comments: Pt reports building owner putting in a ramp Prior Function Level of Independence:  (was only taking about 3 steps (like in kitchen)) Meal Prep: Total Light Housekeeping: Total Able to Take Stairs?: Yes (with assist from nephew and son) Driving: No Vocation: Retired Leisure: Hobbies-yes (Comment) (likes to watch wrestling with grand nephew) Comments: Pt could do steps PTA with assist from son, mostly using wheelchair.   Cognition Orientation Level: Oriented X4 Memory: Appears intact Awareness: Appears intact Problem Solving: Impaired Problem Solving Impairment: Functional complex Comments: Slow processing Sensation Sensation Light Touch: Appears Intact (Rt. LE and Lt. residual limb per pt report) Coordination Gross Motor Movements are Fluid and Coordinated: Yes Fine Motor Movements are Fluid and Coordinated: Yes  Mobility Bed Mobility Bed Mobility: Sit to Supine Supine to Sit: 3: Mod assist;HOB flat (no rails) Supine to Sit Details (indicate cue type and reason): Cues for technique and efficiency. Pt with difficulty secondary to shift in center of mass.  Sit to Supine: 4: Min assist Sit to Supine - Details (indicate cue type and reason): Assist for Rt. LE. Transfers Sit to Stand: 1: +2 Total assist;From chair/3-in-1 Sit to Stand Details (indicate cue type and reason): Repeated cues for safe UE placement (attempts to pull on RW), impaired transition of bil. UEs from wheelchair  armrests to RW. Delayed hip/knee extension.  Stand to Sit: 1: +2 Total assist Stand to Sit Details: Decreased control of descent, cues  needed for reaching for armrests of chair.  Locomotion  Ambulation Ambulation: No (Attempted 1 hop, pt unable even with total assist) Ambulation/Gait Assistance: 1: +2 Total assist Gait Gait: No (unsafe to perform)   Balance Static Standing Balance Static Standing - Balance Support: Bilateral upper extremity supported Static Standing - Level of Assistance: 1: +2 Total assist Static Standing - Comment/# of Minutes: Facilitation of glutes for improved hip extension, verbal cues for knee and hip extension. Verbal cues for improved upright posture.  Extremity Assessment      RLE Assessment RLE Assessment: Exceptions to [facility] RLE Strength RLE Overall Strength Comments: Funtional weakness evident Right Hip Flexion: 3+/5 Right Knee Flexion: 3/5 Right Knee Extension: 3+/5 Right Ankle Dorsiflexion: 3+/5 LLE Assessment LLE Assessment: Exceptions to WFL LLE AROM (degrees) LLE Overall AROM Comments: Decreased Lt. hip extension LLE Strength LLE Overall Strength Comments: 3+/5 Lt. hip flexion, 3-/5 hip extension   Skilled Therapeutic Interventions/Progress Updates:  Had long discussion about positioning of Lt. LE to prevent contractures, discussed importance of hip extension and glute strength for future prosthesis use. Performed standing activity working on improved hip/knee extension and upright posture. Pt only able to tolerate standing seconds at a time. Discussed ramp building, pt will need ramp prior to return home, pt reports all supplies are at his apartment they just need to be built.   See FIM for current functional status Refer to Care Plan for Long Term Goals  Recommendations for other services: None  Discharge Criteria: Patient will be discharged from PT if patient refuses treatment 3 consecutive times without medical reason, if treatment goals  not met, if there is a change in medical status, if patient makes no progress towards goals or if patient is discharged from hospital.  The above assessment, treatment plan, treatment alternatives and goals were discussed and mutually agreed upon: by patient  Wilhemina Bonito 01/11/2012, 8:50 AM

## 2012-01-11 NOTE — Care Management Note (Signed)
[facility]patient Rehabilitation [facility] Individual Statement of Services  Patient Name:  Kevin [name]  Date:  01/11/2012  Welcome to the Inpatient Rehabilitation Center.  Our goal is to provide you with an individualized program based on your diagnosis and situation, designed to meet your specific needs.  With this comprehensive rehabilitation program, you will be expected to participate in at least 3 hours of rehabilitation therapies Monday-Friday, with modified therapy programming on the weekends.  Your rehabilitation program will include the following services:  Physical Therapy (PT), Occupational Therapy (OT), 24 hour per day rehabilitation nursing, Therapeutic Recreaction (TR), Case Management (RN and Child psychotherapist), Rehabilitation Medicine, Nutrition Services and Pharmacy Services  Weekly team conferences will be held on Tuesday  to discuss your progress.  Your RN Case Designer, television/film set will talk with you frequently to get your input and to update you on team discussions.  Team conferences with you and your family in attendance may also be held.  Expected length of stay: 14-21 days Overall anticipated outcome: mod/i-transfers mod assist-ambulation  Depending on your progress and recovery, your program may change.  Your RN Case Estate agent will coordinate services and will keep you informed of any changes.  Your RN Sports coach and SW names and contact numbers are listed  below.  The following services may also be recommended but are not provided by the Inpatient Rehabilitation Center:   Driving Evaluations  Home Health Rehabiltiation Services  Outpatient Rehabilitatation [facility]  Vocational Rehabilitation   Arrangements will be made to provide these services after discharge if needed.  Arrangements include referral to agencies that provide these services.  Your insurance has been verified to be:  Medicare & medicaid Your primary doctor is:  Dr Jeri Cos  Pertinent information will be shared with your doctor and your insurance company.   Social Worker:  Dossie Der, [place] [phone]  Information discussed with and copy given to patient by: Lucy Chris, 01/11/2012, 8:29 AM

## 2012-01-11 NOTE — Progress Notes (Signed)
Patient information reviewed and entered into eRehab system by Tallia Moehring, RN, CRRN, PPS Coordinator.  Information including medical coding and functional independence measure will be reviewed and updated through discharge.     Per nursing patient was given "Data Collection Information Summary for Patients in Inpatient Rehabilitation Facilities with attached "Privacy Act Statement-Health Care Records" upon admission.  

## 2012-01-11 NOTE — Progress Notes (Signed)
Social Work Assessment and Plan Social Work Assessment and Plan  Patient Details  Name: Kevin [name] MRN: [id number] Date of Birth: May 28, 1940  Today's Date: 01/11/2012  Problem List:  Patient Active Problem List  Diagnosis  . Ischemic leg  . HTN (hypertension)  . CAD (coronary artery disease)  . CHF (congestive heart failure)  . Tobacco abuse  . Hyperlipidemia  . Severe sepsis(995.92)  . Septic shock(785.52)  . Hypotension  . Right ventricular failure  . Renal mass, right  . DVT of axillary vein, acute  . Acute pulmonary embolism  . Unilateral complete BKA-left  . Renal failure  . Leucocytosis  . DVT, femoral, acute   Past Medical History:  Past Medical History  Diagnosis Date  . Arthritis   . Hypertension   . CHF (congestive heart failure)     a. NICM/acute CHF 2007 with EF 10-20%. b. Improved EF 50-55% by echo 12/2011.  Marland Kitchen Hyperlipidemia   . Coronary artery disease     a. NSTEMI 2007 with cath showing mild nonobstructive CAD by cath 2007 (40% mLAD, 40%mRCA) at time of dx of NICM.  Marland Kitchen Tortuous aorta     a. Noted by cath 2007, recommendation for OP [place].  Marland Kitchen BPH (benign prostatic hyperplasia)   . Wide-complex tachycardia     a. Noted 2007.  . Obesity   . Valvular heart disease     a. Mod MR by echo 06/2005 but no significant MR 2013.   Past Surgical History:  Past Surgical History  Procedure Date  . No past surgeries   . Vena cava filter placement 01/03/2012    Procedure: INSERTION VENA-CAVA FILTER;  Surgeon: Larina Earthly, MD;  Location: [facility] OR;  Service: Vascular;  Laterality: Left;  . Amputation 01/05/2012    Procedure: AMPUTATION ABOVE KNEE;  Surgeon: Larina Earthly, MD;  Location: [facility] OR;  Service: Vascular;  Laterality: Left;   Social History:  reports that he has quit smoking. He uses smokeless tobacco. He reports that he does not drink alcohol or use illicit drugs.  Family / Support Systems Marital Status: Separated Patient Roles: Parent Children:  Hadley Pen  [phone]  [phone] Other Supports: Joesphine Bare  [phone]  [phone] Anticipated Caregiver: Son and nephew whom he lives with Ability/Limitations of Caregiver: none Caregiver Availability: 24/7 Family Dynamics: Close knit family he also has a daughter here-Debbie.  His neighbors are supportive.  He feels he has very good support and is confident he will be well taken car eof at home.  Social History Preferred language: English Religion: Baptist Cultural Background: No issues Education: [employer] Read: Yes Write: Yes Employment Status: Retired Fish farm manager Issues: No issues Guardian/Conservator: None-according to MD pt is capable of making his own decisions   Abuse/Neglect Physical Abuse: Denies Verbal Abuse: Denies Sexual Abuse: Denies Exploitation of patient/patient's resources: Denies Self-Neglect: Denies  Emotional Status Pt's affect, behavior adn adjustment status: Pt has a strong faith and relies upon his faith to pull him through difficult situations.  He is motivated and glad the surgery is over and he is feeling better.  His goal is to be able to transfer himself. Recent Psychosocial Issues: Other medical issues-separated from his wife but they are still on good terms.  She has visited here in the hosptial. Pyschiatric History: No history-depression screen score-2.  He is optimisitic he will do well here and feels blessed to be doing this well. Substance Abuse History: Smokes changed to e-cigarettes for 2 weeks prior to  admission.  Does not drink  Patient / Family Perceptions, Expectations & Goals Pt/Family understanding of illness & functional limitations: Pt is able to explain his surgery and medical issues.  He reprots he was very sick and it was one thing after another.  He is glad to be doing better and feeling better. Premorbid pt/family roles/activities: Father, retiree, Network engineer, grandfather, etc Anticipated changes in  roles/activities/participation: Resume Pt/family expectations/goals: Pt states: " I want to be able to do as mcuh for myself as I can, atleast transfer myself."  He is motivated to work hard and do well here.  Community Resources [employer]: None Premorbid Home Care/DME Agencies: None Transportation available at discharge: [employer] referrals recommended: Support group (specify) (Amputee Support group)  Discharge Planning Living Arrangements: Children;Other relatives Support Systems: Children;Other relatives;Friends/neighbors;Church/faith community Type of Residence: Private residence Insurance Resources: Medicare;Medicaid (specify county) Medical sales representative) Financial Resources: SSD Financial Screen Referred: No Living Expenses: Rent Money Management: Patient;Family Do you have any problems obtaining your medications?: No Home Management: Son and nephew Patient/Family Preliminary Plans: Return home with son and nephew.  Daughter to check on also.  He reprots he will always have someone there with him.  he is used to using a wheelchair he did prior to [employer]. Social Work Anticipated Follow Up Needs: HH/OP;Support Group  Clinical Impression Pleasant bright gentleman who is motivated and ready to work in therapies.  He has good family support and will have 24 hr care at discharge.  Lucy Chris 01/11/2012, 9:01 AM

## 2012-01-11 NOTE — Progress Notes (Signed)
Physical Therapy Session Note  Patient Details  Name: Kevin [name] MRN: [id number] Date of Birth: Apr 09, 1940  Today's Date: 01/11/2012 Time: 1520-1603 Time Calculation (min): 43 min  Short Term Goals: Week 1:  PT Short Term Goal 1 (Week 1): Pt will perform bed mobility with min assist PT Short Term Goal 2 (Week 1): Pt will perform bed <> chair transfer with min assist PT Short Term Goal 3 (Week 1): Pt will perform sit <> stand with max assist PT Short Term Goal 4 (Week 1): Pt will have HEP started and report compliance with performance.   Skilled Therapeutic Interventions/Progress Updates:    Increased fatigue this afternoon, mod progressing to max assist for scoot transfer recliner to slightly higher wheelchair surface. Pt will likely be most appropriate for sliding board particularly when tired. Pt able to verbalize appropriate positioning of wheelchair and use of brakes prior to transfer. 160' wheelchair propulsion for strength and conditioning. Wheelchair push-ups with Rt. LE on ground 3 x 10 reps, cues for scapular depression for shoulder preservation. Pt instructed on pressure relief and use of lateral leans or wheelchair pushups every 45 min to every hour when sitting in wheelchair. Pt verbalizes understanding.   Rt. Resisted Long arc quads with 4# weight; bil. UE rows with 4# weight 3 x 10 reps each.   Therapy Documentation Precautions:  Precautions Precautions: Fall Restrictions Weight Bearing Restrictions:No Pain:  7/10 Lt. LE, had already called for medication  See FIM for current functional status  Therapy/Group: Individual Therapy  Wilhemina Bonito 01/11/2012, 5:56 PM

## 2012-01-11 NOTE — Progress Notes (Signed)
Subjective/Complaints: No complaints. Pain under reasonable control. Was able to sleep.  A 12 point review of systems has been performed and if not noted above is otherwise negative.   Objective: Vital Signs: Blood pressure 133/91, pulse 62, temperature 97.9 F (36.6 C), temperature source Oral, resp. rate 17, height 5\' 8"  (1.727 m), weight 96.2 kg (212 lb 1.3 oz), SpO2 97.00%. No results found.  Basename 01/11/12 0640 01/10/12 0540  WBC 7.9 7.9  HGB 10.6* 11.0*  HCT 33.3* 34.0*  PLT 197 186    Basename 01/11/12 0640 01/10/12 0540  NA 137 137  K 4.6 4.7  CL 104 105  CO2 24 26  GLUCOSE 103* 98  BUN 24* 23  CREATININE 1.68* 1.77*  CALCIUM 8.4 8.6   CBG (last 3)  No results found for this basename: GLUCAP:3 in the last 72 hours  Wt Readings from Last 3 Encounters:  01/10/12 96.2 kg (212 lb 1.3 oz)  01/10/12 96.299 kg (212 lb 4.8 oz)  01/10/12 96.299 kg (212 lb 4.8 oz)    Physical Exam:   .  Constitutional: He is oriented to person, place, and time. He appears well-developed and well-nourished.  HENT:  Head: Normocephalic and atraumatic.  Eyes: Pupils are equal, round, and reactive to light.  Neck: Normal range of motion.  Cardiovascular: Normal rate and regular rhythm.  Pulmonary/Chest: Effort normal and breath sounds normal.  Abdominal: Soft. Bowel sounds are normal.  Musculoskeletal: He exhibits edema (moderate edema L-AKA).  Neurological: He is alert and oriented to person, place, and time.  Follows basic commands without difficulty. Strength grossly 4/5 ue's. RLE is 3-4/5 lle is limited due to pain, grossly 3/5.  Skin: Skin is warm and dry.  Multiple ecchymotic areas right forearm, left flank and area on abdomen, bruises right arm also. Dry skin right foot. L-AKA with small amount of sero-sanguinous drainage stil. Wound well approximated.    Assessment/Plan: 1. Functional deficits secondary to left AKA which require 3+ hours per day of interdisciplinary  therapy in a comprehensive inpatient rehab setting. Physiatrist is providing close team supervision and 24 hour management of active medical problems listed below. Physiatrist and rehab team continue to assess barriers to discharge/monitor patient progress toward functional and medical goals. FIM:                   Comprehension Comprehension Mode: Auditory Comprehension: 4-Understands basic 75 - 89% of the time/requires cueing 10 - 24% of the time  Expression Expression Mode: Verbal Expression: 4-Expresses basic 75 - 89% of the time/requires cueing 10 - 24% of the time. Needs helper to occlude trach/needs to repeat words.     Problem Solving Problem Solving: 4-Solves basic 75 - 89% of the time/requires cueing 10 - 24% of the time  Memory Memory: 4-Recognizes or recalls 75 - 89% of the time/requires cueing 10 - 24% of the time  Medical Problem List and Plan:  1. PE/BLE DVT Prophylaxis/Anticoagulation: Pharmaceutical: Coumadin  2. Pain Management: prn medications effective. Will change baclofen to flexeril as at home.  3. Mood: no signs of distress noted. LCSW to follow for formal evaluation.  4. Neuropsych: This patient is capable of making decisions on his/her own behalf.  5. NSVT: Coreg dose increased today. Monitor for symptoms of recurrence.  6. Acute renal failure: Improving. Recheck lytes stable to improved.  Avoid nephrotoxic drugs. Off ACE/NSAIDS.  7. BPH: continue avodart. Denies voiding problems     LOS (Days) 1 A FACE TO FACE EVALUATION  WAS PERFORMED  Sahara Fujimoto T 01/11/2012, 8:44 AM

## 2012-01-11 NOTE — Plan of Care (Signed)
Problem: RH PAIN MANAGEMENT Goal: RH OTHER STG PAIN MANAGEMENT GOALS W/ASSIST Other STG Pain Management Goals With min Assistance.  Techniques to alleviate phantom pain.

## 2012-01-12 ENCOUNTER — Inpatient Hospital Stay (HOSPITAL_COMMUNITY): Payer: Medicare Other | Admitting: Occupational Therapy

## 2012-01-12 ENCOUNTER — Inpatient Hospital Stay (HOSPITAL_COMMUNITY): Payer: Medicare Other | Admitting: Physical Therapy

## 2012-01-12 ENCOUNTER — Inpatient Hospital Stay (HOSPITAL_COMMUNITY): Payer: Medicare Other | Admitting: *Deleted

## 2012-01-12 ENCOUNTER — Inpatient Hospital Stay (HOSPITAL_COMMUNITY): Payer: Medicare Other

## 2012-01-12 DIAGNOSIS — N179 Acute kidney failure, unspecified: Secondary | ICD-10-CM

## 2012-01-12 DIAGNOSIS — S78119A Complete traumatic amputation at level between unspecified hip and knee, initial encounter: Secondary | ICD-10-CM

## 2012-01-12 DIAGNOSIS — I70269 Atherosclerosis of native arteries of extremities with gangrene, unspecified extremity: Secondary | ICD-10-CM

## 2012-01-12 DIAGNOSIS — I509 Heart failure, unspecified: Secondary | ICD-10-CM

## 2012-01-12 DIAGNOSIS — I2699 Other pulmonary embolism without acute cor pulmonale: Secondary | ICD-10-CM

## 2012-01-12 MED ORDER — CYCLOBENZAPRINE HCL 5 MG PO TABS
5.0000 mg | ORAL_TABLET | Freq: Every day | ORAL | Status: DC
  Administered 2012-01-12 – 2012-01-23 (×13): 5 mg via ORAL
  Filled 2012-01-12 (×14): qty 1

## 2012-01-12 NOTE — Progress Notes (Signed)
Subjective/Complaints: No complaints. Pain under reasonable control. Good appetite  A 12 point review of systems has been performed and if not noted above is otherwise negative.   Objective: Vital Signs: Blood pressure 112/56, pulse 74, temperature 98.5 F (36.9 C), temperature source Oral, resp. rate 17, height 5\' 8"  (1.727 m), weight 96.2 kg (212 lb 1.3 oz), SpO2 99.00%. No results found.  Basename 01/11/12 0640 01/10/12 0540  WBC 7.9 7.9  HGB 10.6* 11.0*  HCT 33.3* 34.0*  PLT 197 186    Basename 01/11/12 0640 01/10/12 0540  NA 137 137  K 4.6 4.7  CL 104 105  CO2 24 26  GLUCOSE 103* 98  BUN 24* 23  CREATININE 1.68* 1.77*  CALCIUM 8.4 8.6   CBG (last 3)  No results found for this basename: GLUCAP:3 in the last 72 hours  Wt Readings from Last 3 Encounters:  01/10/12 96.2 kg (212 lb 1.3 oz)  01/10/12 96.299 kg (212 lb 4.8 oz)  01/10/12 96.299 kg (212 lb 4.8 oz)    Physical Exam:   .  Constitutional: He is oriented to person, place, and time. He appears well-developed and well-nourished.  HENT:  Head: Normocephalic and atraumatic.  Eyes: Pupils are equal, round, and reactive to light.  Neck: Normal range of motion.  Cardiovascular: Normal rate and regular rhythm.  Pulmonary/Chest: Effort normal and breath sounds normal.  Abdominal: Soft. Bowel sounds are normal.  Musculoskeletal: He exhibits edema (moderate edema L-AKA).  Neurological: He is alert and oriented to person, place, and time.  Follows basic commands without difficulty. Strength grossly 4/5 ue's. RLE is 3-4/5 lle is limited due to pain, grossly 3/5.  Skin: Skin is warm and dry.  Multiple ecchymotic areas right forearm, left flank and area on abdomen, bruises right arm also. Dry skin right foot. L-AKA with minimal sero-sanguinous drainage stil. Wound well approximated.    Assessment/Plan: 1. Functional deficits secondary to left AKA which require 3+ hours per day of interdisciplinary therapy in a  comprehensive inpatient rehab setting. Physiatrist is providing close team supervision and 24 hour management of active medical problems listed below. Physiatrist and rehab team continue to assess barriers to discharge/monitor patient progress toward functional and medical goals. FIM: FIM - Bathing Bathing Steps Patient Completed: Chest;Right Arm;Left Arm;Abdomen;Front perineal area;Right upper leg Bathing: 3: Mod-Patient completes 5-7 62f 10 parts or 50-74%  FIM - Upper Body Dressing/Undressing Upper body dressing/undressing steps patient completed: Thread/unthread right sleeve of pullover shirt/dresss;Thread/unthread left sleeve of pullover shirt/dress;Put head through opening of pull over shirt/dress;Pull shirt over trunk Upper body dressing/undressing: 5: Set-up assist to: Obtain clothing/put away FIM - Lower Body Dressing/Undressing Lower body dressing/undressing steps patient completed: Thread/unthread right underwear leg;Thread/unthread left underwear leg Lower body dressing/undressing: 3: Mod-Patient completed 50-74% of tasks  FIM - Toileting Toileting: 0: Activity did not occur  FIM - Archivist Transfers: 0-Activity did not occur  FIM - Banker Devices: Sliding board Bed/Chair Transfer: 2: Bed > Chair or W/C: Max A (lift and lower assist)  FIM - Locomotion: Wheelchair Locomotion: Wheelchair: 5: Travels 150 ft or more: maneuvers on rugs and over door sills with supervision, cueing or coaxing FIM - Locomotion: Ambulation Ambulation/Gait Assistance: 1: +2 Total assist (unable to take any steps) Locomotion: Ambulation: 0: Activity did not occur  Comprehension Comprehension Mode: Auditory Comprehension: 5-Understands complex 90% of the time/Cues < 10% of the time  Expression Expression Mode: Verbal Expression: 5-Expresses basic needs/ideas: With no assist  Social  Interaction Social Interaction: 6-Interacts appropriately  with others with medication or extra time (anti-anxiety, antidepressant).  Problem Solving Problem Solving: 5-Solves complex 90% of the time/cues < 10% of the time  Memory Memory: 5-Recognizes or recalls 90% of the time/requires cueing < 10% of the time  Medical Problem List and Plan:  1. PE/BLE DVT Prophylaxis/Anticoagulation: Pharmaceutical: Coumadin  2. Pain Management: prn medications effective. Will change baclofen to flexeril as at home.  3. Mood: no signs of distress noted. LCSW to follow for formal evaluation.  4. Neuropsych: This patient is capable of making decisions on his/her own behalf.  5. NSVT: Coreg dose increased  . Monitor for symptoms of recurrence.  6. Acute renal failure: Improving. Recheck lytes stable to improved.  Avoid nephrotoxic drugs. Off ACE/NSAIDS.  7. BPH: continue avodart. Denies voiding problems     LOS (Days) 2 A FACE TO FACE EVALUATION WAS PERFORMED  Daryon Remmert T 01/12/2012, 7:52 AM

## 2012-01-12 NOTE — Progress Notes (Signed)
Physical Therapy Session Note  Patient Details  Name: Kevin [name] MRN: [id number] Date of Birth: Aug 14, 1940  Today's Date: 01/12/2012 Time: 1330-1400 Time Calculation (min): 30 min  Short Term Goals: Week 1:  PT Short Term Goal 1 (Week 1): Pt will perform bed mobility with min assist PT Short Term Goal 2 (Week 1): Pt will perform bed <> chair transfer with min assist PT Short Term Goal 3 (Week 1): Pt will perform sit <> stand with max assist PT Short Term Goal 4 (Week 1): Pt will have HEP started and report compliance with performance.   Skilled Therapeutic Interventions/Progress Updates:  Tx focused on WC propulsion for UE strength, standing tolerance, and sliding board transfers.   Pt propelled WC 2x150' with S only and good stride length, cues needed for brakes, but pt able to remove leg rest.   Attempted standing at side of // bars x2, but pt unable to clear hips with +1 total assist, so instead stood in standing frame x37min with no pressure on residual limb. Pt needing cues to lift chest and tighten glutes, which he was able to do.    Performed WC>bed with sliding board and Min A for steadying/safety as pt accomplished transfer by rotating trunk so it became a posterior slide rather than lateral. Pt given cues for safest technique as well as hand placement, needing assist for placement and removal of board.  Sit>supine with S only.      Therapy Documentation Precautions:  Precautions Precautions: Fall Restrictions Weight Bearing Restrictions: No    Pain: No complaints  See FIM for current functional status  Therapy/Group: Individual Therapy  Virl Cagey, PT 01/12/2012, 2:41 PM

## 2012-01-12 NOTE — Progress Notes (Signed)
Occupational Therapy Session Note  Patient Details  Name: Kevin [name] MRN: [id number] Date of Birth: [birth year]  Today's Date: 01/12/2012 Time: 0900-0930 Time Calculation (min): 30 min  Short Term Goals: Week 1:  OT Short Term Goal 1 (Week 1): LB bath in sit and stand with Mod assist OT Short Term Goal 2 (Week 1): LB dressing in sit and stand with Mod assist OT Short Term Goal 3 (Week 1): Scoot transfers to and from drop arm commode with min assist OT Short Term Goal 4 (Week 1): Toileting with mod assist to include lateral leans OT Short Term Goal 5 (Week 1): BUE exercises 4/7 days   Skilled Therapeutic Interventions/Progress Updates:    Pt worked on Hydrologist transfers from bed to mat with min assist on level surface.  Worked on using push up blocks for 2 set of 10 reps.  Pt unable to lift bottom off of mat efficiently unless given max assist.  Demonstrates decreased forward weight shift over the RLE as well.  Progressed to scooting up the edge of the mat with max assist to the right and mod assist to the left as well.  After transfer back to the wheelchair had pt propel himself back to the room with supervision.  Therapy Documentation Precautions:  Precautions Precautions: Fall Restrictions Weight Bearing Restrictions: No  Pain: Pain Assessment Pain Assessment: 0-10 Pain Score:   8 Pain Type: Surgical pain Pain Location: Leg Pain Orientation: Left Pain Descriptors: Aching Pain Frequency: Intermittent Pain Onset: Gradual Patients Stated Pain Goal: 3 Pain Intervention(s): Medication (See eMAR);Repositioned Multiple Pain Sites: No  See FIM for current functional status  Therapy/Group: Individual Therapy  Aviyana Sonntag,Emeka OTR/L 01/12/2012, 10:33 AM

## 2012-01-12 NOTE — Progress Notes (Signed)
Patient ID: Kevin [name], male   DOB: 21-Feb-1941, 71 y.o.   MRN: [id number] Up in chair in rehabilitation. Comfortable. Dressings intact. Following from the side lines.

## 2012-01-12 NOTE — Progress Notes (Signed)
Social Work Patient ID: Kevin [name], male   DOB: 02-26-1941, 71 y.o.   MRN: [id number]  Met this morning with pt to review team conference - agreeable with targeted d/c 11/11 at supervision to some mod assist goals.  Feels he is making progress but agreed his is "weak". Will continue to follow and assist with d/c planning.  Kevin [name]

## 2012-01-12 NOTE — Progress Notes (Signed)
Physical Therapy Session Note  Patient Details  Name: Kevin [name] MRN: [id number] Date of Birth: 1940-12-25  Today's Date: 01/12/2012 Time: 1002-1100 Time Calculation (min): 58 min  Short Term Goals: Week 1:  PT Short Term Goal 1 (Week 1): Pt will perform bed mobility with min assist PT Short Term Goal 2 (Week 1): Pt will perform bed <> chair transfer with min assist PT Short Term Goal 3 (Week 1): Pt will perform sit <> stand with max assist PT Short Term Goal 4 (Week 1): Pt will have HEP started and report compliance with performance.   Skilled Therapeutic Interventions/Progress Updates:    Pt performed w/c propulsion x 200 on level surfaces/hospital environment with supervision.  Transfers w/c-mat-w/c using slide board and mod assist.  Needs assist for board placement, balance and to complete transfer.  To supine with min assist on mat.  Performed L AKA exercises x 15 of hip flexion, hip extension, hip abd/add, glut set and hip extension in right sidelying.  Performed R LE heel slides, hip abd/add, SAQ, hip extension/bridging x 15 reps with 3lb weight.  Pt moved to prone position to stretch L hip flexors and able to maintain x 5 minutes but unable to achieve full prone.  Sitting EOM performed R LAQ with 3 lb weight.  Therapy Documentation Precautions:  Precautions Precautions: Fall Restrictions Weight Bearing Restrictions: No Pain: Pain Assessment Pain Assessment: 0-10 Pain Score:   7 Pain Type: Surgical pain Pain Location: Leg Pain Orientation: Left Pain Descriptors: Aching Pain Frequency: Intermittent Pain Onset: Gradual Patients Stated Pain Goal: 3 Pain Intervention(s): Medication (See eMAR);Repositioned Multiple Pain Sites: No  Pt premedicated prior to therapy.  See FIM for current functional status  Therapy/Group: Individual Therapy  Newell Coral 01/12/2012, 12:23 PM

## 2012-01-12 NOTE — Progress Notes (Signed)
[facility] Individual Statement of Services  Patient Name:  Kevin [name]  Date:  01/12/2012  Welcome to the [facility].  Our goal is to provide you with an individualized program based on your diagnosis and situation, designed to meet your specific needs.  With this comprehensive rehabilitation program, you will be expected to participate in at least 3 hours of rehabilitation therapies Monday-Friday, with modified therapy programming on the weekends.  Your rehabilitation program will include the following services:  Physical Therapy (PT), Occupational Therapy (OT), 24 hour per day rehabilitation nursing, Therapeutic Recreaction (TR), Case Management  (Social Worker), Rehabilitation Medicine, Nutrition Services and Pharmacy Services  Weekly team conferences will be held on Tuesdays to discuss your progress.  Your  Social Worker will talk with you frequently to get your input and to update you on team discussions.  Team conferences with you and your family in attendance may also be held.  Expected length of stay: 2 weeks  Overall anticipated outcome: minimal assistance (wheelchair level)  Depending on your progress and recovery, your program may change.  Your  Social Worker will coordinate services and will keep you informed of any changes.  Your  Social Worker's name and contact numbers are listed  below.  The following services may also be recommended but are not provided by the [facility]:   Driving Evaluations  Home Health Rehabiltiation Services  Outpatient Rehabilitatation [facility]  Vocational Rehabilitation   Arrangements will be made to provide these services after discharge if needed.  Arrangements include referral to agencies that provide these services.  Your insurance has been verified to be:  Medicare and Medicaid Your primary doctor is:  Dr. Jeri Cos  Pertinent information will be shared with your  doctor and your insurance company.  Social Worker:  [place], [place] [phone] or ([phone]  Information discussed with and copy given to patient by: Amada Jupiter, 01/12/2012, 3:54 PM

## 2012-01-12 NOTE — Progress Notes (Signed)
ANTICOAGULATION CONSULT NOTE - Follow Up Consult  Pharmacy Consult: Lovenox / Coumadin Indication:  B/L PE + right DVT  No Known Allergies  Patient Measurements: Height: 5\' 8"  (172.7 cm) Weight: 212 lb 1.3 oz (96.2 kg) IBW/kg (Calculated) : 68.4   Vital Signs: Temp: 98.5 F (36.9 C) (10/30 0626) Temp src: Oral (10/30 0626) BP: 112/56 mmHg (10/30 0626) Pulse Rate: 74  (10/30 0626)  Labs:  Basename 01/12/12 0620 01/11/12 0640 01/10/12 0540  HGB -- 10.6* 11.0*  HCT -- 33.3* 34.0*  PLT -- 197 186  APTT -- -- --  LABPROT 31.4* 25.5* 22.9*  INR 3.25* 2.46* 2.13*  HEPARINUNFRC -- -- --  CREATININE -- 1.68* 1.77*  CKTOTAL -- -- --  CKMB -- -- --  TROPONINI -- -- --    Estimated Creatinine Clearance: 44.7 ml/min (by C-G formula based on Cr of 1.68).     Assessment: 30 YOM with B/L PE and right DVT to continue on Coumadin.  His INR is now supratherapeutic with a rapid increase since 10/27.  Goal of Therapy:  INR 2-3   Plan:  No Coumadin today Daily PT/INR  Estella Husk, Pharm.D., BCPS Clinical Pharmacist  Phone [phone] Pager [phone] 01/12/2012, 1:11 PM

## 2012-01-12 NOTE — Patient Care Conference (Signed)
Inpatient RehabilitationTeam Conference Note Date: 01/11/2012   Time: 2:55 PM    Patient Name: Kevin [name]      Medical Record Number: [id number]  Date of Birth: January 03, 1941 Sex: Male         Room/Bed: 4032/4032-01 Payor Info: Payor: MEDICARE  Plan: MEDICARE PART A AND B  Product Type: *No Product type*     Admitting Diagnosis: AKA  Admit Date/Time:  01/10/2012  4:59 PM Admission Comments: No comment available   Primary Diagnosis:  Unilateral complete BKA Principal Problem: Unilateral complete BKA  Patient Active Problem List   Diagnosis Date Noted  . Unilateral complete BKA-left 01/11/2012  . Renal failure 01/11/2012  . Leucocytosis 01/11/2012  . DVT, femoral, acute 01/11/2012  . DVT of axillary vein, acute 01/04/2012  . Acute pulmonary embolism 01/04/2012  . 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 Severe sepsis(995.92) 01/02/2012  . Septic shock(785.52) 01/02/2012  . Hypotension 01/02/2012  . Right ventricular failure 01/02/2012  . 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 HTN (hypertension) 12/31/2011  . CAD (coronary artery disease) 12/31/2011  . CHF (congestive heart failure) 12/31/2011  . Tobacco abuse 12/31/2011  . Hyperlipidemia 12/31/2011    Expected Discharge Date: Expected Discharge Date: 01/24/12  Team Members Present: Physician: Dr. Faith Rogue Nurse Present: Carlean Purl, RN PT Present: Reggy Eye, PT OT Present: Ardis Rowan, COTA SLP Present: Feliberto Gottron, SLP Other (Discipline and Name): Tora Duck, PPS Coordinator     Current Status/Progress Goal Weekly Team Focus  Medical   admitted for left AKA, deconditioned from prolonged pre-op wound course  pain mgt, wound care  see above   Bowel/Bladder   continent of bowel and bladder  remain continent of bowel and bladder  remain continent of bowel and bladder   Swallow/Nutrition/ Hydration             ADL's   mod A/max A overall  supervision for bathing; supervision for toilet transfers; min A for  toileting and LB dressing  ADL retraining; drop arm BSC ransfers; dynamic sitting balance; activity tolerance   Mobility   Mod assist bed mobility/transfers; +2 total assist for sit <> stand  Supervision for transfers, mod assist in standing  increase independence and safety with transfers, activity tolerance, generalized strengthening, standing activity.    Communication             Safety/Cognition/ Behavioral Observations            Pain   Acetaminophen 325-650 mg PRN,  Oxycodone IR 5-10mg  denied pain at this time  pain less or equal to 3  free of pain   Skin   Briusing to abdomen, arm and legs  free of new skin breakdown  free of new skin breakdown    Rehab Goals Patient on target to meet rehab goals: Yes *See Interdisciplinary Assessment and Plan and progress notes for long and short-term goals  Barriers to Discharge: prolonged state of immobility    Possible Resolutions to Barriers:  stamina and endurance training, wheel chair mobility    Discharge Planning/Teaching Needs:  home with family to provide 24/7 assistance - plan still being work out per pt      Team Discussion:  Pt very weak and making slow gains. No concerns at this point as just beginning program.  Will need 24/7 care upon d/c.  Revisions to Treatment Plan:  None   Continued Need for Acute Rehabilitation Level of Care: The patient requires daily medical management by a physician with specialized training  in physical medicine and rehabilitation for the following conditions: Daily direction of a multidisciplinary physical rehabilitation program to ensure safe treatment while eliciting the highest outcome that is of practical value to the patient.: Yes Daily medical management of patient stability for increased activity during participation in an intensive rehabilitation regime.: Yes Daily analysis of laboratory values and/or radiology reports with any subsequent need for medication adjustment of medical  intervention for : Post surgical problems;Other  Barbi Kumagai 01/12/2012, 1:41 PM

## 2012-01-12 NOTE — Progress Notes (Signed)
Physical Therapy Session Note  Patient Details  Name: Kevin [name] MRN: [id number] Date of Birth: 08-05-1940  Today's Date: 01/12/2012 Time: 4098-1191 Time Calculation (min): 31 min  Short Term Goals: Week 1:  PT Short Term Goal 1 (Week 1): Pt will perform bed mobility with min assist PT Short Term Goal 2 (Week 1): Pt will perform bed <> chair transfer with min assist PT Short Term Goal 3 (Week 1): Pt will perform sit <> stand with max assist PT Short Term Goal 4 (Week 1): Pt will have HEP started and report compliance with performance.   Skilled Therapeutic Interventions/Progress Updates: Pt performed L LE strengthening AROM x 15 and R AKA exercises A/AAROM x 15 reps supine and sidelying in bed.  Pt able to scoot up to [facility] with rails and mod assist.  Rolls with supervision and rails.     Therapy Documentation Precautions:  Precautions Precautions: Fall Restrictions Weight Bearing Restrictions: No Pain: Pain Assessment Pain Assessment: 0-10 Pain Score:   8 Pain Type: Surgical pain Pain Location: Leg Pain Orientation: Left Pain Descriptors: Aching Pain Frequency: Intermittent Pain Onset: Gradual Patients Stated Pain Goal: 3 Pain Intervention(s): Medication (See eMAR);Repositioned Multiple Pain Sites: No  See FIM for current functional status  Therapy/Group: Individual Therapy  Newell Coral 01/12/2012, 4:06 PM

## 2012-01-12 NOTE — Progress Notes (Signed)
Occupational Therapy Session Note  Patient Details  Name: Kevin [name] MRN: [id number] Date of Birth: May 16, 1940  Today's Date: 01/12/2012 Time: 4098-1191 Time Calculation (min): 45 min  Short Term Goals: Week 1:  OT Short Term Goal 1 (Week 1): LB bath in sit and stand with Mod assist OT Short Term Goal 2 (Week 1): LB dressing in sit and stand with Mod assist OT Short Term Goal 3 (Week 1): Scoot transfers to and from drop arm commode with min assist OT Short Term Goal 4 (Week 1): Toileting with mod assist to include lateral leans OT Short Term Goal 5 (Week 1): BUE exercises 4/7 days   Skilled Therapeutic Interventions/Progress Updates:    Pt sitting EOB eating breakfast upon arrival but agreeable to engaging in bathing and dressing tasks sitting EOB.  Pt bathes buttocks and pulls up pants employing lateral leans.  When leaning to left patient has tendency to place both legs on bed and rolling.  Discussed with patient importance of being able to perform lateral leans to both side to facilitate pulling pants up and down when using BSC.  Pt performed sliding board transfer to w/c with min A.  Focus on activity tolerance, sitting balance, lateral leans, safety awareness, and transfers.  Therapy Documentation Precautions:  Precautions Precautions: Fall Restrictions Weight Bearing Restrictions: Yes Pain: Pain Assessment Pain Assessment: No/denies pain  See FIM for current functional status  Therapy/Group: Individual Therapy  [name] 01/12/2012, 8:37 AM

## 2012-01-13 ENCOUNTER — Inpatient Hospital Stay (HOSPITAL_COMMUNITY): Payer: Medicare Other

## 2012-01-13 ENCOUNTER — Inpatient Hospital Stay (HOSPITAL_COMMUNITY): Payer: Medicare Other | Admitting: Physical Therapy

## 2012-01-13 LAB — CBC
HCT: 31.9 % — ABNORMAL LOW (ref 39.0–52.0)
MCH: 27.4 pg (ref 26.0–34.0)
MCV: 84.8 fL (ref 78.0–100.0)
RBC: 3.76 MIL/uL — ABNORMAL LOW (ref 4.22–5.81)
RDW: 18.2 % — ABNORMAL HIGH (ref 11.5–15.5)
WBC: 7.2 10*3/uL (ref 4.0–10.5)

## 2012-01-13 MED ORDER — WARFARIN SODIUM 6 MG PO TABS
6.0000 mg | ORAL_TABLET | Freq: Once | ORAL | Status: AC
  Administered 2012-01-13: 6 mg via ORAL
  Filled 2012-01-13: qty 1

## 2012-01-13 NOTE — Progress Notes (Signed)
Physical Therapy Session Note  Patient Details  Name: Kevin [name] MRN: [id number] Date of Birth: 1940/11/03  Today's Date: 01/13/2012 Time: 1115-1200 Time Calculation (min): 45 min  Short Term Goals: Week 1:  PT Short Term Goal 1 (Week 1): Pt will perform bed mobility with min assist PT Short Term Goal 2 (Week 1): Pt will perform bed <> chair transfer with min assist PT Short Term Goal 3 (Week 1): Pt will perform sit <> stand with max assist PT Short Term Goal 4 (Week 1): Pt will have HEP started and report compliance with performance.   Skilled Therapeutic Interventions/Progress Updates:  Patient performed WC mobility on level surface >200' x2 with minimal shortness of breath.  Patient taken outside where he was instructed on [facility] safety and mobility techniques on unlevel surfaces, exiting the building, passing through doorways.  Patient required cuing to remember to set his brakes at rest and required min A for instruction for maneuvering around turns.  Patient became short of breath after 50' of mild incline and requested rest break, reporting mild RPE.  Patient's WC required adjustments made to both brake levers, right armrest latch and right legrest length.  Adjustments made in gym following completion of WC mobility instructions.     Therapy Documentation Precautions:  Precautions Precautions: Fall Restrictions Weight Bearing Restrictions: Yes    Pain: Pain Assessment Pain Assessment: 0-10 Pain Score:   3 Pain Type: Acute pain Pain Location: Groin Pain Orientation: Left Pain Descriptors: Aching Pain Frequency: Occasional Pain Onset: Gradual Patients Stated Pain Goal: 2 Pain Intervention(s): RN notified See FIM for current functional status  Therapy/Group: Individual Therapy  Rexene Agent 01/13/2012, 3:35 PM

## 2012-01-13 NOTE — Progress Notes (Addendum)
Physical Therapy Session Note  Patient Details  Name: Kevin [name] MRN: [id number] Date of Birth: [birth year]  Today's Date: 01/13/2012 Time: 0930-1026 Time Calculation (min): 56 min  Short Term Goals: Week 1:  PT Short Term Goal 1 (Week 1): Pt will perform bed mobility with min assist PT Short Term Goal 2 (Week 1): Pt will perform bed <> chair transfer with min assist PT Short Term Goal 3 (Week 1): Pt will perform sit <> stand with max assist PT Short Term Goal 4 (Week 1): Pt will have HEP started and report compliance with performance.   Skilled Therapeutic Interventions/Progress Updates:    wheelchair propulsion 2 x 150' for generalized strength and conditioning. Standing frame working on standing tolerance, Rt. LE strengthening, and decreasing bil. UE reliance by participating in single upper extremity reaching task. Pt able to maintain standing for short distances in frame with nearly no sling support however has difficulty with transition to/from stand secondary to weakness. Sliding board transfer wheelchair <> mat with min assist, pt practicing wheelchair and board placement, mod cues still needed. Initiated Lt. AKA bed exercises (glute sets, supine hip extension, side lying hip extension 2 x 10 reps) pt given handout and is to perform 1x a day outside of schedule therapies.    Discussed ramp again, pt given handout. Encouraged to have family begin building as soon as possible.   Therapy Documentation Precautions:  Precautions Precautions: Fall Restrictions Weight Bearing Restrictions: Yes Pain: No c/o pain  See FIM for current functional status  Therapy/Group: Individual Therapy  Wilhemina Bonito 01/13/2012, 11:53 AM

## 2012-01-13 NOTE — Progress Notes (Addendum)
Subjective/Complaints: No complaints. Excited about functional progress    A 12 point review of systems has been performed and if not noted above is otherwise negative.   Objective: Vital Signs: Blood pressure 106/66, pulse 64, temperature 98.5 F (36.9 C), temperature source Oral, resp. rate 16, height 5\' 8"  (1.727 m), weight 96.2 kg (212 lb 1.3 oz), SpO2 98.00%. No results found.  Basename 01/13/12 0605 01/11/12 0640  WBC 7.2 7.9  HGB 10.3* 10.6*  HCT 31.9* 33.3*  PLT 207 197    Basename 01/11/12 0640  NA 137  K 4.6  CL 104  CO2 24  GLUCOSE 103*  BUN 24*  CREATININE 1.68*  CALCIUM 8.4   CBG (last 3)  No results found for this basename: GLUCAP:3 in the last 72 hours  Wt Readings from Last 3 Encounters:  01/10/12 96.2 kg (212 lb 1.3 oz)  01/10/12 96.299 kg (212 lb 4.8 oz)  01/10/12 96.299 kg (212 lb 4.8 oz)    Physical Exam:   .  Constitutional: He is oriented to person, place, and time. He appears well-developed and well-nourished.  HENT:  Head: Normocephalic and atraumatic.  Eyes: Pupils are equal, round, and reactive to light.  Neck: Normal range of motion.  Cardiovascular: Normal rate and regular rhythm.  Pulmonary/Chest: Effort normal and breath sounds normal.  Abdominal: Soft. Bowel sounds are normal.  Musculoskeletal: He exhibits edema (moderate edema L-AKA).  Neurological: He is alert and oriented to person, place, and time.  Follows basic commands without difficulty. Strength grossly 4/5 ue's. RLE is 3-4/5 lle is limited due to pain, grossly 3/5.  Skin: Skin is warm and dry.  Multiple ecchymotic areas right forearm, left flank and area on abdomen, bruises right arm also. Dry skin right foot. L-AKA with minimal sero-sanguinous drainage stil. Wound well approximated.    Assessment/Plan: 1. 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 Physiatrist is  providing close team supervision and 24 hour management of active medical problems listed below. Physiatrist and rehab team continue to assess barriers to discharge/monitor patient progress toward functional and medical goals.  Pt is improving daily.  FIM: FIM - Bathing Bathing Steps Patient Completed: Chest;Right Arm;Left Arm;Abdomen;Front perineal area;Right upper leg;Buttocks;Right lower leg (including foot) Bathing: 4: Steadying assist  FIM - Upper Body Dressing/Undressing Upper body dressing/undressing steps patient completed: Thread/unthread right sleeve of pullover shirt/dresss;Thread/unthread left sleeve of pullover shirt/dress;Put head through opening of pull over shirt/dress;Pull shirt over trunk Upper body dressing/undressing: 5: Set-up assist to: Obtain clothing/put away FIM - Lower Body Dressing/Undressing Lower body dressing/undressing steps patient completed: Thread/unthread right underwear leg;Thread/unthread left underwear leg;Thread/unthread right pants leg;Thread/unthread left pants leg;Don/Doff right sock Lower body dressing/undressing: 3: Mod-Patient completed 50-74% of tasks  FIM - Toileting Toileting: 0: Activity did not occur  FIM - Archivist Transfers: 0-Activity did not occur  FIM - Banker Devices: Sliding board Bed/Chair Transfer: 5: Sit > Supine: Supervision (verbal cues/safety issues);4: Chair or W/C > Bed: Min A (steadying Pt. > 75%)  FIM - Locomotion: Wheelchair Locomotion: Wheelchair: 5: Travels 150 ft or more: maneuvers on rugs and over door sills with supervision, cueing or coaxing FIM - Locomotion: Ambulation Ambulation/Gait Assistance: 1: +2 Total assist (unable to take any steps) Locomotion: Ambulation: 0: Activity did not occur  Comprehension Comprehension Mode: Auditory Comprehension: 5-Understands complex 90% of the time/Cues < 10% of the time  Expression Expression Mode:  Verbal Expression: 5-Expresses basic needs/ideas: With no assist  Social Interaction Social Interaction: 7-Interacts appropriately with others - No medications needed.  Problem Solving Problem Solving: 5-Solves complex 90% of the time/cues < 10% of the time  Memory Memory: 5-Requires cues to use assistive device  Medical Problem List and Plan:  1. PE/BLE DVT Prophylaxis/Anticoagulation: Pharmaceutical: Coumadin  2. Pain Management: prn medications effective. Will change baclofen to flexeril as at home.  3. Mood: no signs of distress noted. LCSW to follow for formal evaluation.  4. Neuropsych: This patient is capable of making decisions on his/her own behalf.  5. NSVT: Coreg dose increased  . Monitor for symptoms of recurrence.  6. Acute renal failure: Improving. Recheck lytes stable to improved.  Avoid nephrotoxic drugs. Off ACE/NSAIDS.  7. BPH: continue avodart. Denies voiding problems  8. Systolic Heart Failure: continue to monitor I's/O's, weights.     LOS (Days) 3 A FACE TO FACE EVALUATION WAS PERFORMED  Rainen Vanrossum T 01/13/2012, 8:13 AM

## 2012-01-13 NOTE — Progress Notes (Signed)
ANTICOAGULATION CONSULT NOTE - Follow Up Consult  Pharmacy Consult: Lovenox / Coumadin Indication:  B/L PE + right DVT  No Known Allergies  Patient Measurements: Height: 5\' 8"  (172.7 cm) Weight: 212 lb 1.3 oz (96.2 kg) IBW/kg (Calculated) : 68.4   Vital Signs: Temp: 98.5 F (36.9 C) (10/31 0626) Temp src: Oral (10/31 0626) BP: 106/66 mmHg (10/31 0626) Pulse Rate: 64  (10/31 0626)  Labs:  Basename 01/13/12 0605 01/12/12 0620 01/11/12 0640  HGB 10.3* -- 10.6*  HCT 31.9* -- 33.3*  PLT 207 -- 197  APTT -- -- --  LABPROT 25.1* 31.4* 25.5*  INR 2.41* 3.25* 2.46*  HEPARINUNFRC -- -- --  CREATININE -- -- 1.68*  CKTOTAL -- -- --  CKMB -- -- --  TROPONINI -- -- --    Estimated Creatinine Clearance: 44.7 ml/min (by C-G formula based on Cr of 1.68).   Assessment: 58 YOM with B/L PE and right DVT to continue on Coumadin.  His INR is now therapeutic s/p dose held on 10/30, suspect may continue to decrease before leveling off again. Will try to avoid aggressive dosing to avoid sudden bump in INR. CBC is stable, no bleeding reported.  Goal of Therapy:  INR 2-3   Plan:  - Coumadin 6mg  PO x 1 today - Will f/up daily INR   Thanks, Elowyn Raupp K. Allena Katz, PharmD, BCPS.  Clinical Pharmacist Pager [phone]. 01/13/2012 2:35 PM

## 2012-01-13 NOTE — Progress Notes (Signed)
Occupational Therapy Session Note  Patient Details  Name: Kevin [name] MRN: [id number] Date of Birth: 04-Oct-1940  Today's Date: 01/13/2012  Session 1 Time: 0800-0900 Time Calculation (min): 60 min  Short Term Goals: Week 1:  OT Short Term Goal 1 (Week 1): LB bath in sit and stand with Mod assist OT Short Term Goal 2 (Week 1): LB dressing in sit and stand with Mod assist OT Short Term Goal 3 (Week 1): Scoot transfers to and from drop arm commode with min assist OT Short Term Goal 4 (Week 1): Toileting with mod assist to include lateral leans OT Short Term Goal 5 (Week 1): BUE exercises 4/7 days   Skilled Therapeutic Interventions/Progress Updates:    Pt engaged in ADL retraining including bathing and dressing seated EOB.  Pt employs lateral leans to doff and donn underpants and pants.  Pt continues to require min A for pulling up pants.  Pt exhibits difficulty with lateral lean especially to left and has tendency to lean posteriorly instead of laterally.  Pt stated he wanted to try to use BSC.  Pt wanted to attempt scoot transfer to [facility] from bed but required min A.  Pt agreed that sliding board would be best option.  Pt was able to pull pants up adequately to place sliding board for transfer to bed before pulling pants up completely.  Pt tends to slide posteriorly on sliding board during transfer and is hesitant to lean forward to get adequate lift through buttocks during transfer.  Therapy Documentation Precautions:  Precautions Precautions: Fall Restrictions Weight Bearing Restrictions: Yes  Pain: Pain Assessment Pain Assessment: 0-10 Pain Score:   8 Pain Type: Acute pain Pain Location: Groin Pain Orientation: Left Pain Descriptors: Aching Pain Frequency: Occasional Pain Onset: Gradual Patients Stated Pain Goal: 2 Pain Intervention(s): RN aware; repositioned  See FIM for current functional status  Therapy/Group: Individual Therapy  Session 2 Pt denies  pain Individual therapy Pt sitting in w/c upon arrival and agreeable to rolling to gym to practice w/c setup and sliding board transfers to level surface.  Pt required min verbal cues to remove leg rest prior to transferring to mat.  Once on mat pt practiced lateral leans with emphasis on maintaining balance throughout movement.  Pt tends to lose balance posteriorly and states that he fears falling forward.  After transfer to w/c pt rolled to ADL apartment for problem solving shower transfers at home.  Pt states his bathroom is w/c accessible and he will need to get a tub bench for his walk-in shower.  Lavone Neri [facility] 01/13/2012, 9:06 AM

## 2012-01-14 ENCOUNTER — Inpatient Hospital Stay (HOSPITAL_COMMUNITY): Payer: Medicare Other

## 2012-01-14 ENCOUNTER — Inpatient Hospital Stay (HOSPITAL_COMMUNITY): Payer: Medicare Other | Admitting: Physical Therapy

## 2012-01-14 DIAGNOSIS — N179 Acute kidney failure, unspecified: Secondary | ICD-10-CM

## 2012-01-14 DIAGNOSIS — I70269 Atherosclerosis of native arteries of extremities with gangrene, unspecified extremity: Secondary | ICD-10-CM

## 2012-01-14 DIAGNOSIS — I2699 Other pulmonary embolism without acute cor pulmonale: Secondary | ICD-10-CM

## 2012-01-14 DIAGNOSIS — I509 Heart failure, unspecified: Secondary | ICD-10-CM

## 2012-01-14 DIAGNOSIS — S78119A Complete traumatic amputation at level between unspecified hip and knee, initial encounter: Secondary | ICD-10-CM

## 2012-01-14 LAB — PROTIME-INR
INR: 1.92 — ABNORMAL HIGH (ref 0.00–1.49)
Prothrombin Time: 21.2 seconds — ABNORMAL HIGH (ref 11.6–15.2)

## 2012-01-14 MED ORDER — WARFARIN SODIUM 6 MG PO TABS
6.0000 mg | ORAL_TABLET | Freq: Once | ORAL | Status: AC
  Administered 2012-01-14: 6 mg via ORAL
  Filled 2012-01-14: qty 1

## 2012-01-14 NOTE — Progress Notes (Signed)
Occupational Therapy Session Note  Patient Details  Name: Kevin [name] MRN: [id number] Date of Birth: 18-Mar-1940  Today's Date: 01/14/2012  Session 1 Time: 0800-0855 Time Calculation (min): 55 min  Short Term Goals: Week 1:  OT Short Term Goal 1 (Week 1): LB bath in sit and stand with Mod assist OT Short Term Goal 2 (Week 1): LB dressing in sit and stand with Mod assist OT Short Term Goal 3 (Week 1): Scoot transfers to and from drop arm commode with min assist OT Short Term Goal 4 (Week 1): Toileting with mod assist to include lateral leans OT Short Term Goal 5 (Week 1): BUE exercises 4/7 days   Skilled Therapeutic Interventions/Progress Updates:    Pt asleep in bed upon arrival but agreeable to participate in ADL retraining seated EOB.  Pt completed bathing and dressing tasks utilizing lateral leans to bathe buttocks and doff and donn pants.  Pt continues to exhibit difficulty with lateral leans and requires mod verbal cues for efficient technique to facilitate pulling up pants.  Pt transferred bed<>w/c with sliding board.  Pt required min verbal cues for correct board placement prior to transfer.  Pt completed grooming tasks seated in w/c at sink.  Focus on activity tolerance, lateral leans, dynamic sitting balance, transfers, and safety awareness.  Therapy Documentation Precautions:  Precautions Precautions: Fall Restrictions Weight Bearing Restrictions: Yes   Pain: Pain Assessment Pain Assessment: 0-10 Pain Score:   8 Pain Type: Acute pain Pain Location: Groin Pain Orientation: Left Pain Descriptors: Aching Pain Frequency: Occasional Pain Onset: Gradual Patients Stated Pain Goal: 2 Pain Intervention(s): Medication (See eMAR) (oxycodone 10 mg po) Multiple Pain Sites: No  See FIM for current functional status  Therapy/Group: Individual Therapy  Session 2 Time: 1330-1400 Pt denies pain Individual Therapy Pt engaged in sit<>stand activities and static standing  tasks.  Pt performed sit<>stand from w/c with RW X 4.  Pt requires min A during transitional segment of task when moving RUE from w/c to RW.  Pt attempted to remove RUE from RW to complete simple task while standing but only able to remove hand for approx 5 secs.  Pt continues to fatigue after approx 20 secs standing and requires rest break.  Kevin [name] [facility] 01/14/2012, 2:46 PM

## 2012-01-14 NOTE — Progress Notes (Signed)
Subjective/Complaints: No complaints. Getting stronger. Slept well.    A 12 point review of systems has been performed and if not noted above is otherwise negative.   Objective: Vital Signs: Blood pressure 107/81, pulse 68, temperature 98.6 F (37 C), temperature source Oral, resp. rate 20, height 5\' 8"  (1.727 m), weight 96.2 kg (212 lb 1.3 oz), SpO2 98.00%. No results found.  Basename 01/13/12 0605  WBC 7.2  HGB 10.3*  HCT 31.9*  PLT 207   No results found for this basename: NA:2,K:2,CL:2,CO2:2,GLUCOSE:2,BUN:2,CREATININE:2,CALCIUM:2 in the last 72 hours CBG (last 3)  No results found for this basename: GLUCAP:3 in the last 72 hours  Wt Readings from Last 3 Encounters:  01/10/12 96.2 kg (212 lb 1.3 oz)  01/10/12 96.299 kg (212 lb 4.8 oz)  01/10/12 96.299 kg (212 lb 4.8 oz)    Physical Exam:   .  Constitutional: He is oriented to person, place, and time. He appears well-developed and well-nourished.  HENT:  Head: Normocephalic and atraumatic.  Eyes: Pupils are equal, round, and reactive to light.  Neck: Normal range of motion.  Cardiovascular: Normal rate and regular rhythm.  Pulmonary/Chest: Effort normal and breath sounds normal.  Abdominal: Soft. Bowel sounds are normal.  Musculoskeletal: He exhibits edema (moderate edema L-AKA).  Neurological: He is alert and oriented to person, place, and time.  Follows basic commands without difficulty. Strength grossly 4/5 ue's. RLE is 3-4/5 lle is limited due to pain, grossly 3/5.  Skin: Skin is warm and dry.  Multiple ecchymotic areas right forearm, left flank and area on abdomen, bruises right arm also. Dry skin right foot. L-AKA with minimal sero-sanguinous drainage stil. Wound well approximated.    Assessment/Plan: 1. 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 Physiatrist is providing close team supervision and 24 hour  management of active medical problems listed below. Physiatrist and rehab team continue to assess barriers to discharge/monitor patient progress toward functional and medical goals.  Pt is improving daily.  FIM: FIM - Bathing Bathing Steps Patient Completed: Chest;Right Arm;Left Arm;Abdomen;Front perineal area;Right upper leg;Buttocks;Right lower leg (including foot) Bathing: 4: Steadying assist  FIM - Upper Body Dressing/Undressing Upper body dressing/undressing steps patient completed: Thread/unthread right sleeve of pullover shirt/dresss;Thread/unthread left sleeve of pullover shirt/dress;Put head through opening of pull over shirt/dress;Pull shirt over trunk Upper body dressing/undressing: 5: Set-up assist to: Obtain clothing/put away FIM - Lower Body Dressing/Undressing Lower body dressing/undressing steps patient completed: Thread/unthread right underwear leg;Thread/unthread left underwear leg;Thread/unthread right pants leg;Thread/unthread left pants leg;Pull underwear up/down Lower body dressing/undressing: 3: Mod-Patient completed 50-74% of tasks  FIM - Toileting Toileting steps completed by patient: Performs perineal hygiene;Adjust clothing prior to toileting Toileting: 3: Mod-Patient completed 2 of 3 steps  FIM - Diplomatic Services operational officer Devices: Bedside commode;Sliding board Toilet Transfers: 4-To toilet/BSC: Min A (steadying Pt. > 75%);4-From toilet/BSC: Min A (steadying Pt. > 75%)  FIM - Banker Devices: Sliding board Bed/Chair Transfer: 4: Supine > Sit: Min A (steadying Pt. > 75%/lift 1 leg);4: Sit > Supine: Min A (steadying pt. > 75%/lift 1 leg);4: Bed > Chair or W/C: Min A (steadying Pt. > 75%);4: Chair or W/C > Bed: Min A (steadying Pt. > 75%)  FIM - Locomotion: Wheelchair Locomotion: Wheelchair: 5: Travels 150 ft or more: maneuvers on rugs and over door sills with supervision, cueing or coaxing FIM -  Locomotion: Ambulation Ambulation/Gait Assistance: 1: +2 Total  assist (unable to take any steps) Locomotion: Ambulation: 0: Activity did not occur (pt unable at this time)  Comprehension Comprehension Mode: Auditory Comprehension: 5-Understands complex 90% of the time/Cues < 10% of the time  Expression Expression Mode: Verbal Expression: 5-Expresses complex 90% of the time/cues < 10% of the time  Social Interaction Social Interaction: 7-Interacts appropriately with others - No medications needed.  Problem Solving Problem Solving: 5-Solves complex 90% of the time/cues < 10% of the time  Memory Memory: 5-Requires cues to use assistive device  Medical Problem List and Plan:  1. PE/BLE DVT Prophylaxis/Anticoagulation: Pharmaceutical: Coumadin  2. Pain Management: prn medications effective. Will change baclofen to flexeril as at home.  3. Mood: no signs of distress noted.   4. Neuropsych: This patient is capable of making decisions on his/her own behalf.  5. NSVT: Coreg dose increased  . Monitor for symptoms of recurrence.  6. Acute renal failure: Improving. Recheck lytes stable to improved.  Avoid nephrotoxic drugs. Off ACE/NSAIDS.   -recheck next week 7. BPH: continue avodart. Denies voiding problems  8. Systolic Heart Failure: continue to monitor I's/O's, weights.     LOS (Days) 4 A FACE TO FACE EVALUATION WAS PERFORMED  Dhyan Noah T 01/14/2012, 8:00 AM

## 2012-01-14 NOTE — Progress Notes (Signed)
ANTICOAGULATION CONSULT NOTE - Follow Up Consult  Pharmacy Consult: Coumadin Indication:  B/L PE + right DVT  No Known Allergies  Patient Measurements: Height: 5\' 8"  (172.7 cm) Weight: 212 lb 1.3 oz (96.2 kg) IBW/kg (Calculated) : 68.4   Vital Signs: Temp: 98.6 F (37 C) (11/01 0546) Temp src: Oral (11/01 0546) BP: 120/78 mmHg (11/01 0818) Pulse Rate: 68  (11/01 0546)  Labs:  Basename 01/14/12 0620 01/13/12 0605 01/12/12 0620  HGB -- 10.3* --  HCT -- 31.9* --  PLT -- 207 --  APTT -- -- --  LABPROT 21.2* 25.1* 31.4*  INR 1.92* 2.41* 3.25*  HEPARINUNFRC -- -- --  CREATININE -- -- --  CKTOTAL -- -- --  CKMB -- -- --  TROPONINI -- -- --    Estimated Creatinine Clearance: 44.7 ml/min (by C-G formula based on Cr of 1.68).   Assessment: 80 YOM with B/L PE and right DVT to continue on Coumadin.  His INR is now subtherapeutic s/p dose held on 10/30, suspect may continue to decrease before leveling off again. Will try to avoid aggressive dosing to avoid sudden bump in INR. CBC is stable, no bleeding reported.  Goal of Therapy:  INR 2-3   Plan:  - Coumadin 6mg  PO x 1 today - Will f/up daily INR  Estella Husk, Pharm.D., BCPS Clinical Pharmacist  Phone [phone] Pager [phone] 01/14/2012, 2:14 PM

## 2012-01-14 NOTE — Progress Notes (Signed)
Physical Therapy Session Note  Patient Details  Name: Kevin [name] MRN: [id number] Date of Birth: 1940-07-15  Today's Date: 01/14/2012 Time: 0915-1010 Time Calculation (min): 55 min  Short Term Goals: Week 1:  PT Short Term Goal 1 (Week 1): Pt will perform bed mobility with min assist PT Short Term Goal 2 (Week 1): Pt will perform bed <> chair transfer with min assist PT Short Term Goal 3 (Week 1): Pt will perform sit <> stand with max assist PT Short Term Goal 4 (Week 1): Pt will have HEP started and report compliance with performance.   Skilled Therapeutic Interventions/Progress Updates:    Session primarily focused on sit <> standing. Worked on sit <> stand then standing tolerance in parallel bars max progressing to min assist. Pt required cues for pre-stand positioning, facilitation of glutes to promote upright posture and hip extension.  Introduced RW and progressed to sit <> stand from wheelchair to RW outside of parallel bars mod/max assist. Assist primarily needed for transition of Rt. Hand to RW. Resisted Bil. UE exercises with orange band: bil. Shoulder external rotation, rows for scapular retraction 2 x 10 reps each cues for upright posture with each.   *Reviewed pressure relief while in chair (pt had forgotten).   Therapy Documentation Precautions:  Precautions Precautions: Fall Restrictions Weight Bearing Restrictions: Yes Pain: Pain Assessment Pain Assessment: No/denies pain  See FIM for current functional status  Therapy/Group: Individual Therapy  Wilhemina Bonito 01/14/2012, 12:26 PM

## 2012-01-14 NOTE — Progress Notes (Signed)
Physical Therapy Session Note  Patient Details  Name: Kevin [name] MRN: [id number] Date of Birth: [birth year]  Today's Date: 01/14/2012 Time: 1100-1144 Time Calculation (min): 44 min  Short Term Goals: Week 1:  PT Short Term Goal 1 (Week 1): Pt will perform bed mobility with min assist PT Short Term Goal 2 (Week 1): Pt will perform bed <> chair transfer with min assist PT Short Term Goal 3 (Week 1): Pt will perform sit <> stand with max assist PT Short Term Goal 4 (Week 1): Pt will have HEP started and report compliance with performance.   Skilled Therapeutic Interventions/Progress Updates:  WC propulsion for endurance >150' x2 with supervision.  Patient required occasional cuing to maintain safe distances from objects to prevent collision with elbows.  Patient declined to perform any standing activities during this session due to having performed standing activities earlier this morning.  Patient performed 5 minutes x2 on UE ergometer for endurance with 4 minute rest break at RPE of 13.  Worked on improved triceps strengthening to improve assist for sit/stand transfers through resisted rows, 10 reps x2, triceps extensions 10 reps x2 with 3 pound weights.  All exercises performed with 30 second rest between sets.  Patient performed slide board transfers WC/mat table with min A for set up and cuing to remove leg rest, get board appropriately under hip, reminder to keep fingers out from under board.  Patient performed actual slide with supervision.      Therapy Documentation Precautions:  Precautions Precautions: Fall Restrictions Weight Bearing Restrictions: Yes   Pain: Pain Assessment Pain Assessment: 0-10 Pain Score:   5 Pain Type: Acute pain Pain Location: Groin Pain Orientation: Left Pain Descriptors: Aching Pain Onset: On-going Pain Intervention(s): Repositioned;RN made aware Multiple Pain Sites: No  See FIM for current functional status  Therapy/Group: Individual  Therapy Rexene Agent 01/14/2012, 12:38 PM

## 2012-01-15 ENCOUNTER — Inpatient Hospital Stay (HOSPITAL_COMMUNITY): Payer: Medicare Other | Admitting: Physical Therapy

## 2012-01-15 ENCOUNTER — Inpatient Hospital Stay (HOSPITAL_COMMUNITY): Payer: Medicare Other | Admitting: Occupational Therapy

## 2012-01-15 ENCOUNTER — Inpatient Hospital Stay (HOSPITAL_COMMUNITY): Payer: Medicare Other

## 2012-01-15 LAB — PROTIME-INR: INR: 1.94 — ABNORMAL HIGH (ref 0.00–1.49)

## 2012-01-15 MED ORDER — WARFARIN SODIUM 6 MG PO TABS
6.0000 mg | ORAL_TABLET | Freq: Once | ORAL | Status: AC
  Administered 2012-01-15: 6 mg via ORAL
  Filled 2012-01-15: qty 1

## 2012-01-15 NOTE — Progress Notes (Signed)
Occupational Therapy Session Note  Patient Details  Name: Kevin [name] MRN: [id number] Date of Birth: [birth year]  Today's Date: 01/15/2012 Time: 0865-7846 Time Calculation (min): 45 min  Short Term Goals: Week 1:  OT Short Term Goal 1 (Week 1): LB bath in sit and stand with Mod assist OT Short Term Goal 2 (Week 1): LB dressing in sit and stand with Mod assist OT Short Term Goal 3 (Week 1): Scoot transfers to and from drop arm commode with min assist OT Short Term Goal 4 (Week 1): Toileting with mod assist to include lateral leans OT Short Term Goal 5 (Week 1): BUE exercises 4/7 days   Skilled Therapeutic Interventions/Progress Updates:    Pt seen to address toileting skills. Pt stated that he was most comfortable using the wide drop arm commode chair but wanted to try it over the toilet.  The [facility] was raised to fit over the toilet.  The patient was able to transfer on/off the [facility] with min assist to supervision, but seat was too high for him to effectively manage his clothing.  He needed mod assist with clothing management.  BSC then lowered to original height and discussed with patient about using it in bathroom, just not over the toilet. Pt then worked on UE exercises with weighted dowel bar to increase shoulder strength.  Therapy Documentation Precautions:  Precautions Precautions: Fall Restrictions Weight Bearing Restrictions: Yes  Pain: Pain Assessment Pain Assessment: No/denies pain  Therapy/Group: Individual Therapy  SAGUIER,JULIA 01/15/2012, 4:17 PM

## 2012-01-15 NOTE — Progress Notes (Addendum)
Occupational Therapy Session Note  Patient Details  Name: Kevin [name] MRN: [id number] Date of Birth: 19-Nov-1940  Today's Date: 01/15/2012 Time: 1000-1055 Time Calculation (min): 55 min  Short Term Goals: Week 1:  OT Short Term Goal 1 (Week 1): LB bath in sit and stand with Mod assist OT Short Term Goal 2 (Week 1): LB dressing in sit and stand with Mod assist OT Short Term Goal 3 (Week 1): Scoot transfers to and from drop arm commode with min assist OT Short Term Goal 4 (Week 1): Toileting with mod assist to include lateral leans OT Short Term Goal 5 (Week 1): BUE exercises 4/7 days   Skilled Therapeutic Interventions/Progress Updates:  ADL re-training completed edge of bed this AM. Session with focus on sitting balance, bathing, dressing, and functional transfers with slideboard. Pt required set-up of bathing and clothing items and completed bathing and dressing with supervision level. No safety concerns during ADL. Pt wanted to steady self with hospital table and asked therapist to hold it steady so it wouldn't move on him.  W/c push-ups to increase Bil UE strength; 2 sets; 10 reps.  Therapy Documentation Precautions:  Precautions Precautions: Fall Restrictions Weight Bearing Restrictions: Yes NWB LLE Pain: Pain Assessment Pain Assessment: No/denies pain Pain Score:     See FIM for current functional status  Therapy/Group: Individual Therapy  Limmie Patricia, OTR/L 01/15/2012, 10:33 AM

## 2012-01-15 NOTE — Progress Notes (Signed)
Subjective/Complaints: No complaints. Getting stronger. Slept well.    A 12 point review of systems has been performed and if not noted above is otherwise negative.   Objective: Vital Signs: Blood pressure 141/81, pulse 64, temperature 98.3 F (36.8 C), temperature source Oral, resp. rate 18, height 5\' 8"  (1.727 m), weight 96.2 kg (212 lb 1.3 oz), SpO2 99.00%. No results found.  Basename 01/13/12 0605  WBC 7.2  HGB 10.3*  HCT 31.9*  PLT 207   No results found for this basename: NA:2,K:2,CL:2,CO2:2,GLUCOSE:2,BUN:2,CREATININE:2,CALCIUM:2 in the last 72 hours CBG (last 3)  No results found for this basename: GLUCAP:3 in the last 72 hours  Wt Readings from Last 3 Encounters:  01/10/12 96.2 kg (212 lb 1.3 oz)  01/10/12 96.299 kg (212 lb 4.8 oz)  01/10/12 96.299 kg (212 lb 4.8 oz)    Physical Exam:   .  Constitutional: He is oriented to person, place, and time. He appears well-developed and well-nourished.  HENT:  Head: Normocephalic and atraumatic.  Eyes: Pupils are equal, round, and reactive to light.  Neck: Normal range of motion.  Cardiovascular: Normal rate and regular rhythm.  Pulmonary/Chest: Effort normal and breath sounds normal.  Abdominal: Soft. Bowel sounds are normal.  Musculoskeletal: He exhibits edema (moderate edema L-AKA).  Neurological: He is alert and oriented to person, place, and time.  Follows basic commands without difficulty. Strength grossly 4/5 ue's. RLE is 3-4/5 lle is limited due to pain, grossly 3/5.  Skin: Skin is warm and dry.  Multiple ecchymotic areas right forearm, left flank and area on abdomen, bruises right arm also. Dry skin right foot. L-AKA with minimal sero-sanguinous drainage stil. Wound well approximated.    Assessment/Plan: 1. 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 Physiatrist is providing close team supervision and 24 hour  management of active medical problems listed below. Physiatrist and rehab team continue to assess barriers to discharge/monitor patient progress toward functional and medical goals.  Pt is improving daily.  FIM: FIM - Bathing Bathing Steps Patient Completed: Chest;Right Arm;Left Arm;Abdomen;Front perineal area;Right upper leg;Buttocks;Right lower leg (including foot) Bathing: 4: Steadying assist  FIM - Upper Body Dressing/Undressing Upper body dressing/undressing steps patient completed: Thread/unthread right sleeve of pullover shirt/dresss;Thread/unthread left sleeve of pullover shirt/dress;Put head through opening of pull over shirt/dress;Pull shirt over trunk Upper body dressing/undressing: 5: Set-up assist to: Obtain clothing/put away FIM - Lower Body Dressing/Undressing Lower body dressing/undressing steps patient completed: Thread/unthread right pants leg;Thread/unthread left pants leg;Don/Doff right sock Lower body dressing/undressing: 3: Mod-Patient completed 50-74% of tasks  FIM - Toileting Toileting steps completed by patient: Performs perineal hygiene;Adjust clothing prior to toileting Toileting: 3: Mod-Patient completed 2 of 3 steps  FIM - Diplomatic Services operational officer Devices: Bedside commode;Sliding board Toilet Transfers: 4-To toilet/BSC: Min A (steadying Pt. > 75%);4-From toilet/BSC: Min A (steadying Pt. > 75%)  FIM - Banker Devices: Sliding board Bed/Chair Transfer: 4: Bed > Chair or W/C: Min A (steadying Pt. > 75%)  FIM - Locomotion: Wheelchair Locomotion: Wheelchair: 5: Travels 150 ft or more: maneuvers on rugs and over door sills with supervision, cueing or coaxing FIM - Locomotion: Ambulation Ambulation/Gait Assistance: 1: +2 Total assist (unable to take any steps) Locomotion: Ambulation: 0: Activity did not occur (pt unable at this time)  Comprehension Comprehension Mode: Auditory Comprehension:  5-Understands basic 90% of the time/requires cueing < 10% of the time  Expression Expression  Mode: Verbal Expression: 5-Expresses basic needs/ideas: With no assist  Social Interaction Social Interaction: 7-Interacts appropriately with others - No medications needed.  Problem Solving Problem Solving: 5-Solves basic 90% of the time/requires cueing < 10% of the time  Memory Memory: 5-Recognizes or recalls 90% of the time/requires cueing < 10% of the time  Medical Problem List and Plan:  1. PE/BLE DVT Prophylaxis/Anticoagulation: Pharmaceutical: Coumadin  2. Pain Management: prn medications effective. Will change baclofen to flexeril as at home.  3. Mood: no signs of distress noted.   4. Neuropsych: This patient is capable of making decisions on his/her own behalf.  5. NSVT: Coreg dose increased  . Monitor for symptoms of recurrence.  6. Acute renal failure: Improving. Recheck lytes stable to improved.  Avoid nephrotoxic drugs. Off ACE/NSAIDS.   -recheck monday 7. BPH: continue avodart. Denies voiding problems  8. Systolic Heart Failure: continue to monitor I's/O's, weights.   Check today    LOS (Days) 5 A FACE TO FACE EVALUATION WAS PERFORMED  SWARTZ,ZACHARY T 01/15/2012, 6:49 AM

## 2012-01-15 NOTE — Progress Notes (Signed)
ANTICOAGULATION CONSULT NOTE - Follow Up Consult  Pharmacy Consult: Coumadin Indication:  B/L PE + right DVT  No Known Allergies  Patient Measurements: Height: 5\' 8"  (172.7 cm) Weight: 212 lb 1.3 oz (96.2 kg) IBW/kg (Calculated) : 68.4   Vital Signs: Temp: 98.3 F (36.8 C) (11/02 0600) Temp src: Oral (11/02 0600) BP: 119/72 mmHg (11/02 0600) Pulse Rate: 67  (11/02 0600)  Labs:  Basename 01/15/12 0630 01/14/12 0620 01/13/12 0605  HGB -- -- 10.3*  HCT -- -- 31.9*  PLT -- -- 207  APTT -- -- --  LABPROT 21.4* 21.2* 25.1*  INR 1.94* 1.92* 2.41*  HEPARINUNFRC -- -- --  CREATININE -- -- --  CKTOTAL -- -- --  CKMB -- -- --  TROPONINI -- -- --    Estimated Creatinine Clearance: 44.7 ml/min (by C-G formula based on Cr of 1.68).   Assessment: 38 YOM with B/L PE and right DVT to continue on Coumadin.  His INR is now subtherapeutic s/p dose held on 10/30, and has leveled off.  Goal of Therapy:  INR 2-3   Plan:  - Coumadin 6mg  PO x 1 today - Will f/up daily INR  Estella Husk, Pharm.D., BCPS Clinical Pharmacist  Phone [phone] Pager [phone] 01/15/2012, 12:10 PM

## 2012-01-15 NOTE — Progress Notes (Signed)
Physical Therapy Note  Patient Details  Name: Kevin [name] MRN: [id number] Date of Birth: 1940-03-31 Today's Date: 01/15/2012  1000-1055 (55 minutes) individual Pain : 6/10 LT AKA/premedicated Focus of treatment: wc mobility training/endurance ; bilateral LE (including LT AKA exercises) AROM/ strengthening; sit to stand/standing tolerance Treatment: wc mobility SBA unit 150 feet; transfers sliding board SBA + assist for correct placement of board  + assist with RT Legrest; sit <> supine (mat) SBA; LT LE - hip flexion/extension 2 x 15; hip abduction X 15; RT LE ankle pumps X 15; heel slides X 15 ; SAQs X 20 , hip abduction X 15 ; push up blocks 2 X 10; sit to stand from edge of mat min/mod assist ; standing X 2 to RW min assist X 1 minute ; stepping forward/backward with RW x 3 - 4 steps mod assist with max tactile cues to maintain UE elbow extension.    4098-1191 (30 minutes) individual Pain: no complaint of pain Focus of treatment: pregait with RW Treatment: Stepping forward/backward RT LE with RW support mod assist X 5 reps. Pts UEs quickly fatigues. Wheelchair pushups 2 X 5.  Kevin [name],JIM 01/15/2012, 7:55 AM

## 2012-01-16 ENCOUNTER — Inpatient Hospital Stay (HOSPITAL_COMMUNITY): Payer: Medicare Other | Admitting: Occupational Therapy

## 2012-01-16 LAB — CBC
HCT: 33.3 % — ABNORMAL LOW (ref 39.0–52.0)
Hemoglobin: 10.6 g/dL — ABNORMAL LOW (ref 13.0–17.0)
MCH: 27.7 pg (ref 26.0–34.0)
MCHC: 31.8 g/dL (ref 30.0–36.0)
RDW: 19.9 % — ABNORMAL HIGH (ref 11.5–15.5)

## 2012-01-16 LAB — BASIC METABOLIC PANEL
BUN: 39 mg/dL — ABNORMAL HIGH (ref 6–23)
CO2: 21 mEq/L (ref 19–32)
Chloride: 100 mEq/L (ref 96–112)
GFR calc Af Amer: 46 mL/min — ABNORMAL LOW (ref >90)
GFR calc non Af Amer: 40 mL/min — ABNORMAL LOW (ref >90)

## 2012-01-16 LAB — PROTIME-INR: INR: 2.44 — ABNORMAL HIGH (ref 0.00–1.49)

## 2012-01-16 MED ORDER — WARFARIN SODIUM 2.5 MG PO TABS
2.5000 mg | ORAL_TABLET | Freq: Once | ORAL | Status: AC
  Administered 2012-01-16: 2.5 mg via ORAL
  Filled 2012-01-16: qty 1

## 2012-01-16 NOTE — Progress Notes (Signed)
Occupational Therapy Session Note  Patient Details  Name: Kevin [name] MRN: [id number] Date of Birth: Mar 09, 1941  Today's Date: 01/16/2012 Time:0900  - 0948=48 minutes   Skilled Therapeutic Interventions/Progress Updates: Patient needed to toilet upon approach for therapy so patient completed toilet transfer via drop arm 3:1 commode and completed ADL sitting on 3:1; focus on session today was bed to drop arm 3:1 transfer (Mod A x2 scoot pivot- 2nd person to stabilize 3:1) and drop arm 3:1 to w/c sliding board with Min A;   Patient did a very clear job of explaining to this clinician and nurse how he previously transferred with his primary therapists.  Patient with good safety awareness for transfers  Also patient able to complete lateral leans to cleanse after toileting; also able to sit to squat/stand supporting self on 3:1 arms with close S while clinician pulled up pants that were stuck on 3:1;  Patient also initiated asking this clinician for low tredded sock rather than one that came up high on his right lower leg in order to increase skin integrity and healing to preserve right lower extremity:  Great carryover      Therapy Documentation Precautions:  Precautions Precautions: Fall Restrictions Weight Bearing Restrictions: Yes   Pain: 6/10 constant in residual limb incision site   See FIM for current functional status  Therapy/Group: Individual Therapy  Bud Face [facility] 01/16/2012, 3:12 PM

## 2012-01-16 NOTE — Progress Notes (Signed)
Subjective/Complaints: No complaints. Getting stronger. Slept well. No pain    A 12 point review of systems has been performed and if not noted above is otherwise negative.   Objective: Vital Signs: Blood pressure 108/68, pulse 56, temperature 98.6 F (37 C), temperature source Oral, resp. rate 18, height 5\' 8"  (1.727 m), weight 96.2 kg (212 lb 1.3 oz), SpO2 97.00%. No results found. No results found for this basename: WBC:2,HGB:2,HCT:2,PLT:2 in the last 72 hours No results found for this basename: NA:2,K:2,CL:2,CO2:2,GLUCOSE:2,BUN:2,CREATININE:2,CALCIUM:2 in the last 72 hours CBG (last 3)  No results found for this basename: GLUCAP:3 in the last 72 hours  Wt Readings from Last 3 Encounters:  01/10/12 96.2 kg (212 lb 1.3 oz)  01/10/12 96.299 kg (212 lb 4.8 oz)  01/10/12 96.299 kg (212 lb 4.8 oz)    Physical Exam:   .  Constitutional: He is oriented to person, place, and time. He appears well-developed and well-nourished.  HENT:  Head: Normocephalic and atraumatic.  Eyes: Pupils are equal, round, and reactive to light.  Neck: Normal range of motion.  Cardiovascular: Normal rate and regular rhythm.  Pulmonary/Chest: Effort normal and breath sounds normal.  Abdominal: Soft. Bowel sounds are normal.  Musculoskeletal: He exhibits edema (moderate edema L-AKA).  Neurological: He is alert and oriented to person, place, and time.  Follows basic commands without difficulty. Strength grossly 4/5 ue's. RLE is 3-4/5 lle is limited due to pain, grossly 3/5.  Skin: Skin is warm and dry.  Multiple ecchymotic areas right forearm, left flank and area on abdomen, bruises right arm also. Dry skin right foot. L-AKA with minimal sero-sanguinous drainage stil. Wound well approximated.    Assessment/Plan: 1. 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 Physiatrist is providing close team  supervision and 24 hour management of active medical problems listed below. Physiatrist and rehab team continue to assess barriers to discharge/monitor patient progress toward functional and medical goals.  Pt is improving daily.  FIM: FIM - Bathing Bathing Steps Patient Completed: Chest;Right Arm;Left Arm;Abdomen;Front perineal area;Right upper leg;Buttocks;Right lower leg (including foot) Bathing: 5: Supervision: Safety issues/verbal cues  FIM - Upper Body Dressing/Undressing Upper body dressing/undressing steps patient completed: Thread/unthread right sleeve of pullover shirt/dresss;Thread/unthread left sleeve of pullover shirt/dress;Put head through opening of pull over shirt/dress;Pull shirt over trunk Upper body dressing/undressing: 5: Set-up assist to: Obtain clothing/put away FIM - Lower Body Dressing/Undressing Lower body dressing/undressing steps patient completed: Thread/unthread right pants leg;Thread/unthread left pants leg;Don/Doff right sock Lower body dressing/undressing: 4: Min-Patient completed 75 plus % of tasks  FIM - Toileting Toileting steps completed by patient: Performs perineal hygiene;Adjust clothing prior to toileting Toileting: 3: Mod-Patient completed 2 of 3 steps  FIM - Diplomatic Services operational officer Devices: Bedside commode Toilet Transfers: 4-To toilet/BSC: Min A (steadying Pt. > 75%);4-From toilet/BSC: Min A (steadying Pt. > 75%)  FIM - Banker Devices: Sliding board Bed/Chair Transfer: 5: Bed > Chair or W/C: Supervision (verbal cues/safety issues)  FIM - Locomotion: Wheelchair Locomotion: Wheelchair: 5: Travels 150 ft or more: maneuvers on rugs and over door sills with supervision, cueing or coaxing FIM - Locomotion: Ambulation Ambulation/Gait Assistance: 1: +2 Total assist (unable to take any steps) Locomotion: Ambulation: 0: Activity did not occur (pt unable at this  time)  Comprehension Comprehension Mode: Auditory Comprehension: 5-Understands basic 90% of the time/requires cueing < 10% of the time  Expression Expression Mode: Verbal Expression:  5-Expresses basic needs/ideas: With extra time/assistive device  Social Interaction Social Interaction: 7-Interacts appropriately with others - No medications needed.  Problem Solving Problem Solving: 5-Solves basic 90% of the time/requires cueing < 10% of the time  Memory Memory: 5-Recognizes or recalls 90% of the time/requires cueing < 10% of the time  Medical Problem List and Plan:  1. PE/BLE DVT Prophylaxis/Anticoagulation: Pharmaceutical: Coumadin  2. Pain Management: prn medications effective. Will change baclofen to flexeril as at home.  3. Mood: no signs of distress noted.   4. Neuropsych: This patient is capable of making decisions on his/her own behalf.  5. NSVT: Coreg dose increased  . Monitor for symptoms of recurrence.  6. Acute renal failure: Improving. Recheck lytes stable to improved.  Avoid nephrotoxic drugs. Off ACE/NSAIDS.   -recheck monday 7. BPH: continue avodart. Denies voiding problems  8. Systolic Heart Failure: continue to monitor I's/O's, weights.   Needs weights checked more regularly    LOS (Days) 6 A FACE TO FACE EVALUATION WAS PERFORMED  Pheobe Sandiford T 01/16/2012, 6:46 AM

## 2012-01-16 NOTE — Progress Notes (Signed)
ANTICOAGULATION CONSULT NOTE - Follow Up Consult  Pharmacy Consult: Coumadin Indication:  B/L PE + right DVT  No Known Allergies  Patient Measurements: Height: 5\' 8"  (172.7 cm) Weight: 212 lb 1.3 oz (96.2 kg) IBW/kg (Calculated) : 68.4   Vital Signs: Temp: 98.6 F (37 C) (11/03 0529) Temp src: Oral (11/03 0529) BP: 108/68 mmHg (11/03 0529) Pulse Rate: 56  (11/03 0529)  Labs:  Basename 01/16/12 0640 01/15/12 0630 01/14/12 0620  HGB 10.6* -- --  HCT 33.3* -- --  PLT 237 -- --  APTT -- -- --  LABPROT 25.4* 21.4* 21.2*  INR 2.44* 1.94* 1.92*  HEPARINUNFRC -- -- --  CREATININE 1.65* -- --  CKTOTAL -- -- --  CKMB -- -- --  TROPONINI -- -- --    Estimated Creatinine Clearance: 45.5 ml/min (by C-G formula based on Cr of 1.65).   Assessment: 57 YOM with B/L PE and right DVT to continue on Coumadin.  His INR has now increased significantly after 6mg  x 3 days.  Goal of Therapy:  INR 2-3   Plan:  Decrease Coumadin to 2.5mg  today Daily PT/INR  Estella Husk, Pharm.D., BCPS Clinical Pharmacist  Phone [phone] Pager [phone] 01/16/2012, 11:06 AM

## 2012-01-17 ENCOUNTER — Inpatient Hospital Stay (HOSPITAL_COMMUNITY): Payer: Medicare Other | Admitting: Physical Therapy

## 2012-01-17 ENCOUNTER — Inpatient Hospital Stay (HOSPITAL_COMMUNITY): Payer: Medicare Other | Admitting: Occupational Therapy

## 2012-01-17 ENCOUNTER — Inpatient Hospital Stay (HOSPITAL_COMMUNITY): Payer: Medicare Other

## 2012-01-17 DIAGNOSIS — I2699 Other pulmonary embolism without acute cor pulmonale: Secondary | ICD-10-CM

## 2012-01-17 DIAGNOSIS — S78119A Complete traumatic amputation at level between unspecified hip and knee, initial encounter: Secondary | ICD-10-CM

## 2012-01-17 DIAGNOSIS — I70269 Atherosclerosis of native arteries of extremities with gangrene, unspecified extremity: Secondary | ICD-10-CM

## 2012-01-17 DIAGNOSIS — I509 Heart failure, unspecified: Secondary | ICD-10-CM

## 2012-01-17 DIAGNOSIS — N179 Acute kidney failure, unspecified: Secondary | ICD-10-CM

## 2012-01-17 LAB — CLOSTRIDIUM DIFFICILE BY PCR: Toxigenic C. Difficile by PCR: NEGATIVE

## 2012-01-17 LAB — PROTIME-INR
INR: 2.38 — ABNORMAL HIGH (ref 0.00–1.49)
Prothrombin Time: 24.9 seconds — ABNORMAL HIGH (ref 11.6–15.2)

## 2012-01-17 MED ORDER — SACCHAROMYCES BOULARDII 250 MG PO CAPS
250.0000 mg | ORAL_CAPSULE | Freq: Two times a day (BID) | ORAL | Status: DC
  Administered 2012-01-17 – 2012-01-24 (×14): 250 mg via ORAL
  Filled 2012-01-17 (×19): qty 1

## 2012-01-17 MED ORDER — WARFARIN SODIUM 5 MG PO TABS
5.0000 mg | ORAL_TABLET | Freq: Once | ORAL | Status: AC
  Administered 2012-01-17: 5 mg via ORAL
  Filled 2012-01-17: qty 1

## 2012-01-17 NOTE — Progress Notes (Signed)
Patient ID: Kevin [name], male   DOB: 1940/08/20, 71 y.o.   MRN: [id number] Comfortable. Sitting up in a wheelchair. Good spirits. His nurse re left above-knee amputation. I did not inspect his wound today. Continue rehabilitation. I will see him in the office in 4 weeks postop for staple removalports good healing of his

## 2012-01-17 NOTE — Progress Notes (Signed)
Occupational Therapy Note  Patient Details  Name: Kevin [name] MRN: [id number] Date of Birth: [birth year] Today's Date: 01/17/2012  Time: 1400-1430 Pt denies pain Individual Therapy  Pt in seated in w/c upon arrival.  Pt stated he was extremely fatigued after physical therapy session involving standing but agreeable to participating in session.  Pt rolled w/c to gym for BUE therex including UBE at level 5 for 5 mins X 2 with 3 min rest.  Pt engaged in w/c pushups to increase independence with sliding board transfers.   Lavone Neri [facility] 01/17/2012, 2:38 PM

## 2012-01-17 NOTE — Progress Notes (Signed)
ANTICOAGULATION CONSULT NOTE - Follow Up Consult  Pharmacy Consult: Coumadin Indication:  B/L PE + right DVT  No Known Allergies  Patient Measurements: Height: 5\' 8"  (172.7 cm) Weight: 212 lb 1.3 oz (96.2 kg) IBW/kg (Calculated) : 68.4   Vital Signs: Temp: 98.3 F (36.8 C) (11/04 0500) Temp src: Oral (11/04 0500) BP: 145/75 mmHg (11/04 0500) Pulse Rate: 68  (11/04 0740)  Labs:  Basename 01/17/12 0730 01/16/12 0640 01/15/12 0630  HGB -- 10.6* --  HCT -- 33.3* --  PLT -- 237 --  APTT -- -- --  LABPROT 24.9* 25.4* 21.4*  INR 2.38* 2.44* 1.94*  HEPARINUNFRC -- -- --  CREATININE -- 1.65* --  CKTOTAL -- -- --  CKMB -- -- --  TROPONINI -- -- --    Estimated Creatinine Clearance: 45.5 ml/min (by C-G formula based on Cr of 1.65).   Assessment: 19 YOM with B/L PE and right DVT to continue on Coumadin.  His INR is therapeutic.  Goal of Therapy:  INR 2-3   Plan:  Try Coumadin 5mg  today. Daily PT/INR  Estella Husk, Pharm.D., BCPS Clinical Pharmacist  Phone [phone] Pager [phone] 01/17/2012, 11:12 AM

## 2012-01-17 NOTE — Progress Notes (Signed)
Physical Therapy Session Note  Patient Details  Name: Kevin [name] MRN: [id number] Date of Birth: May 27, 1940  Today's Date: 01/17/2012 Time: 1300-1355 Time Calculation (min): 55 min  Short Term Goals: Week 1:  PT Short Term Goal 1 (Week 1): Pt will perform bed mobility with min assist PT Short Term Goal 2 (Week 1): Pt will perform bed <> chair transfer with min assist PT Short Term Goal 3 (Week 1): Pt will perform sit <> stand with max assist PT Short Term Goal 4 (Week 1): Pt will have HEP started and report compliance with performance.   Skilled Therapeutic Interventions/Progress Updates:    Pt desired to stay close to room in case of emergent bowel movement. Gait 2 x 8' with RW, min progressing to mod assist with fatigue. Wheelchair pushups 3 x 10 reps, pt encouraged to perform every hour when up in chair for pressure relief and upper body strengthening. Stand pivot transfers wheelchair <> bed with moderate assist, limited by fatigue. Cues for safe sequencing. Sliding board transfers wheelchair <> bed performed with overall min assist however pt still unable to place board on his own. Standing balance/tolerane activities working on one UE support while completing tasks on table, min assist and facilitation of glutes for improved hip extension. All sit <> stands min assist however pt still unsteady with transition of hand from wheelchair to walker.   Therapy Documentation Precautions:  Precautions Precautions: Fall Restrictions Weight Bearing Restrictions: Yes Pain:  6/10 residual limb pain, called for medication for post therapy.  See FIM for current functional status  Therapy/Group: Individual Therapy  Wilhemina Bonito 01/17/2012, 5:28 PM

## 2012-01-17 NOTE — Progress Notes (Signed)
Subjective/Complaints: Soft,loose stools. Eating a lot of fruit. No fever, pain controlled   A 12 point review of systems has been performed and if not noted above is otherwise negative.   Objective: Vital Signs: Blood pressure 145/75, pulse 93, temperature 98.3 F (36.8 C), temperature source Oral, resp. rate 20, height 5\' 8"  (1.727 m), weight 96.2 kg (212 lb 1.3 oz), SpO2 96.00%. No results found.  Basename 01/16/12 0640  WBC 6.6  HGB 10.6*  HCT 33.3*  PLT 237    Basename 01/16/12 0640  NA 131*  K 4.5  CL 100  CO2 21  GLUCOSE 110*  BUN 39*  CREATININE 1.65*  CALCIUM 8.4   CBG (last 3)  No results found for this basename: GLUCAP:3 in the last 72 hours  Wt Readings from Last 3 Encounters:  01/10/12 96.2 kg (212 lb 1.3 oz)  01/10/12 96.299 kg (212 lb 4.8 oz)  01/10/12 96.299 kg (212 lb 4.8 oz)    Physical Exam:   .  Constitutional: He is oriented to person, place, and time. He appears well-developed and well-nourished.  HENT:  Head: Normocephalic and atraumatic.  Eyes: Pupils are equal, round, and reactive to light.  Neck: Normal range of motion.  Cardiovascular: Normal rate and regular rhythm.  Pulmonary/Chest: Effort normal and breath sounds normal.  Abdominal: Soft. Bowel sounds are normal.  Musculoskeletal: He exhibits edema (moderate edema L-AKA).  Neurological: He is alert and oriented to person, place, and time.  Follows basic commands without difficulty. Strength grossly 4/5 ue's. RLE is 3-4/5 lle is limited due to pain, grossly 3/5.  Skin: Skin is warm and dry.  Multiple ecchymotic areas right forearm, left flank and area on abdomen, bruises right arm also. Dry skin right foot. L-AKA with minimal sero-sanguinous drainage stil. Wound well approximated.    Assessment/Plan: 1. 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 Physiatrist is providing close  team supervision and 24 hour management of active medical problems listed below. Physiatrist and rehab team continue to assess barriers to discharge/monitor patient progress toward functional and medical goals.  Pt is improving daily.  FIM: FIM - Bathing Bathing Steps Patient Completed: Chest;Right Arm;Left Arm;Abdomen;Front perineal area;Buttocks;Right upper leg;Left upper leg (required assist for R LE as pt sitting up high on 3:1) Bathing: 4: Min-Patient completes 8-9 54f 10 parts or 75+ percent  FIM - Upper Body Dressing/Undressing Upper body dressing/undressing steps patient completed: Thread/unthread right sleeve of pullover shirt/dresss;Thread/unthread left sleeve of pullover shirt/dress;Put head through opening of pull over shirt/dress;Pull shirt over trunk Upper body dressing/undressing: 5: Supervision: Safety issues/verbal cues FIM - Lower Body Dressing/Undressing Lower body dressing/undressing steps patient completed: Thread/unthread right pants leg;Thread/unthread left pants leg (wore no underwear; assist to unstick pantsfr 3:1 for pull up) Lower body dressing/undressing: 4: Min-Patient completed 75 plus % of tasks (wore no shoes or underwear today)  FIM - Toileting Toileting steps completed by patient: Adjust clothing prior to toileting;Performs perineal hygiene Toileting: 4: Steadying assist  FIM - Diplomatic Services operational officer Devices: Human resources officer Transfers: 1-Two helpers  FIM - Architectural technologist Transfer: 7: Supine > Sit: No assist;1: Two helpers  FIM - Locomotion: Wheelchair Locomotion: Wheelchair: 5: Travels 150 ft or more: maneuvers on rugs and over door sills with supervision, cueing or coaxing FIM - Locomotion: Ambulation Ambulation/Gait Assistance: 1: +2 Total assist (unable to take any steps) Locomotion:  Ambulation: 0: Activity did not occur (pt unable at this  time)  Comprehension Comprehension Mode: Auditory Comprehension: 5-Understands basic 90% of the time/requires cueing < 10% of the time  Expression Expression Mode: Verbal Expression: 5-Expresses basic needs/ideas: With extra time/assistive device  Social Interaction Social Interaction: 7-Interacts appropriately with others - No medications needed.  Problem Solving Problem Solving: 5-Solves basic 90% of the time/requires cueing < 10% of the time  Memory Memory: 5-Recognizes or recalls 90% of the time/requires cueing < 10% of the time  Medical Problem List and Plan:  1. PE/BLE DVT Prophylaxis/Anticoagulation: Pharmaceutical: Coumadin  2. Pain Management: prn medications effective. Will change baclofen to flexeril as at home.  3. Mood: no signs of distress noted.   4. Neuropsych: This patient is capable of making decisions on his/her own behalf.  5. NSVT: Coreg dose increased  . Monitor for symptoms of recurrence.  6. Acute renal failure: Improving. Recheck lytes stable to improved.  Avoid nephrotoxic drugs. Off ACE/NSAIDS.   -recheck monday 7. BPH: continue avodart. Denies voiding problems  8. Systolic Heart Failure: continue to monitor I's/O's, weights.   Needs weights checked more regularly 9. Loose stool-   -stool sent for c diff yet colace not held  -add probiotic  -pt states he's been eating a lot of fruit    LOS (Days) 7 A FACE TO FACE EVALUATION WAS PERFORMED  Kevin [name] 01/17/2012, 7:27 AM

## 2012-01-17 NOTE — Progress Notes (Signed)
Physical Therapy Session Note  Patient Details  Name: Kevin [name] MRN: [id number] Date of Birth: 29-Sep-1940  Today's Date: 01/17/2012 Time: 0730-0828 Time Calculation (min): 58 min  Short Term Goals: Week 1:  PT Short Term Goal 1 (Week 1): Pt will perform bed mobility with min assist PT Short Term Goal 2 (Week 1): Pt will perform bed <> chair transfer with min assist PT Short Term Goal 3 (Week 1): Pt will perform sit <> stand with max assist PT Short Term Goal 4 (Week 1): Pt will have HEP started and report compliance with performance.   Skilled Therapeutic Interventions/Progress Updates:    Pt fitted with new wheelchair as current wheelchair had hardware malfunction. Sit <> stand from bed with heavy min assist cues for safe hand placement. Stand pivot transfers bed > bedside commode > wheelchair with mod assist pt with difficulty completing full turn secondary to bil. UE fatigue. Flexed hips/trunk throughout transfer. Multiple sit <> stands with min progressing to mod assist with fatigue while pulling up underwear and pants. Gait x 8' with RW min/mod assist.   Extra rest needed today, pt fatigued from lack of sleep secondary to loose bowels.   Therapy Documentation Precautions:  Precautions Precautions: Fall Restrictions Weight Bearing Restrictions: Yes Pain:  6/10 residual limb pain, premedicated   See FIM for current functional status  Therapy/Group: Individual Therapy  Wilhemina Bonito 01/17/2012, 12:24 PM

## 2012-01-17 NOTE — Progress Notes (Signed)
Occupational Therapy Session Note  Patient Details  Name: Kevin [name] MRN: [id number] Date of Birth: [birth year]  Today's Date: 01/17/2012 Time: 4098-1191 Time Calculation (min): 45 min  Short Term Goals: Week 1:  OT Short Term Goal 1 (Week 1): LB bath in sit and stand with Mod assist OT Short Term Goal 2 (Week 1): LB dressing in sit and stand with Mod assist OT Short Term Goal 3 (Week 1): Scoot transfers to and from drop arm commode with min assist OT Short Term Goal 4 (Week 1): Toileting with mod assist to include lateral leans OT Short Term Goal 5 (Week 1): BUE exercises 4/7 days   Skilled Therapeutic Interventions/Progress Updates:    Pt reports already bathing and dressing prior to session, requesting to complete shaving at sink.  Engaged in slide board transfers with min assist and pt demonstrating appropriate placement of slide board prior to transfer.  Required min cues for weight shifting forward with transfer.  Engaged in 3 reps of 10 block pushups with focus on BUE strengthening to assist with transfers.  Engaged in sit <> stand at high -low table with focus on standing balance and tolerance with participating in table top activity.  Pt tolerated standing approx 2 mins x 4 reps with cues for upright standing posture.  Therapy Documentation Precautions:  Precautions Precautions: Fall Restrictions Weight Bearing Restrictions: Yes General:   Vital Signs: Therapy Vitals Pulse Rate: 68  Pain: Pain Assessment Pain Assessment: 0-10 Pain Score:   6 Pain Type: Surgical pain Pain Location: Leg Pain Orientation: Left Pain Descriptors: Aching Pain Onset: With Activity Pain Intervention(s): Other (Comment) (premedicated)  See FIM for current functional status  Therapy/Group: Individual Therapy  Leonette Monarch 01/17/2012, 11:29 AM

## 2012-01-18 ENCOUNTER — Inpatient Hospital Stay (HOSPITAL_COMMUNITY): Payer: Medicare Other | Admitting: Physical Therapy

## 2012-01-18 ENCOUNTER — Inpatient Hospital Stay (HOSPITAL_COMMUNITY): Payer: Medicare Other

## 2012-01-18 ENCOUNTER — Inpatient Hospital Stay (HOSPITAL_COMMUNITY): Payer: Medicare Other | Admitting: Occupational Therapy

## 2012-01-18 DIAGNOSIS — N179 Acute kidney failure, unspecified: Secondary | ICD-10-CM

## 2012-01-18 DIAGNOSIS — I70269 Atherosclerosis of native arteries of extremities with gangrene, unspecified extremity: Secondary | ICD-10-CM

## 2012-01-18 DIAGNOSIS — I509 Heart failure, unspecified: Secondary | ICD-10-CM

## 2012-01-18 DIAGNOSIS — S78119A Complete traumatic amputation at level between unspecified hip and knee, initial encounter: Secondary | ICD-10-CM

## 2012-01-18 DIAGNOSIS — I2699 Other pulmonary embolism without acute cor pulmonale: Secondary | ICD-10-CM

## 2012-01-18 LAB — BASIC METABOLIC PANEL
BUN: 37 mg/dL — ABNORMAL HIGH (ref 6–23)
Calcium: 8.6 mg/dL (ref 8.4–10.5)
Creatinine, Ser: 1.77 mg/dL — ABNORMAL HIGH (ref 0.50–1.35)
GFR calc non Af Amer: 37 mL/min — ABNORMAL LOW (ref >90)
Glucose, Bld: 105 mg/dL — ABNORMAL HIGH (ref 70–99)

## 2012-01-18 MED ORDER — WARFARIN SODIUM 5 MG PO TABS
5.0000 mg | ORAL_TABLET | Freq: Once | ORAL | Status: AC
  Administered 2012-01-18: 5 mg via ORAL
  Filled 2012-01-18: qty 1

## 2012-01-18 NOTE — Progress Notes (Signed)
Physical Therapy Weekly Progress Note  Patient Details  Name: Kevin [name] MRN: [id number] Date of Birth: [birth year]  Today's Date: 01/18/2012 Time: 0730-0828 Time Calculation (min): 58 min  Patient has met 4 of 4 short term goals.  Although pt needs encouragement for performing all exercises in HEP.  Patient continues to demonstrate the following deficits: decreased functional mobility, increased need for assistance, decreased ability to place board during sliding board transfers, decreased standing tolerance, and elevated falls risk and therefore will continue to benefit from skilled PT intervention to enhance overall performance with activity tolerance, balance and ability to compensate for deficits.  Patient progressing toward long term goals..  Continue plan of care.  PT Short Term Goals Week 1:  PT Short Term Goal 1 (Week 1): Pt will perform bed mobility with min assist PT Short Term Goal 1 - Progress (Week 1): Met PT Short Term Goal 2 (Week 1): Pt will perform bed <> chair transfer with min assist PT Short Term Goal 2 - Progress (Week 1): Met PT Short Term Goal 3 (Week 1): Pt will perform sit <> stand with max assist PT Short Term Goal 3 - Progress (Week 1): Met PT Short Term Goal 4 (Week 1): Pt will have HEP started and report compliance with performance.  PT Short Term Goal 4 - Progress (Week 1): Met  Skilled Therapeutic Interventions/Progress Updates:    Sit > stand from bed with steady assist, stand pivot transfer to bedside commode with min assist, pt able to complete turn and pull down clothes prior to having to sit. Standing balance working on pt pulling up/down pants. Cues for lateral leans in sitting to get pants as high as possible prior to standing. Car transfer with sliding board then partial stand pivot without RW, using car frame for support (method pt used prior to admission). Pt continues to have difficulty placing board appropriately when in wheelchair, needed  cuing. Wheelchair propulsion on unit x 250' for generalized strength and conditioning.   Therapy Documentation Precautions:  Precautions Precautions: Fall Restrictions Weight Bearing Restrictions: Yes Pain: Pain Assessment Pain Assessment: 0-10 Pain Score:   5 Pain Type: Acute pain Pain Location: Leg Pain Orientation: Left Pain Descriptors: Spasm Pain Frequency: Occasional Pain Onset: Gradual Patients Stated Pain Goal: 2 Pain Intervention(s): Medication (See eMAR) (lexeril 5 mg po)  See FIM for current functional status  Therapy/Group: Individual Therapy  Wilhemina Bonito 01/18/2012, 12:20 PM

## 2012-01-18 NOTE — Progress Notes (Addendum)
Occupational Therapy Session Note  Patient Details  Name: LINDELL RENFREW MRN: [id number] Date of Birth: 1940-09-02  Today's Date: 01/18/2012  Time: 1000-1100 Time Calculation (min): 60 min  Short Term Goals: Week 1:  OT Short Term Goal 1 (Week 1): LB bath in sit and stand with Mod assist OT Short Term Goal 2 (Week 1): LB dressing in sit and stand with Mod assist OT Short Term Goal 3 (Week 1): Scoot transfers to and from drop arm commode with min assist OT Short Term Goal 4 (Week 1): Toileting with mod assist to include lateral leans OT Short Term Goal 5 (Week 1): BUE exercises 4/7 days   Skilled Therapeutic Interventions/Progress Updates:    Pt engaged in bathing and dressing tasks seated EOB.  Pt completed bathing buttocks and pulling up pants employing lateral leans.  Pt transferred to w/c with scoot transfer.  Pt rolled to ADL apartment to problem solve shower transfer after discharge before returning to room to practice w/c<>shower transfer.  Pt required min A with grab rails for squat pivot transfer.  Focus on activity tolerance, scoot transfers, squat pivot transfers, and safety awareness. Therapy Documentation Precautions:  Precautions Precautions: Fall Restrictions Weight Bearing Restrictions: Yes   Pain: Pain Assessment Pain Assessment: No/denies pain Pain Score:   8 Pain Type: Surgical pain Pain Location: Leg Pain Orientation: Left Pain Descriptors: Aching Pain Frequency: Intermittent Pain Onset: On-going Pain Intervention(s): Medication (See eMAR);Relaxation;Rest  See FIM for current functional status  Therapy/Group: Individual Therapy  [name] 01/18/2012, 11:03 AM

## 2012-01-18 NOTE — Progress Notes (Signed)
Subjective/Complaints: Stooling stopped. Pain under control  A 12 point review of systems has been performed and if not noted above is otherwise negative.   Objective: Vital Signs: Blood pressure 118/78, pulse 72, temperature 98.4 F (36.9 C), temperature source Oral, resp. rate 17, height 5\' 8"  (1.727 m), weight 92.2 kg (203 lb 4.2 oz), SpO2 97.00%. No results found.  Basename 01/16/12 0640  WBC 6.6  HGB 10.6*  HCT 33.3*  PLT 237    Basename 01/16/12 0640  NA 131*  K 4.5  CL 100  CO2 21  GLUCOSE 110*  BUN 39*  CREATININE 1.65*  CALCIUM 8.4   CBG (last 3)  No results found for this basename: GLUCAP:3 in the last 72 hours  Wt Readings from Last 3 Encounters:  01/18/12 92.2 kg (203 lb 4.2 oz)  01/10/12 96.299 kg (212 lb 4.8 oz)  01/10/12 96.299 kg (212 lb 4.8 oz)    Physical Exam:   .  Constitutional: He is oriented to person, place, and time. He appears well-developed and well-nourished.  HENT:  Head: Normocephalic and atraumatic.  Eyes: Pupils are equal, round, and reactive to light.  Neck: Normal range of motion.  Cardiovascular: Normal rate and regular rhythm.  Pulmonary/Chest: Effort normal and breath sounds normal.  Abdominal: Soft. Bowel sounds are normal.  Musculoskeletal: He exhibits edema (moderate edema L-AKA).  Neurological: He is alert and oriented to person, place, and time.  Follows basic commands without difficulty. Strength grossly 4/5 ue's. RLE is 3-4/5 lle is limited due to pain, grossly 3/5.  Skin: Skin is warm and dry.  Multiple ecchymotic areas right forearm, left flank and area on abdomen, bruises right arm also. Dry skin right foot. L-AKA with minimal sero-sanguinous drainage stil. Wound well approximated.    Assessment/Plan: 1. 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 Physiatrist is providing close team supervision and 24 hour  management of active medical problems listed below. Physiatrist and rehab team continue to assess barriers to discharge/monitor patient progress toward functional and medical goals.  Pt is improving daily.  FIM: FIM - Bathing Bathing Steps Patient Completed: Chest;Right Arm;Left Arm;Abdomen;Front perineal area;Buttocks;Right upper leg;Left upper leg (required assist for R LE as pt sitting up high on 3:1) Bathing: 4: Min-Patient completes 8-9 46f 10 parts or 75+ percent  FIM - Upper Body Dressing/Undressing Upper body dressing/undressing steps patient completed: Thread/unthread right sleeve of pullover shirt/dresss;Thread/unthread left sleeve of pullover shirt/dress;Put head through opening of pull over shirt/dress;Pull shirt over trunk Upper body dressing/undressing: 5: Supervision: Safety issues/verbal cues FIM - Lower Body Dressing/Undressing Lower body dressing/undressing steps patient completed: Thread/unthread right pants leg;Thread/unthread left pants leg (wore no underwear; assist to unstick pantsfr 3:1 for pull up) Lower body dressing/undressing: 4: Min-Patient completed 75 plus % of tasks (wore no shoes or underwear today)  FIM - Toileting Toileting steps completed by patient: Performs perineal hygiene Toileting: 2: Max-Patient completed 1 of 3 steps  FIM - Diplomatic Services operational officer Devices: Bedside commode Toilet Transfers: 3-To toilet/BSC: Mod A (lift or lower assist);3-From toilet/BSC: Mod A (lift or lower assist)  FIM - Bed/Chair Transfer Bed/Chair Transfer Assistive Devices: Manufacturing systems engineer Transfer: 5: Supine > Sit: Supervision (verbal cues/safety issues);3: Sit > Supine: Mod A (lifting assist/Pt. 50-74%/lift 2 legs);3: Bed > Chair or W/C: Mod A (lift or lower assist)  FIM - Locomotion: Wheelchair Locomotion: Wheelchair: 0: Activity did not occur (pt desired to stay  in room secondary to loose bowels) FIM - Locomotion: Ambulation Locomotion: Ambulation  Assistive Devices: Walker - Rolling Ambulation/Gait Assistance: 3: Mod assist Locomotion: Ambulation: 1: Travels less than 50 ft with moderate assistance (Pt: 50 - 74%)  Comprehension Comprehension Mode: Auditory Comprehension: 5-Understands complex 90% of the time/Cues < 10% of the time  Expression Expression Mode: Verbal Expression: 5-Expresses basic needs/ideas: With extra time/assistive device  Social Interaction Social Interaction: 7-Interacts appropriately with others - No medications needed.  Problem Solving Problem Solving: 5-Solves complex 90% of the time/cues < 10% of the time  Memory Memory: 5-Recognizes or recalls 90% of the time/requires cueing < 10% of the time  Medical Problem List and Plan:  1. PE/BLE DVT Prophylaxis/Anticoagulation: Pharmaceutical: Coumadin  2. Pain Management: prn medications effective. Will change baclofen to flexeril as at home.  3. Mood: no signs of distress noted.   4. Neuropsych: This patient is capable of making decisions on his/her own behalf.  5. NSVT: Coreg dose increased  . Monitor for symptoms of recurrence.  6. Acute renal failure:  Slight worsening yesterday .  Avoid nephrotoxic drugs. Off ACE/NSAIDS.  -hyponatremic yesterday  -repeat today given recent loose stool   7. BPH: continue avodart. Denies voiding problems  8. Systolic Heart Failure: continue to monitor I's/O's, weights.   Needs weights checked more regularly 9. Loose stool-   -resolved, c diff negative  -added probiotic  -pt has modified "fruit intake"    LOS (Days) 8 A FACE TO FACE EVALUATION WAS PERFORMED  SWARTZ,ZACHARY T 01/18/2012, 6:46 AM

## 2012-01-18 NOTE — Progress Notes (Signed)
ANTICOAGULATION CONSULT NOTE - Follow Up Consult  Pharmacy Consult: Coumadin Indication:  B/L PE + right DVT  No Known Allergies  Patient Measurements: Height: 5\' 8"  (172.7 cm) Weight: 203 lb 4.2 oz (92.2 kg) IBW/kg (Calculated) : 68.4   Vital Signs: Temp: 98.4 F (36.9 C) (11/05 0500) Temp src: Oral (11/05 0500) BP: 118/78 mmHg (11/05 0500) Pulse Rate: 72  (11/05 0500)  Labs:  Basename 01/18/12 0715 01/17/12 0730 01/16/12 0640  HGB -- -- 10.6*  HCT -- -- 33.3*  PLT -- -- 237  APTT -- -- --  LABPROT 23.7* 24.9* 25.4*  INR 2.23* 2.38* 2.44*  HEPARINUNFRC -- -- --  CREATININE 1.77* -- 1.65*  CKTOTAL -- -- --  CKMB -- -- --  TROPONINI -- -- --    Estimated Creatinine Clearance: 41.6 ml/min (by C-G formula based on Cr of 1.77).   Assessment: 59 YOM with B/L PE and right DVT to continue on Coumadin.  His INR remains therapeutic.  Goal of Therapy:  INR 2-3   Plan:  Coumadin 5mg  today. Daily PT/INR  Estella Husk, Pharm.D., BCPS Clinical Pharmacist  Phone [phone] Pager [phone] 01/18/2012, 10:00 AM

## 2012-01-18 NOTE — Progress Notes (Signed)
Occupational Therapy Session Note  Patient Details  Name: Kevin [name] MRN: [id number] Date of Birth: 08/28/1940  Today's Date: 01/18/2012 Time: 1330-1400 Time Calculation (min): 30 min  Short Term Goals: Week 1:  OT Short Term Goal 1 (Week 1): LB bath in sit and stand with Mod assist OT Short Term Goal 2 (Week 1): LB dressing in sit and stand with Mod assist OT Short Term Goal 3 (Week 1): Scoot transfers to and from drop arm commode with min assist OT Short Term Goal 4 (Week 1): Toileting with mod assist to include lateral leans OT Short Term Goal 5 (Week 1): BUE exercises 4/7 days   Skilled Therapeutic Interventions/Progress Updates:    1:1 focus on squat pivot tranfers w/c <> drop arm commode, lateral leans for clothing management (up and down) with encouragement. Pt able to accomplish increased bottom clearance with transfer.  Pt needed to lean to bilateral side multiple times to complete pulling up pants. Pt needed mod cuing for w/c setup with encouragement for simple problem solving. Pt with increase swelling in right LE - recommended pt elevate it and preform ankle pumps. Educated on right foot care - applying lotion and changing to a larger sock to decrease indentation in LE   Therapy Documentation Precautions:  Precautions Precautions: Fall Restrictions Weight Bearing Restrictions: Yes Pain: Pain Assessment Pain Assessment: 0-10 Pain Score:   6 Pain Type: Acute pain Pain Location: Leg Pain Orientation: Left Pain Descriptors: Aching Pain Frequency: Occasional Pain Onset: Gradual Patients Stated Pain Goal: 2 Pain Intervention(s): Medication (See eMAR) (oxycodone 10 mg po)  See FIM for current functional status  Therapy/Group: Individual Therapy  Roney Mans [facility] 01/18/2012, 3:31 PM

## 2012-01-18 NOTE — Progress Notes (Signed)
Physical Therapy Session Note  Patient Details  Name: Kevin [name] MRN: [id number] Date of Birth: 1940/06/04  Today's Date: 01/18/2012 Time: 7829-5621 Time Calculation (min): 43 min  Short Term Goals: Week 2:   = long term goals   Skilled Therapeutic Interventions/Progress Updates:    Wheelchair mobility for strengthening and conditioning as well as wheelchair management in community environment x 100', 200', 16' with mostly supervision, min assist for tight space negotiation and steep incline. Work on wheelchair set up and scoot pivot transfers to/from mat without sliding board, cues needed to remove leg rest. Side lying hip extension 2 x 10 reps. Pt has good buttock clearance however requires cues for safety/sequencing particularly with transfer back to wheelchair.   Therapy Documentation Precautions:  Precautions Precautions: Fall Restrictions Weight Bearing Restrictions: Yes Pain: Pain Assessment Pain Score:   6 Pain Type: Surgical pain Pain Location: Leg Pain Orientation: Left Pain Descriptors: Aching Pain Onset: On-going Pain Intervention(s): Repositioned (declined pain medication)  See FIM for current functional status  Therapy/Group: Individual Therapy  Wilhemina Bonito 01/18/2012, 5:22 PM

## 2012-01-19 ENCOUNTER — Inpatient Hospital Stay (HOSPITAL_COMMUNITY): Payer: Medicare Other

## 2012-01-19 ENCOUNTER — Inpatient Hospital Stay (HOSPITAL_COMMUNITY): Payer: Medicare Other | Admitting: Physical Therapy

## 2012-01-19 ENCOUNTER — Inpatient Hospital Stay (HOSPITAL_COMMUNITY): Payer: Medicare Other | Admitting: *Deleted

## 2012-01-19 DIAGNOSIS — I509 Heart failure, unspecified: Secondary | ICD-10-CM

## 2012-01-19 DIAGNOSIS — I70269 Atherosclerosis of native arteries of extremities with gangrene, unspecified extremity: Secondary | ICD-10-CM

## 2012-01-19 DIAGNOSIS — I2699 Other pulmonary embolism without acute cor pulmonale: Secondary | ICD-10-CM

## 2012-01-19 DIAGNOSIS — S78119A Complete traumatic amputation at level between unspecified hip and knee, initial encounter: Secondary | ICD-10-CM

## 2012-01-19 DIAGNOSIS — N179 Acute kidney failure, unspecified: Secondary | ICD-10-CM

## 2012-01-19 LAB — PROTIME-INR: Prothrombin Time: 23.3 seconds — ABNORMAL HIGH (ref 11.6–15.2)

## 2012-01-19 LAB — CBC
MCH: 26.9 pg (ref 26.0–34.0)
MCHC: 31 g/dL (ref 30.0–36.0)
Platelets: 228 10*3/uL (ref 150–400)

## 2012-01-19 MED ORDER — WARFARIN SODIUM 5 MG PO TABS
5.0000 mg | ORAL_TABLET | ORAL | Status: DC
  Administered 2012-01-20 – 2012-01-23 (×3): 5 mg via ORAL
  Filled 2012-01-19 (×4): qty 1

## 2012-01-19 MED ORDER — WARFARIN SODIUM 7.5 MG PO TABS
7.5000 mg | ORAL_TABLET | ORAL | Status: DC
  Administered 2012-01-19 – 2012-01-22 (×2): 7.5 mg via ORAL
  Filled 2012-01-19 (×2): qty 1

## 2012-01-19 NOTE — Progress Notes (Signed)
Recreational Therapy Session Note  Patient Details  Name: Kevin [name] MRN: [id number] Date of Birth: 06/20/1940 Today's Date: 01/19/2012 Time: 1400-1445 Pain: no c/o Skilled Therapeutic Interventions/Progress Updates:  Pt participated in group session with focus social support by peers (other pt's with amputations), While participating in therapeutic activities/ UE strengthening. Pt stated he enjoyed being able to meet other pt's going through the same circumstance and found it helpful to talk with them about the process.  Therapy/Group: Group Therapy   Morley Gaumer 01/19/2012, 5:16 PM

## 2012-01-19 NOTE — Progress Notes (Signed)
Occupational Therapy Weekly Progress Note  Patient Details  Name: ROBY DONAWAY MRN: [id number] Date of Birth: 1940-07-19  Today's Date: 01/19/2012  Patient has met 5 of 5 short term goals. Pt has made steady progress with bathing, dressing, toilet transfers and toileting, and shower transfers.  Pt is supervision for UB bathing and min for LB bathing at shower level and supervision for UB dressing and min A for LB dressing.  Pt performs drop arm BSC scoot transfers with supervision and squat pivot shower transfers with min A.  Pt requires min A for sit<>stand and steady A while standing to pull up pants.  Pt incorporates lateral leans in bed when dressing at bed level.     Patient continues to demonstrate the following deficits:muscle weakness, decreased cardiorespiratoy endurance and decreased sitting balance, decreased standing balance, and decreased balance strategies and therefore will continue to benefit from skilled OT intervention to enhance overall performance with BADL and Reduce care partner burden.  Patient progressing toward long term goals..  Continue plan of care.  OT Short Term Goals Week 1:  OT Short Term Goal 1 (Week 1): LB bath in sit and stand with Mod assist OT Short Term Goal 1 - Progress (Week 1): Met OT Short Term Goal 2 (Week 1): LB dressing in sit and stand with Mod assist OT Short Term Goal 2 - Progress (Week 1): Met OT Short Term Goal 3 (Week 1): Scoot transfers to and from drop arm commode with min assist OT Short Term Goal 3 - Progress (Week 1): Met OT Short Term Goal 4 (Week 1): Toileting with mod assist to include lateral leans OT Short Term Goal 4 - Progress (Week 1): Met OT Short Term Goal 5 (Week 1): BUE exercises 4/7 days  OT Short Term Goal 5 - Progress (Week 1): Met  Skilled Therapeutic Interventions/Progress Updates:    continue with POC  Therapy Documentation Precautions:  Precautions Precautions: Fall Restrictions Weight Bearing  Restrictions: Yes   ADL: ADL Grooming: Independent Where Assessed-Grooming: Sitting at sink Upper Body Bathing: Supervision/safety Where Assessed-Upper Body Bathing: Shower Lower Body Bathing: Minimal assistance Where Assessed-Lower Body Bathing: Shower Upper Body Dressing: Supervision/safety Where Assessed-Upper Body Dressing: Edge of bed Lower Body Dressing: Minimal assistance Where Assessed-Lower Body Dressing: Wheelchair Toileting: Moderate assistance Where Assessed-Toileting: Bedside Commode Toilet Transfer Equipment: Drop arm bedside commode Tub/Shower Transfer: Minimal assistance Tub/Shower Transfer Method: Squat pivot  See FIM for current functional status  Therapy/Group: Individual Therapy  [name] 01/19/2012, 1:24 PM

## 2012-01-19 NOTE — Progress Notes (Signed)
ANTICOAGULATION CONSULT NOTE - Follow Up Consult  Pharmacy Consult: Coumadin Indication:  B/L PE + right DVT  No Known Allergies  Patient Measurements: Height: 5\' 8"  (172.7 cm) Weight: 206 lb 9.1 oz (93.7 kg) IBW/kg (Calculated) : 68.4   Vital Signs: Temp: 98.2 F (36.8 C) (11/06 0500) Temp src: Oral (11/06 0500) BP: 120/77 mmHg (11/06 0500) Pulse Rate: 63  (11/06 0500)  Labs:  Basename 01/19/12 0625 01/18/12 0715 01/17/12 0730  HGB 10.8* -- --  HCT 34.8* -- --  PLT 228 -- --  APTT -- -- --  LABPROT 23.3* 23.7* 24.9*  INR 2.18* 2.23* 2.38*  HEPARINUNFRC -- -- --  CREATININE -- 1.77* --  CKTOTAL -- -- --  CKMB -- -- --  TROPONINI -- -- --    Estimated Creatinine Clearance: 41.9 ml/min (by C-G formula based on Cr of 1.77).   Assessment: 58 YOM with B/L PE and right DVT to continue on Coumadin.  His INR remains therapeutic but is trending down.  Goal of Therapy:  INR 2-3   Plan:  Coumadin 7.5mg  qWed,Sat and 5 mg other days Daily PT/INR  Jocilynn Grade, Pharm.D. Clinical Pharmacist  Phone [phone] Pager [phone] 01/19/2012, 10:54 AM

## 2012-01-19 NOTE — Progress Notes (Signed)
Occupational Therapy Session Note  Patient Details  Name: Kevin [name] MRN: [id number] Date of Birth: 1941-02-29  Today's Date: 01/19/2012 Time: 1400-1445 Time Calculation (min): 45 min  Short Term Goals: Week 1:  OT Short Term Goal 1 (Week 1): LB bath in sit and stand with Mod assist OT Short Term Goal 1 - Progress (Week 1): Met OT Short Term Goal 2 (Week 1): LB dressing in sit and stand with Mod assist OT Short Term Goal 2 - Progress (Week 1): Met OT Short Term Goal 3 (Week 1): Scoot transfers to and from drop arm commode with min assist OT Short Term Goal 3 - Progress (Week 1): Met OT Short Term Goal 4 (Week 1): Toileting with mod assist to include lateral leans OT Short Term Goal 4 - Progress (Week 1): Met OT Short Term Goal 5 (Week 1): BUE exercises 4/7 days  OT Short Term Goal 5 - Progress (Week 1): Met  Skilled Therapeutic Interventions/Progress Updates:  Patient with no complaints of pain. Co-Treatment with recreational therapist in a group setting with two other patients. Focused skilled intervention on psychosocial support, social interaction, and UE exercise using weighted bars & by doing w/c pushups. Patient with good social interaction and able to discuss disability with peers appropriately.   Precautions:  Precautions Precautions: Fall Restrictions Weight Bearing Restrictions: Yes  See FIM for current functional status  Therapy/Group: Co-Treatment and Group Therapy  Ayah Cozzolino 01/19/2012, 3:00 PM

## 2012-01-19 NOTE — Progress Notes (Signed)
Occupational Therapy Session Note  Patient Details  Name: Kevin [name] MRN: [id number] Date of Birth: 31-Dec-1940  Today's Date: 01/19/2012 Time: 1000-1100 Time Calculation (min): 60 min  Short Term Goals: Week 1:  OT Short Term Goal 1 (Week 1): LB bath in sit and stand with Mod assist OT Short Term Goal 2 (Week 1): LB dressing in sit and stand with Mod assist OT Short Term Goal 3 (Week 1): Scoot transfers to and from drop arm commode with min assist OT Short Term Goal 4 (Week 1): Toileting with mod assist to include lateral leans OT Short Term Goal 5 (Week 1): BUE exercises 4/7 days   Skilled Therapeutic Interventions/Progress Updates:    Pt engaged in bathing at walk-in shower level and dressing at bed level.  Pt completed squat pivot transfer using grab bars to tub bench in shower with min A.  Pt incorporated lateral leans to pull up pants while seated EOB.  Pt completing all transfers with squat pivot transfer.  Focus on activity tolerance, transfers, safety awareness, and bed mobility.  Therapy Documentation Precautions:  Precautions Precautions: Fall Restrictions Weight Bearing Restrictions: Yes   Pain: Pain Assessment Pain Assessment: No/denies pain Pain Score:   3 Pain Type: Surgical pain Pain Location: Leg Pain Orientation: Left Pain Descriptors: Sore Pain Frequency: Intermittent Pain Onset: Gradual Patients Stated Pain Goal: 2 Pain Intervention(s): Medication (See eMAR) Multiple Pain Sites: No  See FIM for current functional status  Therapy/Group: Individual Therapy  [name] 01/19/2012, 11:03 AM

## 2012-01-19 NOTE — Patient Care Conference (Signed)
Inpatient RehabilitationTeam Conference Note Date: 01/18/2012   Time: 3:10 PM    Patient Name: Kevin [name]      Medical Record Number: [id number]  Date of Birth: [birth year] Sex: Male         Room/Bed: 4032/4032-01 Payor Info: Payor: MEDICARE  Plan: MEDICARE PART A AND B  Product Type: *No Product type*     Admitting Diagnosis: AKA  Admit Date/Time:  01/10/2012  4:59 PM Admission Comments: No comment available   Primary Diagnosis:  Unilateral complete BKA Principal Problem: Unilateral complete BKA  Patient Active Problem List   Diagnosis Date Noted  . Unilateral complete BKA-left 01/11/2012  . Renal failure 01/11/2012  . Leucocytosis 01/11/2012  . DVT, femoral, acute 01/11/2012  . DVT of axillary vein, acute 01/04/2012  . Acute pulmonary embolism 01/04/2012  . 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 Severe sepsis(995.92) 01/02/2012  . Septic shock(785.52) 01/02/2012  . Hypotension 01/02/2012  . Right ventricular failure 01/02/2012  . 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 HTN (hypertension) 12/31/2011  . CAD (coronary artery disease) 12/31/2011  . CHF (congestive heart failure) 12/31/2011  . Tobacco abuse 12/31/2011  . Hyperlipidemia 12/31/2011    Expected Discharge Date: Expected Discharge Date: 01/24/12  Team Members Present: Physician: Dr. Faith Rogue Social Worker Present: Amada Jupiter, LCSW Nurse Present: Carmie End, RN PT Present: Reggy Eye, PT OT Present: Roney Mans, Loistine Chance, OT;Ardis Rowan, COTA SLP Present: Feliberto Gottron, SLP Other (Discipline and Name): Tora Duck, PPS Coordinator and Bayard Hugger, RN     Current Status/Progress Goal Weekly Team Focus  Medical   stooling resolved, pain better controlled.   stabilize medically for dc  see prior   Bowel/Bladder   continent bowel and bladder lbm 11-4 no loose stools noted   mod independent   up to bathrrom for toileting    Swallow/Nutrition/ Hydration             ADL's   supervisoin for  bathing at bed level; supervision for sliding board transfers to [facility]; min a/mod A for toileting and LB dressing, min verbal cues for sliding board placement and safety awareness  supervision for bathing; supervision for toilet transfers; min A for toileting and LB dressing   ADL retraining; drop arm BSC transfers; dynamic sitting balance; activity tolerance, standing balance, shower stall transfers, sdafety awareness   Mobility   Min assist for sliding board transfers, mod assist for stand pivot transfers and gait.  Supervision for sliding board transfers  Improved safety and independence with transfers, standing tolerance for increased independence with ADLs.    Communication             Safety/Cognition/ Behavioral Observations            Pain   oxycodone 10mg  po prn and flexeril 5mg  po  less than 3   educate on descentizing left residual limb    Skin   decrease bruising to extremities and abdomen  no new breakdown   educate how to assess skin and pressure relief areas sitting       *See Interdisciplinary Assessment and Plan and progress notes for long and short-term goals  Barriers to Discharge: see prior    Possible Resolutions to Barriers:  see prior-pt showing improvement here    Discharge Planning/Teaching Needs:  Plan home with family providing 24/7 assistance - primarily shared between son and nephew.  Ramp being built.      Team Discussion:  Making excellent gains - better than anticipated and will  reach mod i goals.  Start family ed towards end of week.  Revisions to Treatment Plan:  None   Continued Need for Acute Rehabilitation Level of Care: The patient requires daily medical management by a physician with specialized training in physical medicine and rehabilitation for the following conditions: Daily direction of a multidisciplinary physical rehabilitation program to ensure safe treatment while eliciting the highest outcome that is of practical value to the patient.:  Yes Daily medical management of patient stability for increased activity during participation in an intensive rehabilitation regime.: Yes Daily analysis of laboratory values and/or radiology reports with any subsequent need for medication adjustment of medical intervention for : Other;Neurological problems;Post surgical problems  Kevin [name] 01/19/2012, 9:59 AM

## 2012-01-19 NOTE — Progress Notes (Signed)
Patient ID: Kevin [name], male   DOB: 02-Jul-1940, 71 y.o.   MRN: [id number] Subjective/Complaints: Dr Arbie Cookey came by yesterday  A 12 point review of systems has been performed and if not noted above is otherwise negative.   Objective: Vital Signs: Blood pressure 120/77, pulse 63, temperature 98.2 F (36.8 C), temperature source Oral, resp. rate 17, height 5\' 8"  (1.727 m), weight 93.7 kg (206 lb 9.1 oz), SpO2 100.00%. No results found.  Basename 01/19/12 0625  WBC 6.4  HGB 10.8*  HCT 34.8*  PLT 228    Basename 01/18/12 0715  NA 135  K 4.2  CL 102  CO2 24  GLUCOSE 105*  BUN 37*  CREATININE 1.77*  CALCIUM 8.6   CBG (last 3)  No results found for this basename: GLUCAP:3 in the last 72 hours  Wt Readings from Last 3 Encounters:  01/19/12 93.7 kg (206 lb 9.1 oz)  01/10/12 96.299 kg (212 lb 4.8 oz)  01/10/12 96.299 kg (212 lb 4.8 oz)    Physical Exam:   .  Constitutional: He is oriented to person, place, and time. He appears well-developed and well-nourished.  HENT:  Head: Normocephalic and atraumatic.  Eyes: Pupils are equal, round, and reactive to light.  Neck: Normal range of motion.  Cardiovascular: Normal rate and regular rhythm.  Pulmonary/Chest: Effort normal and breath sounds normal.  Abdominal: Soft. Bowel sounds are normal.  Musculoskeletal: He exhibits edema (moderate edema L-AKA).  Neurological: He is alert and oriented to person, place, and time.  Follows basic commands without difficulty. Strength grossly 4/5 ue's. RLE is 3-4/5 lle is limited due to pain, grossly 3/5.  Skin: Skin is warm and dry.  Multiple ecchymotic areas right forearm, left flank and area on abdomen, bruises right arm also. Dry skin right foot. L-AKA without drainage. Wound well approximated.    Assessment/Plan: 1. 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 Physiatrist is  providing close team supervision and 24 hour management of active medical problems listed below. Physiatrist and rehab team continue to assess barriers to discharge/monitor patient progress toward functional and medical goals.  Pt is improving daily.  FIM: FIM - Bathing Bathing Steps Patient Completed: Chest;Right Arm;Left Arm;Abdomen;Front perineal area;Buttocks;Right upper leg;Left upper leg;Right lower leg (including foot) Bathing: 4: Steadying assist  FIM - Upper Body Dressing/Undressing Upper body dressing/undressing steps patient completed: Thread/unthread right sleeve of pullover shirt/dresss;Thread/unthread left sleeve of pullover shirt/dress;Put head through opening of pull over shirt/dress;Pull shirt over trunk Upper body dressing/undressing: 5: Supervision: Safety issues/verbal cues FIM - Lower Body Dressing/Undressing Lower body dressing/undressing steps patient completed: Thread/unthread right underwear leg;Thread/unthread left underwear leg;Pull underwear up/down;Pull pants up/down;Thread/unthread left pants leg;Thread/unthread right pants leg;Don/Doff right sock Lower body dressing/undressing: 4: Min-Patient completed 75 plus % of tasks  FIM - Toileting Toileting steps completed by patient: Adjust clothing prior to toileting;Performs perineal hygiene;Adjust clothing after toileting Toileting: 4: Steadying assist  FIM - Diplomatic Services operational officer Devices: Bedside commode Toilet Transfers: 4-To toilet/BSC: Min A (steadying Pt. > 75%);4-From toilet/BSC: Min A (steadying Pt. > 75%)  FIM - Bed/Chair Transfer Bed/Chair Transfer Assistive Devices: Therapist, occupational: 5: Supine > Sit: Supervision (verbal cues/safety issues);5: Sit > Supine: Supervision (verbal cues/safety issues);3: Bed > Chair or W/C: Mod A (lift or lower assist);3: Chair or W/C > Bed: Mod A (lift or lower assist)  FIM - Locomotion: Wheelchair Locomotion: Wheelchair: 5: Travels 150 ft or  more: maneuvers on rugs and over door sills with supervision, cueing or coaxing FIM - Locomotion: Ambulation Locomotion: Ambulation Assistive Devices: Walker - Rolling Ambulation/Gait Assistance: 3: Mod assist Locomotion: Ambulation: 0: Activity did not occur  Comprehension Comprehension Mode: Auditory Comprehension: 5-Understands complex 90% of the time/Cues < 10% of the time  Expression Expression Mode: Verbal Expression: 5-Expresses basic needs/ideas: With extra time/assistive device  Social Interaction Social Interaction: 7-Interacts appropriately with others - No medications needed.  Problem Solving Problem Solving: 5-Solves complex 90% of the time/cues < 10% of the time  Memory Memory: 5-Recognizes or recalls 90% of the time/requires cueing < 10% of the time  Medical Problem List and Plan:  1. PE/BLE DVT Prophylaxis/Anticoagulation: Pharmaceutical: Coumadin  2. Pain Management: prn medications effective. Will change baclofen to flexeril as at home.  3. Mood: no signs of distress noted.   4. Neuropsych: This patient is capable of making decisions on his/her own behalf.  5. NSVT: Coreg dose increased  . Monitor for symptoms of recurrence.  6. Acute renal failure:  Slight worsening yesterday .  Avoid nephrotoxic drugs. Off ACE/NSAIDS.  -hyponatremic yesterday  -repeat today given recent loose stool   7. BPH: continue avodart. Denies voiding problems  8. Systolic Heart Failure: continue to monitor I's/O's, weights.   Needs weights checked more regularly 9. Loose stool-   -resolved, c diff negative  -added probiotic  -pt has modified "fruit intake"    LOS (Days) 9 A FACE TO FACE EVALUATION WAS PERFORMED  KIRSTEINS,ANDREW E 01/19/2012, 10:06 AM

## 2012-01-19 NOTE — Progress Notes (Signed)
Physical Therapy Note  Patient Details  Name: Kevin [name] MRN: [id number] Date of Birth: 15-Dec-1940 Today's Date: 01/19/2012  1100-1155 (55 minutes) individual  Pain: 8/10 LT AKA/premedicated Focus of treatment: Bilateral LE AROM/strengthening, sit to stand , standing tolerance with RW Treatment: wc mobility - modified independent on unit 150 feet; transfers- wc >< mat scoot SBA + vcs to move legrest prior to transfer; LT AKA exercises - sidelying hip flexion/extension/abduction; RT LE heel slides, hip abduction, SAQs, ankle pumps (all X 20 reps) ; sit to stand from mat to RW mod assist X 2 . Pt able to stand approximately 1 minute.; wc pushups X 10.   1515-1555 (40 minutes) individual Pain: 3/10 LT AKA/ premedicated Focus of treatment: Transfer training (unlevel, squat/pivot); therapeutic exercise for activity tolerance Treatment: transfers squat/ pivot X 3 to unlevel wc >< mat SBA; UE ergonometer Level 2 x 10 minutes.   Maclane Holloran,JIM 01/19/2012, 7:55 AM

## 2012-01-19 NOTE — Evaluation (Signed)
Recreational Therapy Assessment and Plan  Patient Details  Name: Kevin [name] MRN: [id number] Date of Birth: 11-07-1940 Today's Date: 01/19/2012  Rehab Potential: Good ELOS:   2 weeks  Assessment Clinical Impression: Problem List:  Patient Active Problem List   Diagnosis   .  Ischemic leg   .  HTN (hypertension)   .  CAD (coronary artery disease)   .  CHF (congestive heart failure)   .  Tobacco abuse   .  Hyperlipidemia   .  Severe sepsis(995.92)   .  Septic shock(785.52)   .  Hypotension   .  Right ventricular failure   .  Renal mass, right   .  DVT of axillary vein, acute   .  Acute pulmonary embolism   .  Unilateral complete BKA-left   .  Renal failure   .  Leucocytosis   .  DVT, femoral, acute    Past Medical History:  Past Medical History   Diagnosis  Date   .  Arthritis    .  Hypertension    .  CHF (congestive heart failure)      a. NICM/acute CHF 2007 with EF 10-20%. b. Improved EF 50-55% by echo 12/2011.   Marland Kitchen  Hyperlipidemia    .  Coronary artery disease      a. NSTEMI 2007 with cath showing mild nonobstructive CAD by cath 2007 (40% mLAD, 40%mRCA) at time of dx of NICM.   Marland Kitchen  Tortuous aorta      a. Noted by cath 2007, recommendation for OP [place].   Marland Kitchen  BPH (benign prostatic hyperplasia)    .  Wide-complex tachycardia      a. Noted 2007.   .  Obesity    .  Valvular heart disease      a. Mod MR by echo 06/2005 but no significant MR 2013.    Past Surgical History:  Past Surgical History   Procedure  Date   .  No past surgeries    .  Vena cava filter placement  01/03/2012     Procedure: INSERTION VENA-CAVA FILTER; Surgeon: Larina Earthly, MD; Location: [facility] OR; Service: Vascular; Laterality: Left;   .  Amputation  01/05/2012     Procedure: AMPUTATION ABOVE KNEE; Surgeon: Larina Earthly, MD; Location: [facility] OR; Service: Vascular; Laterality: Left;    Assessment & Plan  Clinical Impression: Kevin [name] is a 71 y.o. male with history of CAD, LLE ulcers, admitted  on 01/01/12 with pain, cellulitis and ischemia. Started on IV antibiotics and amputation recommended by Dr. Darrick Penna. Patient with leucocytosis and renal failure. Patient developed hypotension due to septic shock on 10/20. Dr Eliott Nine consulted and felt AKI due to multifactorial due to hypotension, sepsis, as well as ace/Nsaids and eecommended continuing fluid resuscitation. 2D echo revealed severely dilated RV with EF 50-55%. Cardiology recommended and expressed concerns of PE and V/Q scan recommended. Patient with elevated cardiac enzymes secondary to NSTEMI due to sepsis. V/Q scan done revealing defects in RLL and L lobe. LE doppler positive for DVT throughout RLE. IVC filter placed 10/21 and patient underwent L-AKA 10/23 by Dr. Arbie Cookey. Renal failure resolved. Started on coumadin for treatment of PE/DVT. Patient was noted to have episode of NSVT today and cardiology felt this was multifactorial. Recommendations for patient to have nuclear stress study on outpatient basis and coreg dose increased. Therapies initiated and working on standing and posture. Therapy team recommending CIR for progression. Patient transferred to CIR on 01/10/2012 .  Pt referred to group for social support by peers, relaxation training, pain management techniques, and therapeutic activities.    Plan Min 1 time per week for >20 minutes  Recommendations for other services: None  Discharge Criteria: Patient will be discharged from TR if patient refuses treatment 3 consecutive times without medical reason.  If treatment goals not met, if there is a change in medical status, if patient makes no progress towards goals or if patient is discharged from hospital.  The above assessment, treatment plan, treatment alternatives and goals were discussed and mutually agreed upon: by patient  Kevin [name] 01/19/2012, 9:21 AM

## 2012-01-20 ENCOUNTER — Inpatient Hospital Stay (HOSPITAL_COMMUNITY): Payer: Medicare Other | Admitting: Physical Therapy

## 2012-01-20 ENCOUNTER — Inpatient Hospital Stay (HOSPITAL_COMMUNITY): Payer: Medicare Other

## 2012-01-20 DIAGNOSIS — I2699 Other pulmonary embolism without acute cor pulmonale: Secondary | ICD-10-CM

## 2012-01-20 DIAGNOSIS — N179 Acute kidney failure, unspecified: Secondary | ICD-10-CM

## 2012-01-20 DIAGNOSIS — I70269 Atherosclerosis of native arteries of extremities with gangrene, unspecified extremity: Secondary | ICD-10-CM

## 2012-01-20 DIAGNOSIS — I509 Heart failure, unspecified: Secondary | ICD-10-CM

## 2012-01-20 DIAGNOSIS — S78119A Complete traumatic amputation at level between unspecified hip and knee, initial encounter: Secondary | ICD-10-CM

## 2012-01-20 NOTE — Progress Notes (Signed)
Physical Therapy Session Note  Patient Details  Name: Kevin [name] MRN: [id number] Date of Birth: 1940-06-04  Today's Date: 01/20/2012 Time: 5366-4403 Time Calculation (min): 30 min  Short Term Goals:    Skilled Therapeutic Interventions/Progress Updates: Sit to stand transfers WC/RW X6 with contact assist; standing tolerance with RW 1 min x3 bouts with seated rest break between bouts; WC mobility - modified independent on unit 150' x2 for improved endurance; WC pushups 10x2; shoulder retractions with resistance band 10x2 for upper extremity strengthening.        Therapy Documentation Precautions:  Precautions Precautions: Fall Restrictions Weight Bearing Restrictions: Yes General:   Vital Signs:   Pain: Patient denies pain  See FIM for current functional status  Therapy/Group: Individual Therapy  Rexene Agent 01/20/2012, 11:43 AM

## 2012-01-20 NOTE — Progress Notes (Signed)
Physical Therapy Session Note  Patient Details  Name: Kevin [name] MRN: [id number] Date of Birth: 05-12-1940  Today's Date: 01/20/2012 Time: 4098-1191 Time Calculation (min): 40 min  Short Term Goals: Week 2= long term goals  Skilled Therapeutic Interventions/Progress Updates:    Car transfer x 1 rep with scoot transfer method. Cues needed for wheelchair set up and sequencing for safety. Pt advised not to pull on car frame as he attempted to initially placing him in an unsafe position. Gait x 10' with RW, overall min assist however pt very weak functionally, unstable, unable to verbalize when gets fatigued and needs chair to follow closely. Paragym strengthening lat pull downs (#20),  bicep curls (7.5#) 3 x 12 reps each. Cues for keeping Rt. LE planted on floor.    Therapy Documentation Precautions:  Precautions Precautions: Fall Restrictions Weight Bearing Restrictions: Yes Pain: Pain Assessment Pain Assessment: 0-10 Pain Score:   5 Pain Type: Surgical pain Pain Location: Leg Pain Orientation: Left Pain Descriptors: Aching Pain Frequency: ongoing Repositioned   See FIM for current functional status  Therapy/Group: Individual Therapy  Wilhemina Bonito 01/20/2012, 6:02 PM

## 2012-01-20 NOTE — Progress Notes (Signed)
Occupational Therapy Session Note  Patient Details  Name: Kevin [name] MRN: [id number] Date of Birth: [birth year]  Today's Date: 01/20/2012 Time: 1010-1100 Time Calculation (min):  Short Term Goals: Week 2:   STG=LTG  Skilled Therapeutic Interventions/Progress Updates:    Pt seated in w/c upon arrival.  Pt requested bathing at w/c level at sink.  Pt declined LB bathing and dressing because he didn't have any clean pants to wear and opted to keep current pants on.  Pt transitioned to gym for BUE therex including w/c pushups and shoulder and upper back exercises on weight machine.  Focus on activity tolerance, safety awareness, BUE therex, and w/c mobility.  Therapy Documentation Precautions:  Precautions Precautions: Fall Restrictions Weight Bearing Restrictions: Yes General:   Vital Signs:   Pain: Pain Assessment Pain Assessment: 0-10 Pain Score:   8 Pain Type: Surgical pain Pain Location: Leg Pain Orientation: Left Pain Descriptors: Aching Pain Frequency: Constant Pain Onset: Progressive Patients Stated Pain Goal: 2 Pain Intervention(s): Medication (See eMAR) Multiple Pain Sites: No ADL: ADL Grooming: Independent Where Assessed-Grooming: Sitting at sink Upper Body Bathing: Supervision/safety Where Assessed-Upper Body Bathing: Shower Lower Body Bathing: Minimal assistance Where Assessed-Lower Body Bathing: Shower Upper Body Dressing: Supervision/safety Where Assessed-Upper Body Dressing: Edge of bed Lower Body Dressing: Minimal assistance Where Assessed-Lower Body Dressing: Wheelchair Toileting: Moderate assistance Where Assessed-Toileting: Bedside Commode Toilet Transfer Equipment: Drop arm bedside commode Tub/Shower Transfer: Minimal assistance Tub/Shower Transfer Method: Squat pivot Exercises:   Other Treatments:    See FIM for current functional status  Therapy/Group: Individual Therapy  [name] 01/20/2012, 3:32 PM

## 2012-01-20 NOTE — Progress Notes (Signed)
Physical Therapy Session Note  Patient Details  Name: Kevin [name] MRN: [id number] Date of Birth: 11/14/1940  Today's Date: 01/20/2012 Time: 0730-0826 Time Calculation (min): 56 min  Short Term Goals: Week 2:   = long term goals  Skilled Therapeutic Interventions/Progress Updates:    Practiced all three methods of transfers wheelchair <> mat (scoot, sliding board, and stand pivot). Supervision for scoot and sliding board however pt continues to require assist to place board, min assist for stand pivot. Cues still needed for removing leg rest prior to transfer.Discussed safety and need for supervision/assist when at home with all transfers. Partial sit ups with wedge + weighted ball toss for core strengthening x 3 bouts until fatigue. Standing balance/tolerance activity working on one upper extremity support, min assist for balance and facilitation for improved hip extension.   Discussed ramp again - pt reports ramp is underway.   Therapy Documentation Precautions:  Precautions Precautions: Fall Restrictions Weight Bearing Restrictions: Yes Pain: Pain Assessment Pain Assessment: No/denies pain Pain Score:   6 Pain Type: Surgical pain Pain Location: Leg Pain Orientation: Left Pain Descriptors: Sore Pain Frequency: Constant Pain Onset: Gradual Patients Stated Pain Goal: 2 Pain Intervention(s):RN made aware   See FIM for current functional status  Therapy/Group: Individual Therapy  Wilhemina Bonito 01/20/2012, 12:13 PM

## 2012-01-20 NOTE — Progress Notes (Addendum)
ANTICOAGULATION CONSULT NOTE - Follow Up Consult  Pharmacy Consult: Coumadin Indication:  B/L PE + right DVT  No Known Allergies  Patient Measurements: Height: 5\' 8"  (172.7 cm) Weight: 206 lb 9.1 oz (93.7 kg) IBW/kg (Calculated) : 68.4   Vital Signs: Temp: 98.6 F (37 C) (11/07 0524) Temp src: Oral (11/07 0524) BP: 105/75 mmHg (11/07 0524) Pulse Rate: 53  (11/07 0524)  Labs:  Kevin [name] 01/19/12 0625 01/18/12 0715  HGB 10.8* --  HCT 34.8* --  PLT 228 --  APTT -- --  LABPROT 23.3* 23.7*  INR 2.18* 2.23*  HEPARINUNFRC -- --  CREATININE -- 1.77*  CKTOTAL -- --  CKMB -- --  TROPONINI -- --    Estimated Creatinine Clearance: 41.9 ml/min (by C-G formula based on Cr of 1.77).   Assessment: 43 YOM with B/L PE and right DVT to continue on Coumadin.  His INR remains therapeutic but is trending down.  His regimen was adjusted 11/6.  Goal of Therapy:  INR 2-3   Plan:  Coumadin 7.5mg  q Wed and Sat; 5 mg other days Daily PT/INR for now  Estella Husk, Pharm.D., BCPS Clinical Pharmacist  Phone [phone] Pager [phone] 01/20/2012, 10:06 AM   Addendum:  INR for today is 2.08.  Plan has not changed from above.  Estella Husk, Pharm.D., BCPS Clinical Pharmacist  Phone [phone] Pager [phone] 01/20/2012, 3:24 PM

## 2012-01-20 NOTE — Progress Notes (Signed)
Patient ID: Kevin [name], male   DOB: 1940-05-20, 71 y.o.   MRN: [id number] Subjective/Complaints: Pain control is good, tolerating therapy well  A 12 point review of systems has been performed and if not noted above is otherwise negative.   Objective: Vital Signs: Blood pressure 105/75, pulse 53, temperature 98.6 F (37 C), temperature source Oral, resp. rate 20, height 5\' 8"  (1.727 m), weight 93.7 kg (206 lb 9.1 oz), SpO2 97.00%. No results found.  Basename 01/19/12 0625  WBC 6.4  HGB 10.8*  HCT 34.8*  PLT 228    Basename 01/18/12 0715  NA 135  K 4.2  CL 102  CO2 24  GLUCOSE 105*  BUN 37*  CREATININE 1.77*  CALCIUM 8.6   CBG (last 3)  No results found for this basename: GLUCAP:3 in the last 72 hours  Wt Readings from Last 3 Encounters:  01/19/12 93.7 kg (206 lb 9.1 oz)  01/10/12 96.299 kg (212 lb 4.8 oz)  01/10/12 96.299 kg (212 lb 4.8 oz)    Physical Exam:   .  Constitutional: He is oriented to person, place, and time. He appears well-developed and well-nourished.  HENT:  Head: Normocephalic and atraumatic.  Eyes: Pupils are equal, round, and reactive to light.  Neck: Normal range of motion.  Cardiovascular: Normal rate and regular rhythm.  Pulmonary/Chest: Effort normal and breath sounds normal.  Abdominal: Soft. Bowel sounds are normal.  Musculoskeletal: He exhibits edema (moderate edema L-AKA).  Neurological: He is alert and oriented to person, place, and time.  Follows basic commands without difficulty. Strength grossly 4/5 ue's. RLE is 3-4/5 lle is limited due to pain, grossly 3/5.  Skin: Skin is warm and dry.  Multiple ecchymotic areas right forearm, left flank and area on abdomen, bruises right arm also. Dry skin right foot. L-AKA without drainage. Wound well approximated.    Assessment/Plan: 1. Functional deficits secondary to left AKA which require 3+ hours per day of interdisciplinary therapy in a comprehensive inpatient rehab  setting. Physiatrist is providing close team supervision and 24 hour management of active medical problems listed below. Physiatrist and rehab team continue to assess barriers to discharge/monitor patient progress toward functional and medical goals.  Pt is improving daily.  FIM: FIM - Bathing Bathing Steps Patient Completed: Chest;Right Arm;Left Arm;Abdomen;Front perineal area;Buttocks;Right upper leg;Left upper leg;Right lower leg (including foot) Bathing: 4: Steadying assist  FIM - Upper Body Dressing/Undressing Upper body dressing/undressing steps patient completed: Thread/unthread right sleeve of pullover shirt/dresss;Thread/unthread left sleeve of pullover shirt/dress;Put head through opening of pull over shirt/dress;Pull shirt over trunk Upper body dressing/undressing: 5: Supervision: Safety issues/verbal cues FIM - Lower Body Dressing/Undressing Lower body dressing/undressing steps patient completed: Thread/unthread right underwear leg;Thread/unthread left underwear leg;Pull underwear up/down;Pull pants up/down;Thread/unthread left pants leg;Thread/unthread right pants leg Lower body dressing/undressing: 4: Min-Patient completed 75 plus % of tasks  FIM - Toileting Toileting steps completed by patient: Performs perineal hygiene;Adjust clothing prior to toileting Toileting: 3: Mod-Patient completed 2 of 3 steps  FIM - Diplomatic Services operational officer Devices: Bedside commode Toilet Transfers: 4-To toilet/BSC: Min A (steadying Pt. > 75%);4-From toilet/BSC: Min A (steadying Pt. > 75%)  FIM - Bed/Chair Transfer Bed/Chair Transfer Assistive Devices: Sliding board;Walker Bed/Chair Transfer: 6: Supine > Sit: No assist;6: Sit > Supine: No assist;4: Bed > Chair or W/C: Min A (steadying Pt. > 75%);4: Chair or W/C > Bed: Min A (steadying Pt. > 75%)  FIM - Locomotion: Wheelchair Locomotion: Wheelchair: 6: Travels 150 ft or more, turns  around, maneuvers to table, bed or toilet,  negotiates 3% grade: maneuvers on rugs and over door sills independently FIM - Locomotion: Ambulation Locomotion: Ambulation Assistive Devices: Walker - Rolling Ambulation/Gait Assistance: 3: Mod assist Locomotion: Ambulation: 0: Activity did not occur  Comprehension Comprehension Mode: Auditory Comprehension: 5-Understands complex 90% of the time/Cues < 10% of the time  Expression Expression Mode: Verbal Expression: 5-Expresses basic needs/ideas: With extra time/assistive device  Social Interaction Social Interaction: 7-Interacts appropriately with others - No medications needed.  Problem Solving Problem Solving: 5-Solves complex 90% of the time/cues < 10% of the time  Memory Memory: 5-Recognizes or recalls 90% of the time/requires cueing < 10% of the time  Medical Problem List and Plan:  1. PE/BLE DVT Prophylaxis/Anticoagulation: Pharmaceutical: Coumadin  2. Pain Management: prn medications effective. Will change baclofen to flexeril as at home.  3. Mood: no signs of distress noted.   4. Neuropsych: This patient is capable of making decisions on his/her own behalf.  5. NSVT: Coreg dose increased  . Monitor for symptoms of recurrence.  6. Acute renal failure:  Slight worsening yesterday .  Avoid nephrotoxic drugs. Off ACE/NSAIDS.  -hyponatremic yesterday  -repeat today given recent loose stool   7. BPH: continue avodart. Denies voiding problems  8. Systolic Heart Failure: continue to monitor I's/O's, weights.   Needs weights checked more regularly 9. Loose stool-   -resolved, c diff negative  -added probiotic  -pt has modified "fruit intake"    LOS (Days) 10 A FACE TO FACE EVALUATION WAS PERFORMED  KIRSTEINS,ANDREW E 01/20/2012, 9:12 AM

## 2012-01-21 ENCOUNTER — Inpatient Hospital Stay (HOSPITAL_COMMUNITY): Payer: Medicare Other | Admitting: Physical Therapy

## 2012-01-21 ENCOUNTER — Inpatient Hospital Stay (HOSPITAL_COMMUNITY): Payer: Medicare Other

## 2012-01-21 NOTE — Progress Notes (Signed)
Occupational Therapy Session Note  Patient Details  Name: Kevin [name] MRN: [id number] Date of Birth: 11-Dec-1940  Today's Date: 01/21/2012  Session 1 Time: 1000-1055 Time Calculation (min): 55 min  Short Term Goals: Week 2:   STG=LTG  Skilled Therapeutic Interventions/Progress Updates:    Pt's son present for education.  Pt engaged in bathing at walk-in shower level and dressing EOB.  Discussed with pt and son bathroom arrangement at home.  Pt supervision for shower transfer in hospital bathroom.  Recommended that pt take sponge baths until OT visit at home to assess and recommend correct transfer strategy.  Pt and son verbalized understanding.  Pt's son attentive throughout session and provided appropriate assistance and supervision throughout.  Recommended to pt and son that someone be present at all times for transfers.  Both agreed.  Focus on safety awareness and education  Therapy Documentation Precautions:  Precautions Precautions: Fall Restrictions Weight Bearing Restrictions: Yes   Pain: Pain Assessment Pain Assessment: 0-10 Pain Score:   4 Pain Type: Acute pain Pain Location: Leg Pain Orientation: Left Pain Descriptors: Aching Pain Onset: On-going Pain Intervention(s): Repositioned  See FIM for current functional status  Therapy/Group: Individual Therapy  Session 2 Time: 1330-1400 Pt denies pain Focus on drop arm BSC transfers and discussed placement at home.  Pt able to perform scoot transfer to [facility] but requires sliding board back to w/c.  Pt agreed this was the best strategy.   Lavone Neri [facility] 01/21/2012, 10:56 AM

## 2012-01-21 NOTE — Progress Notes (Signed)
Physical Therapy Session Note  Patient Details  Name: Kevin [name] MRN: [id number] Date of Birth: 1940/10/21  Today's Date: 01/21/2012 Time: 0830-0930 Time Calculation (min): 60 min  Short Term Goals: Week 2:   = long term goals  Skilled Therapeutic Interventions/Progress Updates:    Session focused on family education. Practiced all 3 transfers: scoot, sliding board, and stand pivot with RW. Son able to set up wheelchair and provide supervision for each after cues from therapist. Pt's son demonstrating good recall of instruction.  Car transfer via scoot pivot, son providing close supervision cues from therapist for safety and placement. Gait x 8' with RW, therapist providing min/mod assist with chair directly to follow. Advised son to wait and practice with HHPT prior to performing with pt on own. Discussed ramp, need for one prior to return home. Son reports ramp is underway.   Therapy Documentation Precautions:  Precautions Precautions: Fall Restrictions Weight Bearing Restrictions: Yes Pain: Pain Assessment Pain Score:   7 Pain Type: Acute pain Pain Location: Leg Pain Orientation: Left Pain Descriptors: Aching Pain Onset: On-going Pain Intervention(s): Repositioned   See FIM for current functional status  Therapy/Group: Individual Therapy  Wilhemina Bonito 01/21/2012, 12:49 PM

## 2012-01-21 NOTE — Progress Notes (Signed)
Patient ID: Kevin [name], male   DOB: [birth year], 71 y.o.   MRN: [id number] Subjective/Complaints: Pain control is good, tolerating therapy well Family in for training A 12 point review of systems has been performed and if not noted above is otherwise negative.   Objective: Vital Signs: Blood pressure 84/59, pulse 74, temperature 98.5 F (36.9 C), temperature source Oral, resp. rate 18, height 5\' 8"  (1.727 m), weight 93.7 kg (206 lb 9.1 oz), SpO2 97.00%. No results found.  Basename 01/19/12 0625  WBC 6.4  HGB 10.8*  HCT 34.8*  PLT 228   No results found for this basename: NA:2,K:2,CL:2,CO2:2,GLUCOSE:2,BUN:2,CREATININE:2,CALCIUM:2 in the last 72 hours CBG (last 3)  No results found for this basename: GLUCAP:3 in the last 72 hours  Wt Readings from Last 3 Encounters:  01/19/12 93.7 kg (206 lb 9.1 oz)  01/10/12 96.299 kg (212 lb 4.8 oz)  01/10/12 96.299 kg (212 lb 4.8 oz)    Physical Exam:   .  Constitutional: He is oriented to person, place, and time. He appears well-developed and well-nourished.  HENT:  Head: Normocephalic and atraumatic.  Eyes: Pupils are equal, round, and reactive to light.  Neck: Normal range of motion.  Cardiovascular: Normal rate and regular rhythm.  Pulmonary/Chest: Effort normal and breath sounds normal.  Abdominal: Soft. Bowel sounds are normal.  Musculoskeletal: He exhibits edema (moderate edema L-AKA).  Neurological: He is alert and oriented to person, place, and time.  Follows basic commands without difficulty. Strength grossly 4/5 ue's. RLE is 3-4/5 lle is limited due to pain, grossly 3/5.  Skin: Skin is warm and dry.     Assessment/Plan: 1. 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 Physiatrist is providing close team supervision and 24 hour management of active medical problems listed below. Physiatrist and rehab team continue to  assess barriers to discharge/monitor patient progress toward functional and medical goals.  Pt is improving daily.  FIM: FIM - Bathing Bathing Steps Patient Completed: Chest;Right Arm;Left Arm;Abdomen Bathing: 5: Supervision: Safety issues/verbal cues (pt declined LB bathing secondary no clean pants to change )  FIM - Upper Body Dressing/Undressing Upper body dressing/undressing steps patient completed: Thread/unthread right sleeve of pullover shirt/dresss;Thread/unthread left sleeve of pullover shirt/dress;Put head through opening of pull over shirt/dress;Pull shirt over trunk Upper body dressing/undressing: 5: Set-up assist to: Obtain clothing/put away FIM - Lower Body Dressing/Undressing Lower body dressing/undressing steps patient completed: Thread/unthread right underwear leg;Thread/unthread left underwear leg;Pull underwear up/down;Pull pants up/down;Thread/unthread left pants leg;Thread/unthread right pants leg Lower body dressing/undressing: 0: Activity did not occur  FIM - Toileting Toileting steps completed by patient: Performs perineal hygiene;Adjust clothing prior to toileting Toileting: 3: Mod-Patient completed 2 of 3 steps  FIM - Diplomatic Services operational officer Devices: Bedside commode Toilet Transfers: 4-To toilet/BSC: Min A (steadying Pt. > 75%);4-From toilet/BSC: Min A (steadying Pt. > 75%)  FIM - Banker Devices: Sliding board Bed/Chair Transfer: 5: Supine > Sit: Supervision (verbal cues/safety issues);4: Sit > Supine: Min A (steadying pt. > 75%/lift 1 leg);3: Bed > Chair or W/C: Mod A (lift or lower assist);3: Chair or W/C > Bed: Mod A (lift or lower assist)  FIM - Locomotion: Wheelchair Locomotion: Wheelchair: 6: Travels 150 ft or more, turns around, maneuvers to table, bed or toilet, negotiates 3% grade: maneuvers on rugs and over door sills independently FIM - Locomotion: Ambulation Locomotion: Ambulation  Assistive Devices: Designer, industrial/product  Ambulation/Gait Assistance: 3: Mod assist Locomotion: Ambulation: 1: Travels less than 50 ft with minimal assistance (Pt.>75%)  Comprehension Comprehension Mode: Auditory Comprehension: 5-Understands complex 90% of the time/Cues < 10% of the time  Expression Expression Mode: Verbal Expression: 5-Expresses basic needs/ideas: With extra time/assistive device  Social Interaction Social Interaction: 7-Interacts appropriately with others - No medications needed.  Problem Solving Problem Solving: 5-Solves complex 90% of the time/cues < 10% of the time  Memory Memory: 5-Recognizes or recalls 90% of the time/requires cueing < 10% of the time  Medical Problem List and Plan:  1. PE/BLE DVT Prophylaxis/Anticoagulation: Pharmaceutical: Coumadin  2. Pain Management: prn medications effective. Will change baclofen to flexeril as at home.  3. Mood: no signs of distress noted.   4. Neuropsych: This patient is capable of making decisions on his/her own behalf.  5. NSVT: Coreg dose increased  . Monitor for symptoms of recurrence.  6. Acute renal failure:  Slight worsening Recheck 11/11 .  Avoid nephrotoxic drugs. Off ACE/NSAIDS.  -hyponatremic yesterday  -repeat today given recent loose stool   7. BPH: continue avodart. Denies voiding problems  8. Systolic Heart Failure: continue to monitor I's/O's, weights.   Needs weights checked more regularly 9. Loose stool-   -resolved, c diff negative  -added probiotic  -pt has modified "fruit intake"    LOS (Days) 11 A FACE TO FACE EVALUATION WAS PERFORMED  Michaelangelo Mittelman E 01/21/2012, 9:15 AM

## 2012-01-21 NOTE — Progress Notes (Signed)
Social Work Patient ID: Kevin [name], male   DOB: 1940-10-18, 71 y.o.   MRN: [id number]  Spoke with pt and son today while son in for family education.  Both very pleased with progress and feeling ready for Monday's d/c.  Pt requesting new primary care physician - will inquire about practices in his living area who might be accepting new Medicare patients.  Continue to follow.  Zell Doucette

## 2012-01-21 NOTE — Progress Notes (Signed)
ANTICOAGULATION CONSULT NOTE - Follow Up Consult  Pharmacy Consult: Coumadin Indication:  B/L PE + right DVT  No Known Allergies  Patient Measurements: Height: 5\' 8"  (172.7 cm) Weight: 206 lb 9.1 oz (93.7 kg) IBW/kg (Calculated) : 68.4   Vital Signs: Temp: 98.5 F (36.9 C) (11/08 0500) Temp src: Oral (11/08 0500) BP: 84/59 mmHg (11/08 0500) Pulse Rate: 74  (11/08 0500)  Labs:  Basename 01/21/12 0520 01/20/12 1402 01/19/12 0625  HGB -- -- 10.8*  HCT -- -- 34.8*  PLT -- -- 228  APTT -- -- --  LABPROT 23.3* 22.5* 23.3*  INR 2.18* 2.08* 2.18*  HEPARINUNFRC -- -- --  CREATININE -- -- --  CKTOTAL -- -- --  CKMB -- -- --  TROPONINI -- -- --    Estimated Creatinine Clearance: 41.9 ml/min (by C-G formula based on Cr of 1.77).   Assessment: 83 YOM with B/L PE and right DVT to continue on Coumadin.  His INR remains therapeutic and his regimen was adjusted 11/6.  Goal of Therapy:  INR 2-3   Plan:  Coumadin 7.5mg  q Wed and Sat; 5 mg other days Change to MWF PT/INR checks  Estella Husk, Pharm.D., BCPS Clinical Pharmacist  Phone [phone] Pager [phone] 01/21/2012, 9:58 AM

## 2012-01-21 NOTE — Progress Notes (Signed)
Physical Therapy Session Note  Patient Details  Name: LATIF NAZARENO MRN: [id number] Date of Birth: [birth year]  Today's Date: 01/21/2012 Time: 1115-1200 Time Calculation (min): 45 min  Short Term Goals: Week 2: = LTGs    Skilled Therapeutic Interventions/Progress Updates:  WC propulsion >200' x2 for improved endurance.  Patient continues to require occasional cuing for improved awareness for UE safety and requires min assist to manage rt legrest.  Paragym strengthening:  Biceps curls (10#), lat pull downs (20#), shoulder retraction (7.5#) 3 x 12 reps each.  Patient requires verbal cues to keep rt LE on floor during exercise and requires occasional rest break between sets.  Sit/stand transfers from [facility] x10 for scapular depression strengthening.  Ambulation with RW and min A 8' x2 bouts following closely with WC with extended seated rest break between bouts.  Patient extremely fatigued by ambulation.       Therapy Documentation Precautions:  Precautions Precautions: Fall Restrictions Weight Bearing Restrictions: Yes   Pain: Pain Assessment Pain Assessment: No/denies pain Pain Score:   4 Pain Type: Acute pain Pain Location: Leg Pain Orientation: Left Pain Descriptors: Aching Pain Onset: On-going Pain Intervention(s): Repositioned  Ambulation Ambulation/Gait Assistance: 3: Mod assist   See FIM for current functional status  Therapy/Group: Individual Therapy  Rexene Agent 01/21/2012, 12:38 PM

## 2012-01-22 ENCOUNTER — Inpatient Hospital Stay (HOSPITAL_COMMUNITY): Payer: Medicare Other | Admitting: *Deleted

## 2012-01-22 LAB — CBC
Hemoglobin: 11 g/dL — ABNORMAL LOW (ref 13.0–17.0)
MCH: 27 pg (ref 26.0–34.0)
MCV: 86.5 fL (ref 78.0–100.0)
RBC: 4.07 MIL/uL — ABNORMAL LOW (ref 4.22–5.81)
WBC: 5.6 10*3/uL (ref 4.0–10.5)

## 2012-01-22 NOTE — Progress Notes (Signed)
Occupationall Therapy Note  Patient Details  Name: MCNEAL MILLET MRN: [id number] Date of Birth: 1940/06/09 Today's Date: 01/22/2012  Pain:  None Individual therapy Time:  1330-1415  (45 min)  Addressed wc mobility to gym.  Performed arm exercise bike for 5 mins. Forward and 5 mins. Backwards.  Engaged in weighted ball toss for 10 minutes with 1 break.  HR= 53; oxy= 98%.  Unable to obtain Blood pressure.   Propelled wc back to room independently.    Humberto Seals 01/22/2012, 1:47 PM

## 2012-01-22 NOTE — Progress Notes (Signed)
Patient ID: Kevin [name], male   DOB: November 18, 1940, 71 y.o.   MRN: [id number] Subjective/Complaints:  Patient denies complaints. Denies any pain.  Review of systems patient denies any chest pain, shortness of breath, headache. He is tolerating therapies without difficulty.   Objective: Vital Signs: Blood pressure 102/68, pulse 59, temperature 97.9 F (36.6 C), temperature source Oral, resp. rate 16, height 5\' 8"  (1.727 m), weight 206 lb 9.1 oz (93.7 kg), SpO2 99.00%.   Physical Exam:   .  No acute distress. Chest clear to auscultation cardiac exam S1-S2 are regular. Abdominal exam active bowel sounds, soft.   Assessment/Plan:  BKA left-- inpatient rehab Medical Problem List and Plan:  1. PE/BLE DVT Prophylaxis/Anticoagulation: Pharmaceutical: Coumadin  2. Pain Management: prn medications effective. Will change baclofen to flexeril as at home.  3. Mood: no signs of distress noted.   4. Neuropsych: This patient is capable of making decisions on his/her own behalf.  5. NSVT: Coreg dose increased  . Monitor for symptoms of recurrence.  6. Acute renal failure:  Slight worsening Recheck 11/11 .  Avoid nephrotoxic drugs. Off ACE/NSAIDS.  -hyponatremic yesterday  -repeat today given recent loose stool   7. BPH: continue avodart. Denies voiding problems  8. Systolic Heart Failure: continue to monitor I's/O's, weights.   Needs weights checked more regularly 9. Loose stool-   -resolved, c diff negative  -added probiotic  -pt has modified "fruit intake"    LOS (Days) 12 A FACE TO FACE EVALUATION WAS PERFORMED  Kevin [name] 01/22/2012, 10:10 AM

## 2012-01-23 ENCOUNTER — Inpatient Hospital Stay (HOSPITAL_COMMUNITY): Payer: Medicare Other | Admitting: Physical Therapy

## 2012-01-23 NOTE — Progress Notes (Signed)
Physical Therapy Note  Patient Details  Name: Kevin [name] MRN: [id number] Date of Birth: [birth year] Today's Date: 01/23/2012  1515-1555 (40 minutes) individual Pain: no reported pain Focus of treatment: bilateral UE/LE strengthening exercises including LT AKA exercises Treatment: wc mobility - modified independent 150 feet on unit, 50 feet home environment; transfers wc > mat SBA for squat/pivot including setup; bed mobility independent without rails; LT AKA hip flexion, extension, abduction, adduction X 20 ; sit to stand X 5 for RT quad strengthening (min assist); wc pushups X 10 for UE strengthening ; gait 10 feet RW min assist followed by wc for safety.   Hesper Venturella,JIM 01/23/2012, 7:59 AM

## 2012-01-23 NOTE — Progress Notes (Signed)
Patient ID: Kevin [name], male   DOB: [birth year], 71 y.o.   MRN: [id number] Subjective/Complaints:   patient feels well. No complaints. Appetite is good.  Review of systems: Patient denies chest pain, shortness of breath. Patient denies headache. Patient denies pain.  Objective: Vital Signs: Blood pressure 102/62, pulse 74, temperature 98.1 F (36.7 C), temperature source Oral, resp. rate 18, height 5\' 8"  (1.727 m), weight 206 lb 9.1 oz (93.7 kg), SpO2 100.00%.   Physical Exam:   .  No acute distress. Chest clear to auscultation cardiac exam S1-S2 are regular. Abdominal exam active bowel sounds, soft.   Assessment/Plan:  BKA left-- inpatient rehab Medical Problem List and Plan:  1. PE/BLE DVT Prophylaxis/Anticoagulation: Pharmaceutical: Coumadin  2. Pain Management: prn medications effective. Will change baclofen to flexeril as at home.  3. Mood: no signs of distress noted.   4. Neuropsych: This patient is capable of making decisions on his/her own behalf.  5. NSVT: Coreg dose increased  . Monitor for symptoms of recurrence.  6. Acute renal failure:  Slight worsening Recheck 11/11 .  Avoid nephrotoxic drugs. Off ACE/NSAIDS.   Basic Metabolic Panel:    Component Value Date/Time   NA 135 01/18/2012 0715   K 4.2 01/18/2012 0715   CL 102 01/18/2012 0715   CO2 24 01/18/2012 0715   BUN 37* 01/18/2012 0715   CREATININE 1.77* 01/18/2012 0715   GLUCOSE 105* 01/18/2012 0715   CALCIUM 8.6 01/18/2012 0715    7. BPH: continue avodart. Denies voiding problems  8. Systolic Heart Failure: continue to monitor I's/O's, weights.    9. Loose stool-   -resolved,    LOS (Days) 13 A FACE TO FACE EVALUATION WAS PERFORMED  SWORDS,BRUCE HENRY 01/23/2012, 8:59 AM

## 2012-01-24 ENCOUNTER — Encounter (HOSPITAL_COMMUNITY): Payer: Medicare Other

## 2012-01-24 ENCOUNTER — Other Ambulatory Visit: Payer: Self-pay

## 2012-01-24 ENCOUNTER — Inpatient Hospital Stay (HOSPITAL_COMMUNITY): Payer: Medicare Other | Admitting: Physical Therapy

## 2012-01-24 LAB — BASIC METABOLIC PANEL
CO2: 24 mEq/L (ref 19–32)
Calcium: 8.4 mg/dL (ref 8.4–10.5)
Chloride: 105 mEq/L (ref 96–112)
Creatinine, Ser: 1.62 mg/dL — ABNORMAL HIGH (ref 0.50–1.35)
Glucose, Bld: 101 mg/dL — ABNORMAL HIGH (ref 70–99)
Sodium: 137 mEq/L (ref 135–145)

## 2012-01-24 LAB — PROTIME-INR: INR: 2.29 — ABNORMAL HIGH (ref 0.00–1.49)

## 2012-01-24 MED ORDER — WARFARIN SODIUM 5 MG PO TABS: ORAL_TABLET | ORAL | Status: DC

## 2012-01-24 MED ORDER — CARVEDILOL 6.25 MG PO TABS: 6.2500 mg | ORAL_TABLET | Freq: Two times a day (BID) | ORAL | Status: DC

## 2012-01-24 MED ORDER — ASPIRIN 81 MG PO CHEW: 81.0000 mg | CHEWABLE_TABLET | Freq: Every day | ORAL | Status: DC

## 2012-01-24 MED ORDER — OXYCODONE-ACETAMINOPHEN 10-325 MG PO TABS: 0.5000 | ORAL_TABLET | Freq: Three times a day (TID) | ORAL | Status: DC | PRN

## 2012-01-24 MED ORDER — HYDROCERIN EX CREA: 1.0000 "application " | TOPICAL_CREAM | Freq: Two times a day (BID) | CUTANEOUS | Status: DC

## 2012-01-24 NOTE — Progress Notes (Signed)
Recreational Therapy Discharge Summary Patient Details  Name: Kevin [name] MRN: [id number] Date of Birth: 06/08/1940 Today's Date: 01/24/2012  Long term goals set: 1  Long term goals met: 1  Comments on progress toward goals: Pt has made great progress during LOS and is ready for discharge home at Mod I w/c level.  TR session focused on peer support, UE strengthening, and functional mobility.  Reasons for discharge: discharge from hospital  Patient/family agrees with progress made and goals achieved: Yes  [name] 01/24/2012, 9:46 AM

## 2012-01-24 NOTE — Progress Notes (Signed)
Occupational Therapy Discharge Summary  Patient Details  Name: Kevin [name] MRN: [id number] Date of Birth: [birth year]  Today's Date: 01/24/2012  Patient has met 8 of 8 long term goals due to improved activity tolerance, improved balance, postural control and ability to compensate for deficits.  Pt made steady progress with bathing, dressing, toilet transfers and toileting, and shower transfers since admission.  Pt is supervision for toilet transfers to drop arm BSC and requires min A for toileting (clothing management).  Pt completes bathing with supervision at shower level with lateral leans for bathing buttocks.  Pt's son has been present for family education and demonstrates appropriate level of assistance/supervision.  Patient's care partner is independent to provide the necessary physical and cognitive assistance at discharge.     Recommendation:  Patient will benefit from ongoing skilled OT services in home health setting to continue to advance functional skills in the area of BADL and Reduce care partner burden.  Equipment: Drop Arm BSC and tub bench  Reasons for discharge: treatment goals met and discharge from hospital  Patient/family agrees with progress made and goals achieved: Yes  OT Discharge   ADL ADL Eating: Independent Where Assessed-Eating: Wheelchair Grooming: Independent Where Assessed-Grooming: Wheelchair;Sitting at sink Upper Body Bathing: Supervision/safety Where Assessed-Upper Body Bathing: Shower Lower Body Bathing: Supervision/safety Where Assessed-Lower Body Bathing: Shower Upper Body Dressing: Modified independent (Device) Where Assessed-Upper Body Dressing: Edge of bed Lower Body Dressing: Supervision/safety Where Assessed-Lower Body Dressing: Edge of bed Toileting: Minimal assistance Where Assessed-Toileting: Bedside Commode Toilet Transfer: Close supervision Toilet Transfer Method: Geophysical data processor: Drop  arm bedside commode Tub/Shower Transfer: Minimal assistance Tub/Shower Transfer Method: Engineer, water: Minimal assistance Film/video editor Method: Environmental consultant: Emergency planning/management officer Vision/Perception  Vision - History Baseline Vision: No visual deficits Patient Visual Report: No change from baseline Vision - Assessment Eye Alignment: Within Functional Limits Perception Perception: Within Functional Limits Praxis Praxis: Intact  Cognition Overall Cognitive Status: Appears within functional limits for tasks assessed Arousal/Alertness: Awake/alert Orientation Level: Oriented X4 Sensation Sensation Light Touch: Appears Intact Stereognosis: Appears Intact Hot/Cold: Appears Intact Proprioception: Appears Intact Coordination Gross Motor Movements are Fluid and Coordinated: Yes Fine Motor Movements are Fluid and Coordinated: Yes Motor  Motor Motor: Within Functional Limits    Trunk/Postural Assessment  Cervical Assessment Cervical Assessment: Within Functional Limits Thoracic Assessment Thoracic Assessment: Within Functional Limits Lumbar Assessment Lumbar Assessment: Within Functional Limits Postural Control Postural Control: Within Functional Limits    Extremity/Trunk Assessment RUE Assessment RUE Assessment: Within Functional Limits LUE Assessment LUE Assessment: Within Functional Limits  See FIM for current functional status  [name] 01/24/2012, 2:53 PM

## 2012-01-24 NOTE — Progress Notes (Signed)
Pt discharged home with son, Marissa Nestle PA provided discharge instructions, pt and son verbalized an understanding and denies any questions or concerns

## 2012-01-24 NOTE — Progress Notes (Signed)
Occupational Therapy Session Note  Patient Details  Name: Kevin [name] MRN: [id number] Date of Birth: 10-31-1940  Today's Date: 01/24/2012 Time: 0700-0754 Time Calculation (min): 54 min  Short Term Goals: Week 2:   STG=LTG  Skilled Therapeutic Interventions/Progress Updates:    Pt engaged in bathing and dressing tasks with focus on safety awareness, bed mobility, transfers, and clothing management.  Pt continues to require min verbal cues for setup prior to transfers.  Pt completes transfers with supervision.  Recommended that patient complete bathing and dressing tasks at bed level/EOB until home OT/PT visit and make recommendations.  Therapy Documentation Precautions:  Precautions Precautions: Fall Restrictions Weight Bearing Restrictions: Yes LLE Weight Bearing: Non weight bearing Pain: Pain Assessment Pain Assessment: No/denies pain  See FIM for current functional status  Therapy/Group: Individual Therapy  [name] 01/24/2012, 8:01 AM

## 2012-01-24 NOTE — Progress Notes (Signed)
Social Work  Discharge Note  The overall goal for the admission was met for:   Discharge location: Yes - home with family to provide any needed assistance  Length of Stay: Yes - 14 days  Discharge activity level: Yes - modified independent to occasional min-mod assist  Home/community participation: Yes  Services provided included: MD, RD, PT, OT, RN, TR, Pharmacy and SW  Financial Services: Medicare and Medicaid  Follow-up services arranged: Home Health: RN, PT, OT via [facility], DME: drop arm commode, tub bench and 30" transfer board via [facility], Other: provided information on local Amputee Support Group and Patient/Family has no preference for HH/DME agencies  Comments (or additional information):  Patient/Family verbalized understanding of follow-up arrangements: Yes  Individual responsible for coordination of the follow-up plan: patient  Confirmed correct DME delivered: Manjinder Breau 01/24/2012    Aamani Moose

## 2012-01-24 NOTE — Progress Notes (Signed)
ANTICOAGULATION CONSULT NOTE - Follow Up Consult  Pharmacy Consult: Coumadin Indication:  B/L PE + right DVT  No Known Allergies  Patient Measurements: Height: 5\' 8"  (172.7 cm) Weight: 206 lb 9.1 oz (93.7 kg) IBW/kg (Calculated) : 68.4   Vital Signs: Temp: 98.3 F (36.8 C) (11/11 0616) Temp src: Oral (11/11 0616) BP: 130/70 mmHg (11/11 0828) Pulse Rate: 60  (11/11 0828)  Labs:  Basename 01/24/12 0645 01/22/12 0654  HGB -- 11.0*  HCT -- 35.2*  PLT -- 209  APTT -- --  LABPROT 24.2* --  INR 2.29* --  HEPARINUNFRC -- --  CREATININE 1.62* --  CKTOTAL -- --  CKMB -- --  TROPONINI -- --    Estimated Creatinine Clearance: 45.8 ml/min (by C-G formula based on Cr of 1.62).   Assessment: 71 y/o male patient with B/L PE and right DVT to continue on Coumadin.  His INR remains therapeutic and his regimen was adjusted 11/6.  Goal of Therapy:  INR 2-3   Plan:  Coumadin 7.5mg  q Wed and Sat; 5 mg other days MWF PT/INR checks  Verlene Mayer, PharmD, BCPS Pager [phone] 01/24/2012, 10:24 AM

## 2012-01-24 NOTE — Discharge Instructions (Signed)
Inpatient Rehab Discharge Instructions  KHAMARI SHEEHAN Discharge date and time:  01/24/12  Activities/Precautions/ Functional Status: Activity: activity as tolerated with supervision. Diet: low fat, low cholesterol diet. Low salt.  Wound Care: wash with soap and water. Keep clean and dry.  wear stocking on it.  Functional status:  ___ No restrictions     ___ Walk up steps independently _X__ 24/7 supervision/assistance   ___ Walk up steps with assistance ___ Intermittent supervision/assistance  ___ Bathe/dress independently ___ Walk with walker     _X__ Bathe/dress with assistance ___ Walk Independently    ___ Shower independently ___ Walk with assistance    ___ Shower with assistance _X__ No alcohol     ___ Return to work/school ________    COMMUNITY REFERRALS UPON DISCHARGE:    Home Health:   PT     OT    RN                   Agency: [facility] Phone: [phone]    Medical Equipment/Items Ordered: drop arm commode, tub bench and transfer board                                                      Agency/Supplier: [facility] @ [phone]   GENERAL COMMUNITY RESOURCES FOR PATIENT/FAMILY:  Support Groups: Amputee Group      Special Instructions:    My questions have been answered and I understand these instructions. I will adhere to these goals and the provided educational materials after my discharge from the hospital.  Patient/Caregiver Signature _______________________________ Date __________  Clinician Signature _______________________________________ Date __________  Please bring this form and your medication list with you to all your follow-up doctor's appointments.

## 2012-01-24 NOTE — Progress Notes (Signed)
Physical Therapy Discharge Summary  Patient Details  Name: Kevin [name] MRN: [id number] Date of Birth: 27-Feb-1941  Today's Date: 01/24/2012 Time: 8413-2440 Time Calculation (min): 55 min  Session focused on discussion of home management and safety in the home. Discussed pressure relief, skin checks of Rt. LE, and HEP to be performed daily. Pt has ramp installed. Pt performed wheelchair mobility x >150' in community environment negotiating incline/declines with supervision. Pt becomes fatigued with task but able to complete without rest indicating impaired but improved endurance. Gait x 11' with RW, min-guard assist. Pt with improved posture and overall strength.  Pt missed 5 min treatment secondary to EKG.  Patient has met 9 of 9 long term goals due to improved activity tolerance, improved balance, increased strength, decreased pain and ability to compensate for deficits.  Patient to discharge at a wheelchair level Supervision.   Patient's care partner is independent to provide the necessary physical assistance at discharge.  Reasons goals not met: NA  Recommendation:  Patient will benefit from ongoing skilled PT services in home health setting to continue to advance safe functional mobility, address ongoing impairments in functional weakness and endurance, increased need for assist/supervision with transfers/standing activities secondary to forgetting safe sequencing, and minimize fall risk. Pt will also benefit from preparation for future prosthesis.   Equipment: sliding board  Reasons for discharge: treatment goals met and discharge from hospital  Patient/family agrees with progress made and goals achieved: Yes  PT Discharge Precautions/Restrictions Restrictions Weight Bearing Restrictions: Yes LLE Weight Bearing: Non weight bearing Vital Signs Therapy Vitals Temp: 98.3 F (36.8 C) Temp src: Oral Pulse Rate: 60  Resp: 18  BP: 130/70 mmHg Patient Position, if appropriate:  Lying Oxygen Therapy SpO2: 98 % O2 Device: None (Room air) Pain Pain Assessment Pain Assessment: No/denies pain  Cognition Orientation Level: Oriented X4 Comments: Slightly slow processing, needs supervision with transfers as he sometimes is unsafe Sensation  [facility]  Mobility Bed Mobility Bed Mobility: Supine to Sit;Sit to Supine Supine to Sit: 6: Modified independent (Device/Increase time) Sitting - Scoot to Edge of Bed: 6: Modified independent (Device/Increase time) Sit to Supine: 6: Modified independent (Device/Increase time) Transfers Sit to Stand: 5: Supervision Stand to Sit: 5: Supervision Stand Pivot Transfers: 5: Supervision  Balance Static Standing Balance Static Standing - Balance Support: Bilateral upper extremity supported Static Standing - Level of Assistance: 5: Stand by assistance Static Standing - Comment/# of Minutes: Very limited standing endurance, needs support surface directly behind self  Extremity Assessment      RLE Assessment RLE Assessment: Exceptions to [facility] RLE Strength RLE Overall Strength Comments: Funtional weakness evident but improved Right Hip Flexion: 3+/5 Right Knee Flexion: 3+/5 Right Knee Extension: 3+/5 Right Ankle Dorsiflexion: 3+/5 LLE Assessment LLE Assessment: Exceptions to WFL LLE AROM (degrees) LLE Overall AROM Comments: Decreased Lt. hip extension LLE Strength LLE Overall Strength Comments: 3+/5 Lt. hip flexion, 3-/5 hip extension  See FIM for current functional status  Wilhemina Bonito 01/24/2012, 9:08 AM

## 2012-01-24 NOTE — Discharge Summary (Signed)
NAME:  AKSEL, BENCOMO NO.:  [id number]  MEDICAL RECORD NO.:  [id number]  LOCATION:  4032                         FACILITY:  [facility]  PHYSICIAN:  Ranelle Oyster, M.D.DATE OF BIRTH:  29-Oct-1940  DATE OF ADMISSION:  01/10/2012 DATE OF DISCHARGE:  01/24/2012                              DISCHARGE SUMMARY   DISCHARGE DIAGNOSES: 1. Left above-knee amputation. 2. Acute on chronic renal failure, resolved. 3. Non-sustained ventricular tachycardia. 4. Benign prostatic hypertrophy. 5. Pulmonary embolism. 6. Bilateral lower extremity deep vein thrombosis. 7. Heart failure compensated.  HISTORY OF PRESENT ILLNESS:  Mr. Brance Dartt is a 71 year old male with history of coronary artery disease, chronic left lower extremity ulcers admitted January 01, 2012, with pain, cellulitis and ischemia. He was started on IV antibiotics and amputation was recommended by Dr. Darrick Penna.  The patient was noted to have leukocytosis as well as renal failure.  He developed hyportension due to septic shock.  On January 02, 2012, Dr. Jeanice Lim was consulted and felt that acute kidney injury was multifactorial due to hypertension, sepsis, Ace as well as NSAIDs, and recommended continuing fluids, resuscitation.  2D echo done revealed severely dilated right ventricle with EF of 50-55% and Cardiology expressed concerns of PE.  V/Q scan was recommended and this was done revealing defect in right lower lobe as well as left lobe.  Lower extremity Dopplers done were positive for DVT throughout right lower extremity.  IVC filter was placed on October 21st and the patient underwent left AKA on October 23rd by Dr. Arbie Cookey.  He was started on Coumadin for treatment of PE and DVT.  He was also noted to have elevated cardiac enzymes due to NSTEMI due to sepsis.  He developed NSVT, which Cardiology felt was multifactorial.  They recommend the patient have nuclear stress study on outpatient basis and Coreg  dose was adjusted.  Therapies are ongoing and working on pre-gait issues. Therapy team recommended CIR for progression.  PAST MEDICAL HISTORY:  Positive for arthritis, hypertension, congestive heart failure, hyperlipidemia, coronary artery disease, BPH, history of wide complex tachycardia, and obesity.  FUNCTIONAL HISTORY:  The patient has had impaired mobility for the past 10 months and has been nonambulatory past 3-4 weeks due to left lower extremity ulcers and weakness.  FUNCTIONAL STATUS:  The patient was min assist for bed mobility. Required +2 total assist 30% for stand pivot transfers.  He requires setup assist for upper body bathing and dressing, +2 total assist for lower body care.  RECENT LABORATORY FINDINGS:  Check of lytes from November 10 revealed sodium 137, potassium 4.1, chloride 105, CO2 24, BUN 23, creatinine 1.62, glucose 101.  Recent CBC from January 21, 2012, reveals hemoglobin 11.0, hematocrit 35.2, white count 5.6, platelets 209.  PT/INR at time of discharge was at 24.2 and 2.29.  C. diff checked on November 3 was negative.  EKG done revealed sinus rhythm with frequent PVCs, nonspecific ST-T wave abnormality and left axis deviation.  HOSPITAL COURSE:  Mr. Ladarion Munyon was admitted to Rehab on January 10, 2012, for inpatient therapies to consist of PT, OT at least 3 hours 5 days a week.  Past admission, physiatrist, rehab, RN, and therapy  team have worked together to provide customized collaborative interdisciplinary care.  Rehab RN has been working with the patient on bowel and bladder program as well as wound care monitoring.  The patient's blood pressures have been checked on b.i.d. basis and these have been reasonably controlled ranging from low 100s to 130 systolic, 60-70s diastolic.  Heart rate has ranged from 60s to 70s range. EKG was done at time of discharge due to irregular heart rate and  this revealed frequent PVCs.    The patient's renal  status has been followed along.  His BUN and creatinine has stabilized out at 33 and 1.62.  His acute blood loss  anemia is slowly improving with most recent check revealing hemoglobin at 11.0.  The patient's left AKA site has been monitored.  Staples remained intact and to be discontinued per Dr. Candie Chroman input.  This is clean and dry with minimal edema and some scabbing at around incision line.  The patient's p.o. intake has been good.  He has been continent of bowel and bladder.  Pain management has been reasonable with p.r.n. use of oxycodone.  During the patient's stay in rehab, weekly team conferences were held to monitor the patient's progress, set goals, as well as discuss barriers to discharge.  OT has been working with the patient on activity tolerance, balance as well as ADL tasks.  The patient has made steady progress and is currently at supervision level for bathing at shower level with lateral lean for pericare.  He requires min assist for clothing management past toileting.  Physical Therapy has worked with the patient on mobility as well as strengthening.  He has been educated regarding pressure relief measures as well as skin checks and home exercise plan.  The patient is able to propel wheelchair for greater than 150 feet in community setting with supervision.  He is able to ambulate 11 feet at min guard assist with rolling walker and is showing improvement in posture and overall strength.  Family education was done with son about need for supervision and assistance as needed.  Family is supportive and will continue to provide assistance as needed past discharge.  Further followup with PT, OT, and home health nurse have [facility].  On January 24, 2012, the patient was discharged to home in improved condition.  DISCHARGE MEDICATIONS: 1. Percocet 10/325 (#45) half to one p.o. q.8 h. p.r.n. moderate-to-     severe pain. 2. Lipitor 20 mg p.o.  per day. 3. Flexeril 5 mg p.o. t.i.d. p.r.n. spasms. 4. Avodart 0.5 mg p.o. q.p.m. 5. Coreg 6.25 mg p.o. b.i.d. 6. Coumadin 5 mg on Monday, Tuesday, Thursday, Friday and Saturday and     7.5 mg on Wednesdays and Saturdays. 7. Aspirin 81 mg p.o. per day. 8. Eucerin cream to dry skin on right foot daily.  ACTIVITY LEVEL:  As tolerated at wheelchair level with 24-hour supervision.  DIET:  Low fat, low cholesterol, low salt.  WOUND CARE:  Wash area with soap and water, pat dry, and continue to wear stockinette over it.  SPECIAL INSTRUCTIONS:  [facility] to provide PT, OT, and RN.  FOLLOWUP:  The patient to follow up with Dr. Riley Kill on December 9th at 10:30 a.m.  Follow up with Dr. Tawanna Cooler Early November 19 at 2:45 for postop check.  Follow up with Dr. Charlott Rakes for setup as primary care patient and for protime check on November 13th at 11:40 a.m.  Delle Reining, P.A.   ______________________________ Ranelle Oyster, M.D.    PL/MEDQ  D:  01/24/2012  T:  01/24/2012  Job:  [id number]  cc:   Charlott Rakes, MD Nadara Mustard, MD Pricilla Riffle, MD, [facility] Larina Earthly, M.D.

## 2012-01-24 NOTE — Progress Notes (Signed)
Patient ID: Kevin [name], male   DOB: 11-27-1940, 71 y.o.   MRN: [id number] Subjective/Complaints: Pain control is good, tolerating therapy well Family education completed. No complaints of dizziness or CP  A 12 point review of systems has been performed and if not noted above is otherwise negative.   Objective: Vital Signs: Blood pressure 115/73, pulse 60, temperature 98.3 F (36.8 C), temperature source Oral, resp. rate 18, height 5\' 8"  (1.727 m), weight 93.7 kg (206 lb 9.1 oz), SpO2 98.00%. No results found.  Basename 01/22/12 0654  WBC 5.6  HGB 11.0*  HCT 35.2*  PLT 209    Basename 01/24/12 0645  NA 137  K 4.1  CL 105  CO2 24  GLUCOSE 101*  BUN 33*  CREATININE 1.62*  CALCIUM 8.4   CBG (last 3)  No results found for this basename: GLUCAP:3 in the last 72 hours  Wt Readings from Last 3 Encounters:  01/19/12 93.7 kg (206 lb 9.1 oz)  01/10/12 96.299 kg (212 lb 4.8 oz)  01/10/12 96.299 kg (212 lb 4.8 oz)    Physical Exam:   .  Constitutional: He is oriented to person, place, and time. He appears well-developed and well-nourished.  HENT:  Head: Normocephalic and atraumatic.  Eyes: Pupils are equal, round, and reactive to light.  Neck: Normal range of motion.  Cardiovascular: Irregularly irregular. HR 60 bpm Pulmonary/Chest: Effort normal and breath sounds normal.  Abdominal: Soft. Bowel sounds are normal.  Musculoskeletal: He exhibits edema (moderate edema L-AKA).  Neurological: He is alert and oriented to person, place, and time.  Follows basic commands without difficulty. Strength grossly 4/5 ue's. RLE is 3-4/5 lle is limited due to pain, grossly 3/5.  Skin: Skin is warm and dry. Staples in place L-BKA without erythema or drainage.     Assessment/Plan: 1. Functional deficits secondary to left AKA stable for D/C PM&R f/u, Cardiology f/u, PCP f/u FIM: FIM - Bathing Bathing Steps Patient Completed: Chest;Right Arm;Left Arm;Abdomen;Front perineal  area;Buttocks;Right upper leg;Left upper leg;Right lower leg (including foot) Bathing: 5: Supervision: Safety issues/verbal cues  FIM - Upper Body Dressing/Undressing Upper body dressing/undressing steps patient completed: Thread/unthread right sleeve of pullover shirt/dresss;Thread/unthread left sleeve of pullover shirt/dress;Put head through opening of pull over shirt/dress;Pull shirt over trunk Upper body dressing/undressing: 7: Complete Independence: No helper FIM - Lower Body Dressing/Undressing Lower body dressing/undressing steps patient completed: Thread/unthread right underwear leg;Thread/unthread left underwear leg;Pull underwear up/down;Thread/unthread right pants leg;Thread/unthread left pants leg;Don/Doff right sock Lower body dressing/undressing: 4: Min-Patient completed 75 plus % of tasks  FIM - Toileting Toileting steps completed by patient: Adjust clothing prior to toileting;Performs perineal hygiene;Adjust clothing after toileting Toileting Assistive Devices: Grab bar or rail for support Toileting: 4: Steadying assist  FIM - Diplomatic Services operational officer Devices: Bedside commode Toilet Transfers: 5-Set-up assist to: Apply orthosis/W/C setup;5-From toilet/BSC: Supervision (verbal cues/safety issues)  FIM - Banker Devices: Psychiatrist Transfer: 6: Assistive device: no helper;5: Bed > Chair or W/C: Supervision (verbal cues/safety issues);5: Chair or W/C > Bed: Supervision (verbal cues/safety issues)  FIM - Locomotion: Wheelchair Locomotion: Wheelchair: 5: Travels 50 - 149 ft, turns around, maneuvers to table, bed or toilet, negotiates 3% grade: modified independent FIM - Locomotion: Ambulation Locomotion: Ambulation Assistive Devices: Designer, industrial/product Ambulation/Gait Assistance: 4: Min guard (followed by wc for safety) Locomotion: Ambulation: 1: Travels less than 50 ft with minimal assistance  (Pt.>75%)  Comprehension Comprehension Mode: Auditory Comprehension: 5-Understands complex 90% of the time/Cues <  10% of the time  Expression Expression Mode: Verbal Expression: 5-Expresses complex 90% of the time/cues < 10% of the time  Social Interaction Social Interaction: 7-Interacts appropriately with others - No medications needed.  Problem Solving Problem Solving: 5-Solves complex 90% of the time/cues < 10% of the time  Memory Memory: 5-Recognizes or recalls 90% of the time/requires cueing < 10% of the time  Medical Problem List and Plan:  1. PE/BLE DVT Prophylaxis/Anticoagulation: Pharmaceutical: Coumadin  2. Pain Management: prn medications effective. Will change baclofen to flexeril as at home.  3. Mood: no signs of distress noted.   4. Neuropsych: This patient is capable of making decisions on his/her own behalf.  5. NSVT:  Without recurrence on BB. HR 45 on dynamap but 60 apically.   -EKG today 81 bpm with sinus rhythm, PVC   -Bradycardia likely due to missed beats. Continue current dose of BB.   -Follow up with [facility] cardiology for work up past discharge.  6. Acute on CKD: BUN/Cr stabilizing out. Evidence of CKD on renal U/S  - Follow up with primary MD for monitoring.  -  Avoid nephrotoxic drugs. Off ACE/NSAIDS.  -no recurrent hyponatremia.  7. BPH: continue avodart. Denies voiding problems  8. 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 No signs of overload. 9. Loose stool: +BM. No recurrence.   -resolved, c diff negative  -added probiotic      LOS (Days) 14 A FACE TO FACE EVALUATION WAS PERFORMED  Love, Evlyn Kanner 01/24/2012, 8:15 AM

## 2012-01-26 ENCOUNTER — Telehealth: Payer: Self-pay | Admitting: *Deleted

## 2012-01-26 DIAGNOSIS — I2699 Other pulmonary embolism without acute cor pulmonale: Secondary | ICD-10-CM | POA: Diagnosis not present

## 2012-01-26 DIAGNOSIS — Z7901 Long term (current) use of anticoagulants: Secondary | ICD-10-CM | POA: Diagnosis not present

## 2012-01-26 DIAGNOSIS — G8921 Chronic pain due to trauma: Secondary | ICD-10-CM | POA: Diagnosis not present

## 2012-01-26 DIAGNOSIS — I739 Peripheral vascular disease, unspecified: Secondary | ICD-10-CM | POA: Diagnosis not present

## 2012-01-26 DIAGNOSIS — N179 Acute kidney failure, unspecified: Secondary | ICD-10-CM | POA: Diagnosis not present

## 2012-01-26 DIAGNOSIS — I2510 Atherosclerotic heart disease of native coronary artery without angina pectoris: Secondary | ICD-10-CM | POA: Diagnosis not present

## 2012-01-26 DIAGNOSIS — J449 Chronic obstructive pulmonary disease, unspecified: Secondary | ICD-10-CM | POA: Diagnosis not present

## 2012-01-26 NOTE — Telephone Encounter (Signed)
We have been trying to get in touch with Kevin [name] and we do not have a phone number or any way to reach him. Can you give [place] a number? I called Dawn and gave her the only  # listed in EPIC.

## 2012-01-27 DIAGNOSIS — I2510 Atherosclerotic heart disease of native coronary artery without angina pectoris: Secondary | ICD-10-CM | POA: Diagnosis not present

## 2012-01-27 DIAGNOSIS — I10 Essential (primary) hypertension: Secondary | ICD-10-CM | POA: Diagnosis not present

## 2012-01-27 DIAGNOSIS — I2699 Other pulmonary embolism without acute cor pulmonale: Secondary | ICD-10-CM | POA: Diagnosis not present

## 2012-01-27 DIAGNOSIS — I509 Heart failure, unspecified: Secondary | ICD-10-CM | POA: Diagnosis not present

## 2012-01-27 DIAGNOSIS — I739 Peripheral vascular disease, unspecified: Secondary | ICD-10-CM | POA: Diagnosis not present

## 2012-01-27 DIAGNOSIS — Z48812 Encounter for surgical aftercare following surgery on the circulatory system: Secondary | ICD-10-CM | POA: Diagnosis not present

## 2012-01-28 DIAGNOSIS — I10 Essential (primary) hypertension: Secondary | ICD-10-CM | POA: Diagnosis not present

## 2012-01-28 DIAGNOSIS — I739 Peripheral vascular disease, unspecified: Secondary | ICD-10-CM | POA: Diagnosis not present

## 2012-01-28 DIAGNOSIS — Z48812 Encounter for surgical aftercare following surgery on the circulatory system: Secondary | ICD-10-CM | POA: Diagnosis not present

## 2012-01-28 DIAGNOSIS — I509 Heart failure, unspecified: Secondary | ICD-10-CM | POA: Diagnosis not present

## 2012-01-28 DIAGNOSIS — I2699 Other pulmonary embolism without acute cor pulmonale: Secondary | ICD-10-CM | POA: Diagnosis not present

## 2012-01-28 DIAGNOSIS — I2510 Atherosclerotic heart disease of native coronary artery without angina pectoris: Secondary | ICD-10-CM | POA: Diagnosis not present

## 2012-01-31 ENCOUNTER — Encounter: Payer: Self-pay | Admitting: Vascular Surgery

## 2012-01-31 DIAGNOSIS — I739 Peripheral vascular disease, unspecified: Secondary | ICD-10-CM | POA: Diagnosis not present

## 2012-01-31 DIAGNOSIS — I2699 Other pulmonary embolism without acute cor pulmonale: Secondary | ICD-10-CM | POA: Diagnosis not present

## 2012-01-31 DIAGNOSIS — I509 Heart failure, unspecified: Secondary | ICD-10-CM | POA: Diagnosis not present

## 2012-01-31 DIAGNOSIS — I2510 Atherosclerotic heart disease of native coronary artery without angina pectoris: Secondary | ICD-10-CM | POA: Diagnosis not present

## 2012-01-31 DIAGNOSIS — I10 Essential (primary) hypertension: Secondary | ICD-10-CM | POA: Diagnosis not present

## 2012-01-31 DIAGNOSIS — Z48812 Encounter for surgical aftercare following surgery on the circulatory system: Secondary | ICD-10-CM | POA: Diagnosis not present

## 2012-02-01 ENCOUNTER — Encounter: Payer: Self-pay | Admitting: Vascular Surgery

## 2012-02-01 ENCOUNTER — Ambulatory Visit (INDEPENDENT_AMBULATORY_CARE_PROVIDER_SITE_OTHER): Payer: Medicare Other | Admitting: Vascular Surgery

## 2012-02-01 VITALS — BP 152/92 | HR 53 | Ht 68.0 in | Wt 206.0 lb

## 2012-02-01 DIAGNOSIS — S78119A Complete traumatic amputation at level between unspecified hip and knee, initial encounter: Secondary | ICD-10-CM

## 2012-02-01 DIAGNOSIS — Z89619 Acquired absence of unspecified leg above knee: Secondary | ICD-10-CM

## 2012-02-01 NOTE — Progress Notes (Signed)
Patient presents today for followup of his left above-knee amputation on 01/05/2012. He presented with a nonsalvageable foot with a progressive gangrenous changes. He underwent above-knee amputation and was transferred to rehabilitation facility where he did quite well.  Physical exam today his incision is well-healed he has a minimal soreness and no skin breakdown  He will have a staple removal today and see [place] on an as-needed basis if he has any wound problems. He was given a prescription to followup with [facility] For potential prosthetic treatment.

## 2012-02-02 DIAGNOSIS — I739 Peripheral vascular disease, unspecified: Secondary | ICD-10-CM | POA: Diagnosis not present

## 2012-02-02 DIAGNOSIS — I509 Heart failure, unspecified: Secondary | ICD-10-CM | POA: Diagnosis not present

## 2012-02-02 DIAGNOSIS — Z48812 Encounter for surgical aftercare following surgery on the circulatory system: Secondary | ICD-10-CM | POA: Diagnosis not present

## 2012-02-02 DIAGNOSIS — I2699 Other pulmonary embolism without acute cor pulmonale: Secondary | ICD-10-CM | POA: Diagnosis not present

## 2012-02-02 DIAGNOSIS — I10 Essential (primary) hypertension: Secondary | ICD-10-CM | POA: Diagnosis not present

## 2012-02-02 DIAGNOSIS — I2510 Atherosclerotic heart disease of native coronary artery without angina pectoris: Secondary | ICD-10-CM | POA: Diagnosis not present

## 2012-02-04 DIAGNOSIS — I10 Essential (primary) hypertension: Secondary | ICD-10-CM | POA: Diagnosis not present

## 2012-02-04 DIAGNOSIS — I509 Heart failure, unspecified: Secondary | ICD-10-CM | POA: Diagnosis not present

## 2012-02-04 DIAGNOSIS — Z7901 Long term (current) use of anticoagulants: Secondary | ICD-10-CM | POA: Diagnosis not present

## 2012-02-04 DIAGNOSIS — I2510 Atherosclerotic heart disease of native coronary artery without angina pectoris: Secondary | ICD-10-CM | POA: Diagnosis not present

## 2012-02-04 DIAGNOSIS — Z48812 Encounter for surgical aftercare following surgery on the circulatory system: Secondary | ICD-10-CM | POA: Diagnosis not present

## 2012-02-04 DIAGNOSIS — I2699 Other pulmonary embolism without acute cor pulmonale: Secondary | ICD-10-CM | POA: Diagnosis not present

## 2012-02-04 DIAGNOSIS — M25569 Pain in unspecified knee: Secondary | ICD-10-CM | POA: Diagnosis not present

## 2012-02-04 DIAGNOSIS — I739 Peripheral vascular disease, unspecified: Secondary | ICD-10-CM | POA: Diagnosis not present

## 2012-02-07 ENCOUNTER — Encounter: Payer: Medicare Other | Admitting: Physician Assistant

## 2012-02-07 DIAGNOSIS — I2510 Atherosclerotic heart disease of native coronary artery without angina pectoris: Secondary | ICD-10-CM | POA: Diagnosis not present

## 2012-02-07 DIAGNOSIS — I509 Heart failure, unspecified: Secondary | ICD-10-CM | POA: Diagnosis not present

## 2012-02-07 DIAGNOSIS — I739 Peripheral vascular disease, unspecified: Secondary | ICD-10-CM | POA: Diagnosis not present

## 2012-02-07 DIAGNOSIS — I10 Essential (primary) hypertension: Secondary | ICD-10-CM | POA: Diagnosis not present

## 2012-02-07 DIAGNOSIS — Z48812 Encounter for surgical aftercare following surgery on the circulatory system: Secondary | ICD-10-CM | POA: Diagnosis not present

## 2012-02-07 DIAGNOSIS — I2699 Other pulmonary embolism without acute cor pulmonale: Secondary | ICD-10-CM | POA: Diagnosis not present

## 2012-02-08 ENCOUNTER — Telehealth: Payer: Self-pay | Admitting: *Deleted

## 2012-02-08 DIAGNOSIS — I10 Essential (primary) hypertension: Secondary | ICD-10-CM | POA: Diagnosis not present

## 2012-02-08 DIAGNOSIS — I739 Peripheral vascular disease, unspecified: Secondary | ICD-10-CM | POA: Diagnosis not present

## 2012-02-08 DIAGNOSIS — Z48812 Encounter for surgical aftercare following surgery on the circulatory system: Secondary | ICD-10-CM | POA: Diagnosis not present

## 2012-02-08 DIAGNOSIS — I509 Heart failure, unspecified: Secondary | ICD-10-CM | POA: Diagnosis not present

## 2012-02-08 DIAGNOSIS — I2510 Atherosclerotic heart disease of native coronary artery without angina pectoris: Secondary | ICD-10-CM | POA: Diagnosis not present

## 2012-02-08 DIAGNOSIS — I2699 Other pulmonary embolism without acute cor pulmonale: Secondary | ICD-10-CM | POA: Diagnosis not present

## 2012-02-08 MED ORDER — OXYCODONE-ACETAMINOPHEN 10-325 MG PO TABS: 0.5000 | ORAL_TABLET | Freq: Three times a day (TID) | ORAL | Status: DC | PRN

## 2012-02-08 NOTE — Telephone Encounter (Signed)
Patient recently had surgery by Dr. Gretta Began in November. Was Prescribed Oxycodone by Delle Reining. Patient is running out of medication. Please advise if ok to refill. Has upcoming appointment on 12/9.

## 2012-02-08 NOTE — Telephone Encounter (Signed)
Prescription printed and put on Dr. Riley Kill desk for him to sign

## 2012-02-08 NOTE — Telephone Encounter (Signed)
We may refill x1 only

## 2012-02-09 DIAGNOSIS — Z48812 Encounter for surgical aftercare following surgery on the circulatory system: Secondary | ICD-10-CM | POA: Diagnosis not present

## 2012-02-09 DIAGNOSIS — I2699 Other pulmonary embolism without acute cor pulmonale: Secondary | ICD-10-CM | POA: Diagnosis not present

## 2012-02-09 DIAGNOSIS — I2510 Atherosclerotic heart disease of native coronary artery without angina pectoris: Secondary | ICD-10-CM | POA: Diagnosis not present

## 2012-02-09 DIAGNOSIS — I509 Heart failure, unspecified: Secondary | ICD-10-CM | POA: Diagnosis not present

## 2012-02-09 DIAGNOSIS — I10 Essential (primary) hypertension: Secondary | ICD-10-CM | POA: Diagnosis not present

## 2012-02-09 DIAGNOSIS — I739 Peripheral vascular disease, unspecified: Secondary | ICD-10-CM | POA: Diagnosis not present

## 2012-02-09 NOTE — Telephone Encounter (Signed)
Tried calling patient to let him know prescription is up front for pick up. No answer.

## 2012-02-09 NOTE — Telephone Encounter (Signed)
Called and notified patient that Oxycodone Prescription is up front for patient to pick up.

## 2012-02-11 DIAGNOSIS — I2510 Atherosclerotic heart disease of native coronary artery without angina pectoris: Secondary | ICD-10-CM | POA: Diagnosis not present

## 2012-02-11 DIAGNOSIS — I739 Peripheral vascular disease, unspecified: Secondary | ICD-10-CM | POA: Diagnosis not present

## 2012-02-11 DIAGNOSIS — I2699 Other pulmonary embolism without acute cor pulmonale: Secondary | ICD-10-CM | POA: Diagnosis not present

## 2012-02-11 DIAGNOSIS — I10 Essential (primary) hypertension: Secondary | ICD-10-CM | POA: Diagnosis not present

## 2012-02-11 DIAGNOSIS — I509 Heart failure, unspecified: Secondary | ICD-10-CM | POA: Diagnosis not present

## 2012-02-11 DIAGNOSIS — Z48812 Encounter for surgical aftercare following surgery on the circulatory system: Secondary | ICD-10-CM | POA: Diagnosis not present

## 2012-02-15 ENCOUNTER — Encounter: Payer: Medicare Other | Admitting: Physician Assistant

## 2012-02-15 DIAGNOSIS — I2699 Other pulmonary embolism without acute cor pulmonale: Secondary | ICD-10-CM | POA: Diagnosis not present

## 2012-02-15 DIAGNOSIS — I739 Peripheral vascular disease, unspecified: Secondary | ICD-10-CM | POA: Diagnosis not present

## 2012-02-15 DIAGNOSIS — Z48812 Encounter for surgical aftercare following surgery on the circulatory system: Secondary | ICD-10-CM | POA: Diagnosis not present

## 2012-02-15 DIAGNOSIS — I10 Essential (primary) hypertension: Secondary | ICD-10-CM | POA: Diagnosis not present

## 2012-02-15 DIAGNOSIS — I509 Heart failure, unspecified: Secondary | ICD-10-CM | POA: Diagnosis not present

## 2012-02-15 DIAGNOSIS — I2510 Atherosclerotic heart disease of native coronary artery without angina pectoris: Secondary | ICD-10-CM | POA: Diagnosis not present

## 2012-02-17 DIAGNOSIS — I10 Essential (primary) hypertension: Secondary | ICD-10-CM | POA: Diagnosis not present

## 2012-02-17 DIAGNOSIS — I509 Heart failure, unspecified: Secondary | ICD-10-CM | POA: Diagnosis not present

## 2012-02-17 DIAGNOSIS — I2510 Atherosclerotic heart disease of native coronary artery without angina pectoris: Secondary | ICD-10-CM | POA: Diagnosis not present

## 2012-02-17 DIAGNOSIS — I2699 Other pulmonary embolism without acute cor pulmonale: Secondary | ICD-10-CM | POA: Diagnosis not present

## 2012-02-17 DIAGNOSIS — Z48812 Encounter for surgical aftercare following surgery on the circulatory system: Secondary | ICD-10-CM | POA: Diagnosis not present

## 2012-02-17 DIAGNOSIS — I739 Peripheral vascular disease, unspecified: Secondary | ICD-10-CM | POA: Diagnosis not present

## 2012-02-21 ENCOUNTER — Encounter: Payer: Medicare Other | Admitting: Physical Therapy

## 2012-02-21 ENCOUNTER — Encounter: Payer: Medicare Other | Admitting: Physician Assistant

## 2012-02-21 ENCOUNTER — Encounter: Payer: Medicare Other | Admitting: Physical Medicine & Rehabilitation

## 2012-02-23 DIAGNOSIS — I10 Essential (primary) hypertension: Secondary | ICD-10-CM | POA: Diagnosis not present

## 2012-02-23 DIAGNOSIS — I2699 Other pulmonary embolism without acute cor pulmonale: Secondary | ICD-10-CM | POA: Diagnosis not present

## 2012-02-23 DIAGNOSIS — M1990 Unspecified osteoarthritis, unspecified site: Secondary | ICD-10-CM | POA: Diagnosis not present

## 2012-02-23 DIAGNOSIS — I714 Abdominal aortic aneurysm, without rupture: Secondary | ICD-10-CM | POA: Diagnosis not present

## 2012-02-25 ENCOUNTER — Encounter: Payer: Self-pay | Admitting: Physician Assistant

## 2012-02-25 ENCOUNTER — Ambulatory Visit (INDEPENDENT_AMBULATORY_CARE_PROVIDER_SITE_OTHER): Payer: Medicare Other | Admitting: Physician Assistant

## 2012-02-25 ENCOUNTER — Encounter: Payer: Medicare Other | Attending: Physical Medicine & Rehabilitation | Admitting: Physical Medicine & Rehabilitation

## 2012-02-25 ENCOUNTER — Encounter: Payer: Self-pay | Admitting: Physical Medicine & Rehabilitation

## 2012-02-25 VITALS — BP 122/65 | HR 76 | Resp 14 | Ht 68.0 in | Wt 195.0 lb

## 2012-02-25 VITALS — BP 118/70 | HR 77 | Ht 68.0 in | Wt 213.4 lb

## 2012-02-25 DIAGNOSIS — E669 Obesity, unspecified: Secondary | ICD-10-CM | POA: Insufficient documentation

## 2012-02-25 DIAGNOSIS — E785 Hyperlipidemia, unspecified: Secondary | ICD-10-CM | POA: Diagnosis not present

## 2012-02-25 DIAGNOSIS — Z86711 Personal history of pulmonary embolism: Secondary | ICD-10-CM | POA: Diagnosis not present

## 2012-02-25 DIAGNOSIS — N19 Unspecified kidney failure: Secondary | ICD-10-CM

## 2012-02-25 DIAGNOSIS — I2510 Atherosclerotic heart disease of native coronary artery without angina pectoris: Secondary | ICD-10-CM | POA: Diagnosis not present

## 2012-02-25 DIAGNOSIS — Z89619 Acquired absence of unspecified leg above knee: Secondary | ICD-10-CM

## 2012-02-25 DIAGNOSIS — S88919A Complete traumatic amputation of unspecified lower leg, level unspecified, initial encounter: Secondary | ICD-10-CM

## 2012-02-25 DIAGNOSIS — M79609 Pain in unspecified limb: Secondary | ICD-10-CM | POA: Diagnosis not present

## 2012-02-25 DIAGNOSIS — I10 Essential (primary) hypertension: Secondary | ICD-10-CM | POA: Diagnosis not present

## 2012-02-25 DIAGNOSIS — I214 Non-ST elevation (NSTEMI) myocardial infarction: Secondary | ICD-10-CM | POA: Diagnosis not present

## 2012-02-25 DIAGNOSIS — S78119A Complete traumatic amputation at level between unspecified hip and knee, initial encounter: Secondary | ICD-10-CM | POA: Diagnosis not present

## 2012-02-25 DIAGNOSIS — N189 Chronic kidney disease, unspecified: Secondary | ICD-10-CM

## 2012-02-25 LAB — BASIC METABOLIC PANEL
CO2: 30 mEq/L (ref 19–32)
Chloride: 100 mEq/L (ref 96–112)
Creatinine, Ser: 1.7 mg/dL — ABNORMAL HIGH (ref 0.4–1.5)

## 2012-02-25 MED ORDER — OXYCODONE-ACETAMINOPHEN 10-325 MG PO TABS: 0.5000 | ORAL_TABLET | Freq: Three times a day (TID) | ORAL | Status: DC | PRN

## 2012-02-25 NOTE — Patient Instructions (Signed)
CONTINUE WITH YOUR EXERCISES TO INCREASE YOUR STAMINA, STRENGTH, RANGE OF MOTION

## 2012-02-25 NOTE — Progress Notes (Signed)
[address], [address] [place], [place]  [address] Phone: [phone], Fax:  [phone]  Date:  02/25/2012   Name:  Kevin [name]   DOB:  1941/01/21   MRN:  [id number]  PCP:  Gabriel Cirri DO  Primary Cardiologist:  Seen by Dr. Gala Romney in the past - will est with Dr. Excell Seltzer Primary Electrophysiologist:  None    History of Present Illness: Kevin [name] is a 71 y.o. 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  He has a hx of HTN, CKD, systolic CHF, nonischemic cardiomyopathy, HL, wide-complex tachycardia. Patient was evaluated by Dr. Gala Romney in 2007 after presenting with acute CHF and a non-STEMI. LHC 06/2005: EF 20%, mid LAD 40%, mid RCA 40%. He was lost to cardiology followup.  He was admitted 10/18-10/28. He presented with an ischemic/gangrenous left leg c/b ARF and sepsis. He became hypotensive and required IV fluid resuscitation. Echocardiogram 12/31/11: EF 50-55%, paradoxical septal motion, severely dilated RV with severely reduced RV function, moderate to severe RAE, PASP 34. His findings on echocardiogram were largely suspicious for acute pulmonary embolism. VQ scan was obtained and this returned with intermediate probability for acute pulmonary embolism. Dopplers demonstrated a right leg DVT. He was treated with IV heparin.  He ultimately underwent left AKA 10/23. He was placed on Coumadin for his pulmonary embolism and also underwent placement of an IVC filter. Cardiac markers were abnormal. It was suspected that this was related to his sepsis syndrome. Outpatient noninvasive ischemic workup could be pursued after discharge. He was placed on low-dose beta blocker for evidence of nonsustained ventricular tachycardia.Patient was discharged to inpatient rehabilitation 10/28-11/11.  Patient at home now.  Doing well.  Denies chest pain.  No  significant dyspnea.  No orthopnea, PND.  No syncope.  No near syncope.  He has right leg edema that is chronic without change.    Labs (11/13):   K 4.1, creatinine 1.6, ALT 32, LDL 93, Hgb 11  Wt Readings from Last 3 Encounters:  02/25/12 213 lb 6.4 oz (96.798 kg)  02/25/12 195 lb (88.451 kg)  02/01/12 206 lb (93.441 kg)     Past Medical History  Diagnosis Date  . Arthritis   . Hypertension   . CHF (congestive heart failure)     a. NICM/acute CHF 2007 with EF 10-20%. b. Improved EF 50-55% by echo 12/2011.  Marland Kitchen Hyperlipidemia   . Coronary artery disease     a. NSTEMI 2007 with cath showing mild nonobstructive CAD by cath 2007 (40% mLAD, 40%mRCA) at time of dx of NICM.  Marland Kitchen Tortuous aorta     a. Noted by cath 2007, recommendation for OP [place].  Marland Kitchen BPH (benign prostatic hyperplasia)   . Wide-complex tachycardia     a. Noted 2007.  . Obesity   . Valvular heart disease     a. Mod MR by echo 06/2005 but no significant MR 2013.    Current Outpatient Prescriptions  Medication Sig Dispense Refill  . aspirin 81 MG chewable tablet Chew 1 tablet (81 mg total) by mouth daily.      Marland Kitchen atorvastatin (LIPITOR) 20 MG tablet Take 20 mg by mouth daily.      . carvedilol (COREG) 6.25 MG tablet Take 1 tablet (6.25 mg total) by mouth 2 (two) times daily with a meal. For heart.  60 tablet  1  . cyclobenzaprine (FLEXERIL)  5 MG tablet Take 1 tablet (5 mg total) by mouth 3 (three) times daily as needed for muscle spasms.  30 tablet  0  . dutasteride (AVODART) 0.5 MG capsule Take 0.5 mg by mouth every evening.      . hydrocerin (EUCERIN) CREA Apply 1 application topically 2 (two) times daily. To dry skin on right foot.      Marland Kitchen oxyCODONE-acetaminophen (PERCOCET) 10-325 MG per tablet Take 0.5-1 tablets by mouth every 8 (eight) hours as needed. For pain  45 tablet  0  . XARELTO 20 MG TABS Take 20 mg by mouth daily.         Allergies:   No Known Allergies  Social History:  The patient  reports that he has quit  smoking. He uses smokeless tobacco. He reports that he does not drink alcohol or use illicit drugs.   ROS:  Please see the history of present illness.   No fevers, cough, melena, hematochezia, hematuria.   All other systems reviewed and negative.   PHYSICAL EXAM: VS:  BP 118/70  Pulse 77  Ht 5\' 8"  (1.727 m)  Wt 213 lb 6.4 oz (96.798 kg)  BMI 32.45 kg/m2  SpO2 98% Well nourished, well developed, in no acute distress HEENT: normal Neck: no JVD at 90 Cardiac:  normal S1, S2; RRR; no murmur Lungs:  Decreased breath sounds bilaterally, no wheezing, rhonchi or rales Abd: soft, nontender, no hepatomegaly Ext: Left AKA; 1+ right LE edema Skin: warm and dry Neuro:  CNs 2-12 intact, no focal abnormalities noted  EKG:  NSR, HR 78, LAD, PVCs, nonspecific ST-T wave changes      ASSESSMENT AND PLAN:  1. Non-STEMI:   This was likely a type II non-STEMI related to demand ischemia in the setting of acute pulmonary embolism with RV strain as well as sepsis syndrome. LV function was normal by echocardiogram. He is not having any anginal symptoms at this time. He will require continuous anticoagulation for at least 6 months after his pulmonary embolism.Marland Kitchen He has recently been changed from Coumadin to Xarelto with his PCP. He also has chronic kidney disease. Nuclear stress testing was mentioned in the hospital records. I had a long discussion with the patient and his son today. At this point, I do not believe that stress testing would be helpful. Continue medical therapy for now. Plan followup in the next 2-3 months. We discussed warning symptoms of angina. If he has any chest discomfort or shortness of breath, he knows to return for earlier followup. As he is on Xarelto, he does not need aspirin. This will be discontinued. Continue beta blocker and statin.  2. Hypertension:   Controlled.  Continue current therapy.   3. Hyperlipidemia:   Managed by PCP.   4. Pulmonary Embolism:   Anticoagulation managed  by primary care physician.  5. Status Post Left AKA:   He is due to see physical therapy next week to get fitted for prosthetics.  6. Chronic Kidney Disease:   Obtain repeat basic metabolic panel today.  7. Disposition:   Patient was seen several times by multiple cardiologists in the hospital. Dr. Excell Seltzer saw him more consistently than any one else, have him see Dr. Excell Seltzer back in followup in 2-3 months.  Luna Glasgow, PA-C  11:09 AM 02/25/2012

## 2012-02-25 NOTE — Patient Instructions (Addendum)
Your physician recommends that you return for lab work in: today  Your physician has recommended you make the following change in your medication: stop taking Aspirin  Your physician recommends that you schedule a follow-up appointment in: 2 to 3 months

## 2012-02-25 NOTE — Progress Notes (Signed)
Subjective:    Patient ID: Kevin [name], male    DOB: 1940-08-20, 71 y.o.   MRN: [id number]  HPI Kevin [name] is here in follow up of his left AKA. He has been exercising at home as therapy stopped coming. He has walked up to 30 ft at home.   He has received his shrinker from [employer]. He is wearing it daily. He has had no problems with the wound. His pain is minimal at this point. He uses percocet only when he exercises.  When he receives his prosthesis he would like to get out of the house occasionally, visit friends, short dx walking within the house, cook etc.   Pain Inventory Average Pain 3 Pain Right Now 0 My pain is intermittent  In the last 24 hours, has pain interfered with the following? General activity 0 Relation with others 0 Enjoyment of life 0 What TIME of day is your pain at its worst? evening Sleep (in general) Poor  Pain is worse with: some activites Pain improves with: medication Relief from Meds: 9  Mobility walk with assistance ability to climb steps?  no do you drive?  no use a wheelchair  Function disabled: date disabled 2006 I need assistance with the following:  meal prep, household duties and shopping  Neuro/Psych trouble walking spasms  Prior Studies Any changes since last visit?  no  Physicians involved in your care Any changes since last visit?  no   Family History  Problem Relation Age of Onset  . Heart attack Mother     In her 70's  . Stroke Mother     In her 67's  . CAD Brother    History   Social History  . Marital Status: Legally Separated    Spouse Name: N/A    Number of Children: N/A  . Years of Education: N/A   Social History Main Topics  . Smoking status: Former Games developer  . Smokeless tobacco: Current User  . Alcohol Use: No  . Drug Use: No  . Sexually Active: None   Other Topics Concern  . None   Social History Narrative  . None   Past Surgical History  Procedure Date  . No past surgeries   . Vena  cava filter placement 01/03/2012    Procedure: INSERTION VENA-CAVA FILTER;  Surgeon: Larina Earthly, MD;  Location[facility] OR;  Service: Vascular;  Laterality: Left;  . Amputation 01/05/2012    Procedure: AMPUTATION ABOVE KNEE;  Surgeon: Larina Earthly, MD;  Location: [facility] OR;  Service: Vascular;  Laterality: Left;   Past Medical History  Diagnosis Date  . Arthritis   . Hypertension   . CHF (congestive heart failure)     a. NICM/acute CHF 2007 with EF 10-20%. b. Improved EF 50-55% by echo 12/2011.  Marland Kitchen Hyperlipidemia   . Coronary artery disease     a. NSTEMI 2007 with cath showing mild nonobstructive CAD by cath 2007 (40% mLAD, 40%mRCA) at time of dx of NICM.  Marland Kitchen Tortuous aorta     a. Noted by cath 2007, recommendation for OP [place].  Marland Kitchen BPH (benign prostatic hyperplasia)   . Wide-complex tachycardia     a. Noted 2007.  . Obesity   . Valvular heart disease     a. Mod Kevin by echo 06/2005 but no significant Kevin 2013.   BP 122/65  Pulse 76  Resp 14  Ht 5\' 8"  (1.727 m)  Wt 195 lb (88.451 kg)  BMI 29.65 kg/m2  SpO2 100%    Review of Systems  Musculoskeletal: Positive for gait problem.  Skin: Positive for rash.  Neurological:       Spasms  Hematological: Bruises/bleeds easily.  All other systems reviewed and are negative.       Objective:   Physical Exam  General: Alert and oriented x 3, No apparent distress HEENT: Head is normocephalic, atraumatic, PERRLA, EOMI, sclera anicteric, oral mucosa pink and moist, dentition intact, ext ear canals clear,  Neck: Supple without JVD or lymphadenopathy Heart: Reg rate and rhythm. No murmurs rubs or gallops Chest: CTA bilaterally without wheezes, rales, or rhonchi; no distress Abdomen: Soft, non-tender, non-distended, bowel sounds positive. Extremities: No clubbing, cyanosis, 1+ edema RLE . Pulses are 2+ Skin: Clean and intact without signs of breakdown Neuro: Pt is cognitively appropriate with normal insight, memory, and awareness. Cranial nerves  2-12 are intact. Sensory exam is normal. Reflexes are 2+ in all 4's. Fine motor coordination is intact. No tremors. Motor function is grossly 5/5.  Musculoskeletal: left leg well shaped. Wound closed. Nearly full flexion and extension at the hip Psych: Pt's affect is appropriate. Pt is cooperative         Assessment & Plan:  1. Left AKA-  -he is a K2 ambulator  -he is motivated to use leg and to proceed with therapy to use the leg.  -discussed basic stretching and exercises for at home  -i provided him an rx for a prosthesis today  -i would be happy to rx therapy when he's ready. 2.Pain  -really at this point, his pain is minimal  -did refill #45 percocet. If he wants more he will have to return to our office  3. Follow up with me PRN. He is doing extremely well

## 2012-02-29 ENCOUNTER — Ambulatory Visit: Payer: Medicare Other | Admitting: Physical Therapy

## 2012-03-02 ENCOUNTER — Ambulatory Visit: Payer: Medicare Other | Attending: Vascular Surgery | Admitting: Physical Therapy

## 2012-03-02 DIAGNOSIS — IMO0001 Reserved for inherently not codable concepts without codable children: Secondary | ICD-10-CM | POA: Insufficient documentation

## 2012-03-02 DIAGNOSIS — S78119A Complete traumatic amputation at level between unspecified hip and knee, initial encounter: Secondary | ICD-10-CM | POA: Insufficient documentation

## 2012-03-02 DIAGNOSIS — R5381 Other malaise: Secondary | ICD-10-CM | POA: Diagnosis not present

## 2012-03-02 DIAGNOSIS — R269 Unspecified abnormalities of gait and mobility: Secondary | ICD-10-CM | POA: Diagnosis not present

## 2012-03-02 DIAGNOSIS — M6281 Muscle weakness (generalized): Secondary | ICD-10-CM | POA: Insufficient documentation

## 2012-03-10 DIAGNOSIS — N400 Enlarged prostate without lower urinary tract symptoms: Secondary | ICD-10-CM | POA: Diagnosis not present

## 2012-03-10 DIAGNOSIS — M1990 Unspecified osteoarthritis, unspecified site: Secondary | ICD-10-CM | POA: Diagnosis not present

## 2012-04-07 ENCOUNTER — Telehealth: Payer: Self-pay | Admitting: Physical Medicine & Rehabilitation

## 2012-04-07 DIAGNOSIS — S78119A Complete traumatic amputation at level between unspecified hip and knee, initial encounter: Secondary | ICD-10-CM

## 2012-04-07 NOTE — Telephone Encounter (Signed)
Olegario Messier with [facility] needs script for [employer] (PT) please fax [phone]

## 2012-04-07 NOTE — Telephone Encounter (Signed)
Order was faxed to [facility].

## 2012-04-20 ENCOUNTER — Ambulatory Visit: Payer: Medicare Other | Attending: Vascular Surgery | Admitting: Physical Therapy

## 2012-04-20 DIAGNOSIS — S78119A Complete traumatic amputation at level between unspecified hip and knee, initial encounter: Secondary | ICD-10-CM | POA: Diagnosis not present

## 2012-04-20 DIAGNOSIS — M6281 Muscle weakness (generalized): Secondary | ICD-10-CM | POA: Insufficient documentation

## 2012-04-20 DIAGNOSIS — R269 Unspecified abnormalities of gait and mobility: Secondary | ICD-10-CM | POA: Insufficient documentation

## 2012-04-20 DIAGNOSIS — IMO0001 Reserved for inherently not codable concepts without codable children: Secondary | ICD-10-CM | POA: Insufficient documentation

## 2012-04-20 DIAGNOSIS — R5381 Other malaise: Secondary | ICD-10-CM | POA: Insufficient documentation

## 2012-04-24 ENCOUNTER — Ambulatory Visit: Payer: Medicare Other | Admitting: Physical Therapy

## 2012-04-26 ENCOUNTER — Ambulatory Visit: Payer: Medicare Other | Admitting: Physical Therapy

## 2012-05-01 ENCOUNTER — Ambulatory Visit: Payer: Medicare Other | Admitting: Physical Therapy

## 2012-05-04 ENCOUNTER — Ambulatory Visit: Payer: Medicare Other | Admitting: Physical Therapy

## 2012-05-08 ENCOUNTER — Ambulatory Visit: Payer: Medicare Other | Admitting: Physical Therapy

## 2012-05-09 ENCOUNTER — Ambulatory Visit: Payer: Medicare Other | Admitting: Cardiovascular Disease

## 2012-05-09 ENCOUNTER — Ambulatory Visit: Payer: Medicare Other | Admitting: Physical Therapy

## 2012-05-10 ENCOUNTER — Ambulatory Visit: Payer: Medicare Other | Admitting: Physical Therapy

## 2012-05-15 ENCOUNTER — Ambulatory Visit: Payer: Medicare Other | Attending: Vascular Surgery | Admitting: Physical Therapy

## 2012-05-15 DIAGNOSIS — R269 Unspecified abnormalities of gait and mobility: Secondary | ICD-10-CM | POA: Insufficient documentation

## 2012-05-15 DIAGNOSIS — S78119A Complete traumatic amputation at level between unspecified hip and knee, initial encounter: Secondary | ICD-10-CM | POA: Insufficient documentation

## 2012-05-15 DIAGNOSIS — IMO0001 Reserved for inherently not codable concepts without codable children: Secondary | ICD-10-CM | POA: Insufficient documentation

## 2012-05-15 DIAGNOSIS — R5381 Other malaise: Secondary | ICD-10-CM | POA: Insufficient documentation

## 2012-05-15 DIAGNOSIS — M6281 Muscle weakness (generalized): Secondary | ICD-10-CM | POA: Insufficient documentation

## 2012-05-17 ENCOUNTER — Ambulatory Visit: Payer: Medicare Other | Admitting: Physical Therapy

## 2012-05-22 ENCOUNTER — Ambulatory Visit: Payer: Medicare Other | Admitting: Physical Therapy

## 2012-05-24 ENCOUNTER — Ambulatory Visit: Payer: Medicare Other | Admitting: Physical Therapy

## 2012-05-24 DIAGNOSIS — M6281 Muscle weakness (generalized): Secondary | ICD-10-CM | POA: Diagnosis not present

## 2012-05-24 DIAGNOSIS — R269 Unspecified abnormalities of gait and mobility: Secondary | ICD-10-CM | POA: Diagnosis not present

## 2012-05-24 DIAGNOSIS — R5381 Other malaise: Secondary | ICD-10-CM | POA: Diagnosis not present

## 2012-05-29 ENCOUNTER — Ambulatory Visit: Payer: Medicare Other | Admitting: Physical Therapy

## 2012-05-29 DIAGNOSIS — R269 Unspecified abnormalities of gait and mobility: Secondary | ICD-10-CM | POA: Diagnosis not present

## 2012-05-29 DIAGNOSIS — R5381 Other malaise: Secondary | ICD-10-CM | POA: Diagnosis not present

## 2012-05-29 DIAGNOSIS — S78119A Complete traumatic amputation at level between unspecified hip and knee, initial encounter: Secondary | ICD-10-CM | POA: Diagnosis not present

## 2012-05-29 DIAGNOSIS — M6281 Muscle weakness (generalized): Secondary | ICD-10-CM | POA: Diagnosis not present

## 2012-05-30 ENCOUNTER — Ambulatory Visit: Payer: Medicare Other | Admitting: Cardiovascular Disease

## 2012-05-31 ENCOUNTER — Ambulatory Visit: Payer: Medicare Other | Admitting: Physical Therapy

## 2012-06-05 ENCOUNTER — Ambulatory Visit: Payer: Medicare Other | Admitting: Physical Therapy

## 2012-06-05 DIAGNOSIS — R5381 Other malaise: Secondary | ICD-10-CM | POA: Diagnosis not present

## 2012-06-05 DIAGNOSIS — S78119A Complete traumatic amputation at level between unspecified hip and knee, initial encounter: Secondary | ICD-10-CM | POA: Diagnosis not present

## 2012-06-05 DIAGNOSIS — R269 Unspecified abnormalities of gait and mobility: Secondary | ICD-10-CM | POA: Diagnosis not present

## 2012-06-05 DIAGNOSIS — IMO0001 Reserved for inherently not codable concepts without codable children: Secondary | ICD-10-CM | POA: Diagnosis not present

## 2012-06-05 DIAGNOSIS — M6281 Muscle weakness (generalized): Secondary | ICD-10-CM | POA: Diagnosis not present

## 2012-06-06 ENCOUNTER — Encounter: Payer: Medicare Other | Admitting: Physical Therapy

## 2012-06-08 ENCOUNTER — Ambulatory Visit: Payer: Medicare Other | Admitting: Physical Therapy

## 2012-06-08 DIAGNOSIS — M6281 Muscle weakness (generalized): Secondary | ICD-10-CM | POA: Diagnosis not present

## 2012-06-08 DIAGNOSIS — R269 Unspecified abnormalities of gait and mobility: Secondary | ICD-10-CM | POA: Diagnosis not present

## 2012-06-08 DIAGNOSIS — S78119A Complete traumatic amputation at level between unspecified hip and knee, initial encounter: Secondary | ICD-10-CM | POA: Diagnosis not present

## 2012-06-08 DIAGNOSIS — R5381 Other malaise: Secondary | ICD-10-CM | POA: Diagnosis not present

## 2012-06-08 DIAGNOSIS — IMO0001 Reserved for inherently not codable concepts without codable children: Secondary | ICD-10-CM | POA: Diagnosis not present

## 2012-06-12 ENCOUNTER — Ambulatory Visit: Payer: Medicare Other | Admitting: Physical Therapy

## 2012-06-12 DIAGNOSIS — M6281 Muscle weakness (generalized): Secondary | ICD-10-CM | POA: Diagnosis not present

## 2012-06-12 DIAGNOSIS — IMO0001 Reserved for inherently not codable concepts without codable children: Secondary | ICD-10-CM | POA: Diagnosis not present

## 2012-06-12 DIAGNOSIS — S78119A Complete traumatic amputation at level between unspecified hip and knee, initial encounter: Secondary | ICD-10-CM | POA: Diagnosis not present

## 2012-06-12 DIAGNOSIS — R5381 Other malaise: Secondary | ICD-10-CM | POA: Diagnosis not present

## 2012-06-12 DIAGNOSIS — R269 Unspecified abnormalities of gait and mobility: Secondary | ICD-10-CM | POA: Diagnosis not present

## 2012-06-14 ENCOUNTER — Ambulatory Visit: Payer: Medicare Other | Attending: Vascular Surgery | Admitting: Physical Therapy

## 2012-06-14 DIAGNOSIS — S78119A Complete traumatic amputation at level between unspecified hip and knee, initial encounter: Secondary | ICD-10-CM | POA: Diagnosis not present

## 2012-06-14 DIAGNOSIS — R269 Unspecified abnormalities of gait and mobility: Secondary | ICD-10-CM | POA: Insufficient documentation

## 2012-06-14 DIAGNOSIS — IMO0001 Reserved for inherently not codable concepts without codable children: Secondary | ICD-10-CM | POA: Diagnosis not present

## 2012-06-14 DIAGNOSIS — M6281 Muscle weakness (generalized): Secondary | ICD-10-CM | POA: Insufficient documentation

## 2012-06-14 DIAGNOSIS — R5381 Other malaise: Secondary | ICD-10-CM | POA: Diagnosis not present

## 2012-06-20 ENCOUNTER — Ambulatory Visit: Payer: Medicare Other | Admitting: Physical Therapy

## 2012-06-20 DIAGNOSIS — IMO0001 Reserved for inherently not codable concepts without codable children: Secondary | ICD-10-CM | POA: Diagnosis not present

## 2012-06-20 DIAGNOSIS — R269 Unspecified abnormalities of gait and mobility: Secondary | ICD-10-CM | POA: Diagnosis not present

## 2012-06-20 DIAGNOSIS — S78119A Complete traumatic amputation at level between unspecified hip and knee, initial encounter: Secondary | ICD-10-CM | POA: Diagnosis not present

## 2012-06-20 DIAGNOSIS — R5381 Other malaise: Secondary | ICD-10-CM | POA: Diagnosis not present

## 2012-06-20 DIAGNOSIS — M6281 Muscle weakness (generalized): Secondary | ICD-10-CM | POA: Diagnosis not present

## 2012-06-22 ENCOUNTER — Ambulatory Visit: Payer: Medicare Other | Admitting: Physical Therapy

## 2012-06-22 DIAGNOSIS — IMO0001 Reserved for inherently not codable concepts without codable children: Secondary | ICD-10-CM | POA: Diagnosis not present

## 2012-06-22 DIAGNOSIS — M6281 Muscle weakness (generalized): Secondary | ICD-10-CM | POA: Diagnosis not present

## 2012-06-22 DIAGNOSIS — R5381 Other malaise: Secondary | ICD-10-CM | POA: Diagnosis not present

## 2012-06-22 DIAGNOSIS — S78119A Complete traumatic amputation at level between unspecified hip and knee, initial encounter: Secondary | ICD-10-CM | POA: Diagnosis not present

## 2012-06-22 DIAGNOSIS — R269 Unspecified abnormalities of gait and mobility: Secondary | ICD-10-CM | POA: Diagnosis not present

## 2012-06-26 ENCOUNTER — Ambulatory Visit (INDEPENDENT_AMBULATORY_CARE_PROVIDER_SITE_OTHER): Payer: Medicare Other | Admitting: Cardiovascular Disease

## 2012-06-26 ENCOUNTER — Encounter: Payer: Self-pay | Admitting: Cardiovascular Disease

## 2012-06-26 VITALS — BP 137/67 | HR 75 | Ht 67.0 in | Wt 183.0 lb

## 2012-06-26 DIAGNOSIS — I509 Heart failure, unspecified: Secondary | ICD-10-CM | POA: Diagnosis not present

## 2012-06-26 DIAGNOSIS — I10 Essential (primary) hypertension: Secondary | ICD-10-CM

## 2012-06-26 DIAGNOSIS — I2510 Atherosclerotic heart disease of native coronary artery without angina pectoris: Secondary | ICD-10-CM

## 2012-06-26 NOTE — Patient Instructions (Addendum)
Your physician wants you to follow-up in: 6 MONTHS with Dr Cooper.  You will receive a reminder letter in the mail two months in advance. If you don't receive a letter, please call our office to schedule the follow-up appointment.  Your physician has requested that you have an echocardiogram in 6 MONTHS. Echocardiography is a painless test that uses sound waves to create images of your heart. It provides your doctor with information about the size and shape of your heart and how well your heart's chambers and valves are working. This procedure takes approximately one hour. There are no restrictions for this procedure.  Your physician recommends that you continue on your current medications as directed. Please refer to the Current Medication list given to you today.   

## 2012-06-27 ENCOUNTER — Ambulatory Visit: Payer: Medicare Other | Admitting: Physical Therapy

## 2012-06-28 ENCOUNTER — Encounter: Payer: Self-pay | Admitting: Cardiovascular Disease

## 2012-06-28 NOTE — Progress Notes (Signed)
HPI:   72 year old gentleman presenting for followup evaluation. The patient had a long hospitalization in October 2013 when he presented with left leg gangrene, acute renal failure, and sepsis. He underwent left above-knee amputation. During the course of his evaluation in the intensive care unit, he underwent an echocardiogram. This demonstrated a left ventricular ejection fraction of 50-55%. There was paradoxical septal motion. Right ventricle is severely dilated with severe dysfunction. VQ scan was intermediate probability for acute pulmonary embolism and considering right heart findings this was suspected. He was also noted to have a right leg DVT. An IVC filter was placed. He was initially treated with warfarin but did not tolerate this well. He was switched to Xarelto and has tolerated this well without bleeding complications.   He denies chest pain or pressure. He denies lightheadedness, palpitations, or syncope. He's had mild right leg swelling. He's been retraining to walk with his prosthesis. He is limited by shortness of breath. He denies orthopnea, PND, fevers, chills, or cough. He's been compliant with his medications  Outpatient Encounter Prescriptions as of 06/26/2012  Medication Sig Dispense Refill  . atorvastatin (LIPITOR) 20 MG tablet Take 20 mg by mouth daily.      . carvedilol (COREG) 6.25 MG tablet Take 1 tablet (6.25 mg total) by mouth 2 (two) times daily with a meal. For heart.  60 tablet  1  . cyclobenzaprine (FLEXERIL) 5 MG tablet Take 1 tablet (5 mg total) by mouth 3 (three) times daily as needed for muscle spasms.  30 tablet  0  . diazepam (VALIUM) 5 MG tablet       . furosemide (LASIX) 40 MG tablet       . hydrocerin (EUCERIN) CREA Apply 1 application topically 2 (two) times daily. To dry skin on right foot.      Marland Kitchen oxyCODONE-acetaminophen (PERCOCET) 10-325 MG per tablet Take 0.5-1 tablets by mouth every 8 (eight) hours as needed. For pain  45 tablet  0  . tamsulosin  (FLOMAX) 0.4 MG CAPS       . XARELTO 20 MG TABS Take 20 mg by mouth daily.       . [DISCONTINUED] dutasteride (AVODART) 0.5 MG capsule Take 0.5 mg by mouth every evening.       No facility-administered encounter medications on file as of 06/26/2012.    No Known Allergies  Past Medical History  Diagnosis Date  . Arthritis   . Hypertension   . CHF (congestive heart failure)     a. NICM/acute CHF 2007 with EF 10-20%. b. Improved EF 50-55% by echo 12/2011.  Marland Kitchen Hyperlipidemia   . Coronary artery disease     a. NSTEMI 2007 with cath showing mild nonobstructive CAD by cath 2007 (40% mLAD, 40%mRCA) at time of dx of NICM.  Marland Kitchen Tortuous aorta     a. Noted by cath 2007, recommendation for OP [place].  Marland Kitchen BPH (benign prostatic hyperplasia)   . Wide-complex tachycardia     a. Noted 2007.  . Obesity   . Valvular heart disease     a. Mod MR by echo 06/2005 but no significant MR 2013.   ROS: Negative except as per HPI  BP 137/67  Pulse 75  Ht 5\' 7"  (1.702 m)  Wt 83.008 kg (183 lb)  BMI 28.66 kg/m2  PHYSICAL EXAM: Pt is alert and oriented, NAD HEENT: normal Neck: JVP - normal, carotids 2+= without bruits Lungs: CTA bilaterally CV: RRR without murmur or gallop Abd: soft, NT, Positive BS,  no hepatomegaly Ext: 1+ edema right leg, left leg AKA Skin: warm/dry no rash  EKG:  Sinus rhythm with left IVCD, nonspecific ST and T wave abnormality, prolonged QT, frequent PVCs.  ASSESSMENT AND PLAN: 1. Pulmonary embolus. The patient had life-threatening pulmonary embolus with significant right heart dysfunction. He's not had a left AKA. I think he has a significant ongoing risk and would recommend lifelong anticoagulation therapy as tolerated. He is in agreement with this plan.  2. Nonsustained VT. He's had no palpitations, lightheadedness, or syncope. He does have frequent PVCs on his EKG. Will continue carvedilol.  3. Hyperlipidemia. He is on a statin drug.  For followup I would like to see him back in  6 months. We'll check an echocardiogram before he returns to reassess right ventricular function.  Tonny Bollman 06/28/2012 4:11 PM

## 2012-06-29 ENCOUNTER — Encounter: Payer: Medicare Other | Admitting: Physical Therapy

## 2012-07-03 DIAGNOSIS — M48061 Spinal stenosis, lumbar region without neurogenic claudication: Secondary | ICD-10-CM | POA: Diagnosis not present

## 2012-07-03 DIAGNOSIS — F411 Generalized anxiety disorder: Secondary | ICD-10-CM | POA: Diagnosis not present

## 2012-07-03 DIAGNOSIS — M25519 Pain in unspecified shoulder: Secondary | ICD-10-CM | POA: Diagnosis not present

## 2012-07-04 ENCOUNTER — Encounter: Payer: Medicare Other | Admitting: Physical Therapy

## 2012-07-06 ENCOUNTER — Encounter: Payer: Medicare Other | Admitting: Physical Therapy

## 2012-07-11 ENCOUNTER — Ambulatory Visit: Payer: Medicare Other | Admitting: Physical Therapy

## 2012-07-13 ENCOUNTER — Ambulatory Visit: Payer: Medicare Other | Attending: Vascular Surgery | Admitting: Physical Therapy

## 2012-07-13 DIAGNOSIS — S78119A Complete traumatic amputation at level between unspecified hip and knee, initial encounter: Secondary | ICD-10-CM | POA: Insufficient documentation

## 2012-07-13 DIAGNOSIS — R5381 Other malaise: Secondary | ICD-10-CM | POA: Insufficient documentation

## 2012-07-13 DIAGNOSIS — R269 Unspecified abnormalities of gait and mobility: Secondary | ICD-10-CM | POA: Insufficient documentation

## 2012-07-13 DIAGNOSIS — M6281 Muscle weakness (generalized): Secondary | ICD-10-CM | POA: Insufficient documentation

## 2012-07-13 DIAGNOSIS — IMO0001 Reserved for inherently not codable concepts without codable children: Secondary | ICD-10-CM | POA: Insufficient documentation

## 2012-08-02 DIAGNOSIS — I10 Essential (primary) hypertension: Secondary | ICD-10-CM | POA: Diagnosis not present

## 2012-08-02 DIAGNOSIS — M48061 Spinal stenosis, lumbar region without neurogenic claudication: Secondary | ICD-10-CM | POA: Diagnosis not present

## 2012-08-02 DIAGNOSIS — N400 Enlarged prostate without lower urinary tract symptoms: Secondary | ICD-10-CM | POA: Diagnosis not present

## 2012-08-02 DIAGNOSIS — J449 Chronic obstructive pulmonary disease, unspecified: Secondary | ICD-10-CM | POA: Diagnosis not present

## 2012-08-02 DIAGNOSIS — I2510 Atherosclerotic heart disease of native coronary artery without angina pectoris: Secondary | ICD-10-CM | POA: Diagnosis not present

## 2012-08-31 DIAGNOSIS — M48061 Spinal stenosis, lumbar region without neurogenic claudication: Secondary | ICD-10-CM | POA: Diagnosis not present

## 2012-09-26 DIAGNOSIS — M48061 Spinal stenosis, lumbar region without neurogenic claudication: Secondary | ICD-10-CM | POA: Diagnosis not present

## 2012-09-26 DIAGNOSIS — I10 Essential (primary) hypertension: Secondary | ICD-10-CM | POA: Diagnosis not present

## 2012-09-26 DIAGNOSIS — F411 Generalized anxiety disorder: Secondary | ICD-10-CM | POA: Diagnosis not present

## 2012-10-27 DIAGNOSIS — N289 Disorder of kidney and ureter, unspecified: Secondary | ICD-10-CM | POA: Diagnosis not present

## 2012-10-27 DIAGNOSIS — J449 Chronic obstructive pulmonary disease, unspecified: Secondary | ICD-10-CM | POA: Diagnosis not present

## 2012-10-27 DIAGNOSIS — F411 Generalized anxiety disorder: Secondary | ICD-10-CM | POA: Diagnosis not present

## 2012-10-27 DIAGNOSIS — G8929 Other chronic pain: Secondary | ICD-10-CM | POA: Diagnosis not present

## 2012-11-23 DIAGNOSIS — I10 Essential (primary) hypertension: Secondary | ICD-10-CM | POA: Diagnosis not present

## 2012-11-23 DIAGNOSIS — J449 Chronic obstructive pulmonary disease, unspecified: Secondary | ICD-10-CM | POA: Diagnosis not present

## 2012-11-23 DIAGNOSIS — M48061 Spinal stenosis, lumbar region without neurogenic claudication: Secondary | ICD-10-CM | POA: Diagnosis not present

## 2012-11-23 DIAGNOSIS — M1990 Unspecified osteoarthritis, unspecified site: Secondary | ICD-10-CM | POA: Diagnosis not present

## 2012-12-22 DIAGNOSIS — J449 Chronic obstructive pulmonary disease, unspecified: Secondary | ICD-10-CM | POA: Diagnosis not present

## 2012-12-22 DIAGNOSIS — F411 Generalized anxiety disorder: Secondary | ICD-10-CM | POA: Diagnosis not present

## 2012-12-22 DIAGNOSIS — G8929 Other chronic pain: Secondary | ICD-10-CM | POA: Diagnosis not present

## 2012-12-22 DIAGNOSIS — N390 Urinary tract infection, site not specified: Secondary | ICD-10-CM | POA: Diagnosis not present

## 2013-01-19 DIAGNOSIS — J449 Chronic obstructive pulmonary disease, unspecified: Secondary | ICD-10-CM | POA: Diagnosis not present

## 2013-01-19 DIAGNOSIS — F411 Generalized anxiety disorder: Secondary | ICD-10-CM | POA: Diagnosis not present

## 2013-01-19 DIAGNOSIS — G8929 Other chronic pain: Secondary | ICD-10-CM | POA: Diagnosis not present

## 2013-02-06 DIAGNOSIS — I8310 Varicose veins of unspecified lower extremity with inflammation: Secondary | ICD-10-CM | POA: Diagnosis not present

## 2013-02-06 DIAGNOSIS — I2510 Atherosclerotic heart disease of native coronary artery without angina pectoris: Secondary | ICD-10-CM | POA: Diagnosis not present

## 2013-02-06 DIAGNOSIS — S88919A Complete traumatic amputation of unspecified lower leg, level unspecified, initial encounter: Secondary | ICD-10-CM | POA: Diagnosis not present

## 2013-02-15 DIAGNOSIS — G8929 Other chronic pain: Secondary | ICD-10-CM | POA: Diagnosis not present

## 2013-02-15 DIAGNOSIS — N189 Chronic kidney disease, unspecified: Secondary | ICD-10-CM | POA: Diagnosis not present

## 2013-02-15 DIAGNOSIS — F411 Generalized anxiety disorder: Secondary | ICD-10-CM | POA: Diagnosis not present

## 2013-02-15 DIAGNOSIS — F17200 Nicotine dependence, unspecified, uncomplicated: Secondary | ICD-10-CM | POA: Diagnosis not present

## 2013-02-15 DIAGNOSIS — Z23 Encounter for immunization: Secondary | ICD-10-CM | POA: Diagnosis not present

## 2013-03-22 DIAGNOSIS — G8929 Other chronic pain: Secondary | ICD-10-CM | POA: Diagnosis not present

## 2013-03-22 DIAGNOSIS — F411 Generalized anxiety disorder: Secondary | ICD-10-CM | POA: Diagnosis not present

## 2013-04-20 DIAGNOSIS — F411 Generalized anxiety disorder: Secondary | ICD-10-CM | POA: Diagnosis not present

## 2013-04-20 DIAGNOSIS — G8929 Other chronic pain: Secondary | ICD-10-CM | POA: Diagnosis not present

## 2013-04-24 DIAGNOSIS — N400 Enlarged prostate without lower urinary tract symptoms: Secondary | ICD-10-CM | POA: Diagnosis not present

## 2013-04-24 DIAGNOSIS — I10 Essential (primary) hypertension: Secondary | ICD-10-CM | POA: Diagnosis not present

## 2013-04-24 DIAGNOSIS — I714 Abdominal aortic aneurysm, without rupture, unspecified: Secondary | ICD-10-CM | POA: Diagnosis not present

## 2013-04-24 DIAGNOSIS — N390 Urinary tract infection, site not specified: Secondary | ICD-10-CM | POA: Diagnosis not present

## 2013-04-24 DIAGNOSIS — N183 Chronic kidney disease, stage 3 unspecified: Secondary | ICD-10-CM | POA: Diagnosis not present

## 2013-05-18 DIAGNOSIS — F411 Generalized anxiety disorder: Secondary | ICD-10-CM | POA: Diagnosis not present

## 2013-05-18 DIAGNOSIS — G8929 Other chronic pain: Secondary | ICD-10-CM | POA: Diagnosis not present

## 2013-05-18 DIAGNOSIS — J40 Bronchitis, not specified as acute or chronic: Secondary | ICD-10-CM | POA: Diagnosis not present

## 2013-06-18 DIAGNOSIS — G8929 Other chronic pain: Secondary | ICD-10-CM | POA: Diagnosis not present

## 2013-06-18 DIAGNOSIS — R791 Abnormal coagulation profile: Secondary | ICD-10-CM | POA: Diagnosis not present

## 2013-06-18 DIAGNOSIS — F411 Generalized anxiety disorder: Secondary | ICD-10-CM | POA: Diagnosis not present

## 2013-07-19 DIAGNOSIS — R791 Abnormal coagulation profile: Secondary | ICD-10-CM | POA: Diagnosis not present

## 2013-07-19 DIAGNOSIS — G8929 Other chronic pain: Secondary | ICD-10-CM | POA: Diagnosis not present

## 2013-07-19 DIAGNOSIS — F411 Generalized anxiety disorder: Secondary | ICD-10-CM | POA: Diagnosis not present

## 2013-08-16 DIAGNOSIS — R791 Abnormal coagulation profile: Secondary | ICD-10-CM | POA: Diagnosis not present

## 2013-08-16 DIAGNOSIS — F411 Generalized anxiety disorder: Secondary | ICD-10-CM | POA: Diagnosis not present

## 2013-08-16 DIAGNOSIS — G8929 Other chronic pain: Secondary | ICD-10-CM | POA: Diagnosis not present

## 2013-08-16 DIAGNOSIS — I10 Essential (primary) hypertension: Secondary | ICD-10-CM | POA: Diagnosis not present

## 2013-09-13 DIAGNOSIS — F411 Generalized anxiety disorder: Secondary | ICD-10-CM | POA: Diagnosis not present

## 2013-09-13 DIAGNOSIS — G8929 Other chronic pain: Secondary | ICD-10-CM | POA: Diagnosis not present

## 2013-10-12 DIAGNOSIS — I10 Essential (primary) hypertension: Secondary | ICD-10-CM | POA: Diagnosis not present

## 2013-10-12 DIAGNOSIS — R791 Abnormal coagulation profile: Secondary | ICD-10-CM | POA: Diagnosis not present

## 2013-10-12 DIAGNOSIS — F411 Generalized anxiety disorder: Secondary | ICD-10-CM | POA: Diagnosis not present

## 2013-10-12 DIAGNOSIS — N189 Chronic kidney disease, unspecified: Secondary | ICD-10-CM | POA: Diagnosis not present

## 2013-10-12 DIAGNOSIS — Z9181 History of falling: Secondary | ICD-10-CM | POA: Diagnosis not present

## 2013-10-12 DIAGNOSIS — G8929 Other chronic pain: Secondary | ICD-10-CM | POA: Diagnosis not present

## 2013-10-12 DIAGNOSIS — J449 Chronic obstructive pulmonary disease, unspecified: Secondary | ICD-10-CM | POA: Diagnosis not present

## 2013-11-09 DIAGNOSIS — F411 Generalized anxiety disorder: Secondary | ICD-10-CM | POA: Diagnosis not present

## 2013-11-09 DIAGNOSIS — G8929 Other chronic pain: Secondary | ICD-10-CM | POA: Diagnosis not present

## 2013-12-04 ENCOUNTER — Encounter (HOSPITAL_COMMUNITY): Payer: Self-pay | Admitting: Emergency Medicine

## 2013-12-04 ENCOUNTER — Emergency Department (HOSPITAL_COMMUNITY): Payer: Medicare Other

## 2013-12-04 ENCOUNTER — Inpatient Hospital Stay (HOSPITAL_COMMUNITY)
Admission: EM | Admit: 2013-12-04 | Discharge: 2013-12-13 | DRG: 025 | Disposition: A | Discharge status: Rehabilitation Facility | Payer: Medicare Other | Attending: Neurosurgery | Admitting: Neurosurgery

## 2013-12-04 DIAGNOSIS — R5381 Other malaise: Secondary | ICD-10-CM | POA: Diagnosis not present

## 2013-12-04 DIAGNOSIS — I498 Other specified cardiac arrhythmias: Secondary | ICD-10-CM

## 2013-12-04 DIAGNOSIS — R4182 Altered mental status, unspecified: Secondary | ICD-10-CM | POA: Diagnosis not present

## 2013-12-04 DIAGNOSIS — I10 Essential (primary) hypertension: Secondary | ICD-10-CM | POA: Diagnosis not present

## 2013-12-04 DIAGNOSIS — I9589 Other hypotension: Secondary | ICD-10-CM

## 2013-12-04 DIAGNOSIS — R5383 Other fatigue: Secondary | ICD-10-CM | POA: Diagnosis not present

## 2013-12-04 DIAGNOSIS — I5032 Chronic diastolic (congestive) heart failure: Secondary | ICD-10-CM | POA: Diagnosis present

## 2013-12-04 DIAGNOSIS — N189 Chronic kidney disease, unspecified: Secondary | ICD-10-CM | POA: Diagnosis present

## 2013-12-04 DIAGNOSIS — E871 Hypo-osmolality and hyponatremia: Secondary | ICD-10-CM | POA: Diagnosis not present

## 2013-12-04 DIAGNOSIS — Z87891 Personal history of nicotine dependence: Secondary | ICD-10-CM | POA: Diagnosis not present

## 2013-12-04 DIAGNOSIS — I6200 Nontraumatic subdural hemorrhage, unspecified: Secondary | ICD-10-CM | POA: Diagnosis not present

## 2013-12-04 DIAGNOSIS — R059 Cough, unspecified: Secondary | ICD-10-CM | POA: Diagnosis not present

## 2013-12-04 DIAGNOSIS — I158 Other secondary hypertension: Secondary | ICD-10-CM | POA: Diagnosis not present

## 2013-12-04 DIAGNOSIS — S065XAA Traumatic subdural hemorrhage with loss of consciousness status unknown, initial encounter: Secondary | ICD-10-CM | POA: Diagnosis present

## 2013-12-04 DIAGNOSIS — I2510 Atherosclerotic heart disease of native coronary artery without angina pectoris: Secondary | ICD-10-CM | POA: Diagnosis present

## 2013-12-04 DIAGNOSIS — Z86711 Personal history of pulmonary embolism: Secondary | ICD-10-CM

## 2013-12-04 DIAGNOSIS — I428 Other cardiomyopathies: Secondary | ICD-10-CM | POA: Diagnosis not present

## 2013-12-04 DIAGNOSIS — S0083XA Contusion of other part of head, initial encounter: Secondary | ICD-10-CM | POA: Diagnosis not present

## 2013-12-04 DIAGNOSIS — W19XXXA Unspecified fall, initial encounter: Secondary | ICD-10-CM | POA: Diagnosis not present

## 2013-12-04 DIAGNOSIS — I129 Hypertensive chronic kidney disease with stage 1 through stage 4 chronic kidney disease, or unspecified chronic kidney disease: Secondary | ICD-10-CM | POA: Diagnosis present

## 2013-12-04 DIAGNOSIS — Z89612 Acquired absence of left leg above knee: Secondary | ICD-10-CM | POA: Diagnosis not present

## 2013-12-04 DIAGNOSIS — Z993 Dependence on wheelchair: Secondary | ICD-10-CM | POA: Diagnosis not present

## 2013-12-04 DIAGNOSIS — S1093XA Contusion of unspecified part of neck, initial encounter: Secondary | ICD-10-CM | POA: Diagnosis not present

## 2013-12-04 DIAGNOSIS — I2699 Other pulmonary embolism without acute cor pulmonale: Secondary | ICD-10-CM | POA: Diagnosis not present

## 2013-12-04 DIAGNOSIS — Z86718 Personal history of other venous thrombosis and embolism: Secondary | ICD-10-CM | POA: Diagnosis not present

## 2013-12-04 DIAGNOSIS — E876 Hypokalemia: Secondary | ICD-10-CM | POA: Diagnosis not present

## 2013-12-04 DIAGNOSIS — N19 Unspecified kidney failure: Secondary | ICD-10-CM | POA: Diagnosis not present

## 2013-12-04 DIAGNOSIS — R008 Other abnormalities of heart beat: Secondary | ICD-10-CM | POA: Diagnosis not present

## 2013-12-04 DIAGNOSIS — I6203 Nontraumatic chronic subdural hemorrhage: Secondary | ICD-10-CM | POA: Diagnosis present

## 2013-12-04 DIAGNOSIS — Z9181 History of falling: Secondary | ICD-10-CM

## 2013-12-04 DIAGNOSIS — S065X0D Traumatic subdural hemorrhage without loss of consciousness, subsequent encounter: Secondary | ICD-10-CM | POA: Diagnosis not present

## 2013-12-04 DIAGNOSIS — Z79899 Other long term (current) drug therapy: Secondary | ICD-10-CM

## 2013-12-04 DIAGNOSIS — R05 Cough: Secondary | ICD-10-CM | POA: Diagnosis not present

## 2013-12-04 DIAGNOSIS — R29898 Other symptoms and signs involving the musculoskeletal system: Secondary | ICD-10-CM | POA: Diagnosis not present

## 2013-12-04 DIAGNOSIS — I82A19 Acute embolism and thrombosis of unspecified axillary vein: Secondary | ICD-10-CM | POA: Diagnosis not present

## 2013-12-04 DIAGNOSIS — Z7901 Long term (current) use of anticoagulants: Secondary | ICD-10-CM

## 2013-12-04 DIAGNOSIS — I499 Cardiac arrhythmia, unspecified: Secondary | ICD-10-CM

## 2013-12-04 DIAGNOSIS — S065X9A Traumatic subdural hemorrhage with loss of consciousness of unspecified duration, initial encounter: Secondary | ICD-10-CM | POA: Diagnosis present

## 2013-12-04 DIAGNOSIS — Z452 Encounter for adjustment and management of vascular access device: Secondary | ICD-10-CM | POA: Diagnosis not present

## 2013-12-04 DIAGNOSIS — S065X0A Traumatic subdural hemorrhage without loss of consciousness, initial encounter: Secondary | ICD-10-CM | POA: Diagnosis present

## 2013-12-04 DIAGNOSIS — N390 Urinary tract infection, site not specified: Secondary | ICD-10-CM | POA: Diagnosis not present

## 2013-12-04 DIAGNOSIS — N179 Acute kidney failure, unspecified: Secondary | ICD-10-CM | POA: Diagnosis not present

## 2013-12-04 DIAGNOSIS — N400 Enlarged prostate without lower urinary tract symptoms: Secondary | ICD-10-CM | POA: Diagnosis present

## 2013-12-04 DIAGNOSIS — I252 Old myocardial infarction: Secondary | ICD-10-CM

## 2013-12-04 DIAGNOSIS — S0003XA Contusion of scalp, initial encounter: Secondary | ICD-10-CM | POA: Diagnosis not present

## 2013-12-04 DIAGNOSIS — S065X5S Traumatic subdural hemorrhage with loss of consciousness greater than 24 hours with return to pre-existing conscious level, sequela: Secondary | ICD-10-CM | POA: Diagnosis not present

## 2013-12-04 DIAGNOSIS — I509 Heart failure, unspecified: Secondary | ICD-10-CM

## 2013-12-04 DIAGNOSIS — G9340 Encephalopathy, unspecified: Secondary | ICD-10-CM | POA: Diagnosis not present

## 2013-12-04 DIAGNOSIS — I5022 Chronic systolic (congestive) heart failure: Secondary | ICD-10-CM | POA: Diagnosis not present

## 2013-12-04 HISTORY — DX: Other pulmonary embolism without acute cor pulmonale: I26.99

## 2013-12-04 HISTORY — DX: Acute embolism and thrombosis of unspecified deep veins of unspecified lower extremity: I82.409

## 2013-12-04 LAB — CBC WITH DIFFERENTIAL/PLATELET
BASOS PCT: 0 % (ref 0–1)
Basophils Absolute: 0 10*3/uL (ref 0.0–0.1)
Eosinophils Absolute: 0.2 10*3/uL (ref 0.0–0.7)
Eosinophils Relative: 3 % (ref 0–5)
HCT: 48.8 % (ref 39.0–52.0)
Hemoglobin: 15.7 g/dL (ref 13.0–17.0)
Lymphocytes Relative: 31 % (ref 12–46)
Lymphs Abs: 1.8 10*3/uL (ref 0.7–4.0)
MCH: 27.4 pg (ref 26.0–34.0)
MCHC: 32.2 g/dL (ref 30.0–36.0)
MCV: 85.3 fL (ref 78.0–100.0)
Monocytes Absolute: 0.6 10*3/uL (ref 0.1–1.0)
Monocytes Relative: 10 % (ref 3–12)
NEUTROS PCT: 56 % (ref 43–77)
Neutro Abs: 3.3 10*3/uL (ref 1.7–7.7)
PLATELETS: 168 10*3/uL (ref 150–400)
RBC: 5.72 MIL/uL (ref 4.22–5.81)
RDW: 17 % — ABNORMAL HIGH (ref 11.5–15.5)
WBC: 5.9 10*3/uL (ref 4.0–10.5)

## 2013-12-04 LAB — I-STAT CHEM 8, ED
BUN: 27 mg/dL — ABNORMAL HIGH (ref 6–23)
Calcium, Ion: 1.16 mmol/L (ref 1.13–1.30)
Chloride: 107 mEq/L (ref 96–112)
Creatinine, Ser: 1.5 mg/dL — ABNORMAL HIGH (ref 0.50–1.35)
GLUCOSE: 84 mg/dL (ref 70–99)
HEMATOCRIT: 52 % (ref 39.0–52.0)
HEMOGLOBIN: 17.7 g/dL — AB (ref 13.0–17.0)
POTASSIUM: 3.5 meq/L — AB (ref 3.7–5.3)
SODIUM: 142 meq/L (ref 137–147)
TCO2: 27 mmol/L (ref 0–100)

## 2013-12-04 LAB — CBG MONITORING, ED: GLUCOSE-CAPILLARY: 93 mg/dL (ref 70–99)

## 2013-12-04 MED ORDER — ACETAMINOPHEN 325 MG PO TABS: 650.0000 mg | ORAL_TABLET | Freq: Four times a day (QID) | ORAL | Status: DC | PRN

## 2013-12-04 MED ORDER — FUROSEMIDE 40 MG PO TABS
40.0000 mg | ORAL_TABLET | Freq: Every day | ORAL | Status: DC
  Filled 2013-12-04: qty 1

## 2013-12-04 MED ORDER — CARVEDILOL 6.25 MG PO TABS
6.2500 mg | ORAL_TABLET | Freq: Two times a day (BID) | ORAL | Status: DC
  Administered 2013-12-05 – 2013-12-13 (×15): 6.25 mg via ORAL
  Filled 2013-12-04 (×20): qty 1

## 2013-12-04 MED ORDER — TAMSULOSIN HCL 0.4 MG PO CAPS
0.4000 mg | ORAL_CAPSULE | Freq: Every day | ORAL | Status: DC
  Administered 2013-12-05 – 2013-12-13 (×9): 0.4 mg via ORAL
  Filled 2013-12-04 (×9): qty 1

## 2013-12-04 MED ORDER — KCL IN DEXTROSE-NACL 20-5-0.45 MEQ/L-%-% IV SOLN
INTRAVENOUS | Status: DC
  Administered 2013-12-04: 50 mL/h via INTRAVENOUS
  Filled 2013-12-04: qty 1000

## 2013-12-04 MED ORDER — ACETAMINOPHEN 650 MG RE SUPP: 650.0000 mg | Freq: Four times a day (QID) | RECTAL | Status: DC | PRN

## 2013-12-04 MED ORDER — HYDROCODONE-ACETAMINOPHEN 5-325 MG PO TABS: 1.0000 | ORAL_TABLET | ORAL | Status: DC | PRN

## 2013-12-04 NOTE — ED Notes (Signed)
Pt unable to void at this time. 

## 2013-12-04 NOTE — H&P (Signed)
Kevin [name] is an 73 y.o. male.   Chief Complaint: Subdural hematoma HPI: The patient is a 73 year old gentleman with multiple medical problems who takes xeralto on a regular basis. Recently, he has suffered some falls. He [place] in the [place] area and when his son came to see him in the last few days he felt that his father had changed somewhat neurologically. He therefore brought him back to [facility] and brought [facility] for evaluation. A CT scan of his head was obtained which showed a large isodense subdural on the left side with 2 cm of midline shift. Neurosurgical consultation was requested.  Past Medical History  Diagnosis Date  . Arthritis   . Hypertension   . CHF (congestive heart failure)     a. NICM/acute CHF 2007 with EF 10-20%. b. Improved EF 50-55% by echo 12/2011.  Marland Kitchen Hyperlipidemia   . Coronary artery disease     a. NSTEMI 2007 with cath showing mild nonobstructive CAD by cath 2007 (40% mLAD, 40%mRCA) at time of dx of NICM.  Marland Kitchen Tortuous aorta     a. Noted by cath 2007, recommendation for OP [place].  Marland Kitchen BPH (benign prostatic hyperplasia)   . Wide-complex tachycardia     a. Noted 2007.  . Obesity   . Valvular heart disease     a. Mod MR by echo 06/2005 but no significant MR 2013.    Past Surgical History  Procedure Laterality Date  . No past surgeries    . Vena cava filter placement  01/03/2012    Procedure: INSERTION VENA-CAVA FILTER;  Surgeon: Rosetta Posner, MD;  Location: [facility];  Service: Vascular;  Laterality: Left;  . Amputation  01/05/2012    Procedure: AMPUTATION ABOVE KNEE;  Surgeon: Rosetta Posner, MD;  Location: [facility] OR;  Service: Vascular;  Laterality: Left;    Family History  Problem Relation Age of Onset  . Heart attack Mother     In her 57's  . Stroke Mother     In her 77's  . CAD Brother    Social History:  reports that he has quit smoking. He uses smokeless tobacco. He reports that he does not drink alcohol or use illicit  drugs.  Allergies: No Known Allergies   (Not in a hospital admission)  Results for orders placed during the hospital encounter of 12/04/13 (from the past 48 hour(s))  CBC WITH DIFFERENTIAL     Status: Abnormal   Collection Time    12/04/13  8:59 PM      Result Value Ref Range   WBC 5.9  4.0 - 10.5 K/uL   RBC 5.72  4.22 - 5.81 MIL/uL   Hemoglobin 15.7  13.0 - 17.0 g/dL   HCT 48.8  39.0 - 52.0 %   MCV 85.3  78.0 - 100.0 fL   MCH 27.4  26.0 - 34.0 pg   MCHC 32.2  30.0 - 36.0 g/dL   RDW 17.0 (*) 11.5 - 15.5 %   Platelets 168  150 - 400 K/uL   Neutrophils Relative % 56  43 - 77 %   Neutro Abs 3.3  1.7 - 7.7 K/uL   Lymphocytes Relative 31  12 - 46 %   Lymphs Abs 1.8  0.7 - 4.0 K/uL   Monocytes Relative 10  3 - 12 %   Monocytes Absolute 0.6  0.1 - 1.0 K/uL   Eosinophils Relative 3  0 - 5 %   Eosinophils Absolute  0.2  0.0 - 0.7 K/uL   Basophils Relative 0  0 - 1 %   Basophils Absolute 0.0  0.0 - 0.1 K/uL  I-STAT CHEM 8, ED     Status: Abnormal   Collection Time    12/04/13  9:20 PM      Result Value Ref Range   Sodium 142  137 - 147 mEq/L   Potassium 3.5 (*) 3.7 - 5.3 mEq/L   Chloride 107  96 - 112 mEq/L   BUN 27 (*) 6 - 23 mg/dL   Creatinine, Ser 1.50 (*) 0.50 - 1.35 mg/dL   Glucose, Bld 84  70 - 99 mg/dL   Calcium, Ion 1.16  1.13 - 1.30 mmol/L   TCO2 27  0 - 100 mmol/L   Hemoglobin 17.7 (*) 13.0 - 17.0 g/dL   HCT 52.0  39.0 - 52.0 %  CBG MONITORING, ED     Status: None   Collection Time    12/04/13  9:23 PM      Result Value Ref Range   Glucose-Capillary 93  70 - 99 mg/dL   Comment 1 Notify RN     Ct Head (brain) Wo Contrast  12/04/2013   CLINICAL DATA:  Right leg weakness  EXAM: CT HEAD WITHOUT CONTRAST  TECHNIQUE: Contiguous axial images were obtained from the base of the skull through the vertex without intravenous contrast.  COMPARISON:  11/27/2011  FINDINGS: Bony calvarium is intact. Multiple foreign bodies are noted within the posterior scalp near the skull base  but stable from the prior exam. A large left-sided subdural hematoma is identified which measures 23 mm in thickness in the left frontal region and 22 mm in thickness in the left posterior parietal region. Significant mass-effect upon the left cerebral hemisphere is noted with midline shift of approximately 17 mm. Atrophic changes are seen. Blunting of the sulcal markings is noted on the left related to the increased intracranial pressure. No acute infarct is identified  IMPRESSION: Large left subdural hematoma as described above. There is considerable mass-effect upon the left cerebral hemisphere with midline shift as described.  Metallic and apparent glass foreign bodies within the soft tissues of the posterior scalp near the skullbase.  These results were called by telephone at the time of interpretation on 12/04/2013 at 9:20 pm to Dr. Stark Jock , who verbally acknowledged these results.   Electronically Signed   By: Inez Catalina M.D.   On: 12/04/2013 21:26    Pertinent items are noted in HPI.  Blood pressure 144/96, pulse 68, temperature 97.8 F (36.6 C), temperature source Oral, resp. rate 18, height 5\' 7"  (1.702 m), weight 72.576 kg (160 lb), SpO2 96.00%.  The patient is awake alert and conversant. He follows complex commands with good strength bilaterally. He does have occasional word finding issues. His gait is not tested. He does have a well-healed amputation of the left lower extremity. His cranial nerve function is normal. Assessment/Plan The CT scan is reviewed. As above, it shows a large isodense subdural was significant midline shift. At this time however patient has been on some which unfortunately is not true reversible. If he was in neurologic extremus we may have been forced to proceed with surgery anyway. Because however he is doing so well neurologically, 1 to carefully watch him in the intensive care setting and then operate tomorrow or the next day once his coagulation has normalized  naturally. If he were to deteriorate, than we would most likely proceed with surgery  but for now he is doing so well it seems the safer course to follow is to wait for things to normalize. I am off tomorrow and Dr.Cabbell is on call. I have discussed the case with him and Mr. Trilby Leaver her were to deteriorate tomorrow he will handle any surgery that as needed during my absence.  Faythe Ghee, MD 12/04/2013, 10:37 PM

## 2013-12-04 NOTE — Consult Note (Signed)
PULMONARY / CRITICAL CARE MEDICINE   Name: Kevin [name] MRN: [id number] DOB: 28-Nov-1940    ADMISSION DATE:  12/04/2013 CONSULTATION DATE:  12/04/2013  REFERRING MD :  Hal Neer  CHIEF COMPLAINT:  AMS  INITIAL PRESENTATION: 73 year old male presented to [facility] with AMS x 2-3 days. He has recently had falls as and takes xarelto. Head CT showed subdural hematoma on Left with resultant midline shift of 2cm. He was admitted by neurosurgery.  PCCM asked to assist with medical management.   STUDIES:  9/22 CT Head: Large left subdural hematoma with considerable mass-effect   SIGNIFICANT EVENTS: 9/22: Admitted for SDH.   HISTORY OF PRESENT ILLNESS:  73 year old male with history as described below, which includes CHF and CAD, presented to [facility] 9/22 with AMS. He lives at home and has had several falls recently. He also takes xarelto. Son felt as though he had been acting different recently and brought him to [facility] ED for eval. Head CT showed L subdural hematoma with isodense appearance. Midline shift of 2cm. He is admitted to neurosurgery with intentions of managing this medically. PCCM to assist with medical management.   PAST MEDICAL HISTORY :  Past Medical History  Diagnosis Date  . Arthritis   . Hypertension   . CHF (congestive heart failure)     a. NICM/acute CHF 2007 with EF 10-20%. b. Improved EF 50-55% by echo 12/2011.  Marland Kitchen Hyperlipidemia   . Coronary artery disease     a. NSTEMI 2007 with cath showing mild nonobstructive CAD by cath 2007 (40% mLAD, 40%mRCA) at time of dx of NICM.  Marland Kitchen Tortuous aorta     a. Noted by cath 2007, recommendation for OP [place].  Marland Kitchen BPH (benign prostatic hyperplasia)   . Wide-complex tachycardia     a. Noted 2007.  . Obesity   . Valvular heart disease     a. Mod MR by echo 06/2005 but no significant MR 2013.   Past Surgical History  Procedure Laterality Date  . No past surgeries    . Vena cava filter placement  01/03/2012    Procedure: INSERTION VENA-CAVA FILTER;   Surgeon: Rosetta Posner, MD;  Location: [place];  Service: Vascular;  Laterality: Left;  . Amputation  01/05/2012    Procedure: AMPUTATION ABOVE KNEE;  Surgeon: Rosetta Posner, MD;  Location: [place];  Service: Vascular;  Laterality: Left;   Prior to Admission medications   Medication Sig Start Date End Date Taking? Authorizing Provider  atorvastatin (LIPITOR) 20 MG tablet Take 20 mg by mouth daily.   Yes Historical Provider, MD  carvedilol (COREG) 6.25 MG tablet Take 6.25 mg by mouth 2 (two) times daily with a meal.   Yes Historical Provider, MD  diazepam (VALIUM) 5 MG tablet Take 5 mg by mouth every 6 (six) hours as needed for anxiety.  06/20/12  Yes Historical Provider, MD  furosemide (LASIX) 40 MG tablet Take 40 mg by mouth daily.  04/21/12  Yes Historical Provider, MD  tamsulosin (FLOMAX) 0.4 MG CAPS Take 0.4 mg by mouth daily.  06/21/12  Yes Historical Provider, MD  XARELTO 20 MG TABS Take 20 mg by mouth daily.  02/23/12  Yes Historical Provider, MD   No Known Allergies  FAMILY HISTORY:  Family History  Problem Relation Age of Onset  . Heart attack Mother     In her 65's  . Stroke Mother     In her 27's  . CAD Brother  SOCIAL HISTORY:  reports that he has quit smoking. He uses smokeless tobacco. He reports that he does not drink alcohol or use illicit drugs.  REVIEW OF SYSTEMS:   Bolds are positive  Constitutional: weight loss, gain, night sweats, Fevers, chills, fatigue .  HEENT: headaches, Sore throat, sneezing, nasal congestion, post nasal drip, Difficulty swallowing, Tooth/dental problems, visual complaints visual changes, ear ache CV:  chest pain, radiates: ,Orthopnea, PND, swelling in lower extremities, dizziness, palpitations, syncope.  GI  heartburn, indigestion, abdominal pain, nausea, vomiting, diarrhea, change in bowel habits, loss of appetite, bloody stools.  Resp: cough, productive: , hemoptysis, dyspnea, chest pain, pleuritic.  Skin: rash or itching or icterus GU: dysuria,  change in color of urine, urgency or frequency. flank pain, hematuria  MS: joint pain or swelling. decreased range of motion  Psych: change in mood or affect. depression or anxiety.  Neuro: difficulty with speech, weakness, numbness, ataxia    SUBJECTIVE:   VITAL SIGNS: Temp:  [97.8 F (36.6 C)] 97.8 F (36.6 C) (09/22 1952) Pulse Rate:  [68] 68 (09/22 1952) Resp:  [18] 18 (09/22 1952) BP: (144)/(96) 144/96 mmHg (09/22 1952) SpO2:  [96 %] 96 % (09/22 1952) Weight:  [72.576 kg (160 lb)] 72.576 kg (160 lb) (09/22 1952) HEMODYNAMICS:   VENTILATOR SETTINGS:   INTAKE / OUTPUT: No intake or output data in the 24 hours ending 12/04/13 2319  PHYSICAL EXAMINATION: General:  Overweight male in NAD Neuro:  Alert, oriented, moderately confused, oriented to name & birthday only HEENT:  [place]/AT, PERRL, No JVD noted Cardiovascular:  RRR Lungs:  Clear anterior lung sounds Abdomen:  Soft, non-tender, non-distended Musculoskeletal:  Remote LLE AKA. Otherwise without deformity.  Skin:  Intact  LABS:  CBC  Recent Labs Lab 12/04/13 2059 12/04/13 2120  WBC 5.9  --   HGB 15.7 17.7*  HCT 48.8 52.0  PLT 168  --    Coag's No results found for this basename: APTT, INR,  in the last 168 hours BMET  Recent Labs Lab 12/04/13 2120  NA 142  K 3.5*  CL 107  BUN 27*  CREATININE 1.50*  GLUCOSE 84   Electrolytes No results found for this basename: CALCIUM, MG, PHOS,  in the last 168 hours Sepsis Markers No results found for this basename: LATICACIDVEN, PROCALCITON, O2SATVEN,  in the last 168 hours ABG No results found for this basename: PHART, PCO2ART, PO2ART,  in the last 168 hours Liver Enzymes No results found for this basename: AST, ALT, ALKPHOS, BILITOT, ALBUMIN,  in the last 168 hours Cardiac Enzymes No results found for this basename: TROPONINI, PROBNP,  in the last 168 hours Glucose  Recent Labs Lab 12/04/13 2123  GLUCAP 93    Imaging No results  found.   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  RASS goal: 0 Monitor   I have personally obtained a history, examined the patient, evaluated laboratory and imaging results, formulated the assessment and plan and placed orders.   Kara Mead MD. [facility]. [facility] Pager [phone] If no response call [phone]

## 2013-12-04 NOTE — ED Provider Notes (Signed)
CSN: [id number]     Arrival date & time 12/04/13  1940 History   First MD Initiated Contact with Patient 12/04/13 2024     Chief Complaint  Patient presents with  . 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  Has been having multiple falls.  Family reports that he was away with other family members until Sunday when they noticed a change in his overall behavior, slightly slurred speech, generalized weakness and trouble transferring from bed to wheelchair, also states he has had slight headache for the past 2-3 weeks   The history is provided by the patient and a relative.    Past Medical History  Diagnosis Date  . Arthritis   . Hypertension   . CHF (congestive heart failure)     a. NICM/acute CHF 2007 with EF 10-20%. b. Improved EF 50-55% by echo 12/2011.  Marland Kitchen Hyperlipidemia   . Coronary artery disease     a. NSTEMI 2007 with cath showing mild nonobstructive CAD by cath 2007 (40% mLAD, 40%mRCA) at time of dx of NICM.  Marland Kitchen Tortuous aorta     a. Noted by cath 2007, recommendation for OP [place].  Marland Kitchen BPH (benign prostatic hyperplasia)   . Wide-complex tachycardia     a. Noted 2007.  . Obesity   . Valvular heart disease     a. Mod MR by echo 06/2005 but no significant MR 2013.   Past Surgical History  Procedure Laterality Date  . No past surgeries    . Vena cava filter placement  01/03/2012    Procedure: INSERTION VENA-CAVA FILTER;  Surgeon: Rosetta Posner, MD;  Location: [place];  Service: Vascular;  Laterality: Left;  . Amputation  01/05/2012    Procedure: AMPUTATION ABOVE KNEE;  Surgeon: Rosetta Posner, MD;  Location: [facility] OR;  Service: Vascular;  Laterality: Left;   Family History  Problem Relation Age of Onset  . Heart attack Mother     In her 25's  . Stroke Mother     In  her 44's  . CAD Brother    History  Substance Use Topics  . Smoking status: Former Research scientist (life sciences)  . Smokeless tobacco: Current User  . Alcohol Use: No    Review of Systems  Constitutional: Negative for fever.  HENT: Negative for congestion.   Respiratory: Negative for shortness of breath.   Neurological: Positive for speech difficulty, weakness and headaches.  All other systems reviewed and are negative.     Allergies  Review of patient's allergies indicates no known allergies.  Home Medications   Prior to Admission medications   Medication Sig Start Date End Date Taking? Authorizing Provider  atorvastatin (LIPITOR) 20 MG tablet Take 20 mg by mouth daily.   Yes Historical Provider, MD  carvedilol (COREG) 6.25 MG tablet Take 6.25 mg by mouth 2 (two) times daily with a meal.   Yes Historical Provider, MD  diazepam (VALIUM) 5 MG tablet Take 5 mg by mouth every 6 (six) hours as needed for anxiety.  06/20/12  Yes Historical Provider, MD  furosemide (LASIX) 40 MG tablet Take 40 mg by mouth daily.  04/21/12  Yes Historical Provider, MD  tamsulosin (FLOMAX) 0.4 MG CAPS Take 0.4 mg by mouth daily.  06/21/12  Yes Historical Provider, MD  XARELTO 20 MG TABS Take 20 mg by mouth daily.  02/23/12  Yes Historical Provider, MD   BP 144/96  Pulse 68  Temp(Src) 97.8 F (36.6 C) (Oral)  Resp 18  Ht 5\' 7"  (1.702 m)  Wt 160 lb (72.576 kg)  BMI 25.05 kg/m2  SpO2 96% Physical Exam  Vitals reviewed. Constitutional: He is oriented to person, place, and time. He appears well-developed and well-nourished.  HENT:  Head: Normocephalic.  Right Ear: External ear normal.  Left Ear: External ear normal.  Mouth/Throat: Oropharynx is clear and moist.  Eyes: Pupils are equal, round, and reactive to light.  Neck: Normal range of motion.  Cardiovascular: Normal rate and regular rhythm.   Pulmonary/Chest: Effort normal.  Abdominal: Soft.  Musculoskeletal: Normal range of motion.  Left AKA  Neurological: He is  alert and oriented to person, place, and time. No cranial nerve deficit.  Skin: Skin is warm.    ED Course  Procedures (including critical care time) Labs Review Labs Reviewed  CBC WITH DIFFERENTIAL - Abnormal; Notable for the following:    RDW 17.0 (*)    All other components within normal limits  I-STAT CHEM 8, ED - Abnormal; Notable for the following:    Potassium 3.5 (*)    BUN 27 (*)    Creatinine, Ser 1.50 (*)    Hemoglobin 17.7 (*)    All other components within normal limits  URINALYSIS, ROUTINE W REFLEX MICROSCOPIC  CBG MONITORING, ED    Imaging Review Ct Head (brain) Wo Contrast  12/04/2013   CLINICAL DATA:  Right leg weakness  EXAM: CT HEAD WITHOUT CONTRAST  TECHNIQUE: Contiguous axial images were obtained from the base of the skull through the vertex without intravenous contrast.  COMPARISON:  11/27/2011  FINDINGS: Bony calvarium is intact. Multiple foreign bodies are noted within the posterior scalp near the skull base but stable from the prior exam. A large left-sided subdural hematoma is identified which measures 23 mm in thickness in the left frontal region and 22 mm in thickness in the left posterior parietal region. Significant mass-effect upon the left cerebral hemisphere is noted with midline shift of approximately 17 mm. Atrophic changes are seen. Blunting of the sulcal markings is noted on the left related to the increased intracranial pressure. No acute infarct is identified  IMPRESSION: Large left subdural hematoma as described above. There is considerable mass-effect upon the left cerebral hemisphere with midline shift as described.  Metallic and apparent glass foreign bodies within the soft tissues of the posterior scalp near the skullbase.  These results were called by telephone at the time of interpretation on 12/04/2013 at 9:20 pm to Dr. Stark Jock , who verbally acknowledged these results.   Electronically Signed   By: Inez Catalina M.D.   On: 12/04/2013 21:26     EKG  Interpretation None    Spoke with pharmacy  No reversal available for Xeralto  Can use FFP as supportive cafe or in dire situation Sherryll Burger clears the systme in 24-48 hours   MDM  Seen by Dr. Hal Neer who will admit to neuro intensive care for pending evacuation in the next 1-2 days  Final diagnoses:  Subdural hematoma         Garald Balding, NP 12/04/13 2237

## 2013-12-04 NOTE — ED Notes (Signed)
Family reports they think pt may have had a stroke this weekend. sts he has not been same since Sunday. Pt unable to use R leg and reports generalized weakness. Also reports slight blurred vision and slow speech. Pt has been having several falls lately. Pt fell last week hitting head. Pt is on Xarelto.

## 2013-12-05 ENCOUNTER — Encounter (HOSPITAL_COMMUNITY): Payer: Medicare Other | Admitting: Certified Registered Nurse Anesthetist

## 2013-12-05 ENCOUNTER — Encounter (HOSPITAL_COMMUNITY)
Admission: EM | Disposition: A | Discharge status: Rehabilitation Facility | Payer: Self-pay | Source: Home / Self Care | Attending: Neurosurgery

## 2013-12-05 ENCOUNTER — Inpatient Hospital Stay (HOSPITAL_COMMUNITY): Payer: Medicare Other

## 2013-12-05 ENCOUNTER — Inpatient Hospital Stay (HOSPITAL_COMMUNITY): Payer: Medicare Other | Admitting: Certified Registered Nurse Anesthetist

## 2013-12-05 ENCOUNTER — Encounter (HOSPITAL_COMMUNITY): Payer: Self-pay | Admitting: Certified Registered Nurse Anesthetist

## 2013-12-05 DIAGNOSIS — I9589 Other hypotension: Secondary | ICD-10-CM

## 2013-12-05 DIAGNOSIS — S065X9A Traumatic subdural hemorrhage with loss of consciousness of unspecified duration, initial encounter: Secondary | ICD-10-CM | POA: Diagnosis present

## 2013-12-05 DIAGNOSIS — S065XAA Traumatic subdural hemorrhage with loss of consciousness status unknown, initial encounter: Secondary | ICD-10-CM | POA: Diagnosis present

## 2013-12-05 DIAGNOSIS — I6203 Nontraumatic chronic subdural hemorrhage: Secondary | ICD-10-CM | POA: Diagnosis present

## 2013-12-05 DIAGNOSIS — I509 Heart failure, unspecified: Secondary | ICD-10-CM

## 2013-12-05 HISTORY — PX: CRANIOTOMY: SHX93

## 2013-12-05 LAB — BASIC METABOLIC PANEL
ANION GAP: 12 (ref 5–15)
BUN: 25 mg/dL — ABNORMAL HIGH (ref 6–23)
CALCIUM: 9 mg/dL (ref 8.4–10.5)
CO2: 25 mEq/L (ref 19–32)
Chloride: 106 mEq/L (ref 96–112)
Creatinine, Ser: 1.36 mg/dL — ABNORMAL HIGH (ref 0.50–1.35)
GFR calc Af Amer: 58 mL/min — ABNORMAL LOW (ref >90)
GFR calc non Af Amer: 50 mL/min — ABNORMAL LOW (ref >90)
GLUCOSE: 103 mg/dL — AB (ref 70–99)
Potassium: 4.1 mEq/L (ref 3.7–5.3)
SODIUM: 143 meq/L (ref 137–147)

## 2013-12-05 LAB — TYPE AND SCREEN
ABO/RH(D): AB NEG
ANTIBODY SCREEN: NEGATIVE

## 2013-12-05 LAB — CBC
HCT: 46.8 % (ref 39.0–52.0)
Hemoglobin: 14.6 g/dL (ref 13.0–17.0)
MCH: 25.9 pg — ABNORMAL LOW (ref 26.0–34.0)
MCHC: 31.2 g/dL (ref 30.0–36.0)
MCV: 83.1 fL (ref 78.0–100.0)
Platelets: 158 10*3/uL (ref 150–400)
RBC: 5.63 MIL/uL (ref 4.22–5.81)
RDW: 16.9 % — ABNORMAL HIGH (ref 11.5–15.5)
WBC: 5.5 10*3/uL (ref 4.0–10.5)

## 2013-12-05 LAB — PROTIME-INR
INR: 1 (ref 0.00–1.49)
PROTHROMBIN TIME: 13.2 s (ref 11.6–15.2)

## 2013-12-05 LAB — MRSA PCR SCREENING: MRSA BY PCR: NEGATIVE

## 2013-12-05 SURGERY — CRANIOTOMY HEMATOMA EVACUATION SUBDURAL
Anesthesia: General | Laterality: Left

## 2013-12-05 SURGERY — CRANIOTOMY HEMATOMA EVACUATION SUBDURAL
Anesthesia: General

## 2013-12-05 MED ORDER — ONDANSETRON HCL 4 MG/2ML IJ SOLN
INTRAMUSCULAR | Status: AC
  Filled 2013-12-05: qty 2

## 2013-12-05 MED ORDER — LACTATED RINGERS IV SOLN
INTRAVENOUS | Status: DC | PRN
  Administered 2013-12-05: 19:00:00 via INTRAVENOUS

## 2013-12-05 MED ORDER — THROMBIN 20000 UNITS EX SOLR
CUTANEOUS | Status: DC | PRN
  Administered 2013-12-05: 19:00:00 via TOPICAL

## 2013-12-05 MED ORDER — PROMETHAZINE HCL 25 MG PO TABS: 12.5000 mg | ORAL_TABLET | ORAL | Status: DC | PRN

## 2013-12-05 MED ORDER — ONDANSETRON HCL 4 MG PO TABS: 4.0000 mg | ORAL_TABLET | ORAL | Status: DC | PRN

## 2013-12-05 MED ORDER — CEFAZOLIN SODIUM-DEXTROSE 2-3 GM-% IV SOLR
INTRAVENOUS | Status: AC
  Administered 2013-12-05: 2 g via INTRAVENOUS
  Filled 2013-12-05: qty 50

## 2013-12-05 MED ORDER — SODIUM CHLORIDE 0.9 % IV SOLN
10.0000 mg | INTRAVENOUS | Status: DC | PRN
  Administered 2013-12-05: 25 ug/min via INTRAVENOUS

## 2013-12-05 MED ORDER — MAGNESIUM CITRATE PO SOLN
1.0000 | Freq: Once | ORAL | Status: AC | PRN
  Filled 2013-12-05: qty 296

## 2013-12-05 MED ORDER — NEOSTIGMINE METHYLSULFATE 10 MG/10ML IV SOLN
INTRAVENOUS | Status: DC | PRN
  Administered 2013-12-05: 4 mg via INTRAVENOUS

## 2013-12-05 MED ORDER — SODIUM CHLORIDE 0.9 % IV SOLN
INTRAVENOUS | Status: DC
  Administered 2013-12-05 – 2013-12-07 (×4): via INTRAVENOUS

## 2013-12-05 MED ORDER — SENNA 8.6 MG PO TABS
1.0000 | ORAL_TABLET | Freq: Two times a day (BID) | ORAL | Status: DC
  Administered 2013-12-06 – 2013-12-13 (×13): 8.6 mg via ORAL
  Filled 2013-12-05 (×17): qty 1

## 2013-12-05 MED ORDER — FENTANYL CITRATE 0.05 MG/ML IJ SOLN
INTRAMUSCULAR | Status: DC | PRN
  Administered 2013-12-05: 50 ug via INTRAVENOUS
  Administered 2013-12-05 (×2): 100 ug via INTRAVENOUS

## 2013-12-05 MED ORDER — ONDANSETRON HCL 4 MG/2ML IJ SOLN: 4.0000 mg | Freq: Once | INTRAMUSCULAR | Status: DC | PRN

## 2013-12-05 MED ORDER — NALOXONE HCL 0.4 MG/ML IJ SOLN
0.0800 mg | INTRAMUSCULAR | Status: DC | PRN
Start: 2013-12-05 — End: 2013-12-13

## 2013-12-05 MED ORDER — HYDROMORPHONE HCL 1 MG/ML IJ SOLN: 0.2500 mg | INTRAMUSCULAR | Status: DC | PRN

## 2013-12-05 MED ORDER — MORPHINE SULFATE 2 MG/ML IJ SOLN
1.0000 mg | INTRAMUSCULAR | Status: DC | PRN
  Administered 2013-12-06 (×2): 1 mg via INTRAVENOUS
  Administered 2013-12-08 – 2013-12-09 (×6): 2 mg via INTRAVENOUS
  Administered 2013-12-12: 1 mg via INTRAVENOUS
  Filled 2013-12-05 (×9): qty 1

## 2013-12-05 MED ORDER — PANTOPRAZOLE SODIUM 40 MG IV SOLR
40.0000 mg | Freq: Every day | INTRAVENOUS | Status: DC
  Administered 2013-12-05 – 2013-12-06 (×2): 40 mg via INTRAVENOUS
  Filled 2013-12-05 (×3): qty 40

## 2013-12-05 MED ORDER — BACITRACIN ZINC 500 UNIT/GM EX OINT
TOPICAL_OINTMENT | CUTANEOUS | Status: DC | PRN
  Administered 2013-12-05: 1 via TOPICAL

## 2013-12-05 MED ORDER — SODIUM CHLORIDE 0.9 % IJ SOLN
INTRAMUSCULAR | Status: AC
  Filled 2013-12-05: qty 10

## 2013-12-05 MED ORDER — PROPOFOL 10 MG/ML IV BOLUS
INTRAVENOUS | Status: DC | PRN
  Administered 2013-12-05: 140 mg via INTRAVENOUS
  Administered 2013-12-05: 60 mg via INTRAVENOUS

## 2013-12-05 MED ORDER — CETYLPYRIDINIUM CHLORIDE 0.05 % MT LIQD
7.0000 mL | Freq: Two times a day (BID) | OROMUCOSAL | Status: DC
  Administered 2013-12-05 – 2013-12-06 (×2): 7 mL via OROMUCOSAL

## 2013-12-05 MED ORDER — PROPOFOL 10 MG/ML IV BOLUS
INTRAVENOUS | Status: AC
  Filled 2013-12-05: qty 20

## 2013-12-05 MED ORDER — LIDOCAINE HCL (CARDIAC) 20 MG/ML IV SOLN
INTRAVENOUS | Status: AC
Start: 2013-12-05 — End: 2013-12-05
  Filled 2013-12-05: qty 5

## 2013-12-05 MED ORDER — LABETALOL HCL 5 MG/ML IV SOLN
10.0000 mg | INTRAVENOUS | Status: DC | PRN
  Administered 2013-12-05: 20 mg via INTRAVENOUS
  Filled 2013-12-05: qty 4

## 2013-12-05 MED ORDER — BISACODYL 10 MG RE SUPP: 10.0000 mg | Freq: Every day | RECTAL | Status: DC | PRN

## 2013-12-05 MED ORDER — 0.9 % SODIUM CHLORIDE (POUR BTL) OPTIME
TOPICAL | Status: DC | PRN
  Administered 2013-12-05 (×3): 1000 mL

## 2013-12-05 MED ORDER — FENTANYL CITRATE 0.05 MG/ML IJ SOLN
INTRAMUSCULAR | Status: AC
  Filled 2013-12-05: qty 5

## 2013-12-05 MED ORDER — ROCURONIUM BROMIDE 50 MG/5ML IV SOLN
INTRAVENOUS | Status: AC
  Filled 2013-12-05: qty 1

## 2013-12-05 MED ORDER — NEOSTIGMINE METHYLSULFATE 10 MG/10ML IV SOLN
INTRAVENOUS | Status: AC
  Filled 2013-12-05: qty 1

## 2013-12-05 MED ORDER — POTASSIUM CHLORIDE IN NACL 20-0.9 MEQ/L-% IV SOLN
INTRAVENOUS | Status: DC
  Administered 2013-12-05 – 2013-12-06 (×2): via INTRAVENOUS
  Filled 2013-12-05 (×3): qty 1000

## 2013-12-05 MED ORDER — POLYETHYLENE GLYCOL 3350 17 G PO PACK
17.0000 g | PACK | Freq: Every day | ORAL | Status: DC | PRN
  Filled 2013-12-05: qty 1

## 2013-12-05 MED ORDER — SUCCINYLCHOLINE CHLORIDE 20 MG/ML IJ SOLN
INTRAMUSCULAR | Status: AC
  Filled 2013-12-05: qty 1

## 2013-12-05 MED ORDER — CHLORHEXIDINE GLUCONATE 0.12 % MT SOLN
15.0000 mL | Freq: Two times a day (BID) | OROMUCOSAL | Status: DC
  Administered 2013-12-06: 15 mL via OROMUCOSAL
  Filled 2013-12-05: qty 15

## 2013-12-05 MED ORDER — SUCCINYLCHOLINE CHLORIDE 20 MG/ML IJ SOLN
INTRAMUSCULAR | Status: DC | PRN
  Administered 2013-12-05: 100 mg via INTRAVENOUS

## 2013-12-05 MED ORDER — HYDROCODONE-ACETAMINOPHEN 5-325 MG PO TABS
1.0000 | ORAL_TABLET | ORAL | Status: DC | PRN
  Administered 2013-12-06 – 2013-12-13 (×25): 1 via ORAL
  Filled 2013-12-05 (×25): qty 1

## 2013-12-05 MED ORDER — LIDOCAINE-EPINEPHRINE 0.5 %-1:200000 IJ SOLN
INTRAMUSCULAR | Status: DC | PRN
  Administered 2013-12-05: 20 mL

## 2013-12-05 MED ORDER — EPHEDRINE SULFATE 50 MG/ML IJ SOLN
INTRAMUSCULAR | Status: AC
  Filled 2013-12-05: qty 1

## 2013-12-05 MED ORDER — LIDOCAINE HCL (CARDIAC) 20 MG/ML IV SOLN
INTRAVENOUS | Status: AC
  Filled 2013-12-05: qty 5

## 2013-12-05 MED ORDER — MIDAZOLAM HCL 2 MG/2ML IJ SOLN
INTRAMUSCULAR | Status: AC
  Filled 2013-12-05: qty 2

## 2013-12-05 MED ORDER — ROCURONIUM BROMIDE 100 MG/10ML IV SOLN
INTRAVENOUS | Status: DC | PRN
  Administered 2013-12-05: 30 mg via INTRAVENOUS

## 2013-12-05 MED ORDER — ONDANSETRON HCL 4 MG/2ML IJ SOLN
4.0000 mg | INTRAMUSCULAR | Status: DC | PRN
  Administered 2013-12-06: 4 mg via INTRAVENOUS
  Filled 2013-12-05: qty 2

## 2013-12-05 MED ORDER — GLYCOPYRROLATE 0.2 MG/ML IJ SOLN
INTRAMUSCULAR | Status: DC | PRN
  Administered 2013-12-05: 0.6 mg via INTRAVENOUS

## 2013-12-05 MED ORDER — GLYCOPYRROLATE 0.2 MG/ML IJ SOLN
INTRAMUSCULAR | Status: AC
  Filled 2013-12-05: qty 3

## 2013-12-05 MED ORDER — ONDANSETRON HCL 4 MG/2ML IJ SOLN
INTRAMUSCULAR | Status: DC | PRN
  Administered 2013-12-05: 4 mg via INTRAVENOUS

## 2013-12-05 MED ORDER — LEVETIRACETAM IN NACL 500 MG/100ML IV SOLN
500.0000 mg | Freq: Two times a day (BID) | INTRAVENOUS | Status: DC
  Administered 2013-12-05 – 2013-12-06 (×3): 500 mg via INTRAVENOUS
  Filled 2013-12-05 (×5): qty 100

## 2013-12-05 SURGICAL SUPPLY — 76 items
APL SKNCLS STERI-STRIP NONHPOA (GAUZE/BANDAGES/DRESSINGS)
BENZOIN TINCTURE PRP APPL 2/3 (GAUZE/BANDAGES/DRESSINGS) IMPLANT
BLADE SURG ROTATE 9660 (MISCELLANEOUS) ×1 IMPLANT
BLADE ULTRA TIP 2M (BLADE) ×3 IMPLANT
BNDG GAUZE ELAST 4 BULKY (GAUZE/BANDAGES/DRESSINGS) IMPLANT
BRUSH SCRUB EZ 1% IODOPHOR (MISCELLANEOUS) ×1 IMPLANT
BUR ACORN 6.0 PRECISION (BURR) ×2 IMPLANT
BUR ACORN 6.0MM PRECISION (BURR) ×1
BUR ADDG 1.1 (BURR) IMPLANT
BUR ADDG 1.1MM (BURR)
BUR MATCHSTICK NEURO 3.0 LAGG (BURR) IMPLANT
BUR ROUTER D-58 CRANI (BURR) ×2 IMPLANT
CANISTER SUCT 3000ML (MISCELLANEOUS) ×3 IMPLANT
CLIP RANEY DISP (INSTRUMENTS) ×2 IMPLANT
CLIP TI MEDIUM 6 (CLIP) IMPLANT
CONT SPEC 4OZ CLIKSEAL STRL BL (MISCELLANEOUS) ×6 IMPLANT
CORDS BIPOLAR (ELECTRODE) ×1 IMPLANT
DRAIN SNY 10X20 3/4 PERF (WOUND CARE) ×4 IMPLANT
DRAIN SNY WOU 7FLT (WOUND CARE) IMPLANT
DRAPE NEUROLOGICAL W/INCISE (DRAPES) ×3 IMPLANT
DRAPE SURG 17X23 STRL (DRAPES) IMPLANT
DRAPE WARM FLUID 44X44 (DRAPE) ×3 IMPLANT
DRSG TELFA 3X8 NADH (GAUZE/BANDAGES/DRESSINGS) IMPLANT
DURAPREP 6ML APPLICATOR 50/CS (WOUND CARE) ×3 IMPLANT
ELECT CAUTERY BLADE 6.4 (BLADE) ×1 IMPLANT
ELECT REM PT RETURN 9FT ADLT (ELECTROSURGICAL) ×3
ELECTRODE REM PT RTRN 9FT ADLT (ELECTROSURGICAL) ×1 IMPLANT
EVACUATOR 1/8 PVC DRAIN (DRAIN) IMPLANT
EVACUATOR SILICONE 100CC (DRAIN) ×4 IMPLANT
GAUZE SPONGE 4X4 12PLY STRL (GAUZE/BANDAGES/DRESSINGS) ×3 IMPLANT
GAUZE SPONGE 4X4 16PLY XRAY LF (GAUZE/BANDAGES/DRESSINGS) IMPLANT
GLOVE BIOGEL M 8.0 STRL (GLOVE) ×2 IMPLANT
GLOVE BIOGEL PI IND STRL 7.5 (GLOVE) IMPLANT
GLOVE BIOGEL PI INDICATOR 7.5 (GLOVE) ×2
GLOVE ECLIPSE 6.5 STRL STRAW (GLOVE) ×3 IMPLANT
GLOVE ECLIPSE 7.5 STRL STRAW (GLOVE) ×4 IMPLANT
GLOVE EXAM NITRILE LRG STRL (GLOVE) IMPLANT
GLOVE EXAM NITRILE MD LF STRL (GLOVE) IMPLANT
GLOVE EXAM NITRILE XL STR (GLOVE) IMPLANT
GLOVE EXAM NITRILE XS STR PU (GLOVE) IMPLANT
GOWN STRL REUS W/ TWL LRG LVL3 (GOWN DISPOSABLE) ×2 IMPLANT
GOWN STRL REUS W/ TWL XL LVL3 (GOWN DISPOSABLE) IMPLANT
GOWN STRL REUS W/TWL 2XL LVL3 (GOWN DISPOSABLE) IMPLANT
GOWN STRL REUS W/TWL LRG LVL3 (GOWN DISPOSABLE) ×6
GOWN STRL REUS W/TWL XL LVL3 (GOWN DISPOSABLE) ×3
HEMOSTAT SURGICEL 2X14 (HEMOSTASIS) IMPLANT
KIT BASIN OR (CUSTOM PROCEDURE TRAY) ×3 IMPLANT
KIT ROOM TURNOVER OR (KITS) ×3 IMPLANT
NDL HYPO 25X1 1.5 SAFETY (NEEDLE) ×1 IMPLANT
NEEDLE HYPO 25X1 1.5 SAFETY (NEEDLE) ×3 IMPLANT
NS IRRIG 1000ML POUR BTL (IV SOLUTION) ×7 IMPLANT
PACK CRANIOTOMY (CUSTOM PROCEDURE TRAY) ×3 IMPLANT
PAD DRESSING TELFA 3X8 NADH (GAUZE/BANDAGES/DRESSINGS) ×1 IMPLANT
PATTIES SURGICAL .5 X.5 (GAUZE/BANDAGES/DRESSINGS) IMPLANT
PATTIES SURGICAL .5 X3 (DISPOSABLE) IMPLANT
PATTIES SURGICAL 1X1 (DISPOSABLE) IMPLANT
SPONGE NEURO XRAY DETECT 1X3 (DISPOSABLE) IMPLANT
SPONGE SURGIFOAM ABS GEL 100 (HEMOSTASIS) ×3 IMPLANT
STAPLER VISISTAT 35W (STAPLE) ×3 IMPLANT
SUT ETHILON 3 0 FSL (SUTURE) ×2 IMPLANT
SUT ETHILON 3 0 PS 1 (SUTURE) IMPLANT
SUT NURALON 4 0 TR CR/8 (SUTURE) ×7 IMPLANT
SUT PL GUT 3 0 FS 1 (SUTURE) IMPLANT
SUT STEEL 0 (SUTURE)
SUT STEEL 0 18XMFL TIE 17 (SUTURE) IMPLANT
SUT VIC AB 2-0 CT2 18 VCP726D (SUTURE) ×10 IMPLANT
SYR 20ML ECCENTRIC (SYRINGE) ×1 IMPLANT
SYR CONTROL 10ML LL (SYRINGE) ×1 IMPLANT
TAPE CLOTH SURG 4X10 WHT LF (GAUZE/BANDAGES/DRESSINGS) ×2 IMPLANT
TOWEL OR 17X24 6PK STRL BLUE (TOWEL DISPOSABLE) ×3 IMPLANT
TOWEL OR 17X26 10 PK STRL BLUE (TOWEL DISPOSABLE) ×3 IMPLANT
TRAY FOLEY CATH 14FRSI W/METER (CATHETERS) ×1 IMPLANT
TUBE CONNECTING 12'X1/4 (SUCTIONS) ×1
TUBE CONNECTING 12X1/4 (SUCTIONS) ×2 IMPLANT
UNDERPAD 30X30 INCONTINENT (UNDERPADS AND DIAPERS) ×1 IMPLANT
WATER STERILE IRR 1000ML POUR (IV SOLUTION) ×3 IMPLANT

## 2013-12-05 SURGICAL SUPPLY — 69 items
APL SKNCLS STERI-STRIP NONHPOA (GAUZE/BANDAGES/DRESSINGS)
BENZOIN TINCTURE PRP APPL 2/3 (GAUZE/BANDAGES/DRESSINGS) IMPLANT
BLADE SURG ROTATE 9660 (MISCELLANEOUS) ×4 IMPLANT
BLADE ULTRA TIP 2M (BLADE) ×4 IMPLANT
BNDG GAUZE ELAST 4 BULKY (GAUZE/BANDAGES/DRESSINGS) IMPLANT
BRUSH SCRUB EZ 1% IODOPHOR (MISCELLANEOUS) ×4 IMPLANT
BUR ACORN 6.0 PRECISION (BURR) ×3 IMPLANT
BUR ACORN 6.0MM PRECISION (BURR) ×1
BUR ADDG 1.1 (BURR) IMPLANT
BUR ADDG 1.1MM (BURR)
BUR MATCHSTICK NEURO 3.0 LAGG (BURR) IMPLANT
BUR ROUTER D-58 CRANI (BURR) IMPLANT
CANISTER SUCT 3000ML (MISCELLANEOUS) ×4 IMPLANT
CLIP TI MEDIUM 6 (CLIP) IMPLANT
CONT SPEC 4OZ CLIKSEAL STRL BL (MISCELLANEOUS) ×8 IMPLANT
CORDS BIPOLAR (ELECTRODE) ×4 IMPLANT
DRAIN SNY WOU 7FLT (WOUND CARE) IMPLANT
DRAPE NEUROLOGICAL W/INCISE (DRAPES) ×4 IMPLANT
DRAPE SURG 17X23 STRL (DRAPES) IMPLANT
DRAPE WARM FLUID 44X44 (DRAPE) ×4 IMPLANT
DRSG TELFA 3X8 NADH (GAUZE/BANDAGES/DRESSINGS) ×3 IMPLANT
DURAPREP 6ML APPLICATOR 50/CS (WOUND CARE) ×4 IMPLANT
ELECT CAUTERY BLADE 6.4 (BLADE) ×4 IMPLANT
ELECT REM PT RETURN 9FT ADLT (ELECTROSURGICAL) ×3
ELECTRODE REM PT RTRN 9FT ADLT (ELECTROSURGICAL) ×2 IMPLANT
EVACUATOR 1/8 PVC DRAIN (DRAIN) IMPLANT
EVACUATOR SILICONE 100CC (DRAIN) IMPLANT
GAUZE SPONGE 4X4 12PLY STRL (GAUZE/BANDAGES/DRESSINGS) ×4 IMPLANT
GAUZE SPONGE 4X4 16PLY XRAY LF (GAUZE/BANDAGES/DRESSINGS) IMPLANT
GLOVE ECLIPSE 6.5 STRL STRAW (GLOVE) ×4 IMPLANT
GLOVE EXAM NITRILE LRG STRL (GLOVE) IMPLANT
GLOVE EXAM NITRILE MD LF STRL (GLOVE) IMPLANT
GLOVE EXAM NITRILE XL STR (GLOVE) IMPLANT
GLOVE EXAM NITRILE XS STR PU (GLOVE) IMPLANT
GOWN STRL REUS W/ TWL LRG LVL3 (GOWN DISPOSABLE) ×4 IMPLANT
GOWN STRL REUS W/ TWL XL LVL3 (GOWN DISPOSABLE) IMPLANT
GOWN STRL REUS W/TWL 2XL LVL3 (GOWN DISPOSABLE) IMPLANT
GOWN STRL REUS W/TWL LRG LVL3 (GOWN DISPOSABLE) ×6
GOWN STRL REUS W/TWL XL LVL3 (GOWN DISPOSABLE)
HEMOSTAT SURGICEL 2X14 (HEMOSTASIS) IMPLANT
KIT BASIN OR (CUSTOM PROCEDURE TRAY) ×4 IMPLANT
KIT ROOM TURNOVER OR (KITS) ×4 IMPLANT
NDL HYPO 25X1 1.5 SAFETY (NEEDLE) ×1 IMPLANT
NEEDLE HYPO 25X1 1.5 SAFETY (NEEDLE) ×3 IMPLANT
NS IRRIG 1000ML POUR BTL (IV SOLUTION) ×4 IMPLANT
PACK CRANIOTOMY (CUSTOM PROCEDURE TRAY) ×4 IMPLANT
PAD DRESSING TELFA 3X8 NADH (GAUZE/BANDAGES/DRESSINGS) ×1 IMPLANT
PATTIES SURGICAL .5 X.5 (GAUZE/BANDAGES/DRESSINGS) IMPLANT
PATTIES SURGICAL .5 X3 (DISPOSABLE) IMPLANT
PATTIES SURGICAL 1X1 (DISPOSABLE) IMPLANT
SPONGE NEURO XRAY DETECT 1X3 (DISPOSABLE) IMPLANT
SPONGE SURGIFOAM ABS GEL 100 (HEMOSTASIS) ×4 IMPLANT
STAPLER VISISTAT 35W (STAPLE) ×4 IMPLANT
SUT ETHILON 3 0 FSL (SUTURE) IMPLANT
SUT ETHILON 3 0 PS 1 (SUTURE) IMPLANT
SUT NURALON 4 0 TR CR/8 (SUTURE) ×12 IMPLANT
SUT PL GUT 3 0 FS 1 (SUTURE) IMPLANT
SUT STEEL 0 (SUTURE)
SUT STEEL 0 18XMFL TIE 17 (SUTURE) IMPLANT
SUT VIC AB 2-0 CT2 18 VCP726D (SUTURE) ×8 IMPLANT
SYR 20ML ECCENTRIC (SYRINGE) ×4 IMPLANT
SYR CONTROL 10ML LL (SYRINGE) ×4 IMPLANT
TOWEL OR 17X24 6PK STRL BLUE (TOWEL DISPOSABLE) ×4 IMPLANT
TOWEL OR 17X26 10 PK STRL BLUE (TOWEL DISPOSABLE) ×4 IMPLANT
TRAY FOLEY CATH 14FRSI W/METER (CATHETERS) ×4 IMPLANT
TUBE CONNECTING 12'X1/4 (SUCTIONS) ×1
TUBE CONNECTING 12X1/4 (SUCTIONS) ×3 IMPLANT
UNDERPAD 30X30 INCONTINENT (UNDERPADS AND DIAPERS) ×4 IMPLANT
WATER STERILE IRR 1000ML POUR (IV SOLUTION) ×4 IMPLANT

## 2013-12-05 NOTE — Progress Notes (Signed)
Dr. Christella Noa at bedside.  Pt's systolic BP 295'F.  No new orders at this time.  Will continue to monitor and notify for further changes.

## 2013-12-05 NOTE — Progress Notes (Signed)
UR completed.  Lochlin Eppinger, RN BSN MHA CCM Trauma/Neuro ICU Case Manager [phone]  

## 2013-12-05 NOTE — Anesthesia Procedure Notes (Addendum)
Procedure Name: Intubation Date/Time: 12/05/2013 6:46 PM Performed by: Melina Copa, Shadi Sessler R Pre-anesthesia Checklist: Patient identified, Emergency Drugs available, Suction available, Patient being monitored and Timeout performed Patient Re-evaluated:Patient Re-evaluated prior to inductionOxygen Delivery Method: Circle system utilized Preoxygenation: Pre-oxygenation with 100% oxygen Intubation Type: IV induction and Rapid sequence Ventilation: Mask ventilation without difficulty Laryngoscope Size: Mac and 3 Grade View: Grade I Tube type: Oral Tube size: 8.0 mm Number of attempts: 1 Airway Equipment and Method: Stylet Placement Confirmation: ETT inserted through vocal cords under direct vision,  positive ETCO2 and breath sounds checked- equal and bilateral Secured at: 22 cm Tube secured with: Tape Dental Injury: Teeth and Oropharynx as per pre-operative assessment

## 2013-12-05 NOTE — Op Note (Signed)
12/04/2013 - 12/05/2013  8:19 PM  PATIENT:  Kevin [name]  73 y.o. 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 causing left to right shift, ventricular effacement,   PRE-OPERATIVE DIAGNOSIS:   Left chronic Subdural Hematoma  POST-OPERATIVE DIAGNOSIS: Left chronic Subdural Hematoma  PROCEDURE:  Procedure(s): Burr holes for  HEMATOMA EVACUATION SUBDURAL- LEFT  SURGEON:  Surgeon(s): Ashok Pall, MD Floyce Stakes, MD  ASSISTANTS:Botero, Stann Mainland  ANESTHESIA:   general  EBL:  Total I/O In: 1300 [I.V.:1300] Out: 175 [Urine:75; Blood:100]  BLOOD ADMINISTERED:none   COUNT:per nursing  DRAINS: (2) Jackson-Pratt drain(s) with closed bulb suction in the subdural space   SPECIMEN:  No Specimen  DICTATION: [name] was taken to the operating room, intubated and placed under a general anesthetic without difficulty. An arterial line was placed under sterile conditions. He after adequate anesthesia was obtained, was placed in a 3 pin Mayfield head holder with his head turned towards the right. He was secured to the operating table with the Mayfield adapter. His hair was shaved and his head prepped in a sterile manner. We opened the reverse question mark incision with a 10 blade and reflected the scalp and temporalis anteriorly. We created 3 burr holes with the drill, left frontal, parietal, and left temporal. Dark red fluid emanated from the burr holes under pressure. We irrigated copiously to remove as much as the blood as possible. We placed JP drains in the parietal burr hole and the Frontal burr hole. The parietal jp was directed rostrally and laterally. The frontal jp was directed caudally and laterally.  We then brought the JP's through the scalp. We closed the wound in layers approximating the temporalis, then the galea, then the scalp edges. I used vicryls subcutaneously and staples on the skin. I applied a sterile dressing. He was extubated in the OR.   PLAN OF  CARE: Admit to inpatient   PATIENT DISPOSITION:  PACU - hemodynamically stable.   Delay start of Pharmacological VTE agent (>24hrs) due to surgical blood loss or risk of bleeding:  yes

## 2013-12-05 NOTE — Progress Notes (Signed)
Dr. Christella Noa informed pt R JP drain has put out 120 since being in PACU.  No new orders and will continue to monitor.

## 2013-12-05 NOTE — Transfer of Care (Signed)
Immediate Anesthesia Transfer of Care Note  Patient: Kevin [name]  Procedure(s) Performed: Procedure(s) with comments: CRANIOTOMY HEMATOMA EVACUATION SUBDURAL- LEFT (Left) - CRANIOTOMY HEMATOMA EVACUATION SUBDURAL- LEFT  Patient Location: PACU  Anesthesia Type:General  Level of Consciousness: awake, sedated and patient cooperative  Airway & Oxygen Therapy: Patient connected to face mask oxygen  Post-op Assessment: Report given to PACU RN, Post -op Vital signs reviewed and stable and Patient moving all extremities X 4  Post vital signs: Reviewed and stable  Complications: No apparent anesthesia complications

## 2013-12-05 NOTE — Anesthesia Preprocedure Evaluation (Addendum)
Anesthesia Evaluation  Patient identified by MRN, date of birth, ID band Patient awake    Reviewed: Allergy & Precautions, H&P , NPO status , Patient's Chart, lab work & pertinent test results  Airway Mallampati: II  Neck ROM: full    Dental   Pulmonary COPDformer smoker,          Cardiovascular hypertension, + CAD, + Peripheral Vascular Disease and +CHF + Valvular Problems/Murmurs     Neuro/Psych    GI/Hepatic   Endo/Other    Renal/GU CRFRenal disease     Musculoskeletal  (+) Arthritis -,   Abdominal   Peds  Hematology   Anesthesia Other Findings Subdural bleed  Reproductive/Obstetrics                         Anesthesia Physical Anesthesia Plan  ASA: III  Anesthesia Plan: General   Post-op Pain Management:    Induction: Intravenous  Airway Management Planned: Oral ETT  Additional Equipment:   Intra-op Plan:   Post-operative Plan: Possible Post-op intubation/ventilation  Informed Consent: I have reviewed the patients History and Physical, chart, labs and discussed the procedure including the risks, benefits and alternatives for the proposed anesthesia with the patient or authorized representative who has indicated his/her understanding and acceptance.     Plan Discussed with: CRNA, Anesthesiologist and Surgeon  Anesthesia Plan Comments:         Anesthesia Quick Evaluation

## 2013-12-06 DIAGNOSIS — I158 Other secondary hypertension: Secondary | ICD-10-CM

## 2013-12-06 MED ORDER — HYDRALAZINE HCL 20 MG/ML IJ SOLN
10.0000 mg | INTRAMUSCULAR | Status: DC | PRN
  Administered 2013-12-06 (×2): 10 mg via INTRAVENOUS
  Filled 2013-12-06 (×2): qty 1

## 2013-12-06 NOTE — Progress Notes (Signed)
PULMONARY / CRITICAL CARE MEDICINE   Name: Kevin [name] MRN: [id number] DOB: Jan 23, 1941    ADMISSION DATE:  12/04/2013 CONSULTATION DATE:  12/04/2013  REFERRING MD :  Hal Neer  CHIEF COMPLAINT:  AMS  INITIAL PRESENTATION: 73 year old male presented to [facility] with AMS x 2-3 days. He has recently had falls as and takes xarelto. Head CT showed subdural hematoma on Left with resultant midline shift of 2cm. He was admitted by neurosurgery.  PCCM asked to assist with medical management.   STUDIES:  9/22 CT Head: Large left subdural hematoma with considerable mass-effect   SIGNIFICANT EVENTS: 9/22: Admitted for SDH.   SUBJECTIVE: No events overnight.  To OR for evacuation 9/23.  VITAL SIGNS: Temp:  [97.7 F (36.5 C)-100.4 F (38 C)] 100.4 F (38 C) (09/24 0800) Pulse Rate:  [40-80] 80 (09/24 1000) Resp:  [13-23] 19 (09/24 1000) BP: (101-172)/(64-99) 127/76 mmHg (09/24 1000) SpO2:  [94 %-100 %] 98 % (09/24 1000) Arterial Line BP: (153-181)/(72-90) 176/84 mmHg (09/24 1000) HEMODYNAMICS:   VENTILATOR SETTINGS:   INTAKE / OUTPUT:  Intake/Output Summary (Last 24 hours) at 12/06/13 1012 Last data filed at 12/06/13 1000  Gross per 24 hour  Intake 3103.75 ml  Output   1400 ml  Net 1703.75 ml    PHYSICAL EXAMINATION: General:  Overweight male in NAD Neuro:  Alert, oriented, moderately confused. HEENT:  [place]/AT, PERRL, No JVD noted Cardiovascular:  RRR Lungs:  Clear anterior lung sounds Abdomen:  Soft, non-tender, non-distended Musculoskeletal:  Remote LLE AKA. Otherwise without deformity.  Skin:  Intact  LABS:  CBC  Recent Labs Lab 12/04/13 2059 12/04/13 2120 12/05/13 0236  WBC 5.9  --  5.5  HGB 15.7 17.7* 14.6  HCT 48.8 52.0 46.8  PLT 168  --  158   Coag's  Recent Labs Lab 12/05/13 0236  INR 1.00   BMET  Recent Labs Lab 12/04/13 2120 12/05/13 0236  NA 142 143  K 3.5* 4.1  CL 107 106  CO2  --  25  BUN 27* 25*  CREATININE 1.50* 1.36*  GLUCOSE 84  103*   Electrolytes  Recent Labs Lab 12/05/13 0236  CALCIUM 9.0   Sepsis Markers No results found for this basename: LATICACIDVEN, PROCALCITON, O2SATVEN,  in the last 168 hours ABG No results found for this basename: PHART, PCO2ART, PO2ART,  in the last 168 hours Liver Enzymes No results found for this basename: AST, ALT, ALKPHOS, BILITOT, ALBUMIN,  in the last 168 hours Cardiac Enzymes No results found for this basename: TROPONINI, PROBNP,  in the last 168 hours Glucose  Recent Labs Lab 12/04/13 2123  GLUCAP 93    Imaging Dg Chest Port 1 View  12/05/2013   CLINICAL DATA:  Central line placement.  EXAM: PORTABLE CHEST - 1 VIEW  COMPARISON:  12/04/2013.  FINDINGS: The right IJ catheter tip is in the mid SVC at the level of the carina. The cardiac silhouette, mediastinal and hilar contours are stable. The lungs remain clear.  IMPRESSION: Right IJ central venous catheter in the mid SVC. No complicating features.   Electronically Signed   By: Kalman Jewels M.D.   On: 12/05/2013 22:00     ASSESSMENT / PLAN:  PULMONARY A: No acute issues  P:   Monitor  CARDIOVASCULAR A: HTN  H/o CHF (echo 2013 LVEF 50-55%)  P:  SBP goal < 160 mm/Hg Followed by cardiology Burt Knack) as outpt Continue preadmission coreg, but will need a swallow evaluation today Hold preadmission atorvastatin  RENAL A:   CKD creat at baseline 1.5, oliguria Mild hypokalemia  P:   Gentle hydration NS 75/h until able to eat regularly. 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  RASS goal: 0 Monitor  I have personally obtained a history, examined the patient, evaluated laboratory and imaging results, formulated the assessment and plan and placed orders.  Rush Farmer, M.D. [facility]. Pager: [phone]. After hours pager: [phone].

## 2013-12-06 NOTE — Progress Notes (Signed)
PULMONARY / CRITICAL CARE MEDICINE   Name: Kevin [name] MRN: [id number] DOB: 1941/03/03    ADMISSION DATE:  12/04/2013 CONSULTATION DATE:  12/04/2013  REFERRING MD :  Hal Neer  CHIEF COMPLAINT:  AMS  INITIAL PRESENTATION: 73 year old male presented to [facility] with AMS x 2-3 days. He has recently had falls as and takes xarelto. Head CT showed subdural hematoma on Left with resultant midline shift of 2cm. He was admitted by neurosurgery.  PCCM asked to assist with medical management.   STUDIES:  9/22 CT Head: Large left subdural hematoma with considerable mass-effect   SIGNIFICANT EVENTS: 9/22: Admitted for SDH.   SUBJECTIVE: No events overnight.  VITAL SIGNS: Temp:  [97.7 F (36.5 C)-100.4 F (38 C)] 100.4 F (38 C) (09/24 0800) Pulse Rate:  [40-80] 80 (09/24 0800) Resp:  [13-23] 16 (09/24 0800) BP: (101-172)/(64-99) 137/75 mmHg (09/24 0800) SpO2:  [94 %-100 %] 99 % (09/24 0800) Arterial Line BP: (153-181)/(72-90) 161/78 mmHg (09/24 0800) HEMODYNAMICS:   VENTILATOR SETTINGS:   INTAKE / OUTPUT:  Intake/Output Summary (Last 24 hours) at 12/06/13 1007 Last data filed at 12/06/13 1000  Gross per 24 hour  Intake 3103.75 ml  Output   1400 ml  Net 1703.75 ml    PHYSICAL EXAMINATION: General:  Overweight male in NAD Neuro:  Alert, oriented, moderately confused, oriented to name & birthday only HEENT:  [place]/AT, PERRL, No JVD noted Cardiovascular:  RRR Lungs:  Clear anterior lung sounds Abdomen:  Soft, non-tender, non-distended Musculoskeletal:  Remote LLE AKA. Otherwise without deformity.  Skin:  Intact  LABS:  CBC  Recent Labs Lab 12/04/13 2059 12/04/13 2120 12/05/13 0236  WBC 5.9  --  5.5  HGB 15.7 17.7* 14.6  HCT 48.8 52.0 46.8  PLT 168  --  158   Coag's  Recent Labs Lab 12/05/13 0236  INR 1.00   BMET  Recent Labs Lab 12/04/13 2120 12/05/13 0236  NA 142 143  K 3.5* 4.1  CL 107 106  CO2  --  25  BUN 27* 25*  CREATININE 1.50* 1.36*  GLUCOSE  84 103*   Electrolytes  Recent Labs Lab 12/05/13 0236  CALCIUM 9.0   Sepsis Markers No results found for this basename: LATICACIDVEN, PROCALCITON, O2SATVEN,  in the last 168 hours ABG No results found for this basename: PHART, PCO2ART, PO2ART,  in the last 168 hours Liver Enzymes No results found for this basename: AST, ALT, ALKPHOS, BILITOT, ALBUMIN,  in the last 168 hours Cardiac Enzymes No results found for this basename: TROPONINI, PROBNP,  in the last 168 hours Glucose  Recent Labs Lab 12/04/13 2123  GLUCAP 93    Imaging Dg Chest Port 1 View  12/05/2013   CLINICAL DATA:  Central line placement.  EXAM: PORTABLE CHEST - 1 VIEW  COMPARISON:  12/04/2013.  FINDINGS: The right IJ catheter tip is in the mid SVC at the level of the carina. The cardiac silhouette, mediastinal and hilar contours are stable. The lungs remain clear.  IMPRESSION: Right IJ central venous catheter in the mid SVC. No complicating features.   Electronically Signed   By: Kalman Jewels M.D.   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  RASS goal: 0 Monitor  For service rendered on 12/05/2013.  I have personally obtained a history, examined the patient, evaluated laboratory and imaging results, formulated the assessment and plan and placed orders.  Rush Farmer, M.D. [facility] Pulmonary/Critical Care Medicine. Pager: [phone]. After hours pager: [phone].

## 2013-12-06 NOTE — Evaluation (Signed)
Clinical/Bedside Swallow Evaluation Patient Details  Name: DEVAUGHN SAVANT MRN: [id number] Date of Birth: [birth year]  Today's Date: 12/06/2013 Time: 7371-0626 SLP Time Calculation (min): 20 min  Past Medical History:  Past Medical History  Diagnosis Date  . Arthritis   . Hypertension   . CHF (congestive heart failure)     a. NICM/acute CHF 2007 with EF 10-20%. b. Improved EF 50-55% by echo 12/2011.  Marland Kitchen Hyperlipidemia   . Coronary artery disease     a. NSTEMI 2007 with cath showing mild nonobstructive CAD by cath 2007 (40% mLAD, 40%mRCA) at time of dx of NICM.  Marland Kitchen Tortuous aorta     a. Noted by cath 2007, recommendation for OP [place].  Marland Kitchen BPH (benign prostatic hyperplasia)   . Wide-complex tachycardia     a. Noted 2007.  . Obesity   . Valvular heart disease     a. Mod MR by echo 06/2005 but no significant MR 2013.   Past Surgical History:  Past Surgical History  Procedure Laterality Date  . No past surgeries    . Vena cava filter placement  01/03/2012    Procedure: INSERTION VENA-CAVA FILTER;  Surgeon: Rosetta Posner, MD;  Location: [place];  Service: Vascular;  Laterality: Left;  . Amputation  01/05/2012    Procedure: AMPUTATION ABOVE KNEE;  Surgeon: Rosetta Posner, MD;  Location: [facility] OR;  Service: Vascular;  Laterality: Left;   HPI:  73 year old male presented to [facility] 12/04/13 with AMS x 2-3 days. He has recently had falls and takes xarelto. Head CT showed subdural hematoma on Left with resultant midline shift of 2cm. Pt underwent evacuation on 9/23.   Assessment / Plan / Recommendation Clinical Impression  Pt presents wtih mild oral weakness and cognitive impairments that impact primarily pt's oral phase of swallowing. Pt required Min cues from SLP for slow rate of intake and clearance of right-sided pocketing. Despite impulsivity and reduced awareness, pt did not show signs of pharyngeal dysphagia or laryngeal penetration. Recommend to initiate Dys 3 textures and thin liquids with brief  SLP f/u for tolerance and utilization of compensatory strategies.    Aspiration Risk  Mild    Diet Recommendation Dysphagia 3 (Mechanical Soft);Thin liquid   Liquid Administration via: Cup;Straw Medication Administration: Whole meds with puree Supervision: Patient able to self feed;Intermittent supervision to cue for compensatory strategies Compensations: Slow rate;Small sips/bites;Check for pocketing Postural Changes and/or Swallow Maneuvers: Seated upright 90 degrees    Other  Recommendations Oral Care Recommendations: Oral care BID   Follow Up Recommendations  Inpatient Rehab    Frequency and Duration min 2x/week  1 week   Pertinent Vitals/Pain n/a    SLP Swallow Goals     Swallow Study Prior Functional Status       General Date of Onset: 12/04/13 HPI: 73 year old male presented to [facility] 12/04/13 with AMS x 2-3 days. He has recently had falls and takes xarelto. Head CT showed subdural hematoma on Left with resultant midline shift of 2cm. Pt underwent evacuation on 9/23. Type of Study: Bedside swallow evaluation Previous Swallow Assessment: none in chart Diet Prior to this Study: NPO Temperature Spikes Noted: Yes (low grade) Respiratory Status: Room air History of Recent Intubation: Yes Length of Intubations (days): 1 days (for procedure) Date extubated: 12/05/13 Behavior/Cognition: Alert;Cooperative;Requires cueing Oral Cavity - Dentition: Poor condition;Missing dentition Self-Feeding Abilities: Able to feed self Patient Positioning: Upright in bed Baseline Vocal Quality: Other (comment) (rough) Volitional Cough: Weak Volitional Swallow:  Able to elicit    Oral/Motor/Sensory Function Overall Oral Motor/Sensory Function: Impaired (difficulty following commands for thorough assessment) Labial ROM: Reduced right (mild) Labial Symmetry: Abnormal symmetry right Labial Strength: Reduced Lingual ROM: Within Functional Limits Lingual Symmetry: Within Functional  Limits Lingual Strength: Reduced Facial ROM: Reduced right (mild) Velum: Within Functional Limits Mandible: Within Functional Limits   Ice Chips Ice chips: Not tested   Thin Liquid Thin Liquid: Within functional limits Presentation: Cup;Self Fed;Straw    Nectar Thick Nectar Thick Liquid: Not tested   Honey Thick Honey Thick Liquid: Not tested   Puree Puree: Not tested   Solid   GO    Solid: Impaired Presentation: Self Fed Oral Phase Impairments: Poor awareness of bolus;Reduced lingual movement/coordination Oral Phase Functional Implications: Right lateral sulci pocketing        [name], M.A. CCC-SLP [phone]  [name] 12/06/2013,2:21 PM

## 2013-12-06 NOTE — Progress Notes (Signed)
0042 paged Dr. Christella Noa. Patient not following commands, agitated, attempting to pull at head dressing. While on the phone with MD patient began to follow commands again. Patient safety mitts applied.   0117 patient c/o of nausea. Zofran administered. [place] aware. Pt now more awake and alert. Able to communicate that his head hurts and is nauseated. Speech still garbled at times.

## 2013-12-06 NOTE — Progress Notes (Signed)
Patient ID: Kevin [name], male   DOB: 1940/11/12, 73 y.o.   MRN: [id number] BP 138/76  Pulse 80  Temp(Src) 100.4 F (38 C) (Axillary)  Resp 19  Ht 5\' 7"  (1.702 m)  Wt 72.576 kg (160 lb)  BMI 25.05 kg/m2  SpO2 96% Alert, oriented to person, does not know place, situation Will follow some commands Will move all extremities Wound clean, dry, some drainage on bandages jp's drainingwell

## 2013-12-06 NOTE — ED Provider Notes (Signed)
Medical screening examination/treatment/procedure(s) were conducted as a shared visit with non-physician practitioner(s) and myself.  I personally evaluated the patient during the encounter.  Patient is an elderly male with history of CVA, CAD.  Presents with complaints of headache, confusion, slurred speech for the past several days.  He has been unsteady on his feet and weak, and has been falling a lot.    On exam, vitals are stable and he is afebrile.  Head is AT, .  Neck is supple.  There is no cervical spine ttp or stepoffs.  Heart is rrr and lungs are clear.  Neurologically, the patient is slow to respond, but is other wise intact.    Workup reveals a large SDH with midline shift.  Neurosurgery has been consulted and will evaluate the patient.  CRITICAL CARE Performed by: Veryl Speak Total critical care time: 30 minutes Critical care time was exclusive of separately billable procedures and treating other patients. Critical care was necessary to treat or prevent imminent or life-threatening deterioration. Critical care was time spent personally by me on the following activities: development of treatment plan with patient and/or surrogate as well as nursing, discussions with consultants, evaluation of patient's response to treatment, examination of patient, obtaining history from patient or surrogate, ordering and performing treatments and interventions, ordering and review of laboratory studies, ordering and review of radiographic studies, pulse oximetry and re-evaluation of patient's condition.      Veryl Speak, MD 12/06/13 [phone]

## 2013-12-06 NOTE — Progress Notes (Signed)
1207 called Elink regarding elevated BP and patient bradycardic. Orders given for hydralazine 10 mg PRN q 2 hours.

## 2013-12-07 ENCOUNTER — Encounter (HOSPITAL_COMMUNITY): Payer: Self-pay | Admitting: Neurosurgery

## 2013-12-07 DIAGNOSIS — I10 Essential (primary) hypertension: Secondary | ICD-10-CM

## 2013-12-07 DIAGNOSIS — I2699 Other pulmonary embolism without acute cor pulmonale: Secondary | ICD-10-CM

## 2013-12-07 LAB — BASIC METABOLIC PANEL
ANION GAP: 13 (ref 5–15)
BUN: 16 mg/dL (ref 6–23)
CALCIUM: 7.7 mg/dL — AB (ref 8.4–10.5)
CO2: 22 mEq/L (ref 19–32)
Chloride: 107 mEq/L (ref 96–112)
Creatinine, Ser: 1.35 mg/dL (ref 0.50–1.35)
GFR, EST AFRICAN AMERICAN: 59 mL/min — AB (ref >90)
GFR, EST NON AFRICAN AMERICAN: 50 mL/min — AB (ref >90)
Glucose, Bld: 100 mg/dL — ABNORMAL HIGH (ref 70–99)
Potassium: 4 mEq/L (ref 3.7–5.3)
SODIUM: 142 meq/L (ref 137–147)

## 2013-12-07 LAB — PHOSPHORUS: Phosphorus: 2.7 mg/dL (ref 2.3–4.6)

## 2013-12-07 LAB — CBC
HCT: 38.2 % — ABNORMAL LOW (ref 39.0–52.0)
Hemoglobin: 11.9 g/dL — ABNORMAL LOW (ref 13.0–17.0)
MCH: 26.2 pg (ref 26.0–34.0)
MCHC: 31.2 g/dL (ref 30.0–36.0)
MCV: 84 fL (ref 78.0–100.0)
PLATELETS: 120 10*3/uL — AB (ref 150–400)
RBC: 4.55 MIL/uL (ref 4.22–5.81)
RDW: 17.5 % — ABNORMAL HIGH (ref 11.5–15.5)
WBC: 5.2 10*3/uL (ref 4.0–10.5)

## 2013-12-07 LAB — MAGNESIUM: MAGNESIUM: 1.8 mg/dL (ref 1.5–2.5)

## 2013-12-07 MED ORDER — PANTOPRAZOLE SODIUM 40 MG PO TBEC
40.0000 mg | DELAYED_RELEASE_TABLET | Freq: Every day | ORAL | Status: DC
  Administered 2013-12-07 – 2013-12-12 (×6): 40 mg via ORAL
  Filled 2013-12-07 (×6): qty 1

## 2013-12-07 MED ORDER — LEVETIRACETAM 500 MG PO TABS
500.0000 mg | ORAL_TABLET | Freq: Two times a day (BID) | ORAL | Status: DC
  Administered 2013-12-07 – 2013-12-13 (×13): 500 mg via ORAL
  Filled 2013-12-07 (×14): qty 1

## 2013-12-07 NOTE — Progress Notes (Signed)
PULMONARY / CRITICAL CARE MEDICINE   Name: Kevin [name] MRN: [id number] DOB: November 24, 1940    ADMISSION DATE:  12/04/2013 CONSULTATION DATE:  12/04/2013  REFERRING MD :  Hal Neer  CHIEF COMPLAINT:  AMS  INITIAL PRESENTATION: 73 year old male presented to [facility] with AMS x 2-3 days. He has recently had falls as and takes xarelto. Head CT showed subdural hematoma on Left with resultant midline shift of 2cm. He was admitted by neurosurgery.  PCCM asked to assist with medical management.   STUDIES:  9/22 CT Head: Large left subdural hematoma with considerable mass-effect   SIGNIFICANT EVENTS: 9/22: Admitted for SDH.  9/23 surgical evacuation  SUBJECTIVE: No events overnight.  Passed swallow evaluation and much more interactive.  VITAL SIGNS: Temp:  [97.9 F (36.6 C)-99.9 F (37.7 C)] 97.9 F (36.6 C) (09/25 0800) Pulse Rate:  [45-85] 59 (09/25 0900) Resp:  [13-25] 13 (09/25 0900) BP: (107-166)/(65-99) 162/67 mmHg (09/25 0900) SpO2:  [96 %-100 %] 99 % (09/25 0900) Arterial Line BP: (176)/(84) 176/84 mmHg (09/24 1000)  HEMODYNAMICS:   VENTILATOR SETTINGS:   INTAKE / OUTPUT:  Intake/Output Summary (Last 24 hours) at 12/07/13 0933 Last data filed at 12/07/13 0900  Gross per 24 hour  Intake    495 ml  Output   1695 ml  Net  -1200 ml   PHYSICAL EXAMINATION: General:  Overweight male in NAD on RA Neuro:  Alert, oriented, moderately confused. HEENT:  [place]/AT, PERRL, No JVD noted Cardiovascular:  RRR Lungs: Clear anterior lung sounds Abdomen:  Soft, non-tender, non-distended Musculoskeletal:  Remote LLE AKA. Otherwise without deformity.  Skin:  Intact  LABS:  CBC  Recent Labs Lab 12/04/13 2059 12/04/13 2120 12/05/13 0236 12/07/13 0620  WBC 5.9  --  5.5 5.2  HGB 15.7 17.7* 14.6 11.9*  HCT 48.8 52.0 46.8 38.2*  PLT 168  --  158 120*   Coag's  Recent Labs Lab 12/05/13 0236  INR 1.00   BMET  Recent Labs Lab 12/04/13 2120 12/05/13 0236 12/07/13 0620  NA  142 143 142  K 3.5* 4.1 4.0  CL 107 106 107  CO2  --  25 22  BUN 27* 25* 16  CREATININE 1.50* 1.36* 1.35  GLUCOSE 84 103* 100*   Electrolytes  Recent Labs Lab 12/05/13 0236 12/07/13 0620  CALCIUM 9.0 7.7*  MG  --  1.8  PHOS  --  2.7   Sepsis Markers No results found for this basename: LATICACIDVEN, PROCALCITON, O2SATVEN,  in the last 168 hours ABG No results found for this basename: PHART, PCO2ART, PO2ART,  in the last 168 hours Liver Enzymes No results found for this basename: AST, ALT, ALKPHOS, BILITOT, ALBUMIN,  in the last 168 hours Cardiac Enzymes No results found for this basename: TROPONINI, PROBNP,  in the last 168 hours Glucose  Recent Labs Lab 12/04/13 2123  GLUCAP 93   Imaging No results found.  ASSESSMENT / PLAN:  PULMONARY A: No acute issues  P:   Monitor off O2, RA. Passed swallow evaluation.  CARDIOVASCULAR A: HTN  H/o CHF (echo 2013 LVEF 50-55%)  P:  SBP goal < 160 mm/Hg Followed by cardiology Burt Knack) as outpt Continue preadmission coreg Hold preadmission atorvastatin for now  RENAL A:   CKD creat at baseline 1.5, oliguria Mild hypokalemia  P:   KVO IVF. Hold lasix Follow BMP Replace electrolytes as indicated  GASTROINTESTINAL A:   No acute issues  P:   Swallow evaluation today passed, dysphagia 3 diet. SUP:  IV protonix.  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  Rush Farmer, M.D. [facility] Pulmonary/Critical Care Medicine. Pager: [phone]. After hours pager: [phone].

## 2013-12-07 NOTE — Anesthesia Postprocedure Evaluation (Signed)
Anesthesia Post Note  Patient: Kevin [name]  Procedure(s) Performed: Procedure(s) (LRB): CRANIOTOMY HEMATOMA EVACUATION SUBDURAL- LEFT (Left)  Anesthesia type: General  Patient location: PACU  Post pain: Pain level controlled and Adequate analgesia  Post assessment: Post-op Vital signs reviewed, Patient's Cardiovascular Status Stable, Respiratory Function Stable, Patent Airway and Pain level controlled  Last Vitals:  Filed Vitals:   12/07/13 0900  BP: 162/67  Pulse: 59  Temp:   Resp: 13    Post vital signs: Reviewed and stable  Level of consciousness: awake, alert  and oriented  Complications: No apparent anesthesia complications

## 2013-12-07 NOTE — Progress Notes (Signed)
Speech Language Pathology Treatment: Dysphagia  Patient Details Name: Kevin [name] MRN: [id number] DOB: 15-Aug-1940 Today's Date: 12/07/2013 Time: 9774-1423 SLP Time Calculation (min): 9 min  Assessment / Plan / Recommendation Clinical Impression  Pt was more lethargic this afternoon, with lunch tray untouched at his bedside. Pt was easily aroused and agreeable to cup sips of thin liquid, declined solids. Pt was mildly more impulsive today, requiring Mod cues from SLP for smaller sips. A delayed cough was noted x1. Will continue to follow.   HPI HPI: 73 year old male presented to [facility] 12/04/13 with AMS x 2-3 days. He has recently had falls and takes xarelto. Head CT showed subdural hematoma on Left with resultant midline shift of 2cm. Pt underwent evacuation on 9/23.   Pertinent Vitals Pain Assessment: No/denies pain  SLP Plan  Continue with current plan of care    Recommendations Diet recommendations: Dysphagia 3 (mechanical soft);Thin liquid Liquids provided via: Cup;Straw Medication Administration: Whole meds with puree Supervision: Patient able to self feed;Intermittent supervision to cue for compensatory strategies Compensations: Slow rate;Small sips/bites;Check for pocketing Postural Changes and/or Swallow Maneuvers: Seated upright 90 degrees              Oral Care Recommendations: Oral care BID Follow up Recommendations: Inpatient Rehab Plan: Continue with current plan of care    GO      Kevin [name], M.A. CCC-SLP [phone]  Kevin [name] 12/07/2013, 1:49 PM

## 2013-12-07 NOTE — Progress Notes (Signed)
Patient ID: Kevin [name], male   DOB: 28-May-1940, 73 y.o.   MRN: [id number] BP 146/84  Pulse 51  Temp(Src) 97.5 F (36.4 C) (Oral)  Resp 14  Ht 5\' 7"  (1.702 m)  Wt 72.576 kg (160 lb)  BMI 25.05 kg/m2  SpO2 98% I/O last 3 completed shifts: In: 78 [P.O.:410; I.V.:385; IV Piggyback:200] Out: 2065 [Urine:1365; Drains:700] Alert and oriented to person, place, situation, and time JP's draining well Moving all extremities well Will order head ct for tomorrow.

## 2013-12-08 ENCOUNTER — Inpatient Hospital Stay (HOSPITAL_COMMUNITY): Payer: Medicare Other

## 2013-12-08 LAB — CBC
HCT: 39.8 % (ref 39.0–52.0)
Hemoglobin: 12.6 g/dL — ABNORMAL LOW (ref 13.0–17.0)
MCH: 26.1 pg (ref 26.0–34.0)
MCHC: 31.7 g/dL (ref 30.0–36.0)
MCV: 82.4 fL (ref 78.0–100.0)
PLATELETS: 127 10*3/uL — AB (ref 150–400)
RBC: 4.83 MIL/uL (ref 4.22–5.81)
RDW: 17 % — AB (ref 11.5–15.5)
WBC: 5.3 10*3/uL (ref 4.0–10.5)

## 2013-12-08 LAB — BASIC METABOLIC PANEL
Anion gap: 11 (ref 5–15)
BUN: 17 mg/dL (ref 6–23)
CO2: 24 meq/L (ref 19–32)
CREATININE: 1.2 mg/dL (ref 0.50–1.35)
Calcium: 8.4 mg/dL (ref 8.4–10.5)
Chloride: 101 mEq/L (ref 96–112)
GFR calc non Af Amer: 58 mL/min — ABNORMAL LOW (ref >90)
GFR, EST AFRICAN AMERICAN: 67 mL/min — AB (ref >90)
Glucose, Bld: 92 mg/dL (ref 70–99)
Potassium: 4 mEq/L (ref 3.7–5.3)
Sodium: 136 mEq/L — ABNORMAL LOW (ref 137–147)

## 2013-12-08 LAB — MAGNESIUM: Magnesium: 1.9 mg/dL (ref 1.5–2.5)

## 2013-12-08 LAB — PHOSPHORUS: Phosphorus: 2.6 mg/dL (ref 2.3–4.6)

## 2013-12-08 NOTE — Progress Notes (Signed)
Subjective: Patient reports Alert and oriented. Offers no complaints. Has tolerated breakfast somewhat  Objective: Vital signs in last 24 hours: Temp:  [97.5 F (36.4 C)-99 F (37.2 C)] 97.9 F (36.6 C) (09/26 0838) Pulse Rate:  [39-71] 61 (09/26 0900) Resp:  [12-21] 18 (09/26 0900) BP: (117-160)/(72-102) 149/87 mmHg (09/26 0900) SpO2:  [96 %-100 %] 97 % (09/26 0900)  Intake/Output from previous day: 09/25 0701 - 09/26 0700 In: 445 [P.O.:360; I.V.:85] Out: 1110 [Urine:670; Drains:440] Intake/Output this shift: Total I/O In: 240 [P.O.:240] Out: 65 [Drains:65]  Motor function appears to be intact. CT scan today demonstrates improvement in subdural collection with markedly left shift.  Lab Results:  Recent Labs  12/07/13 0620 12/08/13 0310  WBC 5.2 5.3  HGB 11.9* 12.6*  HCT 38.2* 39.8  PLT 120* 127*   BMET  Recent Labs  12/07/13 0620 12/08/13 0310  NA 142 136*  K 4.0 4.0  CL 107 101  CO2 22 24  GLUCOSE 100* 92  BUN 16 17  CREATININE 1.35 1.20  CALCIUM 7.7* 8.4    Studies/Results: Ct Head Wo Contrast  12/08/2013   CLINICAL DATA:  Evaluate subdural hematoma post evacuation.  EXAM: CT HEAD WITHOUT CONTRAST  TECHNIQUE: Contiguous axial images were obtained from the base of the skull through the vertex without intravenous contrast.  COMPARISON:  12/04/2013 and 11/27/2011  FINDINGS: Examination demonstrates a metallic density within the subcutaneous fat of the left temporal region as well as over the occipital region unchanged. Skin staples are present over the left temporoparietal region as patient has undergone interval evacuation of the left convexity subdural hematoma. There is a subdural drain entering the left frontal skull with tip over the left occipital region. A second subdural drain enters the left posterior parietal skull with tip over the left temporoparietal region. There has been a significant reduction in size of the left convexity subdural hematoma post  evacuation. The small residual left convexity subdural hematoma with subdural air remains. The subdural collection measures approximately 1.1 cm in thickness over the left frontal region and 9 mm over the left posterior parietal region. There is less midline shift to the right which now measures approximately 7-8 mm (previously 17 mm). There is continued mild mass effect throughout the left hemisphere. There are several focal collections of acute extra-axial hemorrhage along the right parafalcine region likely subarachnoid in location with the largest measuring 8 x 13 mm. A slight worsening of a subdural collection along the posterior left parafalcine region measuring 6 mm in thickness which extends down along the left tentorium. The remainder the exam is unchanged.  IMPRESSION: Moderate decrease in size patient's left subdural hematoma post evacuation with residual subdural hemorrhage and air present. The subdural collection now measures approximately 1.1 cm in thickness over the frontal region and 9 mm over the posterior left parietal region. Continued mass effect on the left hemisphere with less midline shift to the right of 7-8 mm. Two left-sided subdural drains in place as described.  New foci of acute extra-axial hemorrhage along the right parafalcine region with the largest measuring 8 x 13 mm likely subarachnoid in location.  Slight worsening of an acute left parafalcine subdural collection measuring 6 mm in thickness extending to the left tentorium.  These results were called by telephone at the time of interpretation on 12/08/2013 at 7:31 am to Dr. Pam Drown, who verbally acknowledged these results.   Electronically Signed   By: Marin Olp M.D.   On: 12/08/2013 07:32  Assessment/Plan: CT scan shows improvement in shift with subtle changes as noted above.  LOS: 4 days  Remove some dural drains without difficulty. Incision appears clean and dry. Drain sites stapled shut. Clean dressing  applied.   Kiyanna Biegler J 12/08/2013, 10:05 AM

## 2013-12-09 NOTE — Progress Notes (Signed)
Patient ID: Kevin [name], male   DOB: [birth year], 73 y.o.   MRN: [id number] Status post removal of subdural drains. Patient's neurologic status remained stable He has however had substantial drainage from posterior drain sites despite having staples in incision. Maintained in the ICU for observation, CT of head in a.m. for followup.

## 2013-12-09 NOTE — Progress Notes (Signed)
PULMONARY / CRITICAL CARE MEDICINE   Name: Kevin [name] MRN: [id number] DOB: July 02, 1940    ADMISSION DATE:  12/04/2013 CONSULTATION DATE:  12/04/2013  REFERRING MD :  Hal Neer  CHIEF COMPLAINT:  AMS  INITIAL PRESENTATION: 73 year old male presented to [facility] with AMS x 2-3 days. He has recently had falls as and takes xarelto. Head CT showed subdural hematoma on Left with resultant midline shift of 2cm. He was admitted by neurosurgery.  PCCM asked to assist with medical management.   STUDIES:  9/22 CT Head: Large left subdural hematoma with considerable mass-effect   SIGNIFICANT EVENTS: 9/22: Admitted for SDH.  9/23 surgical evacuation  SUBJECTIVE: No events overnight.  Passed swallow evaluation and much more interactive.  VITAL SIGNS: Temp:  [97.7 F (36.5 C)-98.2 F (36.8 C)] 98.2 F (36.8 C) (09/27 0744) Pulse Rate:  [43-73] 60 (09/27 1000) Resp:  [11-20] 13 (09/27 1000) BP: (125-168)/(73-99) 151/92 mmHg (09/27 1000) SpO2:  [95 %-100 %] 98 % (09/27 1000)  HEMODYNAMICS:   VENTILATOR SETTINGS:   INTAKE / OUTPUT:  Intake/Output Summary (Last 24 hours) at 12/09/13 1100 Last data filed at 12/09/13 0329  Gross per 24 hour  Intake    240 ml  Output   1125 ml  Net   -885 ml   PHYSICAL EXAMINATION: General:  Overweight male in NAD on RA Neuro:  Alert, oriented, moderately confused. HEENT:  Walker/AT, PERRL, No JVD noted Cardiovascular:  RRR Lungs: Clear anterior lung sounds Abdomen:  Soft, non-tender, non-distended Musculoskeletal:  Remote LLE AKA. Otherwise without deformity.  Skin:  Intact  LABS:  CBC  Recent Labs Lab 12/05/13 0236 12/07/13 0620 12/08/13 0310  WBC 5.5 5.2 5.3  HGB 14.6 11.9* 12.6*  HCT 46.8 38.2* 39.8  PLT 158 120* 127*   Coag's  Recent Labs Lab 12/05/13 0236  INR 1.00   BMET  Recent Labs Lab 12/05/13 0236 12/07/13 0620 12/08/13 0310  NA 143 142 136*  K 4.1 4.0 4.0  CL 106 107 101  CO2 25 22 24   BUN 25* 16 17  CREATININE  1.36* 1.35 1.20  GLUCOSE 103* 100* 92   Electrolytes  Recent Labs Lab 12/05/13 0236 12/07/13 0620 12/08/13 0310  CALCIUM 9.0 7.7* 8.4  MG  --  1.8 1.9  PHOS  --  2.7 2.6   Sepsis Markers No results found for this basename: LATICACIDVEN, PROCALCITON, O2SATVEN,  in the last 168 hours ABG No results found for this basename: PHART, PCO2ART, PO2ART,  in the last 168 hours Liver Enzymes No results found for this basename: AST, ALT, ALKPHOS, BILITOT, ALBUMIN,  in the last 168 hours Cardiac Enzymes No results found for this basename: TROPONINI, PROBNP,  in the last 168 hours Glucose  Recent Labs Lab 12/04/13 2123  GLUCAP 93   Imaging Ct Head Wo Contrast  12/08/2013   CLINICAL DATA:  Evaluate subdural hematoma post evacuation.  EXAM: CT HEAD WITHOUT CONTRAST  TECHNIQUE: Contiguous axial images were obtained from the base of the skull through the vertex without intravenous contrast.  COMPARISON:  12/04/2013 and 11/27/2011  FINDINGS: Examination demonstrates a metallic density within the subcutaneous fat of the left temporal region as well as over the occipital region unchanged. Skin staples are present over the left temporoparietal region as patient has undergone interval evacuation of the left convexity subdural hematoma. There is a subdural drain entering the left frontal skull with tip over the left occipital region. A second subdural drain enters the left posterior parietal  skull with tip over the left temporoparietal region. There has been a significant reduction in size of the left convexity subdural hematoma post evacuation. The small residual left convexity subdural hematoma with subdural air remains. The subdural collection measures approximately 1.1 cm in thickness over the left frontal region and 9 mm over the left posterior parietal region. There is less midline shift to the right which now measures approximately 7-8 mm (previously 17 mm). There is continued mild mass effect  throughout the left hemisphere. There are several focal collections of acute extra-axial hemorrhage along the right parafalcine region likely subarachnoid in location with the largest measuring 8 x 13 mm. A slight worsening of a subdural collection along the posterior left parafalcine region measuring 6 mm in thickness which extends down along the left tentorium. The remainder the exam is unchanged.  IMPRESSION: Moderate decrease in size patient's left subdural hematoma post evacuation with residual subdural hemorrhage and air present. The subdural collection now measures approximately 1.1 cm in thickness over the frontal region and 9 mm over the posterior left parietal region. Continued mass effect on the left hemisphere with less midline shift to the right of 7-8 mm. Two left-sided subdural drains in place as described.  New foci of acute extra-axial hemorrhage along the right parafalcine region with the largest measuring 8 x 13 mm likely subarachnoid in location.  Slight worsening of an acute left parafalcine subdural collection measuring 6 mm in thickness extending to the left tentorium.  These results were called by telephone at the time of interpretation on 12/08/2013 at 7:31 am to Dr. Pam Drown, who verbally acknowledged these results.   Electronically Signed   By: Marin Olp M.D.   On: 12/08/2013 07:32    ASSESSMENT / PLAN:  PULMONARY A: No acute issues  P:   Monitor off O2, RA. Passed swallow evaluation, continue diet  CARDIOVASCULAR A: HTN  H/o CHF (echo 2013 LVEF 50-55%)  P:  SBP goal < 160 mm/Hg Followed by cardiology Burt Knack) as outpt Continue preadmission coreg Hold preadmission atorvastatin for now  RENAL A:   CKD creat at baseline 1.5, oliguria Mild hypokalemia  P:   KVO IVF. Hold lasix Follow BMP Replace electrolytes as indicated  GASTROINTESTINAL A:   No acute issues  P:   Swallow evaluation today passed, dysphagia 3 diet. SUP: IV protonix.  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  Rush Farmer, M.D. [facility] Pulmonary/Critical Care Medicine. Pager: [phone]. After hours pager: [phone].

## 2013-12-09 NOTE — Evaluation (Signed)
Physical Therapy Evaluation Patient Details Name: Kevin [name] MRN: [id number] DOB: [birth year] Today's Date: 12/09/2013   History of Present Illness   73 year old male presented to [facility] with AMS x 2-3 days. He has recently had falls as and takes xarelto. Head CT showed subdural hematoma on Left with resultant midline shift of 2cm.  S/p evacuation 12/05/13.  Clinical Impression  Pt adm due to above. Presents with deficits indicated below (see PT problem list). Pt to benefit from skilled acute PT to address deficits and maximize functional mobility prior to D/C to next appropriate venue. Per pt; PTA pt was living with family and ambulatory minimal distances with Lt prosthesis. Pt has wheelchair for primary source of mobility and was independent for all ADLs. Will recommend CIR for post acute rehab at this time to maximize independence and decr caregiver burden.     Follow Up Recommendations CIR    Equipment Recommendations  Other (comment) (TBD)    Recommendations for Other Services Rehab consult;OT consult     Precautions / Restrictions Precautions Precautions: Fall Precaution Comments: Pt with Lt AKA; wears prosthesis, not in room Required Braces or Orthoses: Other Brace/Splint Other Brace/Splint: prosthesis Restrictions Weight Bearing Restrictions: No      Mobility  Bed Mobility Overal bed mobility: +2 for physical assistance;Needs Assistance Bed Mobility: Supine to Sit;Rolling Rolling: Max assist   Supine to sit: +2 for physical assistance;Mod assist;HOB elevated     General bed mobility comments: pt reaching for handrail to (A) with bed mobility; 2 person (A) to bring hips, primarily Lt hip to EOB and elevate trunk; pt demo poor trunk control/weakness with EOB activities  Transfers                 General transfer comment: not tolerating EOB activities; transfers not assessed  Ambulation/Gait                Stairs            Wheelchair  Mobility    Modified Rankin (Stroke Patients Only) Modified Rankin (Stroke Patients Only) Pre-Morbid Rankin Score: Moderate disability Modified Rankin: Severe disability     Balance Overall balance assessment: Needs assistance;History of Falls Sitting-balance support: Bilateral upper extremity supported (Rt LE on floor ) Sitting balance-Leahy Scale: Zero Sitting balance - Comments: required max (A) to maintain balance; pt with poor trunk and unable to maintain sitting position at EOB; pt c/o dizziness ; heavy lean to Rt  Postural control: Posterior lean;Right lateral lean                                   Pertinent Vitals/Pain Pain Assessment: No/denies pain    Home Living Family/patient expects to be discharged to:: Private residence Living Arrangements: Children Available Help at Discharge: Family;Available 24 hours/day Type of Home: Mobile home Home Access: Stairs to enter Entrance Stairs-Rails: None Entrance Stairs-Number of Steps: 4 Home Layout: One level Home Equipment: Walker - 2 wheels;Wheelchair - manual;Bedside commode Additional Comments: no family present to confirm history    Prior Function Level of Independence: Independent with assistive device(s)         Comments: pt stated "i wear my leg sometimes and sometimes im just in my chair"; no family present to confirm      Hand Dominance   Dominant Hand: Right    Extremity/Trunk Assessment   Upper Extremity Assessment: Defer to OT evaluation  Lower Extremity Assessment: RLE deficits/detail (Lt LE AKA) RLE Deficits / Details: Rt quad 3+/5; hip 2+/5    Cervical / Trunk Assessment: Kyphotic  Communication   Communication: No difficulties  Cognition Arousal/Alertness: Awake/alert Behavior During Therapy: Flat affect Overall Cognitive Status: No family/caregiver present to determine baseline cognitive functioning Area of Impairment: Problem  solving;Awareness;Attention;Following commands   Current Attention Level: Selective   Following Commands: Follows one step commands with increased time   Awareness: Emergent Problem Solving: Slow processing;Requires tactile cues;Requires verbal cues;Difficulty sequencing;Decreased initiation General Comments: no family present to determine baseline    General Comments General comments (skin integrity, edema, etc.): pt with difficulty scanning room during visual assessment; due to visual deficits vs attention deficits ?     Exercises        Assessment/Plan    PT Assessment Patient needs continued PT services  PT Diagnosis Difficulty walking;Generalized weakness;Altered mental status   PT Problem List Decreased strength;Decreased range of motion;Decreased activity tolerance;Decreased balance;Decreased mobility;Decreased cognition;Decreased knowledge of use of DME;Decreased safety awareness  PT Treatment Interventions DME instruction;Gait training;Functional mobility training;Therapeutic activities;Therapeutic exercise;Balance training;Neuromuscular re-education;Patient/family education;Cognitive remediation;Wheelchair mobility training   PT Goals (Current goals can be found in the Care Plan section) Acute Rehab PT Goals Patient Stated Goal: to go home with my family PT Goal Formulation: With patient Time For Goal Achievement: 12/16/13 Potential to Achieve Goals: Fair    Frequency Min 4X/week   Barriers to discharge        Co-evaluation               End of Session   Activity Tolerance: Patient limited by fatigue Patient left: in bed;with call bell/phone within reach;with bed alarm set Nurse Communication: Mobility status;Other (comment) (pt with soiled bed)         Time: 4098-1191 PT Time Calculation (min): 18 min   Charges:   PT Evaluation $Initial PT Evaluation Tier I: 1 Procedure PT Treatments $Therapeutic Activity: 8-22 mins   PT G CodesGustavus Bryant, [place]  [phone] 12/09/2013, 2:46 PM

## 2013-12-10 ENCOUNTER — Inpatient Hospital Stay (HOSPITAL_COMMUNITY): Payer: Medicare Other

## 2013-12-10 LAB — BASIC METABOLIC PANEL
ANION GAP: 11 (ref 5–15)
BUN: 18 mg/dL (ref 6–23)
CO2: 27 meq/L (ref 19–32)
Calcium: 8.6 mg/dL (ref 8.4–10.5)
Chloride: 97 mEq/L (ref 96–112)
Creatinine, Ser: 1.14 mg/dL (ref 0.50–1.35)
GFR calc Af Amer: 72 mL/min — ABNORMAL LOW (ref >90)
GFR calc non Af Amer: 62 mL/min — ABNORMAL LOW (ref >90)
Glucose, Bld: 84 mg/dL (ref 70–99)
Potassium: 4.3 mEq/L (ref 3.7–5.3)
Sodium: 135 mEq/L — ABNORMAL LOW (ref 137–147)

## 2013-12-10 LAB — CBC
HCT: 42.9 % (ref 39.0–52.0)
Hemoglobin: 13.7 g/dL (ref 13.0–17.0)
MCH: 26.9 pg (ref 26.0–34.0)
MCHC: 31.9 g/dL (ref 30.0–36.0)
MCV: 84.3 fL (ref 78.0–100.0)
Platelets: 141 10*3/uL — ABNORMAL LOW (ref 150–400)
RBC: 5.09 MIL/uL (ref 4.22–5.81)
RDW: 16.4 % — ABNORMAL HIGH (ref 11.5–15.5)
WBC: 5.4 10*3/uL (ref 4.0–10.5)

## 2013-12-10 LAB — MAGNESIUM: Magnesium: 1.9 mg/dL (ref 1.5–2.5)

## 2013-12-10 LAB — PHOSPHORUS: Phosphorus: 3.3 mg/dL (ref 2.3–4.6)

## 2013-12-10 NOTE — Progress Notes (Signed)
Speech Language Pathology Treatment: Dysphagia  Patient Details Name: Kevin [name] MRN: [id number] DOB: 04/19/1940 Today's Date: 12/10/2013 Time: 6837-2902 SLP Time Calculation (min): 20 min  Assessment / Plan / Recommendation Clinical Impression  SLP observed pt with upgraded solid textures with no pocketing or residuals noted. Pt likely more alert this session and now appropriate for diet upgrade to Regular solids. Given missing dentition offered option of keeping soft diet, but but requested upgrade. SLP also provided moderate verbal cues and reinforcement for small sips given occasional strong cough with large consecutive straw sips. Pt return demonstrated. Will f/u for tolerance of upgraded diet.    HPI HPI: 73 year old male presented to [facility] 12/04/13 with AMS x 2-3 days. He has recently had falls and takes xarelto. Head CT showed subdural hematoma on Left with resultant midline shift of 2cm. Pt underwent evacuation on 9/23.   Pertinent Vitals Pain Assessment: No/denies pain  SLP Plan  Continue with current plan of care    Recommendations Diet recommendations: Regular;Thin liquid Liquids provided via: Cup;Straw Medication Administration: Whole meds with puree Supervision: Patient able to self feed;Intermittent supervision to cue for compensatory strategies Compensations: Slow rate;Small sips/bites;Check for pocketing Postural Changes and/or Swallow Maneuvers: Seated upright 90 degrees              General recommendations: Rehab consult Oral Care Recommendations: Oral care BID Follow up Recommendations: Inpatient Rehab Plan: Continue with current plan of care    GO    [facility], MA CCC-SLP [phone]  Kevin [name] 12/10/2013, 11:28 AM

## 2013-12-10 NOTE — Progress Notes (Signed)
Physical Therapy Treatment Patient Details Name: Kevin [name] MRN: [id number] DOB: Nov 18, 1940 Today's Date: 12/10/2013    History of Present Illness  73 year old male presented to [facility] with AMS x 2-3 days. He has recently had falls as and takes xarelto. Head CT showed subdural hematoma on Left with resultant midline shift of 2cm.  S/p evacuation 12/05/13.    PT Comments    Pt with improved participation in bed mobility, however continues to be limited by cognitive deficits.  Still feel pt would benefit from CIR to maximize independence and decrease burden of care prior to returning home with family.    Follow Up Recommendations  CIR     Equipment Recommendations   (TBD)    Recommendations for Other Services Rehab consult;OT consult     Precautions / Restrictions Precautions Precautions: Fall Precaution Comments: Pt with Lt AKA; wears prosthesis, not in room Restrictions Weight Bearing Restrictions: No    Mobility  Bed Mobility Overal bed mobility: Needs Assistance;+2 for physical assistance Bed Mobility: Supine to Sit;Sit to Supine     Supine to sit: Mod assist;+2 for physical assistance Sit to supine: Mod assist;+2 for physical assistance   General bed mobility comments: pt attempts to A with mobility grabbing at bed rails to bring trunk up to sitting.  cues for sequencing and technique.    Transfers                 General transfer comment: pt declined to attempt.    Ambulation/Gait                 Stairs            Wheelchair Mobility    Modified Rankin (Stroke Patients Only) Modified Rankin (Stroke Patients Only) Pre-Morbid Rankin Score: Moderate disability Modified Rankin: Severe disability     Balance Overall balance assessment: Needs assistance Sitting-balance support: Bilateral upper extremity supported;Feet supported Sitting balance-Leahy Scale: Poor Sitting balance - Comments: pt needs MinA and uses Bil UEs to maintain  balance.   Postural control: Posterior lean                          Cognition Arousal/Alertness: Awake/alert Behavior During Therapy: Flat affect Overall Cognitive Status: No family/caregiver present to determine baseline cognitive functioning Area of Impairment: Attention;Memory;Orientation;Following commands;Safety/judgement;Awareness;Problem solving Orientation Level: Disoriented to;Situation (pt states MDs "Did something bad to me".) Current Attention Level: Sustained Memory: Decreased recall of precautions;Decreased short-term memory Following Commands: Follows one step commands with increased time;Follows multi-step commands inconsistently Safety/Judgement: Decreased awareness of safety;Decreased awareness of deficits Awareness: Emergent Problem Solving: Slow processing;Difficulty sequencing;Requires verbal cues;Requires tactile cues;Decreased initiation General Comments: Continues to be no family present to help determine baseline level of cognition.      Exercises      General Comments        Pertinent Vitals/Pain Pain Assessment: No/denies pain    Home Living                      Prior Function            PT Goals (current goals can now be found in the care plan section) Acute Rehab PT Goals Patient Stated Goal: to go home with my family PT Goal Formulation: With patient Time For Goal Achievement: 12/16/13 Potential to Achieve Goals: Fair Progress towards PT goals: Progressing toward goals    Frequency  Min 4X/week    PT Plan  Current plan remains appropriate    Co-evaluation             End of Session   Activity Tolerance: Patient limited by fatigue Patient left: in bed;with call bell/phone within reach;with bed alarm set     Time: 1101-1117 PT Time Calculation (min): 16 min  Charges:  $Therapeutic Activity: 8-22 mins                    G CodesCatarina Hartshorn, [place] 12/10/2013, 12:17 PM

## 2013-12-10 NOTE — Progress Notes (Signed)
UR completed.  Therapies recommending CIR at d/c. Pt needs consult to CIR to proceed.  Sandi Mariscal, RN BSN [place] CCM Trauma/Neuro ICU Case Manager [phone]

## 2013-12-10 NOTE — Progress Notes (Signed)
Rehab Admissions Coordinator Note:  Patient was screened by Retta Diones for appropriateness for an Inpatient Acute Rehab Consult.  At this time, we are recommending Inpatient Rehab consult.  Retta Diones 12/10/2013, 9:56 AM  I can be reached at [phone].

## 2013-12-11 DIAGNOSIS — W19XXXA Unspecified fall, initial encounter: Secondary | ICD-10-CM

## 2013-12-11 DIAGNOSIS — I82A19 Acute embolism and thrombosis of unspecified axillary vein: Secondary | ICD-10-CM

## 2013-12-11 DIAGNOSIS — S065X9A Traumatic subdural hemorrhage with loss of consciousness of unspecified duration, initial encounter: Secondary | ICD-10-CM

## 2013-12-11 DIAGNOSIS — S065XAA Traumatic subdural hemorrhage with loss of consciousness status unknown, initial encounter: Secondary | ICD-10-CM

## 2013-12-11 LAB — PHOSPHORUS: Phosphorus: 3.7 mg/dL (ref 2.3–4.6)

## 2013-12-11 LAB — TROPONIN I
Troponin I: <0.3 ng/mL (ref <0.30)
Troponin I: <0.3 ng/mL (ref <0.30)

## 2013-12-11 LAB — MAGNESIUM: MAGNESIUM: 2.1 mg/dL (ref 1.5–2.5)

## 2013-12-11 MED ORDER — MAGNESIUM SULFATE 40 MG/ML IJ SOLN
2.0000 g | Freq: Once | INTRAMUSCULAR | Status: AC
  Administered 2013-12-11: 2 g via INTRAVENOUS
  Filled 2013-12-11: qty 50

## 2013-12-11 NOTE — Progress Notes (Signed)
Speech Language Pathology Treatment: Dysphagia  Patient Details Name: Kevin [name] MRN: [id number] DOB: 09/10/1940 Today's Date: 12/11/2013 Time: 8682-5749 SLP Time Calculation (min): 15 min  Assessment / Plan / Recommendation Clinical Impression  Pt continues to tolerate regular texture diet and thin liquids if following basic precautions. Pt was able to verbalize and return demonstrate single sip strategy with 80% accuracy under supervision. Pt does have a congested cough that appears unrelated to PO intake. Dysphagia goals met, pt able to continue diet with intermittent supervision. Would benefit from cognitive linguistic eval if admitted to CIR.    HPI HPI: 73 year old male presented to [facility] 12/04/13 with AMS x 2-3 days. He has recently had falls and takes xarelto. Head CT showed subdural hematoma on Left with resultant midline shift of 2cm. Pt underwent evacuation on 9/23.   Pertinent Vitals Pain Assessment: No/denies pain  SLP Plan  All goals met    Recommendations Diet recommendations: Regular;Thin liquid Liquids provided via: Cup;Straw Medication Administration: Whole meds with puree Supervision: Patient able to self feed;Intermittent supervision to cue for compensatory strategies Compensations: Slow rate;Small sips/bites;Check for pocketing Postural Changes and/or Swallow Maneuvers: Seated upright 90 degrees              General recommendations: Rehab consult Oral Care Recommendations: Oral care BID Follow up Recommendations: Inpatient Rehab Plan: All goals met    GO    Herbie [place], MA CCC-SLP [phone]  [name] 12/11/2013, 10:08 AM

## 2013-12-11 NOTE — Progress Notes (Addendum)
PULMONARY / CRITICAL CARE MEDICINE   Name: Kevin [name] MRN: [id number] DOB: 02/18/1941    ADMISSION DATE:  12/04/2013 CONSULTATION DATE:  12/04/2013  REFERRING MD :  Hal Neer  CHIEF COMPLAINT:  AMS  INITIAL PRESENTATION: 73 year old male presented to [facility] with AMS x 2-3 days. He has recently had falls as and takes xarelto. Head CT showed subdural hematoma on Left with resultant midline shift of 2cm. He was admitted by neurosurgery.  PCCM asked to assist with medical management.   STUDIES:  9/22 CT Head: Large left subdural hematoma with considerable mass-effect   SIGNIFICANT EVENTS: 9/22: Admitted for SDH.  9/23 surgical evacuation  SUBJECTIVE: Patient is in bigemini this AM.  No CP.  VITAL SIGNS: Temp:  [97.3 F (36.3 C)-98.2 F (36.8 C)] 97.3 F (36.3 C) (09/29 0800) Pulse Rate:  [34-60] 35 (09/29 0900) Resp:  [12-18] 14 (09/29 0900) BP: (98-157)/(58-98) 124/72 mmHg (09/29 0900) SpO2:  [95 %-100 %] 98 % (09/29 0900)  HEMODYNAMICS:   VENTILATOR SETTINGS:   INTAKE / OUTPUT:  Intake/Output Summary (Last 24 hours) at 12/11/13 1253 Last data filed at 12/11/13 0800  Gross per 24 hour  Intake    525 ml  Output   1725 ml  Net  -1200 ml   PHYSICAL EXAMINATION: General:  Overweight male in NAD on RA Neuro:  Alert, oriented, moderately confused. HEENT:  [place]/AT, PERRL, No JVD noted Cardiovascular:  RRR Lungs: Clear anterior lung sounds Abdomen:  Soft, non-tender, non-distended Musculoskeletal:  Remote LLE AKA. Otherwise without deformity.  Skin:  Intact  LABS:  CBC  Recent Labs Lab 12/07/13 0620 12/08/13 0310 12/10/13 0258  WBC 5.2 5.3 5.4  HGB 11.9* 12.6* 13.7  HCT 38.2* 39.8 42.9  PLT 120* 127* 141*   Coag's  Recent Labs Lab 12/05/13 0236  INR 1.00   BMET  Recent Labs Lab 12/07/13 0620 12/08/13 0310 12/10/13 0258  NA 142 136* 135*  K 4.0 4.0 4.3  CL 107 101 97  CO2 22 24 27   BUN 16 17 18   CREATININE 1.35 1.20 1.14  GLUCOSE 100* 92 84    Electrolytes  Recent Labs Lab 12/07/13 0620 12/08/13 0310 12/10/13 0258  CALCIUM 7.7* 8.4 8.6  MG 1.8 1.9 1.9  PHOS 2.7 2.6 3.3   Sepsis Markers No results found for this basename: LATICACIDVEN, PROCALCITON, O2SATVEN,  in the last 168 hours ABG No results found for this basename: PHART, PCO2ART, PO2ART,  in the last 168 hours Liver Enzymes No results found for this basename: AST, ALT, ALKPHOS, BILITOT, ALBUMIN,  in the last 168 hours Cardiac Enzymes No results found for this basename: TROPONINI, PROBNP,  in the last 168 hours Glucose  Recent Labs Lab 12/04/13 2123  GLUCAP 93   Imaging Ct Head Wo Contrast  12/10/2013   CLINICAL DATA:  Followup subdural hematoma  EXAM: CT HEAD WITHOUT CONTRAST  TECHNIQUE: Contiguous axial images were obtained from the base of the skull through the vertex without intravenous contrast.  COMPARISON:  Prior CT from 12/08/2013  FINDINGS: Scalp soft tissue swelling with emphysema and overlying skin staples again seen within the left frontotemporal scalp. Two evacuation burr holes traverse the left calvarium. Previously seen subdural drain has been removed. Pneumocephalus within the left extra-axial space is slightly increased, which may related to subdural drain removal.  The acute on chronic left hematoma is overall not significantly changed in size measuring 11.8 mm over the left frontal lobe and 9.7 mm over the left parietal  lobe. Associated left-to-right midline shift of 8 mm is similar. Hemorrhage again seen extending along the falx and left tentorium. Right parafalcine hemorrhage measures 12.8 x 8.3 mm, not significantly changed. Scattered foci of hemorrhage more anteriorly within the paramedian right frontal lobe are also unchanged. No new intracranial hemorrhage.  There is an evolving parenchymal contusion within the peripheral left frontotemporal region measuring 1 cm in diameter (series 2, image 15). Small amount of localized vasogenic edema.  No  hydrocephalus.  Basilar cisterns remain patent.  No acute large vessel territory infarct.  No acute abnormality seen about the orbits.  Paranasal sinuses and mastoid air cells are clear.  IMPRESSION: 1. Interval removal of left-sided subdural drains, with no significant interval change in size of left subdural hematoma. 2. 1 cm evolving left frontotemporal parenchymal contusion. This has not been previously described, but is unchanged relative to prior studies. 3. Persistent 8 mm of left-to-right midline shift, stable. No hydrocephalus. 4. Stable right parafalcine hemorrhages. 5. No new intracranial hemorrhage or other intracranial process.   Electronically Signed   By: Jeannine Boga M.D.   On: 12/10/2013 06:06    ASSESSMENT / PLAN:  PULMONARY A: No acute issues  P:   Monitor off O2, RA. Passed swallow evaluation, continue diet  CARDIOVASCULAR A: HTN  H/o CHF (echo 2013 LVEF 50-55%) Bigemini  P:  SBP goal < 160 mm/Hg Followed by cardiology Burt Knack) as outpt Continue preadmission coreg Hold preadmission atorvastatin for now Will check troponins and replace electrolytes Would consult with cards regarding bigemini if persists  RENAL A:   CKD creat at baseline 1.5, oliguria Mild hypokalemia  P:   KVO IVF. Hold lasix Follow BMP Replace electrolytes as indicated  GASTROINTESTINAL A:   No acute issues  P:   Swallow evaluation passed, dysphagia 3 diet. SUP: IV protonix.  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  Rush Farmer, M.D. [facility] Pulmonary/Critical Care Medicine. Pager: [phone]. After hours pager: [phone].

## 2013-12-11 NOTE — Progress Notes (Signed)
Physical Therapy Treatment Patient Details Name: Kevin [name] MRN: [id number] DOB: 1941-03-13 Today's Date: 12/11/2013    History of Present Illness  73 year old male presented to [facility] with AMS x 2-3 days. He has recently had falls as and takes xarelto. Head CT showed subdural hematoma on Left with resultant midline shift of 2cm.  S/p evacuation 12/05/13.    PT Comments    Patient demonstrates some improvements in EOB sitting balance but continues to demonstrates significant mobility deficits.  Assisted patient OOB to chair today, will continue to see and progress as tolerated.  Follow Up Recommendations  CIR     Equipment Recommendations   (TBD)    Recommendations for Other Services Rehab consult;OT consult     Precautions / Restrictions Precautions Precautions: Fall Precaution Comments: Pt with Lt AKA; wears prosthesis, not in room Required Braces or Orthoses: Other Brace/Splint Other Brace/Splint: prosthesis Restrictions Weight Bearing Restrictions: No    Mobility  Bed Mobility Overal bed mobility: Needs Assistance;+2 for physical assistance Bed Mobility: Supine to Sit Rolling: Mod assist   Supine to sit: Mod assist;+2 for physical assistance     General bed mobility comments: VCs for positioing, assist to reach for rail, assist to come to upright.   Transfers Overall transfer level: Needs assistance Equipment used: None (+2 face to face with gait belt and chuck pad) Transfers: Squat Pivot Transfers     Squat pivot transfers: Total assist;+2 physical assistance;+2 safety/equipment        Ambulation/Gait                 Stairs            Wheelchair Mobility    Modified Rankin (Stroke Patients Only)       Balance Overall balance assessment: Needs assistance Sitting-balance support: Bilateral upper extremity supported (Rt LE on floor ) Sitting balance-Leahy Scale: Poor Sitting balance - Comments: pt needs MinA and uses Bil UEs to  maintain balance.   Postural control: Posterior lean                          Cognition Arousal/Alertness: Awake/alert Behavior During Therapy: Flat affect Overall Cognitive Status: No family/caregiver present to determine baseline cognitive functioning Area of Impairment: Problem solving;Awareness;Attention;Following commands   Current Attention Level: Sustained;Selective   Following Commands: Follows one step commands with increased time   Awareness: Emergent Problem Solving: Slow processing;Requires tactile cues;Requires verbal cues;Difficulty sequencing;Decreased initiation      Exercises      General Comments General comments (skin integrity, edema, etc.): trunk control activities performed EOB, BP assessed in supine and sitting      Pertinent Vitals/Pain Pain Assessment: No/denies pain    Home Living                      Prior Function            PT Goals (current goals can now be found in the care plan section) Acute Rehab PT Goals Patient Stated Goal: to go home with my family PT Goal Formulation: With patient Time For Goal Achievement: 12/16/13 Potential to Achieve Goals: Fair Progress towards PT goals: Progressing toward goals    Frequency  Min 4X/week    PT Plan Current plan remains appropriate    Co-evaluation             End of Session Equipment Utilized During Treatment: Gait belt Activity Tolerance: Patient limited by  fatigue Patient left: in chair;with call bell/phone within reach     Time: 1034-1051 PT Time Calculation (min): 17 min  Charges:  $Therapeutic Activity: 8-22 mins                    G CodesDuncan Dull 12-19-2013, 11:11 AM Alben Deeds, PT DPT  [phone]

## 2013-12-11 NOTE — Progress Notes (Signed)
Patient ID: Kevin [name], male   DOB: 07-Dec-1940, 73 y.o.   MRN: [id number] Afeb, vss No new neuro issues. Wound clean and dry. Follow upCT head looks progressively better. OK for rehab anytime. Will consult.

## 2013-12-11 NOTE — Consult Note (Signed)
Physical Medicine and Rehabilitation Consult Reason for Consult: Left chronic subdural hematoma Referring Physician: Dr. Cyndy Freeze   HPI: Kevin [name] is a 73 y.o. right-handed male with history of hypertension, diastolic congestive heart failure, CAD with myocardial infarction maintained onXarelto and left above-knee amputation 2013 and received inpatient rehabilitation services and was discharged to home. Patient lives at home with family mostly stays in his wheelchair but does use his prosthesis at times. Presented 12/04/2013 with reported numerous falls from his wheelchair and reported altered mental status per his son. CT then showed a large isodense subdural on the left side of the 2 cm midline shift. Underwent burr hole for evacuation of hematoma 12/05/2013 per Dr. Cyndy Freeze. Placed on Keppra for seizure prophylaxis. Tolerating a regular consistency diet. Follow cranial CT scan 12/10/2013 to be stable no hydrocephalus. Physical therapy evaluation completed 12/09/2013 with recommendations for physical medicine rehabilitation consult.  Review of Systems  Cardiovascular: Positive for leg swelling.  Genitourinary: Positive for urgency.  Musculoskeletal: Positive for falls.  Psychiatric/Behavioral: Positive for memory loss.  All other systems reviewed and are negative.  Past Medical History  Diagnosis Date  . Arthritis   . Hypertension   . CHF (congestive heart failure)     a. NICM/acute CHF 2007 with EF 10-20%. b. Improved EF 50-55% by echo 12/2011.  Marland Kitchen Hyperlipidemia   . Coronary artery disease     a. NSTEMI 2007 with cath showing mild nonobstructive CAD by cath 2007 (40% mLAD, 40%mRCA) at time of dx of NICM.  Marland Kitchen Tortuous aorta     a. Noted by cath 2007, recommendation for OP [place].  Marland Kitchen BPH (benign prostatic hyperplasia)   . Wide-complex tachycardia     a. Noted 2007.  . Obesity   . Valvular heart disease     a. Mod MR by echo 06/2005 but no significant MR 2013.   Past Surgical  History  Procedure Laterality Date  . No past surgeries    . Vena cava filter placement  01/03/2012    Procedure: INSERTION VENA-CAVA FILTER;  Surgeon: Rosetta Posner, MD;  Location: [place];  Service: Vascular;  Laterality: Left;  . Amputation  01/05/2012    Procedure: AMPUTATION ABOVE KNEE;  Surgeon: Rosetta Posner, MD;  Location: [place];  Service: Vascular;  Laterality: Left;  . Craniotomy Left 12/05/2013    Procedure: CRANIOTOMY HEMATOMA EVACUATION SUBDURAL- LEFT;  Surgeon: Ashok Pall, MD;  Location: [place] [facility];  Service: Neurosurgery;  Laterality: Left;  CRANIOTOMY HEMATOMA EVACUATION SUBDURAL- LEFT   Family History  Problem Relation Age of Onset  . Heart attack Mother     In her 72's  . Stroke Mother     In her 60's  . CAD Brother    Social History:  reports that he has quit smoking. He uses smokeless tobacco. He reports that he does not drink alcohol or use illicit drugs. Allergies: No Known Allergies Medications Prior to Admission  Medication Sig Dispense Refill  . atorvastatin (LIPITOR) 20 MG tablet Take 20 mg by mouth daily.      . carvedilol (COREG) 6.25 MG tablet Take 6.25 mg by mouth 2 (two) times daily with a meal.      . diazepam (VALIUM) 5 MG tablet Take 5 mg by mouth every 6 (six) hours as needed for anxiety.       . furosemide (LASIX) 40 MG tablet Take 40 mg by mouth daily.       . tamsulosin (FLOMAX)  0.4 MG CAPS Take 0.4 mg by mouth daily.       Alveda Reasons 20 MG TABS Take 20 mg by mouth daily.         Home: Home Living Family/patient expects to be discharged to:: Private residence Living Arrangements: Children Available Help at Discharge: Family;Available 24 hours/day Type of Home: Mobile home Home Access: Stairs to enter Entrance Stairs-Number of Steps: 4 Entrance Stairs-Rails: None Home Layout: One level Home Equipment: Walker - 2 wheels;Wheelchair - manual;Bedside commode Additional Comments: no family present to confirm history  Functional History: Prior  Function Level of Independence: Independent with assistive device(s) Comments: pt stated "i wear my leg sometimes and sometimes im just in my chair"; no family present to confirm  Functional Status:  Mobility: Bed Mobility Overal bed mobility: Needs Assistance;+2 for physical assistance Bed Mobility: Supine to Sit;Sit to Supine Rolling: Max assist Supine to sit: Mod assist;+2 for physical assistance Sit to supine: Mod assist;+2 for physical assistance General bed mobility comments: pt attempts to A with mobility grabbing at bed rails to bring trunk up to sitting.  cues for sequencing and technique.   Transfers General transfer comment: pt declined to attempt.        ADL:    Cognition: Cognition Overall Cognitive Status: No family/caregiver present to determine baseline cognitive functioning Orientation Level: Oriented to person;Oriented to place;Oriented to situation Cognition Arousal/Alertness: Awake/alert Behavior During Therapy: Flat affect Overall Cognitive Status: No family/caregiver present to determine baseline cognitive functioning Area of Impairment: Attention;Memory;Orientation;Following commands;Safety/judgement;Awareness;Problem solving Orientation Level: Disoriented to;Situation (pt states MDs "Did something bad to me".) Current Attention Level: Sustained Memory: Decreased recall of precautions;Decreased short-term memory Following Commands: Follows one step commands with increased time;Follows multi-step commands inconsistently Safety/Judgement: Decreased awareness of safety;Decreased awareness of deficits Awareness: Emergent Problem Solving: Slow processing;Difficulty sequencing;Requires verbal cues;Requires tactile cues;Decreased initiation General Comments: Continues to be no family present to help determine baseline level of cognition.    Blood pressure 124/72, pulse 35, temperature 97.3 F (36.3 C), temperature source Oral, resp. rate 14, height 5\' 7"  (1.702  m), weight 72.576 kg (160 lb), SpO2 98.00%. Physical Exam  HENT:  Burr hole craniotomy site with staples intact.  Eyes:  Pupils reactive to light without nystagmus  Neck: Neck supple. No thyromegaly present.  Cardiovascular: Regular rhythm.   Respiratory: Effort normal and breath sounds normal. No respiratory distress.  GI: Soft. Bowel sounds are normal. He exhibits no distension.  Musculoskeletal:  Edematous left aka stump  Neurological:  Mood is flat but appropriate. He makes good eye contact with examiner. He was able to provide his name age and date of birth. Follows simple commands. RUE 3+ to 4/5. RLE: 2/5 HF, 3/5 KE and 4- ankle. Some delay in processing but generally appropriate.   Skin:  Left AKA site is well-healed    No results found for this or any previous visit (from the past 24 hour(s)). Ct Head Wo Contrast  12/10/2013   CLINICAL DATA:  Followup subdural hematoma  EXAM: CT HEAD WITHOUT CONTRAST  TECHNIQUE: Contiguous axial images were obtained from the base of the skull through the vertex without intravenous contrast.  COMPARISON:  Prior CT from 12/08/2013  FINDINGS: Scalp soft tissue swelling with emphysema and overlying skin staples again seen within the left frontotemporal scalp. Two evacuation burr holes traverse the left calvarium. Previously seen subdural drain has been removed. Pneumocephalus within the left extra-axial space is slightly increased, which may related to subdural drain removal.  The acute  on chronic left hematoma is overall not significantly changed in size measuring 11.8 mm over the left frontal lobe and 9.7 mm over the left parietal lobe. Associated left-to-right midline shift of 8 mm is similar. Hemorrhage again seen extending along the falx and left tentorium. Right parafalcine hemorrhage measures 12.8 x 8.3 mm, not significantly changed. Scattered foci of hemorrhage more anteriorly within the paramedian right frontal lobe are also unchanged. No new  intracranial hemorrhage.  There is an evolving parenchymal contusion within the peripheral left frontotemporal region measuring 1 cm in diameter (series 2, image 15). Small amount of localized vasogenic edema.  No hydrocephalus.  Basilar cisterns remain patent.  No acute large vessel territory infarct.  No acute abnormality seen about the orbits.  Paranasal sinuses and mastoid air cells are clear.  IMPRESSION: 1. Interval removal of left-sided subdural drains, with no significant interval change in size of left subdural hematoma. 2. 1 cm evolving left frontotemporal parenchymal contusion. This has not been previously described, but is unchanged relative to prior studies. 3. Persistent 8 mm of left-to-right midline shift, stable. No hydrocephalus. 4. Stable right parafalcine hemorrhages. 5. No new intracranial hemorrhage or other intracranial process.   Electronically Signed   By: Jeannine Boga M.D.   On: 12/10/2013 06:06    Assessment/Plan: Diagnosis: left SDH after fall 1. Does the need for close, 24 hr/day medical supervision in concert with the patient's rehab needs make it unreasonable for this patient to be served in a less intensive setting? Yes 2. Co-Morbidities requiring supervision/potential complications: hx of left AKA (w/c bound), CAD, CHF 3. Due to bladder management, bowel management, safety, skin/wound care, disease management, medication administration, pain management and patient education, does the patient require 24 hr/day rehab nursing? Yes 4. Does the patient require coordinated care of a physician, rehab nurse, PT (1-2 hrs/day, 5 days/week), OT (1-2 hrs/day, 5 days/week) and SLP (1-2 hrs/day, 5 days/week) to address physical and functional deficits in the context of the above medical diagnosis(es)? Yes Addressing deficits in the following areas: balance, endurance, locomotion, strength, transferring, bowel/bladder control, bathing, dressing, feeding, grooming, toileting,  cognition, speech and psychosocial support 5. Can the patient actively participate in an intensive therapy program of at least 3 hrs of therapy per day at least 5 days per week? Yes and Potentially 6. The potential for patient to make measurable gains while on inpatient rehab is good 7. Anticipated functional outcomes upon discharge from inpatient rehab are supervision and min assist  with PT, supervision and min assist with OT, supervision with SLP. 8. Estimated rehab length of stay to reach the above functional goals is: 14-20 days 9. Does the patient have adequate social supports to accommodate these discharge functional goals? Yes and Potentially 10. Anticipated D/C setting: Home 11. Anticipated post D/C treatments: HH therapy and Outpatient therapy 12. Overall Rehab/Functional Prognosis: excellent  RECOMMENDATIONS: This patient's condition is appropriate for continued rehabilitative care in the following setting: CIR Patient has agreed to participate in recommended program. Yes Note that insurance prior authorization may be required for reimbursement for recommended care.  Comment: Rehab Admissions Coordinator to follow up. Need to clarify the family's ability to assist patient after dc.   Thanks,  Meredith Staggers, MD, Mellody Drown     12/11/2013

## 2013-12-12 ENCOUNTER — Encounter (HOSPITAL_COMMUNITY): Payer: Self-pay | Admitting: Cardiovascular Disease

## 2013-12-12 DIAGNOSIS — I2510 Atherosclerotic heart disease of native coronary artery without angina pectoris: Secondary | ICD-10-CM

## 2013-12-12 DIAGNOSIS — I499 Cardiac arrhythmia, unspecified: Secondary | ICD-10-CM

## 2013-12-12 DIAGNOSIS — I498 Other specified cardiac arrhythmias: Secondary | ICD-10-CM

## 2013-12-12 LAB — TROPONIN I

## 2013-12-12 NOTE — Consult Note (Addendum)
Patient ID: KORBYN CHOPIN MRN: [id number] DOB/AGE: [birth year] 73 y.o.  Admit date: 12/04/2013 Primary Cardiologist: Burt Knack Reason for Consultation: Ventricular Bigeminy  HPI: 73 yo male with history of HTN, non-ischemic cardiomyopathy, HLD, CAD, DVT/PE admitted 12/04/13 to Neurosurgery service with neurological changes and found to have large isodense subdural hematoma on the left side with 2 cm of midline shift. He is on Xarelto chronically. He has been noted to have frequent falls at home. He has been followed in the hospital by Neurosurgery and PCCM. The hematoma was evacuated 12/05/13. He has done well since then. Noted to be in occasional ventricular bigeminy today. He has no complaints of chest pain, SOB or palpitations.   Past Medical History  Diagnosis Date  . Arthritis   . Hypertension   . CHF (congestive heart failure)     a. NICM/acute CHF 2007 with EF 10-20%. b. Improved EF 50-55% by echo 12/2011.  Marland Kitchen Hyperlipidemia   . Coronary artery disease     a. NSTEMI 2007 with cath showing mild nonobstructive CAD by cath 2007 (40% mLAD, 40%mRCA) at time of dx of NICM.  Marland Kitchen Tortuous aorta     a. Noted by cath 2007, recommendation for OP [place].  Marland Kitchen BPH (benign prostatic hyperplasia)   . Wide-complex tachycardia     a. Noted 2007.  . Obesity   . Valvular heart disease     a. Mod MR by echo 06/2005 but no significant MR 2013.    Family History  Problem Relation Age of Onset  . Heart attack Mother     In her 40's  . Stroke Mother     In her 31's  . CAD Brother     History   Social History  . Marital Status: Legally Separated    Spouse Name: N/A    Number of Children: N/A  . Years of Education: N/A   Occupational History  . Not on file.   Social History Main Topics  . Smoking status: Former Research scientist (life sciences)  . Smokeless tobacco: Current User  . Alcohol Use: No  . Drug Use: No  . Sexual Activity: Not on file   Other Topics Concern  . Not on file   Social History Narrative    . No narrative on file    Past Surgical History  Procedure Laterality Date  . No past surgeries    . Vena cava filter placement  01/03/2012    Procedure: INSERTION VENA-CAVA FILTER;  Surgeon: Rosetta Posner, MD;  Location: [place];  Service: Vascular;  Laterality: Left;  . Amputation  01/05/2012    Procedure: AMPUTATION ABOVE KNEE;  Surgeon: Rosetta Posner, MD;  Location: [place];  Service: Vascular;  Laterality: Left;  . Craniotomy Left 12/05/2013    Procedure: CRANIOTOMY HEMATOMA EVACUATION SUBDURAL- LEFT;  Surgeon: Ashok Pall, MD;  Location: [facility];  Service: Neurosurgery;  Laterality: Left;  CRANIOTOMY HEMATOMA EVACUATION SUBDURAL- LEFT    No Known Allergies  Prescriptions prior to admission  Medication Sig Dispense Refill  . atorvastatin (LIPITOR) 20 MG tablet Take 20 mg by mouth daily.      . carvedilol (COREG) 6.25 MG tablet Take 6.25 mg by mouth 2 (two) times daily with a meal.      . diazepam (VALIUM) 5 MG tablet Take 5 mg by mouth every 6 (six) hours as needed for anxiety.       . furosemide (LASIX) 40 MG tablet Take 40 mg by mouth daily.       Marland Kitchen  tamsulosin (FLOMAX) 0.4 MG CAPS Take 0.4 mg by mouth daily.       Alveda Reasons 20 MG TABS Take 20 mg by mouth daily.         Review of systems complete and found to be negative unless listed above   Physical Exam: Blood pressure 105/72, pulse 49, temperature 97.7 F (36.5 C), temperature source Oral, resp. rate 15, height 5\' 7"  (1.702 m), weight 160 lb (72.576 kg), SpO2 94.00%.    General: Well developed, well nourished, NAD  HEENT: OP clear, mucus membranes moist  SKIN: warm, dry. No rashes.  Neuro: No focal deficits  Musculoskeletal: Muscle strength 5/5 all ext  Psychiatric: Mood and affect normal  Neck: No JVD, no carotid bruits, no thyromegaly, no lymphadenopathy.  Lungs:Clear bilaterally, no wheezes, rhonci, crackles  Cardiovascular: Regular rate and rhythm with ectopy. No murmurs, gallops or rubs.  Abdomen:Soft. Bowel  sounds present. Non-tender.  Extremities: No lower extremity edema. Left lower ext amputation.   Labs:   Lab Results  Component Value Date   WBC 5.4 12/10/2013   HGB 13.7 12/10/2013   HCT 42.9 12/10/2013   MCV 84.3 12/10/2013   PLT 141* 12/10/2013    Recent Labs Lab 12/10/13 0258  NA 135*  K 4.3  CL 97  CO2 27  BUN 18  CREATININE 1.14  CALCIUM 8.6  GLUCOSE 84   Lab Results  Component Value Date        TROPONINI <0.30 12/12/2013      Tele: sinus, ventricular bigeminy  ASSESSMENT AND PLAN:   1. Ventricular bigeminy: Electrolytes are balanced. He is asymptomatic. Would continue beta blocker. Would not titrate beta blocker given his relatively low BP. LV function has been normal. This is not an ischemic event. Would continue with discharge planning. OK to d/c from cardiac perspective. He can f/u with Dr. Burt Knack in 3-4 weeks after discharge.   2. HTN: BP controlled  3. CAD: stable   Signed: Merlie Noga 12/12/2013, 5:54 PM

## 2013-12-12 NOTE — PMR Pre-admission (Signed)
PMR Admission Coordinator Pre-Admission Assessment  Patient: Kevin [name] is an 73 y.o., male MRN: [id number] DOB: [birth year] Height: 5\' 7"  (170.2 cm) Weight: 93 kg (205 lb 0.4 oz)              Insurance Information  PRIMARY:  Medicare A and B       Policy#: [id number]      Subscriber:  self Benefits:  Phone #: [place] online     Name:  Eff. Date: 09/12/2005     Deduct:  $1260      Out of Pocket Max:  none      Life Max:  none CIR:  100%       SNF:  100% first 20 days per Medicare guidelines Outpatient:  80%     Co-Pay: 20% Home Health:  100%       DME:   80%     Co-Pay:  20% Providers:  Pt. choice SECONDARY:  Medicaid of [place]       Policy#: [id number]      Subscriber:  self Benefits:  Phone #:   [phone]   Eff. Date:  10/14/11      Emergency Contact Information Contact Information   Name Relation Home Work Lyons Son  [phone]   Kizzie Fantasia [phone]  [phone]   Janeice Robinson Daughter   [phone]   Shanon Rosser Daughter   [phone]     Current Medical History  Patient Admitting Diagnosis: left SDH after fall  History of Present Illness: Kevin [name] is a 73 y.o. right-handed male with history of hypertension, diastolic congestive heart failure, CAD with myocardial infarction maintained onXarelto and left above-knee amputation 2013 and received inpatient rehabilitation services and was discharged to home  . Patient lives at home with family mostly stays in his wheelchair but does use his prosthesis at times. Presented 12/04/2013 with reported numerous falls from his wheelchair and reported altered mental status per his son. CT then showed a large isodense subdural on the left side of the 2 cm midline shift. Underwent burr hole for evacuation of hematoma 12/05/2013 per Dr. Cyndy Freeze. Placed on Keppra for seizure prophylaxis.   Tolerating a regular consistency diet. Follow cranial CT scan 12/10/2013 to be stable no  hydrocephalus.   Noted to be in occasional ventricular bigeminy 12/12/13. Cardiology consulted.He is asymptomatic. Cardiology recommended continue beta blocker. Would not titrate beta blocker given his relatively low BP. LV function has been normal. This is not an ischemic event. He can f/u with Dr. Burt Knack in 3-4 weeks after discharge.   Total: 7 [facility]    Past Medical History  Past Medical History  Diagnosis Date  . Arthritis   . Hypertension   . CHF (congestive heart failure)     a. NICM/acute CHF 2007 with EF 10-20%. b. Improved EF 50-55% by echo 12/2011.  Marland Kitchen Hyperlipidemia   . Coronary artery disease     a. NSTEMI 2007 with cath showing mild nonobstructive CAD by cath 2007 (40% mLAD, 40%mRCA) at time of dx of NICM.  Marland Kitchen Tortuous aorta     a. Noted by cath 2007, recommendation for OP [place].  Marland Kitchen BPH (benign prostatic hyperplasia)   . Wide-complex tachycardia     a. Noted 2007.  . Obesity   . Valvular heart disease     a. Mod MR by echo 06/2005 but no significant MR 2013.  Marland Kitchen DVT (deep venous thrombosis)   . Pulmonary embolism     Family History  family history includes CAD in his brother; Heart attack in his mother; Stroke in his mother.  Prior Rehab/Hospitalizations: son reports pt. Came to IP rehab following L AKA 2013, then subsequently had HHPT and OPPT   Current Medications  Current facility-administered medications:acetaminophen (TYLENOL) suppository 650 mg, 650 mg, Rectal, Q6H PRN, Faythe Ghee, MD;  acetaminophen (TYLENOL) tablet 650 mg, 650 mg, Oral, Q6H PRN, Faythe Ghee, MD;  bisacodyl (DULCOLAX) suppository 10 mg, 10 mg, Rectal, Daily PRN, Ashok Pall, MD;  carvedilol (COREG) tablet 6.25 mg, 6.25 mg, Oral, BID WC, Faythe Ghee, MD, 6.25 mg at 12/13/13 0945 hydrALAZINE (APRESOLINE) injection 10 mg, 10 mg, Intravenous, Q2H PRN, Colbert Coyer, MD, 10 mg at 12/06/13 0235;  HYDROcodone-acetaminophen (NORCO/VICODIN) 5-325 MG per tablet 1 tablet, 1 tablet, Oral, Q4H  PRN, Ashok Pall, MD, 1 tablet at 12/13/13 0805;  labetalol (NORMODYNE,TRANDATE) injection 10-40 mg, 10-40 mg, Intravenous, Q10 min PRN, Ashok Pall, MD, 20 mg at 12/05/13 2307 levETIRAcetam (KEPPRA) tablet 500 mg, 500 mg, Oral, BID, Ashok Pall, MD, 500 mg at 12/13/13 0946;  morphine 2 MG/ML injection 1-2 mg, 1-2 mg, Intravenous, Q2H PRN, Ashok Pall, MD, 1 mg at 12/12/13 2253;  naloxone [facility]) injection 0.08 mg, 0.08 mg, Intravenous, PRN, Ashok Pall, MD;  ondansetron [facility]) injection 4 mg, 4 mg, Intravenous, Q4H PRN, Ashok Pall, MD, 4 mg at 12/06/13 0117 ondansetron (ZOFRAN) tablet 4 mg, 4 mg, Oral, Q4H PRN, Ashok Pall, MD;  pantoprazole (PROTONIX) EC tablet 40 mg, 40 mg, Oral, QHS, Ashok Pall, MD, 40 mg at 12/12/13 2100;  polyethylene glycol (MIRALAX / GLYCOLAX) packet 17 g, 17 g, Oral, Daily PRN, Ashok Pall, MD;  promethazine (PHENERGAN) tablet 12.5-25 mg, 12.5-25 mg, Oral, Q4H PRN, Ashok Pall, MD senna (SENOKOT) tablet 8.6 mg, 1 tablet, Oral, BID, Ashok Pall, MD, 8.6 mg at 12/13/13 0946;  tamsulosin (FLOMAX) capsule 0.4 mg, 0.4 mg, Oral, Daily, Faythe Ghee, MD, 0.4 mg at 12/13/13 9563  Patients Current Diet: Cardiac Regular with thin liquids; whole meds with pureed  Precautions / Restrictions Precautions Precautions: Fall Precaution Comments: Pt with Lt AKA; wears prosthesis, not in room Other Brace/Splint: prosthesis Restrictions Weight Bearing Restrictions: No Other Position/Activity Restrictions: Son reports pt. stopped using prosthesis and began using wheelchair as primary mod of mobility due to pain in shoulders while using RW    Prior Activity Level Limited Community (1-2x/wk): Son reports pt. goes out with him 2-3 x/week for lunch, MD appointments and errands  Development worker, international aid / [place] Devices/Equipment: Wheelchair;Shower chair with back;Prosthesis;Transfer board;Brace (specify type);Sock aid Home Equipment: Walker - 2  wheels;Wheelchair - manual;Bedside commode  Prior Functional Level Prior Function Level of Independence: Independent with assistive device(s) Comments: pt stated "i wear my leg sometimes and sometimes im just in my chair"; no family present to confirm   Current Functional Level Cognition  Overall Cognitive Status: No family/caregiver present to determine baseline cognitive functioning Current Attention Level: Selective Orientation Level: Oriented X4 Following Commands: Follows multi-step commands with increased time Safety/Judgement: Decreased awareness of safety;Decreased awareness of deficits General Comments: pt continues to be unaware of deficits and level of A needed for mobility.  pt does follow all directionswhen given increase time.      Extremity Assessment (includes Sensation/Coordination)          ADLs       Mobility  Overal bed mobility: Needs Assistance;+2 for physical assistance Bed Mobility: Rolling;Sidelying to Sit Rolling: Min assist Sidelying to sit: Mod assist;+2  for physical assistance Supine to sit: Mod assist Sit to supine: Mod assist;+2 for physical assistance General bed mobility comments: cues for technique and A for bringing trunk up to sitting.      Transfers  Overall transfer level: Needs assistance Equipment used: 2 person hand held assist Transfers: Sit to/from Stand Sit to Stand: Max assist;+2 physical assistance Squat pivot transfers: Max assist;+2 physical assistance;+2 safety/equipment General transfer comment: Attempted to come to stand x2 with pt taking minimal wt through R LE despite blocking LE.  Utilized pad under hips and facilitation at trunk to attempt trunk/hip extension in standing.      Ambulation / Gait / Stairs / Office manager / Balance Dynamic Sitting Balance Sitting balance - Comments: pt only able to maintain sitting balance for ~5 seconds without UE support.  pt able to work on UE reaching tthough  needs intermittant MinA to maintain balance.      Special needs/care consideration Skin   Healing surgical incision    Location  L side of head Bowel mgmt: incontinent 9/23 last BM Bladder mgmt: continent Diabetic mgmt no   Previous Home Environment Living Arrangements: Children Available Help at Discharge: Family;Available 24 hours/day Type of Home: Mobile home Home Layout: One level Home Access: Stairs to enter Entrance Stairs-Rails: None Entrance Stairs-Number of Steps: 4 Home Care Services: No Additional Comments: no family present to confirm history Pt states when his son is not there he has a nephew also there.   Discharge Living Setting Plans for Discharge Living Setting: Patient's home Type of Home at Discharge: Mobile home Discharge Home Layout: One level Discharge Home Access: Stairs to enter Entrance Stairs-Rails: None Entrance Stairs-Number of Steps: 2 (2 at back entrance , most frequently used) Discharge Bathroom Shower/Tub: Tub/shower unit Discharge Bathroom Toilet: Standard Discharge Bathroom Accessibility: Yes How Accessible: Accessible via wheelchair Does the patient have any problems obtaining your medications?: No  Social/Family/Support Systems Patient Roles: Parent Anticipated Caregiver: son, Navraj Dreibelbis Anticipated Caregiver's Contact Information: cell  [phone]; alternate cell [phone] Ability/Limitations of Caregiver: Jin Capote states he is a Nutritional therapist who recently gave up his shop to be home more with his dad.  He states he is available 24/7 and has physical ability to do up to mod assist level with his dad Caregiver Availability: 24/7 Discharge Plan Discussed with Primary Caregiver: Yes Is Caregiver In Agreement with Plan?: Yes Does Caregiver/Family have Issues with Lodging/Transportation while Pt is in Rehab?: No  Goals/Additional Needs Patient/Family Goal for Rehab: supervision to min assist with PT/OT Expected length of stay:  14-20 days Cultural Considerations: none Equipment Needs: TBD Pt/Family Agrees to Admission and willing to participate: Yes Program Orientation Provided & Reviewed with Pt/Caregiver Including Roles  & Responsibilities: Yes   Decrease burden of Care through IP rehab admission: n/a  Possible need for SNF placement upon discharge: not anticipated  Patient Condition: This patient's medical and functional status has changed since the consult dated: 12/11/2013 in which the Rehabilitation Physician determined and documented that the patient's condition is appropriate for intensive rehabilitative care in an inpatient rehabilitation facility. See "History of Present Illness" (above) for medical update. Functional changes JYN:[id number] max assist transfers . Patient's medical and functional status update has been discussed with the Rehabilitation physician and patient remains appropriate for inpatient rehabilitation. Will admit to inpatient rehab today.  Preadmission Screen Completed By:  Cleatrice Burke, 12/13/2013 11:17 AM ______________________________________________________________________   Discussed status with Dr. Letta Pate on  12/13/2013 at  1116 and received telephone approval for admission today.  Admission Coordinator:  Cleatrice Burke, time 9371 Date 10/01 2015.

## 2013-12-12 NOTE — Progress Notes (Signed)
Inpatient Rehabilitation  I met with Mr. Bowns at the bedside and began discussions of his post acute rehab options.  Pt. Gave me permission to call his son [name] and daughter Jackelyn Poling.  I have been unable to reach son [name] (who patient lives with) as his cell phone voice mail is not set up and.  He has a work number listed in chart but the message is that the number is not set up to receive calls.  I then called pt's daughter Janeice Robinson.  Jackelyn Poling states she thinks it is best for me to have a conversation with her brother and will attempt to connect with him to ask him to call me.     In communicating with pt's RN Stanton Kidney , pt has had bigeminy again this am, as was mentioned in Dr. Pura Spice note from yesterday.  Dr. Nelda Marseille has requested that [facility] notify [facility] of need for cardiology consult.  Stanton Kidney is in the process of doing so now.    I will follow up as I am able to speak with son to determine availability of support post rehab.  Additionally, we will await cardiology input.    Please call me today if questions at [phone].   My coworker Danne Baxter will assume this case tomorrow and can be reached at [phone].    [place] Admissions Coordinator Cell [phone] Office [phone]

## 2013-12-12 NOTE — Progress Notes (Signed)
PULMONARY / CRITICAL CARE MEDICINE   Name: QUINTRELL BAZE MRN: [id number] DOB: [birth year]    ADMISSION DATE:  12/04/2013 CONSULTATION DATE:  12/04/2013  REFERRING MD :  Hal Neer  CHIEF COMPLAINT:  AMS  INITIAL PRESENTATION: 73 year old male presented to [facility] with AMS x 2-3 days. He has recently had falls as and takes xarelto. Head CT showed subdural hematoma on Left with resultant midline shift of 2cm. He was admitted by neurosurgery.  PCCM asked to assist with medical management.   STUDIES:  9/22 CT Head: Large left subdural hematoma with considerable mass-effect   SIGNIFICANT EVENTS: 9/22: Admitted for SDH.  9/23 surgical evacuation  SUBJECTIVE: Patient is in bigemini this AM.  No CP.  VITAL SIGNS: Temp:  [97 F (36.1 C)-98.5 F (36.9 C)] 98.5 F (36.9 C) (09/30 1146) Pulse Rate:  [29-62] 57 (09/30 1300) Resp:  [11-20] 20 (09/30 1300) BP: (94-131)/(49-84) 115/78 mmHg (09/30 1300) SpO2:  [94 %-99 %] 98 % (09/30 1300)  HEMODYNAMICS:   VENTILATOR SETTINGS:   INTAKE / OUTPUT:  Intake/Output Summary (Last 24 hours) at 12/12/13 1434 Last data filed at 12/12/13 1000  Gross per 24 hour  Intake    370 ml  Output   1100 ml  Net   -730 ml   PHYSICAL EXAMINATION: General:  Overweight male in NAD on RA Neuro:  Alert, oriented, moderately confused. HEENT:  [place]/AT, PERRL, No JVD noted Cardiovascular:  RRR Lungs: Clear anterior lung sounds Abdomen:  Soft, non-tender, non-distended Musculoskeletal:  Remote LLE AKA. Otherwise without deformity.  Skin:  Intact  LABS:  CBC  Recent Labs Lab 12/07/13 0620 12/08/13 0310 12/10/13 0258  WBC 5.2 5.3 5.4  HGB 11.9* 12.6* 13.7  HCT 38.2* 39.8 42.9  PLT 120* 127* 141*   Coag's No results found for this basename: APTT, INR,  in the last 168 hours  BMET  Recent Labs Lab 12/07/13 0620 12/08/13 0310 12/10/13 0258  NA 142 136* 135*  K 4.0 4.0 4.3  CL 107 101 97  CO2 22 24 27   BUN 16 17 18   CREATININE 1.35 1.20 1.14   GLUCOSE 100* 92 84   Electrolytes  Recent Labs Lab 12/07/13 0620 12/08/13 0310 12/10/13 0258 12/11/13 1326  CALCIUM 7.7* 8.4 8.6  --   MG 1.8 1.9 1.9 2.1  PHOS 2.7 2.6 3.3 3.7   Sepsis Markers No results found for this basename: LATICACIDVEN, PROCALCITON, O2SATVEN,  in the last 168 hours  ABG No results found for this basename: PHART, PCO2ART, PO2ART,  in the last 168 hours  Liver Enzymes No results found for this basename: AST, ALT, ALKPHOS, BILITOT, ALBUMIN,  in the last 168 hours  Cardiac Enzymes  Recent Labs Lab 12/11/13 1327 12/11/13 1852 12/12/13 0041  TROPONINI <0.30 <0.30 <0.30   Glucose No results found for this basename: GLUCAP,  in the last 168 hours Imaging No results found.  ASSESSMENT / PLAN:  PULMONARY A: No acute issues  P:   Monitor off O2, RA. Passed swallow evaluation, continue diet  CARDIOVASCULAR A: HTN  H/o CHF (echo 2013 LVEF 50-55%) Bigemini intermittently P:  SBP goal < 160 mm/Hg Followed by cardiology Burt Knack) as outpt - recommend calling on consultation for clearing to go to rehab as patient is intermittently going in and out of bigemini inspite of electrolytes being corrected and negative troponins, EKG noted. Continue preadmission coreg Hold preadmission atorvastatin for now  RENAL A:   CKD creat at baseline 1.5, oliguria Mild hypokalemia  P:   KVO IVF. Hold lasix Follow BMP Replace electrolytes as indicated  GASTROINTESTINAL A:   No acute issues  P:   Swallow evaluation passed, dysphagia 3 diet. SUP: IV protonix.  HEMATOLOGIC A:   H/o DVT/PE, filter placed 2013 on chronic xarelto  P:  Hold xarelto LIkely no longer a candidate for anticoagulation with falls Hold VTE chemoprophylaxis SCDs  INFECTIOUS A:   No acute issues  P:   Monitor clinically  ENDOCRINE A:   No acute issues   P:   Monitor glucose on chemitsry  NEUROLOGIC A:   Acute/subacute subdural hematoma Acute encephalopathy  (mild confusion and word finding issues) P:   Neurosurgery primary Monitor  Patient ready for rehab from PCCM standpoint, recommend calling cards, PCCM will sign off, please call back if needed.  I have personally obtained a history, examined the patient, evaluated laboratory and imaging results, formulated the assessment and plan and placed orders.  Rush Farmer, M.D. [facility] Pulmonary/Critical Care Medicine. Pager: [phone]. After hours pager: [phone].

## 2013-12-12 NOTE — Progress Notes (Signed)
Inpatient Rehabilitation  I received a return call from pt's son Verl Kitson . We discussed pt's post acute rehab needs and all venues for receiving this rehab.  I answered his questions regarding IP rehab and have left informational booklets in pt's room for son to review.  He confirms his intention to care for his dad post rehab.    I await cardiology consult and medical clearance for admission to IP rehab.  Danne Baxter will follow up tomorrow and can be reached a [phone].  [place] Admissions Coordinator Cell [phone] Office [phone]

## 2013-12-12 NOTE — Progress Notes (Signed)
Physical Therapy Treatment Patient Details Name: Kevin [name] MRN: [id number] DOB: 1940-10-10 Today's Date: 12/12/2013    History of Present Illness  73 year old male presented to [facility] with AMS x 2-3 days. He has recently had falls as and takes xarelto. Head CT showed subdural hematoma on Left with resultant midline shift of 2cm.  S/p evacuation 12/05/13.    PT Comments    Patient assisted to EOB for balance activities and there-ex. Patient tolerated well. Performed some RLE ROM activities and trunk stabilization activities with assist. Patient OOB to chair with +2 max assist this session. Will continue to see and progress as tolerated.   Follow Up Recommendations  CIR     Equipment Recommendations   (TBD)    Recommendations for Other Services Rehab consult;OT consult     Precautions / Restrictions Precautions Precautions: Fall Precaution Comments: Pt with Lt AKA; wears prosthesis, not in room Required Braces or Orthoses: Other Brace/Splint Other Brace/Splint: prosthesis Restrictions Weight Bearing Restrictions: No Other Position/Activity Restrictions: Son reports pt. stopped using prosthesis and began using wheelchair as primary mod of mobility due to pain in shoulders while using RW    Mobility  Bed Mobility Overal bed mobility: Needs Assistance;+2 for physical assistance Bed Mobility: Supine to Sit Rolling: Min assist   Supine to sit: Mod assist     General bed mobility comments: Patient with improved ability to come to EOB, with assist for elevation of trunk and VCs for hand placement  Transfers Overall transfer level: Needs assistance Equipment used: None (+2 face to face with gait belt and chuck pad) Transfers: Stand Pivot Transfers     Squat pivot transfers: Max assist;+2 physical assistance;+2 safety/equipment     General transfer comment: patient with difficulty taking weight through remaining LE.   Ambulation/Gait                 Stairs            Wheelchair Mobility    Modified Rankin (Stroke Patients Only)       Balance     Sitting balance-Leahy Scale: Poor Sitting balance - Comments: pt needs MinA and uses Bil UEs to maintain balance.   Postural control: Posterior lean (able to correct with cues)                          Cognition Arousal/Alertness: Awake/alert Behavior During Therapy: Flat affect Overall Cognitive Status: No family/caregiver present to determine baseline cognitive functioning Area of Impairment: Problem solving;Awareness;Attention;Following commands   Current Attention Level: Sustained;Selective   Following Commands: Follows one step commands with increased time   Awareness: Emergent Problem Solving: Slow processing;Requires tactile cues;Requires verbal cues;Difficulty sequencing;Decreased initiation      Exercises      General Comments General comments (skin integrity, edema, etc.): performed EOB trunk control activities. Patient able to maintain self supported sitting for 3 trials of 20 seconds without assist. Unable to tolerate challenge.      Pertinent Vitals/Pain Pain Assessment: No/denies pain    Home Living                      Prior Function            PT Goals (current goals can now be found in the care plan section) Acute Rehab PT Goals Patient Stated Goal: to go home with my family PT Goal Formulation: With patient Time For Goal Achievement: 12/16/13 Potential to Achieve  Goals: Fair Progress towards PT goals: Progressing toward goals    Frequency  Min 4X/week    PT Plan Current plan remains appropriate    Co-evaluation             End of Session Equipment Utilized During Treatment: Gait belt Activity Tolerance: Patient limited by fatigue Patient left: in chair;with call bell/phone within reach     Time: 0768-0881 PT Time Calculation (min): 21 min  Charges:  $Therapeutic Activity: 8-22 mins                    G CodesDuncan Dull 2013/12/26, 4:59 PM Alben Deeds, [place] DPT  [phone]

## 2013-12-12 NOTE — Progress Notes (Signed)
Patient ID: Kevin [name], male   DOB: Aug 21, 1940, 73 y.o.   MRN: [id number] Afeb, vss No new neuro issues. Wound clean and dry Ready for rehab when they think appropriate Dr Saintclair Halsted covering while Dr Christella Noa and I are away

## 2013-12-13 ENCOUNTER — Inpatient Hospital Stay (HOSPITAL_COMMUNITY)
Admission: RE | Admit: 2013-12-13 | Discharge: 2014-01-02 | DRG: 949 | Disposition: A | Discharge status: Home Health | Payer: Medicare Other | Source: Intra-hospital | Attending: Physical Medicine & Rehabilitation | Admitting: Physical Medicine & Rehabilitation

## 2013-12-13 DIAGNOSIS — N400 Enlarged prostate without lower urinary tract symptoms: Secondary | ICD-10-CM | POA: Diagnosis present

## 2013-12-13 DIAGNOSIS — R55 Syncope and collapse: Secondary | ICD-10-CM | POA: Diagnosis not present

## 2013-12-13 DIAGNOSIS — F419 Anxiety disorder, unspecified: Secondary | ICD-10-CM | POA: Diagnosis present

## 2013-12-13 DIAGNOSIS — E669 Obesity, unspecified: Secondary | ICD-10-CM | POA: Diagnosis present

## 2013-12-13 DIAGNOSIS — I519 Heart disease, unspecified: Secondary | ICD-10-CM | POA: Diagnosis not present

## 2013-12-13 DIAGNOSIS — E871 Hypo-osmolality and hyponatremia: Secondary | ICD-10-CM | POA: Diagnosis present

## 2013-12-13 DIAGNOSIS — K5900 Constipation, unspecified: Secondary | ICD-10-CM | POA: Diagnosis not present

## 2013-12-13 DIAGNOSIS — E861 Hypovolemia: Secondary | ICD-10-CM | POA: Diagnosis not present

## 2013-12-13 DIAGNOSIS — Z89612 Acquired absence of left leg above knee: Secondary | ICD-10-CM

## 2013-12-13 DIAGNOSIS — I509 Heart failure, unspecified: Secondary | ICD-10-CM | POA: Diagnosis not present

## 2013-12-13 DIAGNOSIS — I129 Hypertensive chronic kidney disease with stage 1 through stage 4 chronic kidney disease, or unspecified chronic kidney disease: Secondary | ICD-10-CM | POA: Diagnosis present

## 2013-12-13 DIAGNOSIS — I5032 Chronic diastolic (congestive) heart failure: Secondary | ICD-10-CM

## 2013-12-13 DIAGNOSIS — N390 Urinary tract infection, site not specified: Secondary | ICD-10-CM | POA: Diagnosis not present

## 2013-12-13 DIAGNOSIS — I498 Other specified cardiac arrhythmias: Secondary | ICD-10-CM

## 2013-12-13 DIAGNOSIS — I10 Essential (primary) hypertension: Secondary | ICD-10-CM | POA: Diagnosis present

## 2013-12-13 DIAGNOSIS — I9589 Other hypotension: Secondary | ICD-10-CM

## 2013-12-13 DIAGNOSIS — N179 Acute kidney failure, unspecified: Secondary | ICD-10-CM | POA: Diagnosis not present

## 2013-12-13 DIAGNOSIS — I2699 Other pulmonary embolism without acute cor pulmonale: Secondary | ICD-10-CM

## 2013-12-13 DIAGNOSIS — I369 Nonrheumatic tricuspid valve disorder, unspecified: Secondary | ICD-10-CM | POA: Diagnosis not present

## 2013-12-13 DIAGNOSIS — I252 Old myocardial infarction: Secondary | ICD-10-CM

## 2013-12-13 DIAGNOSIS — E875 Hyperkalemia: Secondary | ICD-10-CM | POA: Diagnosis present

## 2013-12-13 DIAGNOSIS — G8929 Other chronic pain: Secondary | ICD-10-CM | POA: Diagnosis present

## 2013-12-13 DIAGNOSIS — S065XAA Traumatic subdural hemorrhage with loss of consciousness status unknown, initial encounter: Secondary | ICD-10-CM | POA: Diagnosis present

## 2013-12-13 DIAGNOSIS — R159 Full incontinence of feces: Secondary | ICD-10-CM | POA: Diagnosis not present

## 2013-12-13 DIAGNOSIS — I499 Cardiac arrhythmia, unspecified: Secondary | ICD-10-CM

## 2013-12-13 DIAGNOSIS — E785 Hyperlipidemia, unspecified: Secondary | ICD-10-CM | POA: Diagnosis present

## 2013-12-13 DIAGNOSIS — R001 Bradycardia, unspecified: Secondary | ICD-10-CM | POA: Diagnosis not present

## 2013-12-13 DIAGNOSIS — S065X0D Traumatic subdural hemorrhage without loss of consciousness, subsequent encounter: Principal | ICD-10-CM

## 2013-12-13 DIAGNOSIS — I2510 Atherosclerotic heart disease of native coronary artery without angina pectoris: Secondary | ICD-10-CM | POA: Diagnosis present

## 2013-12-13 DIAGNOSIS — S065X5S Traumatic subdural hemorrhage with loss of consciousness greater than 24 hours with return to pre-existing conscious level, sequela: Secondary | ICD-10-CM | POA: Diagnosis not present

## 2013-12-13 DIAGNOSIS — I5022 Chronic systolic (congestive) heart failure: Secondary | ICD-10-CM | POA: Diagnosis not present

## 2013-12-13 DIAGNOSIS — N289 Disorder of kidney and ureter, unspecified: Secondary | ICD-10-CM

## 2013-12-13 DIAGNOSIS — N183 Chronic kidney disease, stage 3 (moderate): Secondary | ICD-10-CM | POA: Diagnosis present

## 2013-12-13 DIAGNOSIS — S065X9A Traumatic subdural hemorrhage with loss of consciousness of unspecified duration, initial encounter: Secondary | ICD-10-CM | POA: Diagnosis present

## 2013-12-13 DIAGNOSIS — I6200 Nontraumatic subdural hemorrhage, unspecified: Secondary | ICD-10-CM | POA: Diagnosis not present

## 2013-12-13 DIAGNOSIS — E860 Dehydration: Secondary | ICD-10-CM | POA: Diagnosis present

## 2013-12-13 DIAGNOSIS — N189 Chronic kidney disease, unspecified: Secondary | ICD-10-CM

## 2013-12-13 MED ORDER — MORPHINE SULFATE 4 MG/ML IJ SOLN
INTRAMUSCULAR | Status: AC
  Filled 2013-12-13: qty 2

## 2013-12-13 MED ORDER — TRAZODONE HCL 50 MG PO TABS: 25.0000 mg | ORAL_TABLET | Freq: Every evening | ORAL | Status: DC | PRN

## 2013-12-13 MED ORDER — BISACODYL 10 MG RE SUPP
10.0000 mg | Freq: Every day | RECTAL | Status: DC | PRN
  Administered 2013-12-21 – 2014-01-02 (×4): 10 mg via RECTAL
  Filled 2013-12-13 (×5): qty 1

## 2013-12-13 MED ORDER — SENNOSIDES-DOCUSATE SODIUM 8.6-50 MG PO TABS
2.0000 | ORAL_TABLET | Freq: Every day | ORAL | Status: DC
  Administered 2013-12-13 – 2013-12-28 (×15): 2 via ORAL
  Filled 2013-12-13 (×15): qty 2
  Filled 2013-12-13: qty 1

## 2013-12-13 MED ORDER — PANTOPRAZOLE SODIUM 40 MG PO TBEC
40.0000 mg | DELAYED_RELEASE_TABLET | Freq: Every day | ORAL | Status: DC
  Administered 2013-12-13 – 2014-01-01 (×20): 40 mg via ORAL
  Filled 2013-12-13 (×20): qty 1

## 2013-12-13 MED ORDER — CARVEDILOL 6.25 MG PO TABS
6.2500 mg | ORAL_TABLET | Freq: Two times a day (BID) | ORAL | Status: DC
  Administered 2013-12-13 – 2013-12-15 (×4): 6.25 mg via ORAL
  Filled 2013-12-13 (×6): qty 1

## 2013-12-13 MED ORDER — ALUM & MAG HYDROXIDE-SIMETH 200-200-20 MG/5ML PO SUSP
30.0000 mL | ORAL | Status: DC | PRN
  Administered 2013-12-20: 30 mL via ORAL
  Filled 2013-12-13: qty 30

## 2013-12-13 MED ORDER — ATORVASTATIN CALCIUM 20 MG PO TABS
20.0000 mg | ORAL_TABLET | Freq: Every day | ORAL | Status: DC
  Administered 2013-12-13 – 2014-01-01 (×20): 20 mg via ORAL
  Filled 2013-12-13 (×21): qty 1

## 2013-12-13 MED ORDER — TAMSULOSIN HCL 0.4 MG PO CAPS
0.4000 mg | ORAL_CAPSULE | Freq: Every day | ORAL | Status: DC
  Administered 2013-12-14: 0.4 mg via ORAL
  Filled 2013-12-13 (×2): qty 1

## 2013-12-13 MED ORDER — LEVETIRACETAM 500 MG PO TABS
500.0000 mg | ORAL_TABLET | Freq: Two times a day (BID) | ORAL | Status: DC
  Administered 2013-12-13 – 2014-01-02 (×40): 500 mg via ORAL
  Filled 2013-12-13 (×43): qty 1

## 2013-12-13 MED ORDER — ONDANSETRON HCL 4 MG/2ML IJ SOLN: 4.0000 mg | INTRAMUSCULAR | Status: DC | PRN

## 2013-12-13 MED ORDER — ACETAMINOPHEN 325 MG PO TABS
325.0000 mg | ORAL_TABLET | ORAL | Status: DC | PRN
  Administered 2013-12-14 – 2014-01-01 (×13): 650 mg via ORAL
  Filled 2013-12-13 (×13): qty 2

## 2013-12-13 MED ORDER — ONDANSETRON HCL 4 MG PO TABS
4.0000 mg | ORAL_TABLET | ORAL | Status: DC | PRN
  Administered 2013-12-17 (×2): 4 mg via ORAL
  Filled 2013-12-13 (×2): qty 1

## 2013-12-13 MED ORDER — HYDROCODONE-ACETAMINOPHEN 5-325 MG PO TABS
1.0000 | ORAL_TABLET | ORAL | Status: DC | PRN
  Administered 2013-12-13 – 2013-12-14 (×3): 1 via ORAL
  Filled 2013-12-13 (×3): qty 1

## 2013-12-13 MED ORDER — NALOXONE HCL 0.4 MG/ML IJ SOLN: 0.0800 mg | INTRAMUSCULAR | Status: DC | PRN

## 2013-12-13 MED ORDER — DIPHENHYDRAMINE HCL 12.5 MG/5ML PO ELIX: 12.5000 mg | ORAL_SOLUTION | Freq: Four times a day (QID) | ORAL | Status: DC | PRN

## 2013-12-13 MED ORDER — GUAIFENESIN-DM 100-10 MG/5ML PO SYRP: 5.0000 mL | ORAL_SOLUTION | Freq: Four times a day (QID) | ORAL | Status: DC | PRN

## 2013-12-13 MED ORDER — FLEET ENEMA 7-19 GM/118ML RE ENEM: 1.0000 | ENEMA | Freq: Once | RECTAL | Status: AC | PRN

## 2013-12-13 NOTE — Progress Notes (Signed)
PMR Admission Coordinator Pre-Admission Assessment   Patient: Kevin [name] is an 73 y.o., male MRN: [id number] DOB: 11/17/1940 Height: 5\' 7"  (170.2 cm) Weight: 93 kg (205 lb 0.4 oz)                                                                                                                                                  Insurance Information   PRIMARY:  Medicare A and B       Policy#: [id number]      Subscriber:  self Benefits:  Phone #: [place] online     Name:   Eff. Date: 09/12/2005     Deduct:  $1260      Out of Pocket Max:  none      Life Max:  none CIR:  100%       SNF:  100% first 20 days per Medicare guidelines Outpatient:  80%     Co-Pay: 20% Home Health:  100%       DME:   80%     Co-Pay:  20% Providers:  Pt. choice SECONDARY:  Medicaid of [place]       Policy#: [id number]      Subscriber:  self Benefits:  Phone #:   [phone]    Eff. Date:  10/14/11       Emergency Contact Information Contact Information     Name  Relation  Home  Work  [place]  Son    [phone]  [phone]     Kizzie Fantasia  [phone]    [phone]     Janeice Robinson  Daughter      [phone]     Shanon Rosser  Daughter      [phone]       Current Medical History  Patient Admitting Diagnosis: left SDH after fall   History of Present Illness: LAKER THOMPSON is a 73 y.o. right-handed male with history of hypertension, diastolic congestive heart failure, CAD with myocardial infarction maintained onXarelto and left above-knee amputation 2013 and received inpatient rehabilitation services and was discharged to home   . Patient lives at home with family mostly stays in his wheelchair but does use his prosthesis at times. Presented 12/04/2013 with reported numerous falls from his wheelchair and reported altered mental status per his son. CT then showed a large isodense subdural on the left side of the 2 cm midline shift. Underwent burr hole for evacuation of  hematoma 12/05/2013 per Dr. Cyndy Freeze. Placed on Keppra for seizure prophylaxis.    Tolerating a regular consistency diet. Follow cranial CT scan 12/10/2013 to be stable no hydrocephalus.    Noted to be in occasional ventricular bigeminy 12/12/13. Cardiology consulted.He is asymptomatic. Cardiology recommended continue beta blocker. Would not titrate beta blocker given his relatively low BP. LV function has been normal. This is not an  ischemic event. He can f/u with Dr. Burt Knack in 3-4 weeks after discharge.    Total: 7 NIH   Past Medical History  Past Medical History   Diagnosis  Date   .  Arthritis     .  Hypertension     .  CHF (congestive heart failure)         a. NICM/acute CHF 2007 with EF 10-20%. b. Improved EF 50-55% by echo 12/2011.   Marland Kitchen  Hyperlipidemia     .  Coronary artery disease         a. NSTEMI 2007 with cath showing mild nonobstructive CAD by cath 2007 (40% mLAD, 40%mRCA) at time of dx of NICM.   Marland Kitchen  Tortuous aorta         a. Noted by cath 2007, recommendation for OP [place].   Marland Kitchen  BPH (benign prostatic hyperplasia)     .  Wide-complex tachycardia         a. Noted 2007.   .  Obesity     .  Valvular heart disease         a. Mod MR by echo 06/2005 but no significant MR 2013.   Marland Kitchen  DVT (deep venous thrombosis)     .  Pulmonary embolism        Family History  family history includes CAD in his brother; Heart attack in his mother; Stroke in his mother.   Prior Rehab/Hospitalizations: son reports pt. Came to IP rehab following L AKA 2013, then subsequently had HHPT and OPPT          Current Medications  Current facility-administered medications:acetaminophen (TYLENOL) suppository 650 mg, 650 mg, Rectal, Q6H PRN, Faythe Ghee, MD;  acetaminophen (TYLENOL) tablet 650 mg, 650 mg, Oral, Q6H PRN, Faythe Ghee, MD;  bisacodyl (DULCOLAX) suppository 10 mg, 10 mg, Rectal, Daily PRN, Ashok Pall, MD;  carvedilol (COREG) tablet 6.25 mg, 6.25 mg, Oral, BID WC, Faythe Ghee, MD,  6.25 mg at 12/13/13 0945 hydrALAZINE (APRESOLINE) injection 10 mg, 10 mg, Intravenous, Q2H PRN, Colbert Coyer, MD, 10 mg at 12/06/13 0235;  HYDROcodone-acetaminophen (NORCO/VICODIN) 5-325 MG per tablet 1 tablet, 1 tablet, Oral, Q4H PRN, Ashok Pall, MD, 1 tablet at 12/13/13 0805;  labetalol (NORMODYNE,TRANDATE) injection 10-40 mg, 10-40 mg, Intravenous, Q10 min PRN, Ashok Pall, MD, 20 mg at 12/05/13 2307 levETIRAcetam (KEPPRA) tablet 500 mg, 500 mg, Oral, BID, Ashok Pall, MD, 500 mg at 12/13/13 0946;  morphine 2 MG/ML injection 1-2 mg, 1-2 mg, Intravenous, Q2H PRN, Ashok Pall, MD, 1 mg at 12/12/13 2253;  naloxone [facility]) injection 0.08 mg, 0.08 mg, Intravenous, PRN, Ashok Pall, MD;  ondansetron [facility]) injection 4 mg, 4 mg, Intravenous, Q4H PRN, Ashok Pall, MD, 4 mg at 12/06/13 0117 ondansetron (ZOFRAN) tablet 4 mg, 4 mg, Oral, Q4H PRN, Ashok Pall, MD;  pantoprazole (PROTONIX) EC tablet 40 mg, 40 mg, Oral, QHS, Ashok Pall, MD, 40 mg at 12/12/13 2100;  polyethylene glycol (MIRALAX / GLYCOLAX) packet 17 g, 17 g, Oral, Daily PRN, Ashok Pall, MD;  promethazine (PHENERGAN) tablet 12.5-25 mg, 12.5-25 mg, Oral, Q4H PRN, Ashok Pall, MD senna (SENOKOT) tablet 8.6 mg, 1 tablet, Oral, BID, Ashok Pall, MD, 8.6 mg at 12/13/13 0946;  tamsulosin (FLOMAX) capsule 0.4 mg, 0.4 mg, Oral, Daily, Faythe Ghee, MD, 0.4 mg at 12/13/13 2831   Patients Current Diet: Cardiac Regular with thin liquids; whole meds with pureed   Precautions / Restrictions Precautions Precautions: Fall Precaution Comments: Pt with Lt  AKA; wears prosthesis, not in room Other Brace/Splint: prosthesis Restrictions Weight Bearing Restrictions: No Other Position/Activity Restrictions: Son reports pt. stopped using prosthesis and began using wheelchair as primary mod of mobility due to pain in shoulders while using RW      Prior Activity Level Limited Community (1-2x/wk): Son reports pt. goes out with him 2-3  x/week for lunch, MD appointments and errands   Development worker, international aid / [place] Devices/Equipment: Wheelchair;Shower chair with back;Prosthesis;Transfer board;Brace (specify type);Sock aid Home Equipment: Walker - 2 wheels;Wheelchair - manual;Bedside commode   Prior Functional Level Prior Function Level of Independence: Independent with assistive device(s) Comments: pt stated "i wear my leg sometimes and sometimes im just in my chair"; no family present to confirm    Current Functional Level Cognition   Overall Cognitive Status: No family/caregiver present to determine baseline cognitive functioning Current Attention Level: Selective Orientation Level: Oriented X4 Following Commands: Follows multi-step commands with increased time Safety/Judgement: Decreased awareness of safety;Decreased awareness of deficits General Comments: pt continues to be unaware of deficits and level of A needed for mobility.  pt does follow all directionswhen given increase time.       Extremity Assessment (includes Sensation/Coordination)          ADLs        Mobility   Overal bed mobility: Needs Assistance;+2 for physical assistance Bed Mobility: Rolling;Sidelying to Sit Rolling: Min assist Sidelying to sit: Mod assist;+2 for physical assistance Supine to sit: Mod assist Sit to supine: Mod assist;+2 for physical assistance General bed mobility comments: cues for technique and A for bringing trunk up to sitting.      Transfers   Overall transfer level: Needs assistance Equipment used: 2 person hand held assist Transfers: Sit to/from Stand Sit to Stand: Max assist;+2 physical assistance Squat pivot transfers: Max assist;+2 physical assistance;+2 safety/equipment General transfer comment: Attempted to come to stand x2 with pt taking minimal wt through R LE despite blocking LE.  Utilized pad under hips and facilitation at trunk to attempt trunk/hip extension in standing.        Ambulation / Gait / Stairs / Teacher, adult education / Balance  Dynamic Sitting Balance Sitting balance - Comments: pt only able to maintain sitting balance for ~5 seconds without UE support.  pt able to work on UE reaching tthough needs intermittant MinA to maintain balance.       Special needs/care consideration  Skin   Healing surgical incision    Location  L side of head Bowel mgmt: incontinent 9/23 last BM Bladder mgmt: continent Diabetic mgmt no      Previous Home Environment Living Arrangements: Children Available Help at Discharge: Family;Available 24 hours/day Type of Home: Mobile home Home Layout: One level Home Access: Stairs to enter Entrance Stairs-Rails: None Entrance Stairs-Number of Steps: 4 Home Care Services: No Additional Comments: no family present to confirm history Pt states when his son is not there he has a nephew also there.    Discharge Living Setting Plans for Discharge Living Setting: Patient's home Type of Home at Discharge: Mobile home Discharge Home Layout: One level Discharge Home Access: Stairs to enter Entrance Stairs-Rails: None Entrance Stairs-Number of Steps: 2 (2 at back entrance , most frequently used) Discharge Bathroom Shower/Tub: Tub/shower unit Discharge Bathroom Toilet: Standard Discharge Bathroom Accessibility: Yes How Accessible: Accessible via wheelchair Does the patient have any problems obtaining your medications?: No   Social/Family/Support Systems Patient Roles: Parent Anticipated  Caregiver: son, Isam Unrein Anticipated Caregiver's Contact Information: cell  [phone]; alternate cell [phone] Ability/Limitations of Caregiver: Nahom Carfagno states he is a Nutritional therapist who recently gave up his shop to be home more with his dad.  He states he is available 24/7 and has physical ability to do up to mod assist level with his dad Caregiver Availability: 24/7 Discharge Plan Discussed with Primary  Caregiver: Yes Is Caregiver In Agreement with Plan?: Yes Does Caregiver/Family have Issues with Lodging/Transportation while Pt is in Rehab?: No   Goals/Additional Needs Patient/Family Goal for Rehab: supervision to min assist with PT/OT Expected length of stay: 14-20 days Cultural Considerations: none Equipment Needs: TBD Pt/Family Agrees to Admission and willing to participate: Yes Program Orientation Provided & Reviewed with Pt/Caregiver Including Roles  & Responsibilities: Yes     Decrease burden of Care through IP rehab admission: n/a   Possible need for SNF placement upon discharge: not anticipated   Patient Condition: This patient's medical and functional status has changed since the consult dated: 12/11/2013 in which the Rehabilitation Physician determined and documented that the patient's condition is appropriate for intensive rehabilitative care in an inpatient rehabilitation facility. See "History of Present Illness" (above) for medical update. Functional changes QVZ:DGLOVFI max assist transfers . Patient's medical and functional status update has been discussed with the Rehabilitation physician and patient remains appropriate for inpatient rehabilitation. Will admit to inpatient rehab today.   Preadmission Screen Completed By:  Cleatrice Burke, 12/13/2013 11:17 AM ______________________________________________________________________    Discussed status with Dr. Letta Pate on 12/13/2013 at  1116 and received telephone approval for admission today.   Admission Coordinator:  Cleatrice Burke, time 4332 Date 10/01 2015.          Cosigned by: Meredith Staggers, MD    [12/13/2013 12:53 PM]  Revision History...     Date/Time User Action   12/13/2013 12:53 PM Meredith Staggers, MD Cosign   12/13/2013 12:53 PM Cleatrice Burke, RN Sign   12/13/2013 12:41 PM Cleatrice Burke, RN Sign   12/13/2013 11:18 AM Cleatrice Burke, RN Sign    12/13/2013 11:17 AM Cleatrice Burke, RN Share   12/12/2013 4:22 PM Gerlean Ren Share  View Details Report

## 2013-12-13 NOTE — Progress Notes (Signed)
Patient ID: Kevin [name], male   DOB: 03-06-1941, 73 y.o.   MRN: [id number] Patient cleared for transfer to rehabilitation by cardiology.  Neurologic stable  Ready for transfer if bed available

## 2013-12-13 NOTE — Progress Notes (Signed)
Physical Therapy Treatment Patient Details Name: Kevin [name] MRN: [id number] DOB: 1940-06-20 Today's Date: 12/13/2013    History of Present Illness  73 year old male presented to [facility] with AMS x 2-3 days. He has recently had falls as and takes xarelto. Head CT showed subdural hematoma on Left with resultant midline shift of 2cm.  S/p evacuation 12/05/13.    PT Comments    Pt continues to need 2 person A for mobility and would benefit from CIR at D/C to maximize independence and decrease burden of care prior to returning to home.  Will continue to follow.    Follow Up Recommendations  CIR     Equipment Recommendations   (TBD)    Recommendations for Other Services       Precautions / Restrictions Precautions Precautions: Fall Precaution Comments: Pt with Lt AKA; wears prosthesis, not in room Restrictions Weight Bearing Restrictions: No    Mobility  Bed Mobility Overal bed mobility: Needs Assistance;+2 for physical assistance Bed Mobility: Rolling;Sidelying to Sit Rolling: Min assist Sidelying to sit: Mod assist;+2 for physical assistance       General bed mobility comments: cues for technique and A for bringing trunk up to sitting.    Transfers Overall transfer level: Needs assistance Equipment used: 2 person hand held assist Transfers: Sit to/from Stand Sit to Stand: Max assist;+2 physical assistance         General transfer comment: Attempted to come to stand x2 with pt taking minimal wt through R LE despite blocking LE.  Utilized pad under hips and facilitation at trunk to attempt trunk/hip extension in standing.    Ambulation/Gait                 Stairs            Wheelchair Mobility    Modified Rankin (Stroke Patients Only)       Balance Overall balance assessment: Needs assistance Sitting-balance support: Single extremity supported;Bilateral upper extremity supported;No upper extremity supported;Feet supported Sitting balance-Leahy  Scale: Poor Sitting balance - Comments: pt only able to maintain sitting balance for ~5 seconds without UE support.  pt able to work on UE reaching tthough needs intermittant MinA to maintain balance.     Standing balance support: During functional activity Standing balance-Leahy Scale: Zero                      Cognition Arousal/Alertness: Awake/alert Behavior During Therapy: Flat affect Overall Cognitive Status: No family/caregiver present to determine baseline cognitive functioning Area of Impairment: Attention;Awareness;Problem solving;Following commands;Safety/judgement   Current Attention Level: Selective   Following Commands: Follows multi-step commands with increased time Safety/Judgement: Decreased awareness of safety;Decreased awareness of deficits Awareness: Emergent Problem Solving: Slow processing;Difficulty sequencing;Requires verbal cues;Requires tactile cues General Comments: pt continues to be unaware of deficits and level of A needed for mobility.  pt does follow all directionswhen given increase time.      Exercises      General Comments        Pertinent Vitals/Pain Pain Assessment: Faces Faces Pain Scale: Hurts little more Pain Location: Indicates headache.      Home Living                      Prior Function            PT Goals (current goals can now be found in the care plan section) Acute Rehab PT Goals PT Goal Formulation: With patient Time  For Goal Achievement: 12/16/13 Potential to Achieve Goals: Fair Progress towards PT goals: Progressing toward goals    Frequency  Min 4X/week    PT Plan Current plan remains appropriate    Co-evaluation             End of Session Equipment Utilized During Treatment: Gait belt Activity Tolerance: Patient tolerated treatment well Patient left: in bed;with call bell/phone within reach;with bed alarm set     Time: 0815-0830 PT Time Calculation (min): 15 min  Charges:   $Therapeutic Activity: 8-22 mins                    G CodesCatarina Hartshorn, [place] 12/13/2013, 9:26 AM

## 2013-12-13 NOTE — Progress Notes (Signed)
Patient ID: Kevin [name], male   DOB: 06/25/1940, 73 y.o.   MRN: [id number] BP 136/101  Pulse 38  Temp(Src) 98.7 F (37.1 C) (Oral)  Resp 16  Ht 5\' 7"  (1.702 m)  Wt 93 kg (205 lb 0.4 oz)  BMI 32.10 kg/m2  SpO2 99% Alert, oriented x 3, speech is clear and fluent, does not know year.  Perrl, full eom Moving all extremities well Wound is clean, dry, no signs of infection

## 2013-12-13 NOTE — Plan of Care (Signed)
Problem: Progression Outcomes Goal: Initial discharge plan initiated Outcome: Completed/Met Date Met:  12/13/13 Plans to go to rehab

## 2013-12-13 NOTE — Progress Notes (Signed)
OT Cancellation Note  Patient Details Name: Kevin [name] MRN: [id number] DOB: Jan 18, 1941   Cancelled Treatment:    Reason Eval/Treat Not Completed: Other (comment) Pt being admitted to CIR today. If eval needed, please page number below. Will defer OT to CIR. Thank you. [place], OTR/L  [phone] 12/13/2013 12/13/2013, 12:56 PM

## 2013-12-13 NOTE — Progress Notes (Signed)
Nursing Note: Bp 110/60 AP-41 the patient alert and oriented x4 and is asymptomatic.On-call made aware of heart rate.Okay for pt to get his pain med.wbb

## 2013-12-13 NOTE — Discharge Summary (Signed)
  Physician Discharge Summary  Patient ID: Kevin [name] MRN: [id number] DOB/AGE: [birth year] 74 y.o.  Admit date: 12/04/2013 Discharge date: 12/13/2013  Admission Diagnoses:Chronic Subdural hematoma, left panhemispheric  Discharge Diagnoses:  Active Problems:   Subdural hematoma without coma   Subdural hematoma   Chronic subdural hematoma   Ventricular bigeminy   Discharged Condition: good  Hospital Course: Kevin [name] was admitted for treatment of a large subdural hematoma which appeared to be chronic. He was taken to the operating room for burr hole drainage of the hematoma. He did well post op. His wound is clean, dry, and without signs of infection. Repeat Head CT shows there is still residual blood present, but definite improvement. He will be discharged to rehab today.   Treatments: surgery: Burr holes for left subdural hematoma drainage  Discharge Exam: Blood pressure 96/79, pulse 42, temperature 98 F (36.7 C), temperature source Oral, resp. rate 22, height 5\' 7"  (1.702 m), weight 93 kg (205 lb 0.4 oz), SpO2 96.00%. General appearance: alert, cooperative, appears stated age and no distress Neurologic: Mental status: alertness: alert, orientation: time, person, place Cranial nerves: normal Motor: grossly normal  Disposition: 06-Home-Health Care Svc Subdural Hematoma    Medication List         atorvastatin 20 MG tablet  Commonly known as:  LIPITOR  Take 20 mg by mouth daily.     carvedilol 6.25 MG tablet  Commonly known as:  COREG  Take 6.25 mg by mouth 2 (two) times daily with a meal.     diazepam 5 MG tablet  Commonly known as:  VALIUM  Take 5 mg by mouth every 6 (six) hours as needed for anxiety.     furosemide 40 MG tablet  Commonly known as:  LASIX  Take 40 mg by mouth daily.     tamsulosin 0.4 MG Caps capsule  Commonly known as:  FLOMAX  Take 0.4 mg by mouth daily.     XARELTO 20 MG Tabs tablet  Generic drug:  rivaroxaban  Take 20 mg by mouth  daily.           Follow-up Information   Follow up with Winfield Cunas, MD.   Specialty:  Neurosurgery   Contact information:   [address] [place] [address] [phone]       Follow up with Sherren Mocha, MD On 01/07/2014. (@ 9:30am)    Specialty:  Cardiology   Contact information:   [address] [place] [address] [phone]       Follow up with Iasha Mccalister L, MD In 1 month. (call office to make an appointment)    Specialty:  Neurosurgery   Contact information:   [address] [place] [address] [phone]       Signed: Ryker Pherigo L 12/13/2013, 12:09 PM

## 2013-12-13 NOTE — Discharge Instructions (Signed)
Craniotomy °Care After °Please read the instructions outlined below and refer to this sheet in the next few weeks. These discharge instructions provide you with general information on caring for yourself after you leave the hospital. Your surgeon may also give you specific instructions. While your treatment has been planned according to the most current medical practices available, unavoidable complications occasionally occur. If you have any problems or questions after discharge, please call your surgeon. °Although there are many types of brain surgery, recovery following craniotomy (surgical opening of the skull) is much the same for each. However, recovery depends on many factors. These include the type and severity of brain injury and the type of surgery. It also depends on any nervous system function problems (neurological deficits) before surgery. If the craniotomy was done for cancer, chemotherapy and radiation could follow. You could be in the hospital from 5 days to a couple weeks. This depends on the type of surgery, findings, and whether there are complications. °HOME CARE INSTRUCTIONS  °· It is not unusual to hear a clicking noise after a craniotomy, the plates and screws used to attach the bone flap can sometimes cause this. It is a normal occurrence if this does happen °· Do not drive for 10 days after the operation °· Your scalp may feel spongy for a while, because of fluid under it. This will gradually get better. Occasionally, the surgeon will not replace the bone that was removed to access the brain. If there is a bony defect, the surgeon will ask you to wear a helmet for protection. This is a discussion you should have with your surgeon prior to leaving the hospital (discharge). °· Numbness may persist in some areas of your scalp. °· Take all medications as directed. Sometimes steroids to control swelling are prescribed. Anticonvulsants to prevent seizures may also be given. Do not use alcohol,  other drugs, or medications unless your surgeon says it is OK. °· Keep the wound dry and clean. The wound may be washed gently with soap and water. Then, you may gently blot or dab it dry, without rubbing. Do not take baths, use swimming pools or hot tubs for 10 days, or as instructed by your caregiver. It is best to wait to see you surgeon at your first postoperative visit, and to get directions at that time. °· Only take over-the-counter or prescription medicines for pain, discomfort, or fever as directed by your caregiver. °· You may continue your normal diet, as directed. °· Walking is OK for exercise. Wait at least 3 months before you return to mild, non-contact sports or as your surgeon suggests. Contact sports should be avoided for at least 1 year, unless your surgeon says it is OK. °· If you are prescribed steroids, take them exactly as prescribed. If you start having a decrease in nervous system functions (neurological deficits) and headaches as the dose of steroids is reduced, tell your surgeon right away. °· When the anticonvulsant prescription is finished you no longer need to take it. °SEEK IMMEDIATE MEDICAL CARE IF:  °· You develop nausea, vomiting, severe headaches, confusion, or you have a seizure. °· You develop chest pain, a stiff neck, or difficulty breathing. °· There is redness, swelling, or increasing pain in the wound or pin insertion sites. °· You have an increase in swelling or bruising around the eyes. °· There is drainage or pus coming from the wound. °· You have an oral temperature above 102° F (38.9° C), not controlled by medicine. °·   You notice a foul smell coming from the wound or dressing. °· The wound breaks open (edges not staying together) after the stitches have been removed. °· You develop dizziness or fainting while standing. °· You develop a rash. °· You develop any reaction or side effects to the medications given. °Document Released: 06/01/2005 Document Revised: 05/24/2011  Document Reviewed: 03/10/2009 °[employer]® Patient Information ©2013 [employer]. ° °

## 2013-12-13 NOTE — H&P (Signed)
Physical Medicine and Rehabilitation Admission H&P  Chief Complaint   Patient presents with   .  TBI with physical and cognitive deficits.   HPI: Kevin [name] is a 73 y.o. RH- male with H/o HTN, diastolic CHF, CAD--on Xarelto, L-AKA with SB transfers and increased falls recently. He was noted to have cognitive changes and was evaluated in ED on 12/04/13. CT head showed a large isodense subdural on the left frontal and left posterior parietal region with significant mass effect and 17 mm midline shift. He was taken to OR for burr hole for evacuation of chronic left SDH on 12/05/2013 per Dr. Cyndy Freeze. Post op with lethargy and substantial drainage from posterior drain sites and follow up CCT with moderate decrease in size of l-SDH and new foci of extra axial hemorrhage along right parafalcine region. As drainage resolved drains removed and follow up CCT stable with 1 cm evolving L-frontotemporal parenchymal contusion. Lethargy is resolving and patient not anticoagulation candidate due to falls. Has h/o DVT/PE with IVC filter placed 2013. Dr. Angelena Form consulted for input on ventricular bigeminy and recommended continuing BB as electrolytes stable and follow up on outpatient basis. Cardiac enzymes negative. Therapy ongoing and patient with difficulty following command, lacks awareness of deficits and working on balance and weight bearing thorough LLE. CIR recommended by MD and therapy team and patient admitted today.  Review of Systems  HENT: Negative for hearing loss.  Eyes: Negative for blurred vision and double vision.  Respiratory: Negative for cough and sputum production.  Cardiovascular: Negative for chest pain and palpitations.  Gastrointestinal: Positive for heartburn. Negative for nausea, abdominal pain and constipation.  Genitourinary: Negative for urgency and frequency.  Musculoskeletal: Positive for myalgias.  Neuropathy LLE?--takes percocet  Neurological: Positive for dizziness, sensory  change and headaches.  Psychiatric/Behavioral: Positive for memory loss.   Past Medical History   Diagnosis  Date   .  Arthritis    .  Hypertension    .  CHF (congestive heart failure)      a. NICM/acute CHF 2007 with EF 10-20%. b. Improved EF 50-55% by echo 12/2011.   Marland Kitchen  Hyperlipidemia    .  Coronary artery disease      a. NSTEMI 2007 with cath showing mild nonobstructive CAD by cath 2007 (40% mLAD, 40%mRCA) at time of dx of NICM.   Marland Kitchen  Tortuous aorta      a. Noted by cath 2007, recommendation for OP [place].   Marland Kitchen  BPH (benign prostatic hyperplasia)    .  Wide-complex tachycardia      a. Noted 2007.   .  Obesity    .  Valvular heart disease      a. Mod MR by echo 06/2005 but no significant MR 2013.   Marland Kitchen  DVT (deep venous thrombosis)    .  Pulmonary embolism     Past Surgical History   Procedure  Laterality  Date   .  Vena cava filter placement   01/03/2012     Procedure: INSERTION VENA-CAVA FILTER; Surgeon: Rosetta Posner, MD; Location: [facility]; Service: Vascular; Laterality: Left;   .  Amputation   01/05/2012     Procedure: AMPUTATION ABOVE KNEE; Surgeon: Rosetta Posner, MD; Location: [facility]; Service: Vascular; Laterality: Left;   .  Craniotomy  Left  12/05/2013     Procedure: CRANIOTOMY HEMATOMA EVACUATION SUBDURAL- LEFT; Surgeon: Ashok Pall, MD; Location: [facility]; Service: Neurosurgery; Laterality: Left; CRANIOTOMY HEMATOMA EVACUATION SUBDURAL- LEFT  Family History   Problem  Relation  Age of Onset   .  Heart attack  Mother      In her 32's   .  Stroke  Mother      In her 8's   .  CAD  Brother     Social History: Lives with son and nephew. Independent at wheelchair level and limited to transfers? Per reports that he has quit smoking. He uses smokeless tobacco. He reports that he does not drink alcohol or use illicit drugs.  Allergies: No Known Allergies  Medications Prior to Admission   Medication  Sig  Dispense  Refill   .  atorvastatin (LIPITOR) 20 MG tablet  Take 20 mg by  mouth daily.     .  carvedilol (COREG) 6.25 MG tablet  Take 6.25 mg by mouth 2 (two) times daily with a meal.     .  diazepam (VALIUM) 5 MG tablet  Take 5 mg by mouth every 6 (six) hours as needed for anxiety.     .  furosemide (LASIX) 40 MG tablet  Take 40 mg by mouth daily.     .  tamsulosin (FLOMAX) 0.4 MG CAPS  Take 0.4 mg by mouth daily.     Alveda Reasons 20 MG TABS  Take 20 mg by mouth daily.      Home:  Home Living  Family/patient expects to be discharged to:: Private residence  Living Arrangements: Children  Available Help at Discharge: Family;Available 24 hours/day  Type of Home: Mobile home  Home Access: Stairs to enter  Entrance Stairs-Number of Steps: 4  Entrance Stairs-Rails: None  Home Layout: One level  Home Equipment: Walker - 2 wheels;Wheelchair - manual;Bedside commode  Additional Comments: no family present to confirm history  Functional History:  Prior Function  Level of Independence: Independent with assistive device(s)  Comments: pt stated "i wear my leg sometimes and sometimes im just in my chair"; no family present to confirm  Functional Status:  Mobility:  Bed Mobility  Overal bed mobility: Needs Assistance;+2 for physical assistance  Bed Mobility: Rolling;Sidelying to Sit  Rolling: Min assist  Sidelying to sit: Mod assist;+2 for physical assistance  Supine to sit: Mod assist  Sit to supine: Mod assist;+2 for physical assistance  General bed mobility comments: cues for technique and A for bringing trunk up to sitting.  Transfers  Overall transfer level: Needs assistance  Equipment used: 2 person hand held assist  Transfers: Sit to/from Stand  Sit to Stand: Max assist;+2 physical assistance  Squat pivot transfers: Max assist;+2 physical assistance;+2 safety/equipment  General transfer comment: Attempted to come to stand x2 with pt taking minimal wt through R LE despite blocking LE. Utilized pad under hips and facilitation at trunk to attempt trunk/hip  extension in standing.    ADL: mod to max  Cognition:  Cognition  Overall Cognitive Status: No family/caregiver present to determine baseline cognitive functioning  Orientation Level: Oriented X4  Cognition  Arousal/Alertness: Awake/alert  Behavior During Therapy: Flat affect  Overall Cognitive Status: No family/caregiver present to determine baseline cognitive functioning  Area of Impairment: Attention;Awareness;Problem solving;Following commands;Safety/judgement  Orientation Level: Disoriented to;Situation (pt states MDs "Did something bad to me".)  Current Attention Level: Selective  Memory: Decreased recall of precautions;Decreased short-term memory  Following Commands: Follows multi-step commands with increased time  Safety/Judgement: Decreased awareness of safety;Decreased awareness of deficits  Awareness: Emergent  Problem Solving: Slow processing;Difficulty sequencing;Requires verbal cues;Requires tactile cues  General Comments: pt  continues to be unaware of deficits and level of A needed for mobility. pt does follow all directionswhen given increase time.    Physical Exam:  Blood pressure 96/69, pulse 77, temperature 98 F (36.7 C), temperature source Oral, resp. rate 21, height 5\' 7"  (1.702 m), weight 93 kg (205 lb 0.4 oz), SpO2 100.00%.  Physical Exam  HENT: dentition fair Left curvilinear craniotomy site with staples intact. Borderline dentition.  Eyes:  Pupils reactive to light. anicteric  Neck: Neck supple. No thyromegaly present.  Cardiovascular: Regular rhythm. No murmurs Respiratory: Effort normal and breath sounds normal. No respiratory distress. No rales GI: Soft. Bowel sounds are normal. He exhibits no distension.  Musculoskeletal:  Edematous left aka stump. RLE with evidence of multiple old healed abrasions on shin and few scabs on right knee.  Neurological:  Mood is flat but appropriate. Distracted and tends to rub scalp. He makes good eye contact. He was  able to provide his name, age, and date of birth. Knew he was in the hospital for a fall. Remembered inpatient rehab. Otherwise awareness and insight were limited. Follows simple commands. RUE 3+ to 4/5. RLE: 2/5 HF, 3/5 KE and 4- ankle. Some delay in processing with distraction needing redirection occasionally but generally appropriate.  Skin:  Left AKA site is well-healed, still edematous,substantial loose tissue  Psych: flat, cooperative   Results for orders placed during the hospital encounter of 12/04/13 (from the past 48 hour(s))   MAGNESIUM Status: None    Collection Time    12/11/13 1:26 PM   Result  Value  Ref Range    Magnesium  2.1  1.5 - 2.5 mg/dL   PHOSPHORUS Status: None    Collection Time    12/11/13 1:26 PM   Result  Value  Ref Range    Phosphorus  3.7  2.3 - 4.6 mg/dL   TROPONIN I Status: None    Collection Time    12/11/13 1:27 PM   Result  Value  Ref Range    Troponin I  <0.30  <0.30 ng/mL    Comment:      Due to the release kinetics of cTnI,     a negative result within the first hours     of the onset of symptoms does not rule out     myocardial infarction with certainty.     If myocardial infarction is still suspected,     repeat the test at appropriate intervals.   TROPONIN I Status: None    Collection Time    12/11/13 6:52 PM   Result  Value  Ref Range    Troponin I  <0.30  <0.30 ng/mL    Comment:      Due to the release kinetics of cTnI,     a negative result within the first hours     of the onset of symptoms does not rule out     myocardial infarction with certainty.     If myocardial infarction is still suspected,     repeat the test at appropriate intervals.   TROPONIN I Status: None    Collection Time    12/12/13 12:41 AM   Result  Value  Ref Range    Troponin I  <0.30  <0.30 ng/mL    Comment:      Due to the release kinetics of cTnI,     a negative result within the first hours     of the onset of symptoms does not rule out  myocardial infarction with certainty.     If myocardial infarction is still suspected,     repeat the test at appropriate intervals.    No results found.  Medical Problem List and Plan:  1. Functional deficits secondary to L frontoparietal SDH past falls in setting of anticoagulation.  2. DVT Prophylaxis/Anticoagulation: Mechanical: Antiembolism stockings, thigh (TED hose) Bilateral lower extremities  Sequential compression devices, below knee Bilateral lower extremities  3. Chronic pain?/ Headaches/ ain Management: Will continue hydrocodone prn for now.  4. Mood: Monitor for any evidence of anxiety. Question on valium for anxiety at home. LCSW to follow for evaluation and supportive  5. Neuropsych: This patient is capable of making decisions on his own behalf.  6. Skin/Wound Care: Routine pressure relief measures. Monitor for signs of breakdown.  7. Fluids/Electrolytes/Nutrition: Monitor I/O for adequate intake. Offer nutritional supplements if intake poor.  8. CAD with NISCM:Off Xarelto due to falls. Will monitor daily weights for stability as well as signs of overload. Low salt diet. On coreg bid. Resume Lipitor.  9. Seizure prophylaxis: Continue Keppra bid.  10. Hyponatremia: Will check follow up labs in am.   Post Admission Physician Evaluation:  1. Functional deficits secondary To L frontoparietal SDH past fall in setting of anticoagulation.  2. Patient is admitted to receive collaborative, interdisciplinary care between the physiatrist, rehab nursing staff, and therapy team. 3. Patient's level of medical complexity and substantial therapy needs in context of that medical necessity cannot be provided at a lesser intensity of care such as a SNF. 4. Patient has experienced substantial functional loss from his/her baseline which was documented above under the "Functional History" and "Functional Status" headings. Judging by the patient's diagnosis, physical exam, and functional history, the  patient has potential for functional progress which will result in measurable gains while on inpatient rehab. These gains will be of substantial and practical use upon discharge in facilitating mobility and self-care at the household level. 5. Physiatrist will provide 24 hour management of medical needs as well as oversight of the therapy plan/treatment and provide guidance as appropriate regarding the interaction of the two. 6. 24 hour rehab nursing will assist with bladder management, bowel management, safety, skin/wound care, disease management, medication administration, pain management and patient education and help integrate therapy concepts, techniques,education, etc. 7. PT will assess and treat for/with: Lower extremity strength, range of motion, stamina, balance, functional mobility, safety, adaptive techniques and equipment, NMR, cognitive perceptual awareness, family education, pain control. Goals are: supervision to min assist. 8. OT will assess and treat for/with: ADL's, functional mobility, safety, upper extremity strength, adaptive techniques and equipment, NMR, cognitive perceptual awareness, family ed, leisure awareness. Goals are: supervision to min assist. Therapy may not yet proceed with showering this patient. 9. SLP will assess and treat for/with: cognition, communication. Goals are: supervision to min assist. 10. Case Management and Social Worker will assess and treat for psychological issues and discharge planning. 11. Team conference will be held weekly to assess progress toward goals and to determine barriers to discharge. 12. Patient will receive at least 3 hours of therapy per day at least 5 days per week. 13. ELOS: 16-22 days  14. Prognosis: good  Meredith Staggers, MD, [facility] 12/13/2013   12/13/2013

## 2013-12-13 NOTE — Progress Notes (Signed)
Pt cleared by Neurosurgeon and Cardiology to admit pt to inpt rehab today. Pt is in agreement. I will make arrangements for admission today. I will contact his son, [name]. [phone]

## 2013-12-13 NOTE — Progress Notes (Signed)
Physical Medicine and Rehabilitation Consult Reason for Consult: Left chronic subdural hematoma Referring Physician: Dr. Cyndy Freeze     HPI: Kevin [name] is a 73 y.o. right-handed male with history of hypertension, diastolic congestive heart failure, CAD with myocardial infarction maintained onXarelto and left above-knee amputation 2013 and received inpatient rehabilitation services and was discharged to home. Patient lives at home with family mostly stays in his wheelchair but does use his prosthesis at times. Presented 12/04/2013 with reported numerous falls from his wheelchair and reported altered mental status per his son. CT then showed a large isodense subdural on the left side of the 2 cm midline shift. Underwent burr hole for evacuation of hematoma 12/05/2013 per Dr. Cyndy Freeze. Placed on Keppra for seizure prophylaxis. Tolerating a regular consistency diet. Follow cranial CT scan 12/10/2013 to be stable no hydrocephalus. Physical therapy evaluation completed 12/09/2013 with recommendations for physical medicine rehabilitation consult.   Review of Systems  Cardiovascular: Positive for leg swelling.  Genitourinary: Positive for urgency.  Musculoskeletal: Positive for falls.  Psychiatric/Behavioral: Positive for memory loss.  All other systems reviewed and are negative. Past Medical History   Diagnosis  Date   .  Arthritis     .  Hypertension     .  CHF (congestive heart failure)         a. NICM/acute CHF 2007 with EF 10-20%. b. Improved EF 50-55% by echo 12/2011.   Marland Kitchen  Hyperlipidemia     .  Coronary artery disease         a. NSTEMI 2007 with cath showing mild nonobstructive CAD by cath 2007 (40% mLAD, 40%mRCA) at time of dx of NICM.   Marland Kitchen  Tortuous aorta         a. Noted by cath 2007, recommendation for OP [place].   Marland Kitchen  BPH (benign prostatic hyperplasia)     .  Wide-complex tachycardia         a. Noted 2007.   .  Obesity     .  Valvular heart disease         a. Mod MR by echo 06/2005 but  no significant MR 2013.    Past Surgical History   Procedure  Laterality  Date   .  No past surgeries       .  Vena cava filter placement    01/03/2012       Procedure: INSERTION VENA-CAVA FILTER;  Surgeon: Rosetta Posner, MD;  Location: [facility];  Service: Vascular;  Laterality: Left;   .  Amputation    01/05/2012       Procedure: AMPUTATION ABOVE KNEE;  Surgeon: Rosetta Posner, MD;  Location: [facility];  Service: Vascular;  Laterality: Left;   .  Craniotomy  Left  12/05/2013       Procedure: CRANIOTOMY HEMATOMA EVACUATION SUBDURAL- LEFT;  Surgeon: Ashok Pall, MD;  Location: [facility];  Service: Neurosurgery;  Laterality: Left;  CRANIOTOMY HEMATOMA EVACUATION SUBDURAL- LEFT    Family History   Problem  Relation  Age of Onset   .  Heart attack  Mother         In her 17's   .  Stroke  Mother         In her 37's   .  CAD  Brother      Social History: reports that he has quit smoking. He uses smokeless tobacco. He reports that he does not drink alcohol or use illicit drugs. Allergies: No Known Allergies  Medications Prior to Admission   Medication  Sig  Dispense  Refill   .  atorvastatin (LIPITOR) 20 MG tablet  Take 20 mg by mouth daily.         .  carvedilol (COREG) 6.25 MG tablet  Take 6.25 mg by mouth 2 (two) times daily with a meal.         .  diazepam (VALIUM) 5 MG tablet  Take 5 mg by mouth every 6 (six) hours as needed for anxiety.          .  furosemide (LASIX) 40 MG tablet  Take 40 mg by mouth daily.          .  tamsulosin (FLOMAX) 0.4 MG CAPS  Take 0.4 mg by mouth daily.          Alveda Reasons 20 MG TABS  Take 20 mg by mouth daily.             Home: Home Living Family/patient expects to be discharged to:: Private residence Living Arrangements: Children Available Help at Discharge: Family;Available 24 hours/day Type of Home: Mobile home Home Access: Stairs to enter Entrance Stairs-Number of Steps: 4 Entrance Stairs-Rails: None Home Layout: One level Home Equipment: Walker - 2  wheels;Wheelchair - manual;Bedside commode Additional Comments: no family present to confirm history   Functional History: Prior Function Level of Independence: Independent with assistive device(s) Comments: pt stated "i wear my leg sometimes and sometimes im just in my chair"; no family present to confirm  Functional Status:   Mobility: Bed Mobility Overal bed mobility: Needs Assistance;+2 for physical assistance Bed Mobility: Supine to Sit;Sit to Supine Rolling: Max assist Supine to sit: Mod assist;+2 for physical assistance Sit to supine: Mod assist;+2 for physical assistance General bed mobility comments: pt attempts to A with mobility grabbing at bed rails to bring trunk up to sitting.  cues for sequencing and technique.   Transfers General transfer comment: pt declined to attempt.     ADL:   Cognition: Cognition Overall Cognitive Status: No family/caregiver present to determine baseline cognitive functioning Orientation Level: Oriented to person;Oriented to place;Oriented to situation Cognition Arousal/Alertness: Awake/alert Behavior During Therapy: Flat affect Overall Cognitive Status: No family/caregiver present to determine baseline cognitive functioning Area of Impairment: Attention;Memory;Orientation;Following commands;Safety/judgement;Awareness;Problem solving Orientation Level: Disoriented to;Situation (pt states MDs "Did something bad to me".) Current Attention Level: Sustained Memory: Decreased recall of precautions;Decreased short-term memory Following Commands: Follows one step commands with increased time;Follows multi-step commands inconsistently Safety/Judgement: Decreased awareness of safety;Decreased awareness of deficits Awareness: Emergent Problem Solving: Slow processing;Difficulty sequencing;Requires verbal cues;Requires tactile cues;Decreased initiation General Comments: Continues to be no family present to help determine baseline level of cognition.      Blood pressure 124/72, pulse 35, temperature 97.3 F (36.3 C), temperature source Oral, resp. rate 14, height 5\' 7"  (1.702 m), weight 72.576 kg (160 lb), SpO2 98.00%. Physical Exam  HENT:  Burr hole craniotomy site with staples intact.  Eyes:  Pupils reactive to light without nystagmus  Neck: Neck supple. No thyromegaly present.  Cardiovascular: Regular rhythm.   Respiratory: Effort normal and breath sounds normal. No respiratory distress.  GI: Soft. Bowel sounds are normal. He exhibits no distension.  Musculoskeletal:  Edematous left aka stump  Neurological:  Mood is flat but appropriate. He makes good eye contact with examiner. He was able to provide his name age and date of birth. Follows simple commands. RUE 3+ to 4/5. RLE: 2/5 HF, 3/5 KE and 4- ankle. Some  delay in processing but generally appropriate.   Skin:  Left AKA site is well-healed    No results found for this or any previous visit (from the past 24 hour(s)). Ct Head Wo Contrast   12/10/2013   CLINICAL DATA:  Followup subdural hematoma  EXAM: CT HEAD WITHOUT CONTRAST  TECHNIQUE: Contiguous axial images were obtained from the base of the skull through the vertex without intravenous contrast.  COMPARISON:  Prior CT from 12/08/2013  FINDINGS: Scalp soft tissue swelling with emphysema and overlying skin staples again seen within the left frontotemporal scalp. Two evacuation burr holes traverse the left calvarium. Previously seen subdural drain has been removed. Pneumocephalus within the left extra-axial space is slightly increased, which may related to subdural drain removal.  The acute on chronic left hematoma is overall not significantly changed in size measuring 11.8 mm over the left frontal lobe and 9.7 mm over the left parietal lobe. Associated left-to-right midline shift of 8 mm is similar. Hemorrhage again seen extending along the falx and left tentorium. Right parafalcine hemorrhage measures 12.8 x 8.3 mm, not  significantly changed. Scattered foci of hemorrhage more anteriorly within the paramedian right frontal lobe are also unchanged. No new intracranial hemorrhage.  There is an evolving parenchymal contusion within the peripheral left frontotemporal region measuring 1 cm in diameter (series 2, image 15). Small amount of localized vasogenic edema.  No hydrocephalus.  Basilar cisterns remain patent.  No acute large vessel territory infarct.  No acute abnormality seen about the orbits.  Paranasal sinuses and mastoid air cells are clear.  IMPRESSION: 1. Interval removal of left-sided subdural drains, with no significant interval change in size of left subdural hematoma. 2. 1 cm evolving left frontotemporal parenchymal contusion. This has not been previously described, but is unchanged relative to prior studies. 3. Persistent 8 mm of left-to-right midline shift, stable. No hydrocephalus. 4. Stable right parafalcine hemorrhages. 5. No new intracranial hemorrhage or other intracranial process.   Electronically Signed   By: Jeannine Boga M.D.   On: 12/10/2013 06:06     Assessment/Plan: Diagnosis: left SDH after fall Does the need for close, 24 hr/day medical supervision in concert with the patient's rehab needs make it unreasonable for this patient to be served in a less intensive setting? Yes Co-Morbidities requiring supervision/potential complications: hx of left AKA (w/c bound), CAD, CHF Due to bladder management, bowel management, safety, skin/wound care, disease management, medication administration, pain management and patient education, does the patient require 24 hr/day rehab nursing? Yes Does the patient require coordinated care of a physician, rehab nurse, PT (1-2 hrs/day, 5 days/week), OT (1-2 hrs/day, 5 days/week) and SLP (1-2 hrs/day, 5 days/week) to address physical and functional deficits in the context of the above medical diagnosis(es)? Yes Addressing deficits in the following areas: balance,  endurance, locomotion, strength, transferring, bowel/bladder control, bathing, dressing, feeding, grooming, toileting, cognition, speech and psychosocial support Can the patient actively participate in an intensive therapy program of at least 3 hrs of therapy per day at least 5 days per week? Yes and Potentially The potential for patient to make measurable gains while on inpatient rehab is good Anticipated functional outcomes upon discharge from inpatient rehab are supervision and min assist  with PT, supervision and min assist with OT, supervision with SLP. Estimated rehab length of stay to reach the above functional goals is: 14-20 days Does the patient have adequate social supports to accommodate these discharge functional goals? Yes and Potentially Anticipated D/C setting: Home Anticipated post  D/C treatments: HH therapy and Outpatient therapy Overall Rehab/Functional Prognosis: excellent   RECOMMENDATIONS: This patient's condition is appropriate for continued rehabilitative care in the following setting: CIR Patient has agreed to participate in recommended program. Yes Note that insurance prior authorization may be required for reimbursement for recommended care.   Comment: Rehab Admissions Coordinator to follow up. Need to clarify the family's ability to assist patient after dc.    Thanks,   Meredith Staggers, MD, Mellody Drown         12/11/2013    Revision History...     Date/Time User Action   12/11/2013 3:31 PM Meredith Staggers, MD Sign   12/11/2013 11:20 AM Cathlyn Parsons, PA-C Pend  View Details Report   Routing History...     Date/Time From To Method   12/11/2013 3:31 PM Meredith Staggers, MD Meredith Staggers, MD In Basket   12/11/2013 3:31 PM Meredith Staggers, MD Karma Greaser, DO Fax

## 2013-12-13 NOTE — Progress Notes (Signed)
Patient admitted to in-patient rehab 4W 11 from [facility]. Patient oriented to room, and rehab schedule. Patient given admission information packet, and all questions answered about rehab. Will continue to monitor patient.

## 2013-12-13 NOTE — Progress Notes (Signed)
    I have arranged cardiology follow up with Dr. Burt Knack later this month on 01/07/14 at 9:30am  Angelena Form PA-C  [facility]

## 2013-12-13 NOTE — Plan of Care (Signed)
Problem: Consults Goal: Diagnosis - Craniotomy Outcome: Completed/Met Date Met:  12/13/13 Subdural hematoma

## 2013-12-13 NOTE — Plan of Care (Signed)
Problem: Phase I Progression Outcomes Goal: Initial discharge plan identified Outcome: Completed/Met Date Met:  12/13/13 Plans to discharge to rehab today

## 2013-12-13 NOTE — Progress Notes (Signed)
On assessment at 1520 pm noted patient's apical pulse 42, and checked with dinamap pulse 36. Patient asymptomatic, with no complaints of discomfort. Notified Pam Love, PA no new orders given at this time. Will continue to monitor patient.

## 2013-12-14 ENCOUNTER — Inpatient Hospital Stay (HOSPITAL_COMMUNITY): Payer: Medicare Other

## 2013-12-14 ENCOUNTER — Inpatient Hospital Stay (HOSPITAL_COMMUNITY): Payer: Medicare Other | Admitting: Rehabilitation

## 2013-12-14 ENCOUNTER — Inpatient Hospital Stay (HOSPITAL_COMMUNITY): Payer: Medicare Other | Admitting: Speech Pathology

## 2013-12-14 DIAGNOSIS — R55 Syncope and collapse: Secondary | ICD-10-CM

## 2013-12-14 DIAGNOSIS — I9589 Other hypotension: Secondary | ICD-10-CM

## 2013-12-14 LAB — URINE MICROSCOPIC-ADD ON

## 2013-12-14 LAB — TROPONIN I: Troponin I: <0.3 ng/mL (ref <0.30)

## 2013-12-14 LAB — CBC WITH DIFFERENTIAL/PLATELET
BASOS ABS: 0 10*3/uL (ref 0.0–0.1)
Basophils Relative: 0 % (ref 0–1)
EOS ABS: 0.2 10*3/uL (ref 0.0–0.7)
Eosinophils Relative: 3 % (ref 0–5)
HCT: 43.4 % (ref 39.0–52.0)
Hemoglobin: 14.2 g/dL (ref 13.0–17.0)
Lymphocytes Relative: 28 % (ref 12–46)
Lymphs Abs: 1.9 10*3/uL (ref 0.7–4.0)
MCH: 27.3 pg (ref 26.0–34.0)
MCHC: 32.7 g/dL (ref 30.0–36.0)
MCV: 83.5 fL (ref 78.0–100.0)
Monocytes Absolute: 0.5 10*3/uL (ref 0.1–1.0)
Monocytes Relative: 8 % (ref 3–12)
NEUTROS PCT: 61 % (ref 43–77)
Neutro Abs: 4.1 10*3/uL (ref 1.7–7.7)
Platelets: 147 10*3/uL — ABNORMAL LOW (ref 150–400)
RBC: 5.2 MIL/uL (ref 4.22–5.81)
RDW: 16.6 % — AB (ref 11.5–15.5)
WBC: 6.7 10*3/uL (ref 4.0–10.5)

## 2013-12-14 LAB — COMPREHENSIVE METABOLIC PANEL
ALT: 17 U/L (ref 0–53)
ANION GAP: 16 — AB (ref 5–15)
AST: 16 U/L (ref 0–37)
Albumin: 3.3 g/dL — ABNORMAL LOW (ref 3.5–5.2)
Alkaline Phosphatase: 66 U/L (ref 39–117)
BUN: 31 mg/dL — ABNORMAL HIGH (ref 6–23)
CALCIUM: 8.9 mg/dL (ref 8.4–10.5)
CO2: 23 mEq/L (ref 19–32)
Chloride: 92 mEq/L — ABNORMAL LOW (ref 96–112)
Creatinine, Ser: 2.01 mg/dL — ABNORMAL HIGH (ref 0.50–1.35)
GFR calc Af Amer: 36 mL/min — ABNORMAL LOW (ref >90)
GFR calc non Af Amer: 31 mL/min — ABNORMAL LOW (ref >90)
Glucose, Bld: 128 mg/dL — ABNORMAL HIGH (ref 70–99)
Potassium: 4.7 mEq/L (ref 3.7–5.3)
SODIUM: 131 meq/L — AB (ref 137–147)
TOTAL PROTEIN: 7.6 g/dL (ref 6.0–8.3)
Total Bilirubin: 0.7 mg/dL (ref 0.3–1.2)

## 2013-12-14 LAB — URINALYSIS, ROUTINE W REFLEX MICROSCOPIC
Glucose, UA: NEGATIVE mg/dL
KETONES UR: 15 mg/dL — AB
NITRITE: POSITIVE — AB
PH: 5 (ref 5.0–8.0)
PROTEIN: 100 mg/dL — AB
Specific Gravity, Urine: 1.023 (ref 1.005–1.030)
Urobilinogen, UA: 1 mg/dL (ref 0.0–1.0)

## 2013-12-14 MED ORDER — LIDOCAINE HCL 2 % EX GEL
1.0000 "application " | Freq: Once | CUTANEOUS | Status: AC
  Administered 2013-12-14: 1 via URETHRAL
  Filled 2013-12-14 (×2): qty 5

## 2013-12-14 MED ORDER — LIDOCAINE HCL 2 % EX GEL
1.0000 "application " | Freq: Once | CUTANEOUS | Status: DC
  Filled 2013-12-14: qty 5

## 2013-12-14 MED ORDER — SODIUM CHLORIDE 0.45 % IV SOLN: INTRAVENOUS | Status: DC

## 2013-12-14 MED ORDER — TAMSULOSIN HCL 0.4 MG PO CAPS
0.4000 mg | ORAL_CAPSULE | Freq: Every day | ORAL | Status: DC
  Administered 2013-12-15 – 2014-01-01 (×18): 0.4 mg via ORAL
  Filled 2013-12-14 (×19): qty 1

## 2013-12-14 MED ORDER — CEPHALEXIN 250 MG PO CAPS
250.0000 mg | ORAL_CAPSULE | Freq: Three times a day (TID) | ORAL | Status: DC
  Administered 2013-12-14 – 2013-12-16 (×5): 250 mg via ORAL
  Filled 2013-12-14 (×8): qty 1

## 2013-12-14 MED ORDER — SODIUM CHLORIDE 0.9 % IV SOLN
INTRAVENOUS | Status: DC
  Administered 2013-12-14 (×2): via INTRAVENOUS

## 2013-12-14 NOTE — Progress Notes (Signed)
Occupational Therapy Note  Patient Details  Name: Kevin [name] MRN: [id number] Date of Birth: Jul 29, 1940  Today's Date: 12/14/2013 OT Missed Time: 60 Minutes Missed Time Reason: MD hold (comment) (verbal orders from RN )  Patient missing 60 min skilled OT/evaluation secondary to patient on medical hold (verbal orders from RN) and patient off unit for CT scan. Will follow-up as able.   [name] N 12/14/2013, 10:10 AM

## 2013-12-14 NOTE — Progress Notes (Signed)
Called to assist therapy with transferring patient from bed to wheelchair. Once in the chair noted cushion was rolled up underneath him, therapist left room to retrieve bari stedy. Patient became unresponsive with eyes fixated, patient assisted back to bed. Once patient in bed BP 88/56, recheck BP 90/71 pulse 88. Given order from Naaman Plummer, MD for NS 75 ml/h cont.  Patient is lethargic however responds appropriately. Rechecked BP 70/50, made Pam, PA aware, given order to increase NS 100 ml/ hr. Also, notified Rapid Response Nurse to assist with patient. Rechecked BP 120/72. Notified patient's daughter Janeice Robinson, and son Danyael Alipio. Will continue to monitor patient.

## 2013-12-14 NOTE — Plan of Care (Signed)
Problem: RH SKIN INTEGRITY Goal: RH STG ABLE TO PERFORM INCISION/WOUND CARE W/ASSISTANCE STG Able To Perform Incision/Wound Care With total Assistance.  Outcome: Not Applicable Date Met:  59/53/96 Staples to L scalp open to air and dry.No signs of infection

## 2013-12-14 NOTE — Plan of Care (Signed)
Problem: RH BLADDER ELIMINATION Goal: RH STG MANAGE BLADDER WITH ASSISTANCE STG Manage Bladder With minimal Assistance  Outcome: Not Progressing Patient requiring cath every 8 hours

## 2013-12-14 NOTE — Progress Notes (Signed)
Patient information reviewed and entered into eRehab system by Barbee Mamula, RN, CRRN, PPS Coordinator.  Information including medical coding and functional independence measure will be reviewed and updated through discharge.     Per nursing patient was given "Data Collection Information Summary for Patients in Inpatient Rehabilitation Facilities with attached "Privacy Act Statement-Health Care Records" upon admission.  

## 2013-12-14 NOTE — Evaluation (Signed)
Physical Therapy Assessment and Plan  Patient Details  Name: Kevin [name] MRN: [id number] Date of Birth: 23-Jun-1940  PT Diagnosis: Abnormal posture, Cognitive deficits and Hemiparesis dominant Rehab Potential: Fair ELOS: 21 days   Today's Date: 12/14/2013 PT Individual Time: 0830-0920 PT Individual Time Calculation (min): 50 min    Problem List:  Patient Active Problem List   Diagnosis Date Noted  . Traumatic subdural hemorrhage 12/13/2013  . Ventricular bigeminy 12/12/2013  . Subdural hematoma 12/05/2013  . 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 Subdural hematoma without coma 12/04/2013  . Amputee, 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  . Renal failure 01/11/2012  . Leucocytosis 01/11/2012  . DVT, femoral, acute 01/11/2012  . DVT of axillary vein, acute 01/04/2012  . Acute pulmonary embolism 01/04/2012  . 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 Severe sepsis(995.92) 01/02/2012  . Septic shock(785.52) 01/02/2012  . Hypotension 01/02/2012  . Right ventricular failure 01/02/2012  . 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 HTN (hypertension) 12/31/2011  . CAD (coronary artery disease) 12/31/2011  . CHF (congestive heart failure) 12/31/2011  . Tobacco abuse 12/31/2011  . Hyperlipidemia 12/31/2011    Past Medical History:  Past Medical History  Diagnosis Date  . Arthritis   . Hypertension   . CHF (congestive heart failure)     a. NICM/acute CHF 2007 with EF 10-20%. b. Improved EF 50-55% by echo 12/2011.  Marland Kitchen Hyperlipidemia   . Coronary artery disease     a. NSTEMI 2007 with cath showing mild nonobstructive CAD by cath 2007 (40% mLAD, 40%mRCA) at time of dx of NICM.  Marland Kitchen Tortuous aorta     a. Noted by cath 2007, recommendation for OP [place].  Marland Kitchen BPH (benign prostatic hyperplasia)   . Wide-complex tachycardia     a. Noted 2007.  . Obesity   . Valvular heart disease     a. Mod MR by echo 06/2005 but no significant MR 2013.  Marland Kitchen DVT (deep venous thrombosis)   . Pulmonary embolism    Past Surgical  History:  Past Surgical History  Procedure Laterality Date  . Vena cava filter placement  01/03/2012    Procedure: INSERTION VENA-CAVA FILTER;  Surgeon: Rosetta Posner, MD;  Location: [place];  Service: Vascular;  Laterality: Left;  . Amputation  01/05/2012    Procedure: AMPUTATION ABOVE KNEE;  Surgeon: Rosetta Posner, MD;  Location: [place];  Service: Vascular;  Laterality: Left;  . Craniotomy Left 12/05/2013    Procedure: CRANIOTOMY HEMATOMA EVACUATION SUBDURAL- LEFT;  Surgeon: Ashok Pall, MD;  Location: [place] [facility];  Service: Neurosurgery;  Laterality: Left;  CRANIOTOMY HEMATOMA EVACUATION SUBDURAL- LEFT    Assessment & Plan Clinical Impression: Patient is a 73 y.o. year old male with H/o HTN, diastolic CHF, CAD--on Xarelto, L-AKA with SB transfers and increased falls recently. He was noted to have cognitive changes and was evaluated in ED on 12/04/13. CT head showed a large isodense subdural on the left frontal and left posterior parietal region with significant mass effect and 17 mm midline shift. He was taken to OR for burr hole for evacuation of chronic left SDH on 12/05/2013 per Dr. Cyndy Freeze. Post op with lethargy and substantial drainage from posterior drain sites and follow up CCT with moderate decrease in size of l-SDH and new foci of extra axial hemorrhage along right parafalcine region. As drainage resolved drains removed and follow up CCT stable with 1 cm evolving L-frontotemporal parenchymal contusion. Lethargy is resolving and patient not anticoagulation candidate due to falls. Has  h/o DVT/PE with IVC filter placed 2013. Dr. McAlhany consulted for input on ventricular bigeminy and recommended continuing BB as electrolytes stable and follow up on outpatient basis. Cardiac enzymes negative. Therapy ongoing and patient with difficulty following command, lacks awareness of deficits and working on balance and weight bearing thorough LLE. CIR recommended by MD and therapy team and patient admitted  today.  Patient transferred to CIR on 12/13/2013 .   Patient currently requires +2 assist with transfers and max A for bed mobility with mobility secondary to muscle weakness, decreased cardiorespiratoy endurance, decreased motor planning, decreased midline orientation and decreased attention to left and decreased initiation, decreased attention, decreased memory and delayed processing.  Prior to hospitalization, patient was supervision to min A with mobility and lived with Family (son and nephew, son available 24/7) in a Mobile home home.  Home access is 3Stairs to enter.  Patient will benefit from skilled PT intervention to maximize safe functional mobility, minimize fall risk and decrease caregiver burden for planned discharge home with 24 hour supervision.  Anticipate patient will benefit from follow up HH at discharge.  PT - End of Session Activity Tolerance: Tolerates < 10 min activity with changes in vital signs Endurance Deficit: Yes PT Assessment Rehab Potential: Fair Barriers to Discharge: Decreased caregiver support PT Patient demonstrates impairments in the following area(s): Balance;Endurance;Motor;Safety PT Transfers Functional Problem(s): Bed Mobility;Bed to Chair;Car;Furniture PT Locomotion Functional Problem(s): Wheelchair Mobility PT Plan PT Intensity: Minimum of 1-2 x/day ,45 to 90 minutes PT Frequency: 5 out of 7 days PT Duration Estimated Length of Stay: 21 days PT Treatment/Interventions: Balance/vestibular training;Discharge planning;Disease management/prevention;DME/adaptive equipment instruction;Functional mobility training;Neuromuscular re-education;Patient/family education;Stair training;Therapeutic Activities;Therapeutic Exercise;UE/LE Strength taining/ROM;UE/LE Coordination activities;Visual/perceptual remediation/compensation;Wheelchair propulsion/positioning PT Transfers Anticipated Outcome(s): S level PT Locomotion Anticipated Outcome(s): S from w/c, no functional  ambulation anticipated PT Recommendation Follow Up Recommendations: Home health PT;24 hour supervision/assistance Patient destination: Home Equipment Recommended: To be determined  Skilled Therapeutic Intervention PT evaluation limited due to medical issues during session.  Pt was able to answer all questions appropriately during session, however with increased time for processing.  He also was able to perform bed mobility at max A level with max A cues for sequencing and technique.  Once at EOB, initiated scooting towards w/c at max A level with facilitation over back for forward trunk flexion and stability at R knee for WB and to prevent buckle.  Nurse called into room to assist with transfer for safety and to stabilize w/c.  Once in chair, note that cushion was rolled underneath pts buttocks.  Unable to assist/elevate buttocks enough to fix, therefore went to retrieve bari stedy with RN still in room.  During this time, pt became unresponsive and was assisted back to bed with MD/PA and RN all in room assessing.  MD states likely vaso vagal response.  BP once sitting up in bed (HOB inclined) was 88/56, lowered HOB flat and rechecked BP and was 90/71 with HR of 88.  Then noted HR to vary from high 20's to 90's and BP was 104/79.  RN in room and to report to MD.  Pt noted to have increased lethargy nearing end of session but would respond.  Pt left in bed with nursing care and EKG being done.    PT Evaluation Precautions/Restrictions Precautions Precautions: Fall Precaution Comments: Pt with Lt AKA; wears prosthesis, not in room, burr hole evacuation Required Braces or Orthoses: Other Brace/Splint Other Brace/Splint: prosthesis Restrictions Weight Bearing Restrictions: No General PT Amount of   Missed Time (min): 10 Minutes PT Missed Treatment Reason: Other (Comment) (medical issues with BP and HR) Vital Signs  Pain Pain Assessment Pain Assessment: No/denies pain Pain Score: 0-No pain Home  Living/Prior Functioning Home Living Available Help at Discharge: Family;Available 24 hours/day Type of Home: Mobile home Home Access: Stairs to enter Entrance Stairs-Number of Steps: 3 Entrance Stairs-Rails: None (family bumps pt in w/c, per pt) Home Layout: One level  Lives With: Family (son and nephew, son available 24/7) Prior Function Level of Independence: Independent with transfers;Requires assistive device for independence;Needs assistance with tranfers  Able to Take Stairs?: No Driving: No Leisure: Hobbies-yes (Comment) (likes to play cards) Comments: Pt states that if he needed help getting into shower, he would get family to assist, states he has not worn prosthetic leg in 4 months Vision/Perception   See OT note, did not formally assess due to time Cognition Overall Cognitive Status: No family/caregiver present to determine baseline cognitive functioning Arousal/Alertness: Lethargic Orientation Level: Oriented to person;Oriented to place;Oriented to time (states I'm here b/c I had a blood clot) Attention: Sustained;Focused Focused Attention: Appears intact Sustained Attention: Impaired Sustained Attention Impairment: Functional basic;Verbal basic Memory: Impaired Memory Impairment: Retrieval deficit;Decreased recall of new information Awareness: Impaired Awareness Impairment: Intellectual impairment Safety/Judgment: Impaired Comments: Difficult to get a good assessment of safety, however requires multiple cues sitting at EOB for safety, as he would lean over to the R outside BOS.  Sensation Sensation Light Touch: Appears Intact Stereognosis: Not tested Hot/Cold: Not tested Proprioception: Appears Intact Coordination Gross Motor Movements are Fluid and Coordinated: Yes Fine Motor Movements are Fluid and Coordinated: Yes (in RLE) Motor  Motor Motor: Abnormal postural alignment and control;Hemiplegia Motor - Skilled Clinical Observations: R sided weakness,  decreased postural control in sitting  Mobility Bed Mobility Bed Mobility: Rolling Left;Left Sidelying to Sit Rolling Left: 3: Mod assist Rolling Left Details: Verbal cues for sequencing;Verbal cues for technique;Verbal cues for precautions/safety;Manual facilitation for weight shifting;Manual facilitation for placement Left Sidelying to Sit: 2: Max assist Left Sidelying to Sit Details: Verbal cues for sequencing;Verbal cues for technique;Verbal cues for precautions/safety;Manual facilitation for weight shifting;Manual facilitation for weight bearing;Manual facilitation for placement Transfers Transfers: Yes Squat Pivot Transfers: 1: +2 Total assist Squat Pivot Transfer Details: Verbal cues for sequencing;Verbal cues for technique;Verbal cues for precautions/safety;Manual facilitation for weight shifting;Manual facilitation for weight bearing;Manual facilitation for placement Locomotion  Ambulation Ambulation: No Gait Gait: No Stairs / Additional Locomotion Stairs: No Wheelchair Mobility Wheelchair Mobility: No  Trunk/Postural Assessment  Cervical Assessment Cervical Assessment: Exceptions to [facility] Cervical Strength Overall Cervical Strength Comments: keeps head turned to the R Thoracic Assessment Thoracic Assessment: Exceptions to [facility] Thoracic Strength Overall Thoracic Strength Comments: forward flexed posture, rounded shoulders Lumbar Assessment Lumbar Assessment: Exceptions to [facility] Lumbar Strength Overall Lumbar Strength Comments: pt sits with posterior pelvic tilt Postural Control Postural Control: Deficits on evaluation Postural Limitations: Pt tends to lean far to the R when sitting at EOB.   Balance Dynamic Sitting Balance Sitting balance - Comments: Pt able to sit at EOB x 2-3 mins at min/mod A level with RUE support, but would tend to keep R hand extremely laterally and posteriorly Extremity Assessment      RLE Assessment RLE Assessment: Exceptions to [facility] RLE  Strength Right Hip Flexion: 2/5 Right Hip Extension: 2+/5 (was in bed, unsure if full range) Right Knee Flexion: 2/5 Right Knee Extension: 2+/5 (unsure if full range, was in bed) Right Ankle Dorsiflexion: 3+/5 Right Ankle Plantar Flexion:  3+/5 LLE Assessment LLE Assessment: Exceptions to WFL LLE Strength LLE Overall Strength Comments: hip motions with generalized weakness, grossly WFL, did not formally test  FIM:  FIM - Bed/Chair Transfer Bed/Chair Transfer Assistive Devices: Arm rests Bed/Chair Transfer: 1: Two helpers;2: Supine > Sit: Max A (lifting assist/Pt. 25-49%) FIM - Locomotion: Wheelchair Locomotion: Wheelchair: 0: Activity did not occur FIM - Locomotion: Ambulation Locomotion: Ambulation: 0: Activity did not occur FIM - Locomotion: Stairs Locomotion: Stairs: 0: Activity did not occur   Refer to Care Plan for Long Term Goals  Recommendations for other services: None  Discharge Criteria: Patient will be discharged from PT if patient refuses treatment 3 consecutive times without medical reason, if treatment goals not met, if there is a change in medical status, if patient makes no progress towards goals or if patient is discharged from hospital.  The above assessment, treatment plan, treatment alternatives and goals were discussed and mutually agreed upon: by patient  Parcell, Emily Ann 12/14/2013, 10:59 AM  

## 2013-12-14 NOTE — Progress Notes (Signed)
Subjective:   73 yo male with history of HTN, non-ischemic cardiomyopathy (last EF 50-55% in 2013), HLD, CAD, DVT/PE admitted 12/04/13 to Neurosurgery service with neurological changes and found to have large isodense subdural hematoma on the left side with 2 cm of midline shift. He is on Xarelto chronically. He has been noted to have frequent falls at home. He has been followed in the hospital by Neurosurgery and PCCM. The hematoma was evacuated 12/05/13.   Patient now in [place]. We were asked to see due to unresponsive episode while pushing WC this am.  Felt to be due to pre-renal azotemia. Cr 1.14 -> 2.10  SBP 110. Trop negative. Denies CP, dizziness, palpitations. Doesn't remember passing out today    Intake/Output Summary (Last 24 hours) at 12/14/13 1446 Last data filed at 12/14/13 1212  Gross per 24 hour  Intake    360 ml  Output    500 ml  Net   -140 ml    Current meds: . atorvastatin  20 mg Oral q1800  . carvedilol  6.25 mg Oral BID WC  . levETIRAcetam  500 mg Oral BID  . pantoprazole  40 mg Oral QHS  . senna-docusate  2 tablet Oral QHS  . [START ON 12/15/2013] tamsulosin  0.4 mg Oral QPC supper   Infusions: . sodium chloride 100 mL/hr at 12/14/13 1108     Objective:  Blood pressure 116/83, pulse 77, temperature 98.1 F (36.7 C), temperature source Oral, resp. rate 20, height 5\' 7"  (1.702 m), weight 92.1 kg (203 lb 0.7 oz), SpO2 99.00%. Weight change:   Physical Exam: HEENT: craniotomy scar intact  Neck: Neck supple. No thyromegaly present.  Cardiovascular: Regular rhythm. No murmurs  Respiratory: Effort normal and breath sounds normal. No respiratory distress. No rales  GI: Soft. Bowel sounds are normal. He exhibits no distension.  Musculoskeletal:  Edematous left aka stump. RLE with evidence of multiple old healed abrasions on shin and few scabs on right knee. Cool and mildly edmeatous Neurological: awake and later with speech latency.  Psych: flat,  cooperative   Lab Results: Basic Metabolic Panel:  Recent Labs Lab 12/08/13 0310 12/10/13 0258 12/11/13 1326 12/14/13 0506  NA 136* 135*  --  131*  K 4.0 4.3  --  4.7  CL 101 97  --  92*  CO2 24 27  --  23  GLUCOSE 92 84  --  128*  BUN 17 18  --  31*  CREATININE 1.20 1.14  --  2.01*  CALCIUM 8.4 8.6  --  8.9  MG 1.9 1.9 2.1  --   PHOS 2.6 3.3 3.7  --    Liver Function Tests:  Recent Labs Lab 12/14/13 0506  AST 16  ALT 17  ALKPHOS 66  BILITOT 0.7  PROT 7.6  ALBUMIN 3.3*   No results found for this basename: LIPASE, AMYLASE,  in the last 168 hours No results found for this basename: AMMONIA,  in the last 168 hours CBC:  Recent Labs Lab 12/08/13 0310 12/10/13 0258 12/14/13 0506  WBC 5.3 5.4 6.7  NEUTROABS  --   --  4.1  HGB 12.6* 13.7 14.2  HCT 39.8 42.9 43.4  MCV 82.4 84.3 83.5  PLT 127* 141* 147*   Cardiac Enzymes:  Recent Labs Lab 12/11/13 1327 12/11/13 1852 12/12/13 0041 12/14/13 1235  TROPONINI <0.30 <0.30 <0.30 <0.30   BNP: No components found with this basename: POCBNP,  CBG: No results found for this basename: GLUCAP,  in the last 168 hours Microbiology: Lab Results  Component Value Date   CULT NORMAL OROPHARYNGEAL FLORA 01/02/2012   CULT NO GROWTH 01/02/2012   CULT NO GROWTH 5 DAYS 01/02/2012   CULT NO GROWTH 5 DAYS 01/02/2012   No results found for this basename: CULT, SDES,  in the last 168 hours  Imaging: Ct Head Wo Contrast  12/14/2013   CLINICAL DATA:  Left-sided subdural hematoma.  EXAM: CT HEAD WITHOUT CONTRAST  TECHNIQUE: Contiguous axial images were obtained from the base of the skull through the vertex without intravenous contrast.  COMPARISON:  CT scan of December 10, 2013.  FINDINGS: Two surgical burr holes are again noted involving the left frontal and parietal skull. Surgical staples are again noted. Paranasal sinuses appear normal. Soft tissue gas is seen in the left temporal scalp region as well as postoperative  pneumocephalus seen in the left frontal region which is decreased compared to prior exam. Stable parafalcine and left tentorial subdural hematoma is noted. Ventricular size is within normal limits. 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 There remains approximately 7 mm of left to right midline shift which is not significantly changed compared to prior exam. The evolving contusion in the left temporal lobe seen on prior exam is not well visualized currently. No acute infarction is noted.  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 Stable left to right midline shift of 7 mm is noted. Stable small parafalcine and left tentorial subdural hematoma is noted   Electronically Signed   By: Sabino Dick M.D.   On: 12/14/2013 10:43     ASSESSMENT:  1. Syncope 2. SDH s/p evacuation 3. PAD 4. AKI  PLAN/DISCUSSION:  Agree that syncope likely due to volume depletion. Agree with careful hydration. Will repeat echo to reassess LV function but doubt any further cardiac w/u is needed. Follow renal function. We will follow from a distance.     LOS: 1 day    Glori Bickers, MD 12/14/2013, 2:46 PM

## 2013-12-14 NOTE — Progress Notes (Signed)
Occupational Therapy Note  Patient Details  Name: Kevin [name] MRN: [id number] Date of Birth: [birth year]  Today's Date: 12/14/2013 OT Missed Time: 35 Minutes Missed Time Reason: MD hold (comment)  Patient missing 60 min skilled OT secondary to MD hold. Per PA, cardiology consult and hold therapies until tomorrow.    Lashya Passe N 12/14/2013, 1:11 PM

## 2013-12-14 NOTE — Progress Notes (Signed)
Occupational Therapy Session Note  Patient Details  Name: MASE DHONDT MRN: [id number] Date of Birth: 03-26-1940  Today's Date: 12/14/2013 OT Individual Time: 1130-1130 OT Individual Time Calculation (min): 0 min   Skilled Therapeutic Interventions/Progress Updates: Pt unable to participate in scheduled therapy per MD order.  Therapy on hold this date d/t cardiac precautions.    Therapy Documentation Precautions:  Precautions Precautions: Fall Precaution Comments: Pt with Lt AKA; wears prosthesis, not in room, burr hole evacuation Required Braces or Orthoses: Other Brace/Splint Other Brace/Splint: prosthesis Restrictions Weight Bearing Restrictions: No  General: General OT Amount of Missed Time: 30 Minutes  (On-hold, per MD order) PT Missed Treatment Reason: Other (Comment) (medical issues with BP and HR)  Pain: Pain Assessment Pain Assessment: No/denies pain Pain Score: 0-No pain  See FIM for current functional status  Therapy/Group: Individual Therapy  Dimitrios Balestrieri 12/14/2013, 11:48 AM

## 2013-12-14 NOTE — Progress Notes (Signed)
Nursing Note: No void this shift. Pt states he needs to void but cannot.Pt scanned and then in and out cathed for 425 cc by Jessica,NT. Pt tolerated well.wbb

## 2013-12-14 NOTE — Evaluation (Signed)
Speech Language Pathology Assessment and Plan  Patient Details  Name: Kevin [name] MRN: [id number] Date of Birth: 1940/11/09  SLP Diagnosis: Cognitive Impairments  Rehab Potential: Good ELOS: 3 weeks   Today's Date: 12/14/2013 SLP Individual Time: 1400-1430 SLP Individual Time Calculation (min): 30 min  Problem List:  Patient Active Problem List   Diagnosis Date Noted  . Syncope 12/14/2013  . Traumatic subdural hemorrhage 12/13/2013  . Ventricular bigeminy 12/12/2013  . Subdural hematoma 12/05/2013  . 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 Subdural hematoma without coma 12/04/2013  . Amputee, 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  . Renal failure 01/11/2012  . Leucocytosis 01/11/2012  . DVT, femoral, acute 01/11/2012  . DVT of axillary vein, acute 01/04/2012  . Acute pulmonary embolism 01/04/2012  . 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 Severe sepsis(995.92) 01/02/2012  . Septic shock(785.52) 01/02/2012  . Hypotension 01/02/2012  . Right ventricular failure 01/02/2012  . 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 HTN (hypertension) 12/31/2011  . CAD (coronary artery disease) 12/31/2011  . CHF (congestive heart failure) 12/31/2011  . Tobacco abuse 12/31/2011  . Hyperlipidemia 12/31/2011   Past Medical History:  Past Medical History  Diagnosis Date  . Arthritis   . Hypertension   . CHF (congestive heart failure)     a. NICM/acute CHF 2007 with EF 10-20%. b. Improved EF 50-55% by echo 12/2011.  Marland Kitchen Hyperlipidemia   . Coronary artery disease     a. NSTEMI 2007 with cath showing mild nonobstructive CAD by cath 2007 (40% mLAD, 40%mRCA) at time of dx of NICM.  Marland Kitchen Tortuous aorta     a. Noted by cath 2007, recommendation for OP [place].  Marland Kitchen BPH (benign prostatic hyperplasia)   . Wide-complex tachycardia     a. Noted 2007.  . Obesity   . Valvular heart disease     a. Mod MR by echo 06/2005 but no significant MR 2013.  Marland Kitchen DVT (deep venous thrombosis)   . Pulmonary embolism    Past Surgical History:   Past Surgical History  Procedure Laterality Date  . Vena cava filter placement  01/03/2012    Procedure: INSERTION VENA-CAVA FILTER;  Surgeon: Rosetta Posner, MD;  Location: [facility];  Service: Vascular;  Laterality: Left;  . Amputation  01/05/2012    Procedure: AMPUTATION ABOVE KNEE;  Surgeon: Rosetta Posner, MD;  Location: [facility];  Service: Vascular;  Laterality: Left;  . Craniotomy Left 12/05/2013    Procedure: CRANIOTOMY HEMATOMA EVACUATION SUBDURAL- LEFT;  Surgeon: Ashok Pall, MD;  Location: [facility];  Service: Neurosurgery;  Laterality: Left;  CRANIOTOMY HEMATOMA EVACUATION SUBDURAL- LEFT    Assessment / Plan / Recommendation Clinical Impression Kevin [name] is a 73 y.o. RH- male with history of HTN, diastolic CHF, CAD--on Xarelto, L-AKA with increased falls recently. He was noted to have cognitive changes and was evaluated in ED on 12/04/13. CT head showed a large isodense subdural on the left frontal and left posterior parietal region with significant mass effect and 17 mm midline shift. He was taken to OR for burr hole for evacuation of chronic left SDH on 12/05/2013 per Dr. Cyndy Freeze. Post op with lethargy and substantial drainage from posterior drain sites and follow up CCT with moderate decrease in size of l-SDH and new foci of extra axial hemorrhage along right parafalcine region. As drainage resolved drains removed and follow up CCT stable with 1 cm evolving L-frontotemporal parenchymal contusion. Lethargy is resolving and patient not anticoagulation candidate due to falls. Therapy ongoing  and patient with difficulty following command, lacks awareness of deficits and working on balance and weight bearing thorough LLE. CIR recommended by MD and therapy team and patient admitted 12/13/13. Orders received; cognitive-linguistic and bedside evaluations initiated; however, limited due to lethargy.  Patient demonstrates severe cognitive impairments impacting focused and sustained attention.  As a  result, all higher level cognitive abilities and basic expression of wants and needs are impaired.  Patient also demonstrates difficulty initiating expression and functional tasks. Patient would benefit from skilled SLP intervention as well as further diagnostic assessment as lethargy resolves, to maximize his functional independence prior to discharge. Anticipate patient will require 24 hour supervision at home and follow up SLP services.    Skilled Therapeutic Interventions          Bedside swallow and cognitive-linguistic evaluation completed with results and recommendations reviewed with patient, who will need reinforcement.    SLP Assessment  Patient will need skilled Speech Lanaguage Pathology Services during CIR admission    Recommendations  Diet Recommendations: Dysphagia 3 (Mechanical Soft);Thin liquid Liquid Administration via: Cup;Straw Medication Administration: Whole meds with puree Supervision: Patient able to self feed;Intermittent supervision to cue for compensatory strategies Compensations: Slow rate;Small sips/bites;Check for pocketing Postural Changes and/or Swallow Maneuvers: Seated upright 90 degrees Oral Care Recommendations: Oral care BID Patient destination: Home Follow up Recommendations: 24 hour supervision/assistance;Home Health SLP Equipment Recommended: None recommended by SLP    SLP Frequency 5 out of 7 days   SLP Treatment/Interventions Cueing hierarchy;Cognitive remediation/compensation;Dysphagia/aspiration precaution training;Environmental controls;Functional tasks;Internal/external aids;Patient/family education;Speech/Language facilitation;Therapeutic Activities    Pain Pain Assessment Pain Assessment: No/denies pain Prior Functioning Cognitive/Linguistic Baseline: Information not available  Short Term Goals: Week 1: SLP Short Term Goal 1 (Week 1): Pt will focus attention to external stimuli with Min assist repeats SLP Short Term Goal 2 (Week 1): Pt  will initiate verbal and functional tasks with Mod assist  SLP Short Term Goal 3 (Week 1): Pt will sustain attention to basic functional tasks for 45-60 seconds with Mod assist  SLP Short Term Goal 4 (Week 1): Pt will utilize external aids for orientation with Mod assist  SLP Short Term Goal 5 (Week 1): Patient will label 2 physical and 1 cognitive change with Mod question cues  See FIM for current functional status Refer to Care Plan for Long Term Goals  Recommendations for other services: None  Discharge Criteria: Patient will be discharged from SLP if patient refuses treatment 3 consecutive times without medical reason, if treatment goals not met, if there is a change in medical status, if patient makes no progress towards goals or if patient is discharged from hospital.  The above assessment, treatment plan, treatment alternatives and goals were discussed and mutually agreed upon: by patient  Gunnar Fusi, M.A., CCC-SLP [phone]  [place] 12/14/2013, 4:33 PM

## 2013-12-14 NOTE — Progress Notes (Signed)
[facility] PHYSICAL MEDICINE & REHABILITATION     PROGRESS NOTE    Subjective/Complaints: Was working with OT this morning, pushing w/c and became unresponsive.  Objective: Vital Signs: Blood pressure 124/81, pulse 84, temperature 98.3 F (36.8 C), temperature source Oral, resp. rate 18, height 5\' 7"  (1.702 m), weight 92.1 kg (203 lb 0.7 oz), SpO2 99.00%. No results found.  Recent Labs  12/14/13 0506  WBC 6.7  HGB 14.2  HCT 43.4  PLT 147*    Recent Labs  12/14/13 0506  NA 131*  K 4.7  CL 92*  GLUCOSE 128*  BUN 31*  CREATININE 2.01*  CALCIUM 8.9   CBG (last 3)  No results found for this basename: GLUCAP,  in the last 72 hours  Wt Readings from Last 3 Encounters:  12/13/13 92.1 kg (203 lb 0.7 oz)  12/13/13 93 kg (205 lb 0.4 oz)  12/13/13 93 kg (205 lb 0.4 oz)    Physical Exam:  HENT: dentition fair Left curvilinear craniotomy site with staples intact. Borderline dentition.  Eyes:  Pupils reactive to light. anicteric  Neck: Neck supple. No thyromegaly present.  Cardiovascular: Regular rhythm. No murmurs  Respiratory: Effort normal and breath sounds normal. No respiratory distress. No rales  GI: Soft. Bowel sounds are normal. He exhibits no distension.  Musculoskeletal:  Edematous left aka stump. RLE with evidence of multiple old healed abrasions on shin and few scabs on right knee.  Neurological:  Flat, more lethargic but coming to.. Makes eye contact. Knows he's at [facility]. Can follow simple commands. Responses are more delayed today. Pupils reactive.  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 Physiatrist is providing close team supervision and 24 hour management of active medical problems listed  below. Physiatrist and rehab team continue to assess barriers to discharge/monitor patient progress toward functional and medical goals. FIM:                   Comprehension Comprehension Mode: Auditory Comprehension: 5-Follows basic conversation/direction: With no assist  Expression Expression Mode: Verbal Expression: 5-Expresses basic needs/ideas: With extra time/assistive device  Social Interaction Social Interaction: 6-Interacts appropriately with others with medication or extra time (anti-anxiety, antidepressant).  Problem Solving Problem Solving: 4-Solves basic 75 - 89% of the time/requires cueing 10 - 24% of the time  Memory Memory: 6-Assistive device: No helper  Medical Problem List and Plan:  1. Functional deficits secondary to L frontoparietal SDH past falls in setting of anticoagulation.  2. DVT Prophylaxis/Anticoagulation: Mechanical: Antiembolism stockings, thigh (TED hose) Bilateral lower extremities  Sequential compression devices, below knee Bilateral lower extremities  3. Chronic pain?/ Headaches/ ain Management: Will continue hydrocodone prn for now.  4. Mood: Monitor for any evidence of anxiety. Question on valium for anxiety at home. LCSW to follow for evaluation and supportive  5. Neuropsych: This patient is capable of making decisions on his own behalf.  6. Skin/Wound Care: Routine pressure relief measures. Monitor for signs of breakdown.  7. Fluids/Electrolytes/Nutrition: Monitor I/O for adequate intake. Offer nutritional supplements if intake poor.  8. CAD with NISCM:Off Xarelto due to falls. Will monitor daily weights for stability as well as signs of overload. Low salt diet. On coreg bid. Resume Lipitor.  9. Seizure prophylaxis: Continue Keppra bid.  10. Hyponatremia: continue to follow sodium  levels  -serial labs--recheck tomorrow  -IV NS 11. Unresponsive episode: showing some improvement  -bedrest this morning  -IVF  -likely related to  pre-renal azotemia  -re-check HCT today as well.  LOS (Days) 1 A FACE TO FACE EVALUATION WAS PERFORMED  SWARTZ,ZACHARY T 12/14/2013 9:16 AM

## 2013-12-15 ENCOUNTER — Inpatient Hospital Stay (HOSPITAL_COMMUNITY): Payer: Medicare Other | Admitting: *Deleted

## 2013-12-15 ENCOUNTER — Inpatient Hospital Stay (HOSPITAL_COMMUNITY): Payer: Medicare Other | Admitting: Physical Therapy

## 2013-12-15 DIAGNOSIS — I369 Nonrheumatic tricuspid valve disorder, unspecified: Secondary | ICD-10-CM

## 2013-12-15 DIAGNOSIS — I519 Heart disease, unspecified: Secondary | ICD-10-CM | POA: Diagnosis present

## 2013-12-15 LAB — URINE CULTURE
Colony Count: NO GROWTH
Culture: NO GROWTH

## 2013-12-15 LAB — BASIC METABOLIC PANEL WITH GFR
Anion gap: 13 (ref 5–15)
BUN: 36 mg/dL — ABNORMAL HIGH (ref 6–23)
CO2: 20 meq/L (ref 19–32)
Calcium: 8.5 mg/dL (ref 8.4–10.5)
Chloride: 96 meq/L (ref 96–112)
Creatinine, Ser: 1.86 mg/dL — ABNORMAL HIGH (ref 0.50–1.35)
GFR calc Af Amer: 40 mL/min — ABNORMAL LOW
GFR calc non Af Amer: 34 mL/min — ABNORMAL LOW
Glucose, Bld: 161 mg/dL — ABNORMAL HIGH (ref 70–99)
Potassium: 3.9 meq/L (ref 3.7–5.3)
Sodium: 129 meq/L — ABNORMAL LOW (ref 137–147)

## 2013-12-15 LAB — TROPONIN I

## 2013-12-15 MED ORDER — TRAMADOL HCL 50 MG PO TABS
50.0000 mg | ORAL_TABLET | Freq: Four times a day (QID) | ORAL | Status: DC | PRN
  Administered 2013-12-15 – 2013-12-18 (×8): 50 mg via ORAL
  Filled 2013-12-15 (×9): qty 1

## 2013-12-15 MED ORDER — CARVEDILOL 3.125 MG PO TABS
3.1250 mg | ORAL_TABLET | Freq: Two times a day (BID) | ORAL | Status: DC
  Filled 2013-12-15 (×4): qty 1

## 2013-12-15 NOTE — Progress Notes (Addendum)
Pt bp 96/55  Pulse 31  md notified   Continue with therapy  Cardiology to see today. Pain 10/10 md aware  Tylenol x2 given

## 2013-12-15 NOTE — Progress Notes (Addendum)
Per dr. Naaman Plummer  Hold Coreg this evening if pulse <50   Ultram ordered for pain rates pain at 10 headache

## 2013-12-15 NOTE — Progress Notes (Addendum)
Physical Therapy Session Note  Patient Details  Name: TYREIK DELAHOUSSAYE MRN: [id number] Date of Birth: Jul 30, 1940  Today's Date: 12/15/2013 Tx Time: 1120-1200 Total Treatment: 40 minutes PT Missed Time: 20 Minutes Missed Time Reason: Patient ill (Comment) (extremely low HR and BP)  Short Term Goals: Week 1:  PT Short Term Goal 1 (Week 1): Pt will roll R and L at mod A level PT Short Term Goal 2 (Week 1): Pt will perform static sitting balance at S level without UE support PT Short Term Goal 3 (Week 1): Pt will perform dynamic sitting balance with single UE support x 2 mins at min A level PT Short Term Goal 4 (Week 1): Pt will perform SB transfers at max A of single therapist  Skilled Therapeutic Interventions/Progress Updates:    PT missed therapy because at start of session pt's HR was 33 bpm according to dynamap and confirmed to be 31bpm via L radial pulse by PT (counted for 1 minute). Also, BP was 96/56 in supine. PT notified RN, who agreed to call MD. MD notified, but instructs RN to have pt still participate in therapy. Pt c/o 10/10 Headache and RN gave pt medication. This PT judges out of bed movement to be too strenuous due to pt's low blood pressure and low HR in supine with HOB elevated, and so bed level exercises completed:  Therapeutic Exercise: PT instructs pt in ROM and gentle strengthening exercises to B LEs: R ankle pumps, R gravity minimized knee flexion/extension, isometric gluteal contractions (1 second on/off), isometric abdominal contractions (1 second on/off), R heel slides, AAROM heel slides, AAROM supine hip abduction/adduction, L hip flexion, L hip abduction, R hip IR/ER, B elbow flexion, B arm flexion to 90 degrees with elbow extension: 3 x 10 reps each.   Pt is very fatigued throughout PT session, today. Rest breaks given as needed and HR monitored continuously with dynamap throughout PT session. PT makes conversation with pt throughout session and pt agreeable to  participate as he is able.   Therapy Documentation Precautions:  Precautions Precautions: Fall Precaution Comments: Pt with Lt AKA; wears prosthesis, not in room, burr hole evacuation Required Braces or Orthoses: Other Brace/Splint (rarely uses prosthesis) Other Brace/Splint: prosthesis Restrictions Weight Bearing Restrictions: No General: PT Amount of Missed Time (min): 60 Minutes PT Missed Treatment Reason: Patient ill (Comment) (extremely low HR and BP) Vital Signs: Therapy Vitals Pulse Rate: 31 BP: 96/56 mmHg Patient Position (if appropriate): Lying Oxygen Therapy SpO2: 98 % O2 Device: None (Room air) Pain: Pain Assessment Pain Assessment: 0-10 Pain Score: 10-Worst pain ever Pain Type: Acute pain;Surgical pain Pain Location: Head Pain Orientation: Left Pain Descriptors / Indicators: Aching Pain Intervention(s): Medication (See eMAR);RN made aware Multiple Pain Sites: No  See FIM for current functional status  Therapy/Group: Individual Therapy  Kourtlynn Trevor M 12/15/2013, 11:13 AM

## 2013-12-15 NOTE — Progress Notes (Addendum)
Patient ID: Kevin [name], male   DOB: October 25, 1940, 73 y.o.   MRN: [id number]     Primary cardiologist:  Subjective:      Objective:   Temp:  [98.1 F (36.7 C)-98.5 F (36.9 C)] 98.4 F (36.9 C) (10/03 0628) Pulse Rate:  [31-78] 31 (10/03 1112) Resp:  [19-20] 19 (10/03 0628) BP: (96-122)/(56-83) 96/56 mmHg (10/03 1112) SpO2:  [97 %-100 %] 98 % (10/03 1112) Weight:  [210 lb 15.7 oz (95.7 kg)] 210 lb 15.7 oz (95.7 kg) (10/03 0628) Last BM Date: 12/14/13  Filed Weights   12/13/13 1517 12/13/13 1530 12/15/13 0628  Weight: 203 lb 0.7 oz (92.1 kg) 203 lb 0.7 oz (92.1 kg) 210 lb 15.7 oz (95.7 kg)    Intake/Output Summary (Last 24 hours) at 12/15/13 1352 Last data filed at 12/15/13 0800  Gross per 24 hour  Intake    480 ml  Output    550 ml  Net    -70 ml    Telemetry:  Exam:  General:NAD  Resp: CTAB  Cardiac: RRR, no m/r/g, no JVD  GI: abdomen soft, NT , ND  MSK: no LE edema  Psych: appropriate affect  Lab Results:  Basic Metabolic Panel:  Recent Labs Lab 12/10/13 0258 12/11/13 1326 12/14/13 0506 12/15/13 1010  NA 135*  --  131* 129*  K 4.3  --  4.7 3.9  CL 97  --  92* 96  CO2 27  --  23 20  GLUCOSE 84  --  128* 161*  BUN 18  --  31* 36*  CREATININE 1.14  --  2.01* 1.86*  CALCIUM 8.6  --  8.9 8.5  MG 1.9 2.1  --   --     Liver Function Tests:  Recent Labs Lab 12/14/13 0506  AST 16  ALT 17  ALKPHOS 66  BILITOT 0.7  PROT 7.6  ALBUMIN 3.3*    CBC:  Recent Labs Lab 12/10/13 0258 12/14/13 0506  WBC 5.4 6.7  HGB 13.7 14.2  HCT 42.9 43.4  MCV 84.3 83.5  PLT 141* 147*    Cardiac Enzymes:  Recent Labs Lab 12/14/13 1235 12/14/13 1848 12/15/13 0106  TROPONINI <0.30 <0.30 <0.30    BNP: No results found for this basename: PROBNP,  in the last 8760 hours  Coagulation: No results found for this basename: INR,  in the last 168 hours  ECG:   Medications:   Scheduled Medications: . atorvastatin  20 mg Oral q1800  .  carvedilol  6.25 mg Oral BID WC  . cephALEXin  250 mg Oral 3 times per day  . levETIRAcetam  500 mg Oral BID  . pantoprazole  40 mg Oral QHS  . senna-docusate  2 tablet Oral QHS  . tamsulosin  0.4 mg Oral QPC supper     Infusions:     PRN Medications:  acetaminophen, alum & mag hydroxide-simeth, bisacodyl, diphenhydrAMINE, guaiFENesin-dextromethorphan, naLOXone (NARCAN)  injection, ondansetron (ZOFRAN) IV, ondansetron, traMADol, traZODone    12/15/13 Echo Study Conclusions  - Technical notes: Technically difficult and suboptimal study. - Left ventricle: Unable to obtain on axis measurements - appears to be at least mild LVH. Systolic function was severely reduced. The estimated ejection fraction was in the range of 20% to 25%. Unusual wall motion, possibly an intermittent ventricular arrhythmia. - Left atrium: The atrium was normal in size. - Right ventricle: Poorly visualized. Systolic function was normal. - Pulmonary arteries: PA peak pressure: 35 mm Hg (S). - Pericardium, extracardiac: Anterior  echo free space which could represent a fat pad or loculated effusion.  Impressions:  - Compared to the prior echo in 2013, there has been a marked reduction in LV systolic function.    Assessment/Plan   73 yo male hx of HTN, HL, CAD, DVT/PE admitted 12/04/13 with neurolgical changes and found to have large isodense subdural hematoma, he was on xarelto at that time. Noted to have frequent falls at home. Hematoma was evuated 12/05/13.    1. Syncope - patient is poor historian. From records he has had multiple falls at home. He is not able to describe any associated symotoms. - episode 12/14/13 AM while working with PT, patient became transiently unresponsive. He was not on tele at the time. BP checked and low at 80s/50s. Patient himself does not remember episode. Initial cardiac evaluation noted elevated Cr, though possibly due to hypovolemia, a repeat echo was ordered. He also is  hyponatremic with elevated BUN supporting hypovolemia. CT head stable - repeat echo shows decreased LVEF 20-25%. From notes tele and EKG  has previously shown ventricular bigeminy but no sustained rhythms.  - pulse checks show low HRs at times, not detecting PVCs.  - will ask EP to evaluate patient in setting of syncope and decreased LVEF  2. Systolic Dysfunction - echo shows LVEF20-25%, significant drop from prior echo 12/2011. From notes he had previous severe LV systolic dysfunction as far back as 2007 that later normalized. Cath in 2007 showed non-obstructive disease.  - EKG shows SR with chronic but more pronounced ST depressions lateral leads. He denies any chest pain, no SOB. - continue coreg. He has AKI, hold on ACE-I - conservative management at this time as he recovers from his recent head bleed, potential in the future for repeat heart cath in setting of ef drop. EKG with ST depressions, troponins negative x 3, no chest pain.   3. Subdural hematoma - patient with multiple falls at home, was on xarelto at the time. S/p surgical evacuation, currently in rehab.       Carlyle Dolly, M.D.,

## 2013-12-15 NOTE — Progress Notes (Signed)
[place] PHYSICAL MEDICINE & REHABILITATION     PROGRESS NOTE    Subjective/Complaints: Up in bed. No new complaints. Denies headache. Able to sleep some last night.   Objective: Vital Signs: Blood pressure 122/77, pulse 71, temperature 98.4 F (36.9 C), temperature source Oral, resp. rate 19, height 5\' 7"  (1.702 m), weight 95.7 kg (210 lb 15.7 oz), SpO2 100.00%. Ct Head Wo Contrast  12/14/2013   CLINICAL DATA:  Left-sided subdural hematoma.  EXAM: CT HEAD WITHOUT CONTRAST  TECHNIQUE: Contiguous axial images were obtained from the base of the skull through the vertex without intravenous contrast.  COMPARISON:  CT scan of December 10, 2013.  FINDINGS: Two surgical burr holes are again noted involving the left frontal and parietal skull. Surgical staples are again noted. Paranasal sinuses appear normal. Soft tissue gas is seen in the left temporal scalp region as well as postoperative pneumocephalus seen in the left frontal region which is decreased compared to prior exam. Stable parafalcine and left tentorial subdural hematoma is noted. Ventricular size is within normal limits. 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 There remains approximately 7 mm of left to right midline shift which is not significantly changed compared to prior exam. The evolving contusion in the left temporal lobe seen on prior exam is not well visualized currently. No acute infarction is noted.  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 Stable left to right midline shift of 7 mm is noted. Stable small parafalcine and left tentorial subdural hematoma is noted   Electronically Signed   By: Sabino Dick M.D.   On: 12/14/2013 10:43    Recent Labs  12/14/13 0506  WBC 6.7  HGB 14.2  HCT 43.4  PLT 147*    Recent Labs  12/14/13 0506  NA 131*  K 4.7  CL 92*  GLUCOSE 128*  BUN 31*   CREATININE 2.01*  CALCIUM 8.9   CBG (last 3)  No results found for this basename: GLUCAP,  in the last 72 hours  Wt Readings from Last 3 Encounters:  12/15/13 95.7 kg (210 lb 15.7 oz)  12/13/13 93 kg (205 lb 0.4 oz)  12/13/13 93 kg (205 lb 0.4 oz)    Physical Exam:  HENT: dentition fair Left curvilinear craniotomy site with staples intact. Borderline dentition--missing numerous teeth.  Eyes:  Pupils reactive to light. anicteric  Neck: Neck supple. No thyromegaly present.  Cardiovascular: Regular rhythm. No murmurs  Respiratory: Effort normal and breath sounds normal. No respiratory distress. No rales  GI: Soft. Bowel sounds are normal. He exhibits no distension.  Musculoskeletal:  Edematous left aka stump. RLE with evidence of multiple old healed abrasions on shin and few scabs on right knee.  Neurological:  Flat, but more alert. Makes eye contact with cues. Can follow simple commands.  Pupils reactive. RUE and RLE 3- to 3/5.  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 Physiatrist is providing close team supervision and 24 hour management of active medical problems listed below. Physiatrist and rehab team continue to assess barriers to discharge/monitor patient progress toward functional and medical goals. FIM:             FIM - Control and instrumentation engineer  Devices: Arm rests Bed/Chair Transfer: 1: Two helpers;2: Supine > Sit: Max A (lifting assist/Pt. 25-49%)  FIM - Locomotion: Wheelchair Locomotion: Wheelchair: 0: Activity did not occur FIM - Locomotion: Ambulation Locomotion: Ambulation: 0: Activity did not occur  Comprehension Comprehension Mode: Auditory Comprehension: 2-Understands basic 25 -  49% of the time/requires cueing 51 - 75% of the time  Expression Expression Mode: Verbal Expression: 2-Expresses basic 25 - 49% of the time/requires cueing 50 - 75% of the time. Uses single words/gestures.  Social Interaction Social Interaction: 2-Interacts appropriately 25 - 49% of time - Needs frequent redirection.  Problem Solving Problem Solving: 2-Solves basic 25 - 49% of the time - needs direction more than half the time to initiate, plan or complete simple activities  Memory Memory: 3-Recognizes or recalls 50 - 74% of the time/requires cueing 25 - 49% of the time  Medical Problem List and Plan:  1. Functional deficits secondary to L frontoparietal SDH past falls in setting of anticoagulation.  2. DVT Prophylaxis/Anticoagulation: Mechanical: Antiembolism stockings, thigh (TED hose) Bilateral lower extremities  Sequential compression devices, below knee Bilateral lower extremities  3. Chronic pain?/ Headaches/ ain Management: Will continue hydrocodone prn for now.  4. Mood: Monitor for any evidence of anxiety. Question on valium for anxiety at home. LCSW to follow for evaluation and supportive  5. Neuropsych: This patient is capable of making decisions on his own behalf.  6. Skin/Wound Care: Routine pressure relief measures. Monitor for signs of breakdown.  7. Fluids/Electrolytes/Nutrition: Monitor I/O for adequate intake. Offer nutritional supplements if intake poor.  8. CAD with NISCM:Off Xarelto due to falls. Will monitor daily weights for stability as well as signs of overload. Low salt diet. On coreg bid. Resume Lipitor.  9. Seizure prophylaxis: Continue Keppra bid.  10. Hyponatremia: likely central, may some volume depletion  -serial labs--recheck today    11. Unresponsive episode: likely volume depletion. repsonded to IVF  -appreciate Dr. Letha Cape help. Echo ordered  -encourage adequate PO intake  -re-check bmet today  -HCT yesterday was stable.  -pt appears to be  back at neuro baseline today.  LOS (Days) 2 A FACE TO FACE EVALUATION WAS PERFORMED  Amberlee Garvey T 12/15/2013 8:54 AM

## 2013-12-15 NOTE — IPOC Note (Signed)
Overall Plan of Care [facility]) Patient Details Name: Kevin [name] MRN: [id number] DOB: May 24, 1940  Admitting Diagnosis: traumatic sdh  Hospital Problems: Active Problems:   Traumatic subdural hemorrhage   Syncope     Functional Problem List: Nursing Bowel;Medication Management;Skin Integrity;Pain;Safety  PT Balance;Endurance;Motor;Safety  OT    SLP Cognition;Behavior;Linguistic;Nutrition;Safety  TR         Basic ADL's: OT       Advanced  ADL's: OT       Transfers: PT Bed Mobility;Bed to Chair;Car;Furniture  OT       Locomotion: PT Wheelchair Mobility     Additional Impairments: OT    SLP Swallowing;Communication;Social Cognition comprehension;expression Social Interaction;Problem Solving;Memory;Attention;Awareness  TR      Anticipated Outcomes Item Anticipated Outcome  Self Feeding    Swallowing  Supervision    Basic self-care    supervision to min assist  Toileting      Bathroom Transfers    Bowel/Bladder  continent of bowel and bladder. Per report LBM 9/29  Transfers  S level  Locomotion  S from w/c, no functional ambulation anticipated  Communication  Min assist   Cognition  Min assist   Pain  <4  Safety/Judgment  Minimal assist   Therapy Plan: PT Intensity: Minimum of 1-2 x/day ,45 to 90 minutes PT Frequency: 5 out of 7 days PT Duration Estimated Length of Stay: 21 days   SLP Intensity: Minumum of 1-2 x/day, 30 to 90 minutes SLP Frequency: 5 out of 7 days SLP Duration/Estimated Length of Stay: 3 weeks       Team Interventions: Nursing Interventions Patient/Family Education;Bowel Management;Pain Management;Medication Management;Skin Care/Wound Management;Disease Management/Prevention  PT interventions Balance/vestibular training;Discharge planning;Disease Editor, commissioning;Functional mobility training;Neuromuscular re-education;Patient/family education;Stair training;Therapeutic  Activities;Therapeutic Exercise;UE/LE Strength taining/ROM;UE/LE Coordination activities;Visual/perceptual remediation/compensation;Wheelchair propulsion/positioning  OT Interventions    SLP Interventions Cueing hierarchy;Cognitive remediation/compensation;Dysphagia/aspiration precaution training;Environmental controls;Functional tasks;Internal/external aids;Patient/family education;Speech/Language facilitation;Therapeutic Activities  TR Interventions    SW/CM Interventions Discharge Planning;Psychosocial Support;Patient/Family Education    Team Discharge Planning: Destination: PT-Home ,OT-   , SLP-Home Projected Follow-up: PT-Home health PT;24 hour supervision/assistance, OT-   , SLP-24 hour supervision/assistance;Home Health SLP Projected Equipment Needs: PT-To be determined, OT-  , SLP-None recommended by SLP Equipment Details: PT- , OT-  Patient/family involved in discharge planning: PT- Patient,  OT- , SLP-Patient unable/family or caregive not available  MD ELOS: 21 days Medical Rehab Prognosis:  Excellent Assessment: The patient has been admitted for CIR therapies with the diagnosis of SDH. The team will be addressing functional mobility, strength, stamina, balance, safety, adaptive techniques and equipment, self-care, bowel and bladder mgt, patient and caregiver education, NMR, cognition, communication. Goals have been set at supervision to min assist at w/c level.    Meredith Staggers, MD, FAAPMR      See Team Conference Notes for weekly updates to the plan of care

## 2013-12-15 NOTE — Progress Notes (Addendum)
Physical Therapy Session Note  Patient Details  Name: Kevin [name] MRN: [id number] Date of Birth: Jan 03, 1941  Skilled Therapeutic Interventions/Progress Updates:  Attempted make-up session due to missed minutes earlier in day with physical therapy. Pt politely declining due to continued severity of headache, stating "It's as high as it can be. I really don't feel like I can do anything right now, I'm sorry." Pt did not specify formal pain rating. Pt left with all needs in reach, bed alarm on and RN aware.   Therapy Documentation Precautions:  Precautions Precautions: Fall Precaution Comments: Pt with Lt AKA; wears prosthesis, not in room, burr hole evacuation Required Braces or Orthoses: Other Brace/Splint Other Brace/Splint: prosthesis Restrictions Weight Bearing Restrictions: No Other Position/Activity Restrictions: Son reports pt. stopped using prosthesis and began using wheelchair as primary mod of mobility due to pain in shoulders while using RW  See FIM for current functional status  Therapy/Group: Individual Therapy  Gilmore Laroche 12/15/2013, 4:41 PM

## 2013-12-15 NOTE — Progress Notes (Signed)
Echocardiogram 2D Echocardiogram has been performed.  Kevin [name] 12/15/2013, 9:44 AM

## 2013-12-15 NOTE — Progress Notes (Signed)
Speech Language Pathology Daily Session Note  Patient Details  Name: Kevin [name] MRN: [id number] Date of Birth: February 11, 1941  Today's Date: 12/15/2013 SLP Individual Time: 1505-1520 SLP Individual Time Calculation (min): 15 min  Short Term Goals: Week 1: SLP Short Term Goal 1 (Week 1): Pt will focus attention to external stimuli with Min assist repeats SLP Short Term Goal 2 (Week 1): Pt will initiate verbal and functional tasks with Mod assist  SLP Short Term Goal 3 (Week 1): Pt will sustain attention to basic functional tasks for 45-60 seconds with Mod assist  SLP Short Term Goal 4 (Week 1): Pt will utilize external aids for orientation with Mod assist  SLP Short Term Goal 5 (Week 1): Patient will label 2 physical and 1 cognitive change with Mod question cues  Skilled Therapeutic Interventions: Skilled treatment session focused on addressing cognition goals.  SLP facilitated session with discussion regarding intellectual awareness of current deficits patient reported pain from "blood clot in his head" but appeared to perseverate on pain and required Total assist to identify any physical or cognitive change.  SLP then facilitated session with Max assist cues to locate and problem solve use of call bell to request pain medication.  Patient with increased ability to follow commands and express needs during brief periods of sustained attention; pain and attentional deficits appear to be most limiting factors at this time.  SLP follow up with RN regarding pain medication and she reported that MD has not written for any additional pain meds.   Continue with current plan of care.   FIM:  Comprehension Comprehension Mode: Auditory Comprehension: 2-Understands basic 25 - 49% of the time/requires cueing 51 - 75% of the time Expression Expression Mode: Verbal Expression: 2-Expresses basic 25 - 49% of the time/requires cueing 50 - 75% of the time. Uses single words/gestures. Social  Interaction Social Interaction: 2-Interacts appropriately 25 - 49% of time - Needs frequent redirection. Problem Solving Problem Solving: 2-Solves basic 25 - 49% of the time - needs direction more than half the time to initiate, plan or complete simple activities Memory Memory: 3-Recognizes or recalls 50 - 74% of the time/requires cueing 25 - 49% of the time  Pain Pain Assessment Pain Assessment: 0-10 Pain Score: 8  Pain Type: Acute pain Pain Location: Head Pain Orientation: Left Pain Descriptors / Indicators: Aching Pain Onset: On-going Pain Intervention(s): RN made aware Multiple Pain Sites: No  Therapy/Group: Individual Therapy  Carmelia Roller., [place] [phone]  [place] 12/15/2013, 3:30 PM

## 2013-12-15 NOTE — Evaluation (Signed)
Occupational Therapy Assessment and Plan  Patient Details  Name: Kevin [name] MRN: [id number] Date of Birth: 19-Jan-1941  OT Diagnosis: abnormal posture, acute pain and cognitive deficits Rehab Potential: Rehab Potential: Good ELOS: 3-4 weeks   Today's Date: 12/15/2013 OT Individual Time:   0830-0930  (71mn)  1st session:    1400-1500  (60 min)  2nd session OT Individual Time Calculation (min): 60 min   1st session:   60 min   2nd session)  Problem List:  Patient Active Problem List   Diagnosis Date Noted  . Systolic dysfunction 116/60/6301 . Syncope 12/14/2013  . Traumatic subdural hemorrhage 12/13/2013  . Ventricular bigeminy 12/12/2013  . Subdural hematoma 12/05/2013  . 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 Subdural hematoma without coma 12/04/2013  . Amputee, 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  . Renal failure 01/11/2012  . Leucocytosis 01/11/2012  . DVT, femoral, acute 01/11/2012  . DVT of axillary vein, acute 01/04/2012  . Acute pulmonary embolism 01/04/2012  . 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 Severe sepsis(995.92) 01/02/2012  . Septic shock(785.52) 01/02/2012  . Hypotension 01/02/2012  . Right ventricular failure 01/02/2012  . 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 HTN (hypertension) 12/31/2011  . CAD (coronary artery disease) 12/31/2011  . CHF (congestive heart failure) 12/31/2011  . Tobacco abuse 12/31/2011  . Hyperlipidemia 12/31/2011    Past Medical History:  Past Medical History  Diagnosis Date  . Arthritis   . Hypertension   . CHF (congestive heart failure)     a. NICM/acute CHF 2007 with EF 10-20%. b. Improved EF 50-55% by echo 12/2011.  .Marland KitchenHyperlipidemia   . Coronary artery disease     a. NSTEMI 2007 with cath showing mild nonobstructive CAD by cath 2007 (40% mLAD, 40%mRCA) at time of dx of NICM.  .Marland KitchenTortuous aorta     a. Noted by cath 2007, recommendation for OP [place]  .Marland KitchenBPH (benign prostatic hyperplasia)   . Wide-complex tachycardia     a. Noted 2007.  .  Obesity   . Valvular heart disease     a. Mod MR by echo 06/2005 but no significant MR 2013.  .Marland KitchenDVT (deep venous thrombosis)   . Pulmonary embolism    Past Surgical History:  Past Surgical History  Procedure Laterality Date  . Vena cava filter placement  01/03/2012    Procedure: INSERTION VENA-CAVA FILTER;  Surgeon: Kevin Posner MD;  [facility]  Service: Vascular;  Laterality: Left;  . Amputation  01/05/2012    Procedure: AMPUTATION ABOVE KNEE;  Surgeon: Kevin Posner MD;  [place] [facility]  Service: Vascular;  Laterality: Left;  . Craniotomy Left 12/05/2013    Procedure: CRANIOTOMY HEMATOMA EVACUATION SUBDURAL- LEFT;  Surgeon: Kevin Pall MD;  [facility];  Service: Neurosurgery;  Laterality: Left;  CRANIOTOMY HEMATOMA EVACUATION SUBDURAL- LEFT    Assessment & Plan Clinical Impression:. Kevin YONKERSis a 73y.o. RH- male with H/o HTN, diastolic CHF, CAD--on Xarelto, L-AKA with SB transfers and increased falls recently. He was noted to have cognitive changes and was evaluated in ED on 12/04/13. CT head showed a large isodense subdural on the left frontal and left posterior parietal region with significant mass effect and 17 mm midline shift. He was taken to OR for burr hole for evacuation of chronic left SDH on 12/05/2013 per Dr. CCyndy [name] Post op with lethargy and substantial drainage from posterior drain sites and follow up CCT with moderate decrease in size of l-SDH and  new foci of extra axial hemorrhage along right parafalcine region. As drainage resolved drains removed and follow up CCT stable with 1 cm evolving L-frontotemporal parenchymal contusion. Lethargy is resolving and patient not anticoagulation candidate due to falls. Has h/o DVT/PE with IVC filter placed 2013. Dr. Angelena [name] consulted for input on ventricular bigeminy and recommended continuing BB as electrolytes stable and follow up on outpatient basis. Cardiac enzymes negative. Therapy ongoing and patient with  difficulty following command, lacks awareness of deficits and working on balance and weight bearing thorough LLE  Patient transferred to CIR on 12/13/2013 .    Patient currently requires max with basic self-care skills secondary to muscle weakness and muscle joint tightness, decreased cardiorespiratoy endurance and decreased oxygen support and decreased initiation, decreased problem solving, decreased safety awareness and delayed processing.  Prior to hospitalization, patient could complete BADL with supervision.  Patient will benefit from skilled intervention to decrease level of assist with basic self-care skills and increase independence with basic self-care skills prior to discharge home with care partner.  Anticipate patient will require 24 hour supervision and follow up home health.  OT - End of Session Activity Tolerance: Tolerates < 10 min activity, no significant change in vital signs Endurance Deficit: Yes Endurance Deficit Description: Pt. wanted to lie back in bed due to HA. OT Assessment Rehab Potential: Good Barriers to Discharge:  (none noted) OT Patient demonstrates impairments in the following area(s): Balance;Cognition;Edema;Endurance;Motor;Pain;Safety OT Basic ADL's Functional Problem(s): Grooming;Bathing;Dressing;Toileting OT Transfers Functional Problem(s): Toilet;Tub/Shower OT Additional Impairment(s): None OT Plan OT Intensity: Minimum of 1-2 x/day, 45 to 90 minutes OT Frequency: 5 out of 7 days OT Duration/Estimated Length of Stay: 3-4 weeks OT Treatment/Interventions: Balance/vestibular training;Community reintegration;Discharge planning;DME/adaptive equipment instruction;Functional mobility training;Neuromuscular re-education;Pain management;Patient/family education;Self Care/advanced ADL retraining;Therapeutic Activities;Therapeutic Exercise;UE/LE Strength taining/ROM;UE/LE Coordination activities;Wheelchair propulsion/positioning OT Self Feeding Anticipated  Outcome(s): independence OT Basic Self-Care Anticipated Outcome(s): Supervision OT Toileting Anticipated Outcome(s): supervision OT Bathroom Transfers Anticipated Outcome(s): minimal assist OT Recommendation Patient destination: Home Follow Up Recommendations: Home health OT Equipment Recommended: To be determined   Skilled Therapeutic Intervention 2nd session:  Addressed bed mobility, head ache pain with positioning and gentle massage.  Explained pt POC and goals.    OT Evaluation Precautions/Restrictions  Precautions Precautions: Fall Precaution Comments: Pt with Lt AKA; wears prosthesis, not in room, burr hole evacuation Required Braces or Orthoses: Other Brace/Splint Other Brace/Splint: prosthesis Restrictions Weight Bearing Restrictions: No Other Position/Activity Restrictions: Son reports pt. stopped using prosthesis and began using wheelchair as primary mod of mobility due to pain in shoulders while using RW       Pain Pain Assessment Pain Assessment: 0-10 Pain Score: 8  Pain Type: Surgical pain Pain Location: Head Pain Orientation: Left Pain Descriptors / Indicators: Aching Pain Onset: On-going Pain Intervention(s): Medication (See eMAR) Multiple Pain Sites: No Home Living/Prior Functioning Home Living Available Help at Discharge: Family;Available 24 hours/day Type of Home: Mobile home Home Access: Stairs to enter Entrance Stairs-Number of Steps: 3 Entrance Stairs-Rails: None Home Layout: One level Additional Comments: no family present to confirm history  Lives With: Son IADL History Homemaking Responsibilities: No Current License: No Mode of Transportation: Family Prior Function Level of Independence: Independent with transfers;Requires assistive device for independence;Needs assistance with tranfers  Able to Take Stairs?: No Driving: No Leisure: Hobbies-yes (Comment) Comments: Pt states that if he needed help getting into shower, he would get family  to assist, states he has not worn prosthetic leg in 4 months    Vision/Perception  Vision- Assessment Eye Alignment: Within Functional Limits Perception Perception: Within Functional Limits  Cognition Overall Cognitive Status: Impaired/Different from baseline Arousal/Alertness: Lethargic Orientation Level: Oriented to person;Oriented to place Attention: Sustained Focused Attention: Impaired Focused Attention Impairment: Verbal basic;Functional basic Sustained Attention: Impaired Sustained Attention Impairment: Verbal basic;Functional basic Memory: Impaired Memory Impairment: Retrieval deficit;Decreased recall of new information Awareness: Impaired Awareness Impairment: Intellectual impairment Problem Solving: Impaired Problem Solving Impairment: Verbal basic;Functional basic Executive Function: Reasoning;Initiating Reasoning: Impaired Reasoning Impairment: Verbal complex;Functional complex Initiating: Impaired Initiating Impairment: Verbal basic;Functional basic Behaviors: Lability Safety/Judgment: Impaired Comments:  (Pt. had HA pain 8/10) Sensation Sensation Light Touch: Appears Intact Proprioception: Appears Intact Coordination Gross Motor Movements are Fluid and Coordinated: Yes Fine Motor Movements are Fluid and Coordinated: Yes Motor  Motor Motor: Abnormal postural alignment and control (leans to right in sitting) Motor - Skilled Clinical Observations: R sided weakness, decreased postural control in sitting Mobility  Bed Mobility Bed Mobility: Rolling Left;Left Sidelying to Sit Rolling Left: 3: Mod assist Rolling Left Details: Verbal cues for sequencing;Verbal cues for technique;Verbal cues for precautions/safety;Manual facilitation for weight shifting;Manual facilitation for placement Left Sidelying to Sit: 2: Max assist Left Sidelying to Sit Details: Verbal cues for sequencing;Verbal cues for technique;Verbal cues for precautions/safety;Manual facilitation for  weight shifting;Manual facilitation for weight bearing;Manual facilitation for placement  Trunk/Postural Assessment  Cervical Assessment Cervical Assessment: Exceptions to WFL Cervical AROM Overall Cervical AROM Comments: decreased AROM to left Cervical Strength Overall Cervical Strength Comments: keeps head turned to the R Thoracic Assessment Thoracic Assessment: Exceptions to WFL Thoracic AROM Overall Thoracic AROM: Deficits Thoracic Strength Overall Thoracic Strength Comments: forward flexed posture, rounded shoulders Lumbar Assessment Lumbar Assessment: Exceptions to WFL Lumbar Strength Overall Lumbar Strength Comments: pt sits with posterior pelvic tilt Postural Control Postural Control: Deficits on evaluation Protective Responses: Poor Postural Limitations: Pt tends to lean far to the R when sitting at EOB.   Balance Balance Balance Assessed: Yes Static Sitting Balance Static Sitting - Balance Support: Left upper extremity supported;Feet unsupported Static Sitting - Level of Assistance: 2: Max assist Dynamic Sitting Balance Dynamic Sitting - Balance Activities: Lateral lean/weight shifting Sitting balance - Comments: Pt able to sit at EOB x 2-3 mins at min/mod A level with RUE support, but would tend to keep R hand extremely laterally and posteriorly Extremity/Trunk Assessment RUE Assessment RUE Assessment: Within Functional Limits LUE Assessment LUE Assessment: Within Functional Limits  FIM:  FIM - Eating Eating Activity: 4: Help with managing cup/glass;6: More than reasonable amount of time FIM - Grooming Grooming Steps: Wash, rinse, dry face;Wash, rinse, dry hands Grooming: 3: Patient completes 2 of 4 or 3 of 5 steps FIM - Bathing Bathing Steps Patient Completed: Chest;Abdomen;Left Arm;Right Arm Bathing: 2: Max-Patient completes 3-4 0f 10 parts or 25-49% FIM - Upper Body Dressing/Undressing Upper body dressing/undressing: 0: Wears gown/pajamas-no public  clothing FIM - Lower Body Dressing/Undressing Lower body dressing/undressing: 0: Wears gown/pajamas-no public clothing FIM - Toileting Toileting: 0: Activity did not occur FIM - Bed/Chair Transfer Bed/Chair Transfer: 0: Activity did not occur FIM - Toilet Transfers Toilet Transfers: 0-Activity did not occur FIM - Tub/Shower Transfers Tub/shower Transfers: 0-Activity did not occur or was simulated   Refer to Care Plan for Long Term Goals  Recommendations for other services: None  Discharge Criteria: Patient will be discharged from OT if patient refuses treatment 3 consecutive times without medical reason, if treatment goals not met, if there is a change in medical status, if patient makes no progress towards goals   or if patient is discharged from hospital.  The above assessment, treatment plan, treatment alternatives and goals were discussed and mutually agreed upon: by patient  Edwards, Elizabeth J 12/15/2013, 6:59 PM  

## 2013-12-15 NOTE — Plan of Care (Signed)
Problem: RH PAIN MANAGEMENT Goal: RH STG PAIN MANAGED AT OR BELOW PT'S PAIN GOAL Pain<4  Outcome: Not Progressing Patient rates pain 6-8

## 2013-12-16 ENCOUNTER — Inpatient Hospital Stay (HOSPITAL_COMMUNITY): Payer: Medicare Other | Admitting: Physical Therapy

## 2013-12-16 LAB — BASIC METABOLIC PANEL
ANION GAP: 11 (ref 5–15)
BUN: 32 mg/dL — ABNORMAL HIGH (ref 6–23)
CALCIUM: 8.6 mg/dL (ref 8.4–10.5)
CHLORIDE: 96 meq/L (ref 96–112)
CO2: 22 mEq/L (ref 19–32)
Creatinine, Ser: 1.53 mg/dL — ABNORMAL HIGH (ref 0.50–1.35)
GFR, EST AFRICAN AMERICAN: 50 mL/min — AB (ref >90)
GFR, EST NON AFRICAN AMERICAN: 43 mL/min — AB (ref >90)
Glucose, Bld: 101 mg/dL — ABNORMAL HIGH (ref 70–99)
Potassium: 4.8 mEq/L (ref 3.7–5.3)
Sodium: 129 mEq/L — ABNORMAL LOW (ref 137–147)

## 2013-12-16 MED ORDER — CARVEDILOL 3.125 MG PO TABS
3.1250 mg | ORAL_TABLET | Freq: Two times a day (BID) | ORAL | Status: DC
  Administered 2013-12-16 – 2013-12-19 (×6): 3.125 mg via ORAL
  Filled 2013-12-16 (×11): qty 1

## 2013-12-16 NOTE — Progress Notes (Addendum)
[facility]     PROGRESS NOTE    Subjective/Complaints: Up this morning. Having some headache. Tramadol seemed help somewhat.   Objective: Vital Signs: Blood pressure 133/63, pulse 39, temperature 98.7 F (37.1 C), temperature source Oral, resp. rate 19, height 5\' 7"  (1.702 m), weight 95.4 kg (210 lb 5.1 oz), SpO2 99.00%. Ct Head Wo Contrast  12/14/2013   CLINICAL DATA:  Left-sided subdural hematoma.  EXAM: CT HEAD WITHOUT CONTRAST  TECHNIQUE: Contiguous axial images were obtained from the base of the skull through the vertex without intravenous contrast.  COMPARISON:  CT scan of December 10, 2013.  FINDINGS: Two surgical burr holes are again noted involving the left frontal and parietal skull. Surgical staples are again noted. Paranasal sinuses appear normal. Soft tissue gas is seen in the left temporal scalp region as well as postoperative pneumocephalus seen in the left frontal region which is decreased compared to prior exam. Stable parafalcine and left tentorial subdural hematoma is noted. Ventricular size is within normal limits. 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 There remains approximately 7 mm of left to right midline shift which is not significantly changed compared to prior exam. The evolving contusion in the left temporal lobe seen on prior exam is not well visualized currently. No acute infarction is noted.  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 Stable left to right midline shift of 7 mm is noted. Stable small parafalcine and left tentorial subdural hematoma is noted   Electronically Signed   By: Sabino Dick M.D.   On: 12/14/2013 10:43    Recent Labs  12/14/13 0506  WBC 6.7  HGB 14.2  HCT 43.4  PLT 147*    Recent Labs  12/14/13 0506 12/15/13 1010  NA 131* 129*  K 4.7 3.9  CL 92* 96  GLUCOSE  128* 161*  BUN 31* 36*  CREATININE 2.01* 1.86*  CALCIUM 8.9 8.5   CBG (last 3)  No results found for this basename: GLUCAP,  in the last 72 hours  Wt Readings from Last 3 Encounters:  12/16/13 95.4 kg (210 lb 5.1 oz)  12/13/13 93 kg (205 lb 0.4 oz)  12/13/13 93 kg (205 lb 0.4 oz)    Physical Exam:  HENT: dentition fair Left curvilinear craniotomy site with staples intact. Borderline dentition--missing numerous teeth.  Eyes:  Pupils reactive to light. anicteric  Neck: Neck supple. No thyromegaly present.  Cardiovascular: Regular rhythm. No murmurs  Respiratory: Effort normal and breath sounds normal. No respiratory distress. No rales  GI: Soft. Bowel sounds are normal. He exhibits no distension.  Musculoskeletal:  Edematous left aka stump. RLE with evidence of multiple old healed abrasions on shin and few scabs on right knee.  Neurological:  Flat, but more alert. Makes eye contact with cues. Can follow simple commands.  Pupils reactive. RUE and RLE 3- to 3/5.  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 Physiatrist is providing close team supervision and 24 hour management of active medical problems listed below. Physiatrist and rehab team continue to assess barriers to discharge/monitor patient progress toward functional and medical goals. FIM: FIM - Bathing Bathing Steps Patient Completed: Chest;Abdomen;Left Arm;Right Arm Bathing: 2: Max-Patient completes  3-4 21f 10 parts or 25-49%  FIM - Upper Body Dressing/Undressing Upper body dressing/undressing: 0: Wears gown/pajamas-no public clothing FIM - Lower Body Dressing/Undressing Lower body dressing/undressing: 0: Wears gown/pajamas-no public clothing  FIM - Toileting Toileting: 0:  Activity did not occur  FIM - Air cabin crew Transfers: 0-Activity did not occur  FIM - Control and instrumentation engineer Devices: Arm rests Bed/Chair Transfer: 0: Activity did not occur  FIM - Locomotion: Wheelchair Locomotion: Wheelchair: 0: Activity did not occur FIM - Locomotion: Ambulation Locomotion: Ambulation: 0: Activity did not occur  Comprehension Comprehension Mode: Auditory Comprehension: 2-Understands basic 25 - 49% of the time/requires cueing 51 - 75% of the time  Expression Expression Mode: Verbal Expression: 2-Expresses basic 25 - 49% of the time/requires cueing 50 - 75% of the time. Uses single words/gestures.  Social Interaction Social Interaction: 2-Interacts appropriately 25 - 49% of time - Needs frequent redirection.  Problem Solving Problem Solving: 2-Solves basic 25 - 49% of the time - needs direction more than half the time to initiate, plan or complete simple activities  Memory Memory: 3-Recognizes or recalls 50 - 74% of the time/requires cueing 25 - 49% of the time  Medical Problem List and Plan:  1. Functional deficits secondary to L frontoparietal SDH past falls in setting of anticoagulation.  2. DVT Prophylaxis/Anticoagulation: Mechanical: Antiembolism stockings, thigh (TED hose) Bilateral lower extremities  Sequential compression devices, below knee Bilateral lower extremities  3. Chronic pain?/ Headaches: Will continue with tramadol for now.   4. Mood: Monitor for any evidence of anxiety. Question on valium for anxiety at home. LCSW to follow for evaluation and supportive  5. Neuropsych: This patient is not capable of making decisions on his own behalf.  6. Skin/Wound Care: Routine pressure relief measures. Monitor for signs of breakdown.  7. Fluids/Electrolytes/Nutrition: Monitor I/O for adequate intake. Offer nutritional supplements if intake poor.  8. CAD with NISCM:Off Xarelto due to falls. Will monitor daily weights  for stability as well as signs of overload. Low salt diet.  Marland Kitchen Resumed Lipitor.   -HR in 60-70's when I listened apically  -check EKG with RS today  -resume coreg  - cardiology/EP to follow up for further recs 9. Seizure prophylaxis: Continue Keppra bid.  10. Hyponatremia: likely central, may some volume depletion  -sodium still only 129 yesterday    11. Unresponsive episode: likely volume depletion. repsonded to IVF  -ECHO---with EF 20-25% which was a big change from prior study  -encourage adequate PO intake  -re-check bmet now.   -pt appears to be back at neuro baseline today.  LOS (Days) 3 A FACE TO FACE EVALUATION WAS PERFORMED  SWARTZ,ZACHARY T 12/16/2013 7:57 AM

## 2013-12-16 NOTE — Consult Note (Signed)
Reason for Consult: syncope/frequent falls/ recurrent LV dysfunction/ dense ventricular ectopy  Referring Physician: Dr. Tawny Hopping is an 73 y.o. male.   HPI:  The patient is a 73 yo man with multiple medical problems that has been asked to be seen by EP after falling out, sustaining a subdural hematoma and found to have worsening LV dysfunction. He has had a h/o intermittent LV dysfunction which had normalized 2 years ago. Now his EF is 20%. He has dense ventricular ectopy and it is unclear if this may be the cause of his LV dysfunction. He does not remember passing out but he does have memory of many falls over the years. He denies CHF symptoms. 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 The patient is a very poor historian and appears to have some brain injury based on his pattern of speech. He can answer simple questions but not much else.   PMH: Past Medical History  Diagnosis Date  . Arthritis   . Hypertension   . CHF (congestive heart failure)     a. NICM/acute CHF 2007 with EF 10-20%. b. Improved EF 50-55% by echo 12/2011.  Marland Kitchen Hyperlipidemia   . Coronary artery disease     a. NSTEMI 2007 with cath showing mild nonobstructive CAD by cath 2007 (40% mLAD, 40%mRCA) at time of dx of NICM.  Marland Kitchen Tortuous aorta     a. Noted by cath 2007, recommendation for OP [place].  Marland Kitchen BPH (benign prostatic hyperplasia)   . Wide-complex tachycardia     a. Noted 2007.  . Obesity   . Valvular heart disease     a. Mod MR by echo 06/2005 but no significant MR 2013.  Marland Kitchen DVT (deep venous thrombosis)   . Pulmonary embolism     PSHX: Past Surgical History  Procedure Laterality Date  . Vena cava filter placement  01/03/2012    Procedure: INSERTION VENA-CAVA FILTER;  Surgeon: Rosetta Posner, MD;  Location: [facility];  Service: Vascular;  Laterality: Left;  . Amputation  01/05/2012    Procedure: AMPUTATION ABOVE KNEE;  Surgeon: Rosetta Posner, MD;  Location: [facility];  Service: Vascular;  Laterality: Left;  .  Craniotomy Left 12/05/2013    Procedure: CRANIOTOMY HEMATOMA EVACUATION SUBDURAL- LEFT;  Surgeon: Ashok Pall, MD;  Location: [facility];  Service: Neurosurgery;  Laterality: Left;  CRANIOTOMY HEMATOMA EVACUATION SUBDURAL- LEFT    FAMHX: Family History  Problem Relation Age of Onset  . Heart attack Mother     In her 63's  . Stroke Mother     In her 48's  . CAD Brother     Social History:  reports that he has quit smoking. He uses smokeless tobacco. He reports that he does not drink alcohol or use illicit drugs.  Allergies: No Known Allergies  Medications: reviewed  Ct Head Wo Contrast  12/14/2013   CLINICAL DATA:  Left-sided subdural hematoma.  EXAM: CT HEAD WITHOUT CONTRAST  TECHNIQUE: Contiguous axial images were obtained from the base of the skull through the vertex without intravenous contrast.  COMPARISON:  CT scan of December 10, 2013.  FINDINGS: Two surgical burr holes are again noted involving the left frontal and parietal skull. Surgical staples are again noted. Paranasal sinuses appear normal. Soft tissue gas is seen in the left temporal scalp region as well as postoperative pneumocephalus seen in the left frontal region which is decreased compared to prior exam. Stable parafalcine and left tentorial subdural  hematoma is noted. Ventricular size is within normal limits. 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 There remains approximately 7 mm of left to right midline shift which is not significantly changed compared to prior exam. The evolving contusion in the left temporal lobe seen on prior exam is not well visualized currently. No acute infarction is noted.  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 Stable left to right midline shift of 7 mm is noted. Stable small parafalcine and left tentorial subdural hematoma is noted   Electronically Signed    By: Sabino Dick M.D.   On: 12/14/2013 10:43    ROS  As stated in the HPI and negative for all other systems.  Physical Exam  Vitals:Blood pressure 133/63, pulse 40, temperature 98.7 F (37.1 C), temperature source Oral, resp. rate 19, height 5\' 7"  (1.702 m), weight 210 lb 5.1 oz (95.4 kg), SpO2 99.00%.  diskempt and chronically ill appearing NAD HEENT: left of midline craniotomy with without bleeding or hematoma Neck:  No JVD, no thyromegally Lymphatics:  No adenopathy Back:  No CVA tenderness Lungs:  Clear with no wheezes HEART:  IRegular rate rhythm, no murmurs, no rubs, no clicks Abd:  Normal with bowel sounds present Ext:  2 plus pulses, no edema, no cyanosis, no clubbing Skin:  No rashes no nodules Neuro:  CN II through XII intact, motor grossly intact anwers questions minimally.  NSR with PVC's in a bigeminal fashion  Echo - reviewed   Assessment/Plan: 1. Subdural hematoma 2. Frequent falls but no obvious syncope by his history 3. Recurrent LV dysfunction, etiology unknown, non-ischemic 4. H/o non-obstructive CAD 5. HTN 6. Dementia by exam 7. Dense ventricular ectopy, asymptomatic Rec: a difficult constellation of problems. He has multiple comorbidities and I do not think he is a candidate for ICD implantation. I would recommend conservative treatment with optimization of his heart failure meds. As his ventricular ectopy is asymptomatic, would not use amiodarone.   Niaomi Cartaya,M.D.  Carleene Overlie TaylorMD 12/16/2013, 9:40 AM

## 2013-12-16 NOTE — Progress Notes (Signed)
Physical Therapy Session Note  Patient Details  Name: Kevin [name] MRN: [id number] Date of Birth: January 23, 1941  Today's Date: 12/16/2013 PT Individual Time: 1345-1430 PT Individual Time Calculation (min): 45 min   Short Term Goals: Week 1:  PT Short Term Goal 1 (Week 1): Pt will roll R and L at mod A level PT Short Term Goal 2 (Week 1): Pt will perform static sitting balance at S level without UE support PT Short Term Goal 3 (Week 1): Pt will perform dynamic sitting balance with single UE support x 2 mins at min A level PT Short Term Goal 4 (Week 1): Pt will perform SB transfers at max A of single therapist  Skilled Therapeutic Interventions/Progress Updates:  Pt was seen bedside in the pm. Pt cleared by nursing to participate with therapy. Pt sleeping but aroused to verbal stimuli. Pt c/o headache but willing to attempt therapy. Pt transferred supine to edge of bed with max A and side rail. Pt tolerated edge of bed about 20 minutes with S and L UE support, pt c/o mild dizziness but no increase in c/o with activity. Pt transferred edge of bed to supine with max A. Pt performed R LE exercises for strengthening and flexibility. Pt utilized green thera band brought in from home, pt educated on proper use of thera band for R LE exercises.  Pt requested to utilize bed pan. Pt rolled with mod to max A to place bed pan. Pt given call bell and NT notified.  Therapy Documentation Precautions:  Precautions Precautions: Fall Precaution Comments: Pt with Lt AKA; wears prosthesis, not in room, burr hole evacuation Required Braces or Orthoses: Other Brace/Splint Other Brace/Splint: prosthesis Restrictions Weight Bearing Restrictions: No Other Position/Activity Restrictions: Son reports pt. stopped using prosthesis and began using wheelchair as primary mod of mobility due to pain in shoulders while using RW General:   Pain: Pt c/o headache.  See FIM for current functional status  Therapy/Group:  Individual Therapy  Stanley, Lyness 12/16/2013, 3:36 PM

## 2013-12-17 ENCOUNTER — Inpatient Hospital Stay (HOSPITAL_COMMUNITY): Payer: Medicare Other | Admitting: *Deleted

## 2013-12-17 ENCOUNTER — Inpatient Hospital Stay (HOSPITAL_COMMUNITY): Payer: Medicare Other

## 2013-12-17 ENCOUNTER — Inpatient Hospital Stay (HOSPITAL_COMMUNITY): Payer: Medicare Other | Admitting: Speech Pathology

## 2013-12-17 DIAGNOSIS — I499 Cardiac arrhythmia, unspecified: Secondary | ICD-10-CM

## 2013-12-17 LAB — BASIC METABOLIC PANEL
ANION GAP: 12 (ref 5–15)
BUN: 30 mg/dL — AB (ref 6–23)
CALCIUM: 8.5 mg/dL (ref 8.4–10.5)
CHLORIDE: 94 meq/L — AB (ref 96–112)
CO2: 23 mEq/L (ref 19–32)
Creatinine, Ser: 1.61 mg/dL — ABNORMAL HIGH (ref 0.50–1.35)
GFR, EST AFRICAN AMERICAN: 47 mL/min — AB (ref >90)
GFR, EST NON AFRICAN AMERICAN: 41 mL/min — AB (ref >90)
Glucose, Bld: 86 mg/dL (ref 70–99)
Potassium: 5.4 mEq/L — ABNORMAL HIGH (ref 3.7–5.3)
Sodium: 129 mEq/L — ABNORMAL LOW (ref 137–147)

## 2013-12-17 NOTE — Progress Notes (Signed)
The skilled treatment note has been reviewed and SLP is in agreement.  Muadh Creasy, M.A., CCC-SLP  [phone]   

## 2013-12-17 NOTE — Progress Notes (Signed)
Social Work  Social Work Assessment and Plan  Patient Details  Name: Kevin [name] MRN: [id number] Date of Birth: 03-24-1940  Today's Date: 12/14/2013  Problem List:  Patient Active Problem List   Diagnosis Date Noted  . Systolic dysfunction 67/20/9470  . Syncope 12/14/2013  . Traumatic subdural hemorrhage 12/13/2013  . Ventricular bigeminy 12/12/2013  . Subdural hematoma 12/05/2013  . 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 Subdural hematoma without coma 12/04/2013  . Amputee, 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  . Renal failure 01/11/2012  . Leucocytosis 01/11/2012  . DVT, femoral, acute 01/11/2012  . DVT of axillary vein, acute 01/04/2012  . Acute pulmonary embolism 01/04/2012  . 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 Severe sepsis(995.92) 01/02/2012  . Septic shock(785.52) 01/02/2012  . Hypotension 01/02/2012  . Right ventricular failure 01/02/2012  . 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 HTN (hypertension) 12/31/2011  . CAD (coronary artery disease) 12/31/2011  . CHF (congestive heart failure) 12/31/2011  . Tobacco abuse 12/31/2011  . Hyperlipidemia 12/31/2011   Past Medical History:  Past Medical History  Diagnosis Date  . Arthritis   . Hypertension   . CHF (congestive heart failure)     a. NICM/acute CHF 2007 with EF 10-20%. b. Improved EF 50-55% by echo 12/2011.  Marland Kitchen Hyperlipidemia   . Coronary artery disease     a. NSTEMI 2007 with cath showing mild nonobstructive CAD by cath 2007 (40% mLAD, 40%mRCA) at time of dx of NICM.  Marland Kitchen Tortuous aorta     a. Noted by cath 2007, recommendation for OP [place].  Marland Kitchen BPH (benign prostatic hyperplasia)   . Wide-complex tachycardia     a. Noted 2007.  . Obesity   . Valvular heart disease     a. Mod MR by echo 06/2005 but no significant MR 2013.  Marland Kitchen DVT (deep venous thrombosis)   . Pulmonary embolism    Past Surgical History:  Past Surgical History  Procedure Laterality Date  . Vena cava filter placement  01/03/2012    Procedure: INSERTION  VENA-CAVA FILTER;  Surgeon: Rosetta Posner, MD;  Location: [facility];  Service: Vascular;  Laterality: Left;  . Amputation  01/05/2012    Procedure: AMPUTATION ABOVE KNEE;  Surgeon: Rosetta Posner, MD;  Location: [facility];  Service: Vascular;  Laterality: Left;  . Craniotomy Left 12/05/2013    Procedure: CRANIOTOMY HEMATOMA EVACUATION SUBDURAL- LEFT;  Surgeon: Ashok Pall, MD;  Location: [facility];  Service: Neurosurgery;  Laterality: Left;  CRANIOTOMY HEMATOMA EVACUATION SUBDURAL- LEFT   Social History:  reports that he has quit smoking. He uses smokeless tobacco. He reports that he does not drink alcohol or use illicit drugs.  Family / Support Systems Marital Status: Widow/Widower Patient Roles: Parent Children: son, Aceson Labell (lives with pt) @ (C) [phone] Other Supports: nephew, Babs Sciara (also in the home);  sister, Lovenia Shuck @ [phone]; daughter, Janeice Robinson (lives close to pt) @ (C) [phone]; daughter, Shanon Rosser @ [phone] Anticipated Caregiver: son, Jusitn Salsgiver Ability/Limitations of Caregiver: Kinser Fellman states he is a Nutritional therapist who recently gave up his shop to be home more with his dad.  He states he is available 24/7 and has physical ability to do up to mod assist level with his dad Caregiver Availability: 24/7 Family Dynamics: pt describes all family as very supportive.  As noted, son has committed to being primary caregiver to pt.    Social History Preferred language: English Religion: Baptist Cultural Background: NA Education: quit 8th grade  Read: Yes ("pretty basic though") Write: Yes Employment Status: Retired Date Retired/Disabled/Unemployed: 2013 after his AKA Freight forwarder Issues: None Guardian/Conservator: None - per MD, pt not capable of making decisions on his own behalf   Abuse/Neglect Physical Abuse: Denies Verbal Abuse: Denies Sexual Abuse: Denies Exploitation of patient/patient's resources: Denies Self-Neglect:  Denies  Emotional Status Pt's affect, behavior adn adjustment status: Pt lying in bed and actually able to complete interview and provide accurate information (reviewed with son).  Denies any emotional distres - no s/s depression or anxiety.  Son reports he has not witnessed any distress with this hospitalization.  Will refer to neuropsych for cognitive and coping eval Recent Psychosocial Issues: None per son Pyschiatric History: None Substance Abuse History: None  Patient / Family Perceptions, Expectations & Goals Pt/Family understanding of illness & functional limitations: Pt reports "I had a blood clot in my brain...they had to operate to get the pressure off..."  Son with general understanding of pt's diagnosis, surgery and current functional limitations/ need for CIR. Premorbid pt/family roles/activities: Pt with most mobility via w/c per son.  Using prosthesis only intermittently.  Pt was independent overall but with falls recently.  Son had moved back in with pt to assist and provide caregiver support. Anticipated changes in roles/activities/participation: Little change anticipated as son was providing f/t assist prior. Pt/family expectations/goals: "just want to be able to go home" per pt  [employer]: None Premorbid Home Care/DME Agencies: Other (Comment) (HH and OP following AKA) Transportation available at discharge: yes Resource referrals recommended: Neuropsychology  Discharge Planning Living Arrangements: Children;Other relatives Support Systems: Children;Other relatives Type of Residence: Private residence Insurance Resources: Medicare;Medicaid (specify county) Pensions consultant: Social Careers adviser Screen Referred: No Living Expenses: Education officer, community Management: Family Does the patient have any problems obtaining your medications?: No Home Management: son primarily Patient/Family Preliminary Plans: pt plans to return home with son as  primary support and intermittent assist of nephew Barriers to Discharge: Steps Social Work Anticipated Follow Up Needs: HH/OP;Support Group Expected length of stay: 14-20 days  Clinical Impression Unfortunate gentleman here following falls with SDH and having burr hole evacuation.  Able to complete interview and provide accurate information (per son).  Denies any significant emotional distress, however, will monitor.  Family prepared to provide 24/7 assistance.  Will follow for d/c planning needs.  Zohra Clavel 12/14/2013, 3:53 PM

## 2013-12-17 NOTE — Progress Notes (Signed)
Speech Language Pathology Daily Session Note  Patient Details  Name: Kevin [name] MRN: [id number] Date of Birth: 1941/02/14  Today's Date: 12/17/2013 SLP Individual Time: 0930-1030 SLP Individual Time Calculation (min): 60 min  Short Term Goals: Week 1: SLP Short Term Goal 1 (Week 1): Pt will focus attention to external stimuli with Min assist repeats SLP Short Term Goal 2 (Week 1): Pt will initiate verbal and functional tasks with Mod assist  SLP Short Term Goal 3 (Week 1): Pt will sustain attention to basic functional tasks for 45-60 seconds with Mod assist  SLP Short Term Goal 4 (Week 1): Pt will utilize external aids for orientation with Mod assist  SLP Short Term Goal 5 (Week 1): Patient will label 2 physical and 1 cognitive change with Mod question cues  Skilled Therapeutic Interventions: Skilled treatment session focused on cognitive-linguistic goals. Patient was independently oriented to date and place but required Min A multimodal cues for orientation to situation. Patient demonstrated emergent awareness of cognitive/linguistic deficits by reporting "I sometimes use the wrong word for another word" which was demonstrated x1 during session. Student facilitated session by providing Mod A multimodal cues for problem solving during a basic matching activity and Min-Mod A multimodal cues for sustained attention during functional conversation. Patient initially Total A for oral reading of schedule which faded to Min A multimodal cues once schedule was adapted with larger print. Patient reported headache and nausea but was easily redirected. RN made aware. Patient left in bed with all needs within reach. Continue with current plan of care.    FIM:  Comprehension Comprehension Mode: Auditory Comprehension: 3-Understands basic 50 - 74% of the time/requires cueing 25 - 50%  of the time Expression Expression Mode: Verbal Expression: 3-Expresses basic 50 - 74% of the time/requires cueing 25  - 50% of the time. Needs to repeat parts of sentences. Social Interaction Social Interaction: 3-Interacts appropriately 50 - 74% of the time - May be physically or verbally inappropriate. Problem Solving Problem Solving: 3-Solves basic 50 - 74% of the time/requires cueing 25 - 49% of the time Memory Memory: 3-Recognizes or recalls 50 - 74% of the time/requires cueing 25 - 49% of the time  Pain Pain Assessment Pain Assessment: 0-10 Pain Score: 8  Pain Type: Surgical pain Pain Location: Head Pain Orientation: Left;Anterior Pain Descriptors / Indicators: Aching Pain Onset: On-going Pain Intervention(s): RN made aware;Repositioned Multiple Pain Sites: No  Therapy/Group: Individual Therapy  Jaqlyn Gruenhagen 12/17/2013, 12:35 PM

## 2013-12-17 NOTE — Progress Notes (Signed)
[facility] PHYSICAL MEDICINE & REHABILITATION     PROGRESS NOTE    Subjective/Complaints: Up this morning. Denies h/a. No sob, cough.   Objective: Vital Signs: Blood pressure 130/75, pulse 52, temperature 98.5 F (36.9 C), temperature source Oral, resp. rate 18, height 5\' 7"  (1.702 m), weight 95.8 kg (211 lb 3.2 oz), SpO2 100.00%. No results found. No results found for this basename: WBC, HGB, HCT, PLT,  in the last 72 hours  Recent Labs  12/16/13 0906 12/17/13 0538  NA 129* 129*  K 4.8 5.4*  CL 96 94*  GLUCOSE 101* 86  BUN 32* 30*  CREATININE 1.53* 1.61*  CALCIUM 8.6 8.5   CBG (last 3)  No results found for this basename: GLUCAP,  in the last 72 hours  Wt Readings from Last 3 Encounters:  12/17/13 95.8 kg (211 lb 3.2 oz)  12/13/13 93 kg (205 lb 0.4 oz)  12/13/13 93 kg (205 lb 0.4 oz)    Physical Exam:  HENT: dentition fair Left curvilinear craniotomy site with staples intact. Borderline dentition--missing numerous teeth.  Eyes:  Pupils reactive to light. anicteric  Neck: Neck supple. No thyromegaly present.  Cardiovascular: Regular rhythm. HR 75-80  Respiratory: Effort normal and breath sounds normal. No respiratory distress. No rales  GI: Soft. Bowel sounds are normal. He exhibits no distension.  Musculoskeletal:  Edematous left aka stump. RLE with evidence of multiple old healed abrasions on shin and few scabs on right knee.  Neurological:  Flat, but more alert. Makes eye contact with cues. Can follow simple commands.  Pupils reactive. RUE and RLE 3- to 3/5.  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 Physiatrist is providing close team supervision and 24 hour management of active medical problems listed  below. Physiatrist and rehab team continue to assess barriers to discharge/monitor patient progress toward functional and medical goals. FIM: FIM - Bathing Bathing Steps Patient Completed: Chest;Abdomen;Left Arm;Right Arm Bathing: 2: Max-Patient completes 3-4 81f 10 parts or 25-49%  FIM - Upper Body Dressing/Undressing Upper body dressing/undressing: 0: Wears gown/pajamas-no public clothing FIM - Lower Body Dressing/Undressing Lower body dressing/undressing: 0: Wears gown/pajamas-no public clothing  FIM - Toileting Toileting: 0: Activity did not occur  FIM - Air cabin crew Transfers: 0-Activity did not occur  FIM - Control and instrumentation engineer Devices: Bed rails Bed/Chair Transfer: 2: Bed > Chair or W/C: Max A (lift and lower assist);2: Chair or W/C > Bed: Max A (lift and lower assist)  FIM - Locomotion: Wheelchair Locomotion: Wheelchair: 0: Activity did not occur FIM - Locomotion: Ambulation Locomotion: Ambulation: 0: Activity did not occur  Comprehension Comprehension Mode: Auditory Comprehension: 2-Understands basic 25 - 49% of the time/requires cueing 51 - 75% of the time  Expression Expression Mode: Verbal Expression: 3-Expresses basic 50 - 74% of the time/requires cueing 25 - 50% of the time. Needs to repeat parts of sentences.  Social Interaction Social Interaction: 2-Interacts appropriately 25 - 49% of time - Needs frequent redirection.  Problem Solving Problem Solving: 2-Solves basic 25 - 49% of the time - needs direction more than half the time to initiate, plan or complete simple activities  Memory Memory: 3-Recognizes or recalls 50 - 74% of the time/requires cueing 25 - 49% of the time  Medical Problem List and Plan:  1.  Functional deficits secondary to L frontoparietal SDH past falls in setting of anticoagulation.  2. DVT Prophylaxis/Anticoagulation: Mechanical: Antiembolism stockings, thigh (TED hose) Bilateral lower extremities   Sequential compression devices, below knee Bilateral lower extremities  3. Chronic pain?/ Headaches: Will continue with tramadol for now.   4. Mood: Monitor for any evidence of anxiety. ? valium for anxiety at home. LCSW to follow for evaluation and supportive  5. Neuropsych: This patient is not capable of making decisions on his own behalf.  6. Skin/Wound Care: Routine pressure relief measures. Monitor for signs of breakdown.  7. Fluids/Electrolytes/Nutrition: Monitor I/O for adequate intake. Offer nutritional supplements if intake poor.  8. CAD with NISCM:Off Xarelto due to falls. Will monitor daily weights for stability as well as signs of overload. Low salt diet.  Marland Kitchen Resumed Lipitor.    -HR 75-80 on my exam today  -EP consult recs noted--consvt care 9. Seizure prophylaxis: Continue Keppra bid.  10. Hyponatremia: likely central, may some volume depletion  -sodium remains at 129  -continue to follow serially    11. Unresponsive episode: volume depletion repsonded to IVF  -ECHO---with EF 20-25% which was a big change from prior study  -encourage adequate PO intake   -pt appears to be back at neuro baseline    LOS (Days) 4 A FACE TO FACE EVALUATION WAS PERFORMED  SWARTZ,ZACHARY T 12/17/2013 7:44 AM

## 2013-12-17 NOTE — Care Management Note (Signed)
[facility] Individual Statement of Services  Patient Name:  Kevin [name]  Date:  12/17/2013  Welcome to the [facility].  Our goal is to provide you with an individualized program based on your diagnosis and situation, designed to meet your specific needs.  With this comprehensive rehabilitation program, you will be expected to participate in at least 3 hours of rehabilitation therapies Monday-Friday, with modified therapy programming on the weekends.  Your rehabilitation program will include the following services:  Physical Therapy (PT), Occupational Therapy (OT), Speech Therapy (ST), 24 hour per day rehabilitation nursing, Therapeutic Recreaction (TR), Neuropsychology, Case Management (Social Worker), Rehabilitation Medicine, Nutrition Services and Pharmacy Services  Weekly team conferences will be held on Tuesdays to discuss your progress.  Your Social Worker will talk with you frequently to get your input and to update you on team discussions.  Team conferences with you and your family in attendance may also be held.  Expected length of stay: 3 weeks  Overall anticipated outcome: minimal assistance  Depending on your progress and recovery, your program may change. Your Social Worker will coordinate services and will keep you informed of any changes. Your Social Worker's name and contact numbers are listed  below.  The following services may also be recommended but are not provided by the [facility] will be made to provide these services after discharge if needed.  Arrangements include referral to agencies that provide these services.  Your insurance has been verified to be:  Medicare and Medicaid Your primary doctor is:  Karma Greaser, DO  Pertinent information will be shared with your doctor and your  insurance company.  Social Worker:  [place], [place] or [phone]   Information discussed with and copy given to patient by: Lennart Pall, 12/17/2013, 12:58 PM

## 2013-12-17 NOTE — Care Management Note (Deleted)
[facility] Individual Statement of Services  Patient Name:  Kevin [name]  Date:  12/14/2013  Welcome to the [facility].  Our goal is to provide you with an individualized program based on your diagnosis and situation, designed to meet your specific needs.  With this comprehensive rehabilitation program, you will be expected to participate in at least 3 hours of rehabilitation therapies Monday-Friday, with modified therapy programming on the weekends.  Your rehabilitation program will include the following services:  Physical Therapy (PT), Occupational Therapy (OT), Speech Therapy (ST), 24 hour per day rehabilitation nursing, Therapeutic Recreaction (TR), Neuropsychology, Case Management (Social Worker), Rehabilitation Medicine, Nutrition Services and Pharmacy Services  Weekly team conferences will be held on Tuesdays to discuss your progress.  Your Social Worker will talk with you frequently to get your input and to update you on team discussions.  Team conferences with you and your family in attendance may also be held.  Expected length of stay: 3 weeks  Overall anticipated outcome: supervision  Depending on your progress and recovery, your program may change. Your Social Worker will coordinate services and will keep you informed of any changes. Your Social Worker's name and contact numbers are listed  below.  The following services may also be recommended but are not provided by the [facility] will be made to provide these services after discharge if needed.  Arrangements include referral to agencies that provide these services.  Your insurance has been verified to be:  Medicare and Medicaid Your primary doctor is:  Karma Greaser  Pertinent information will be shared with your doctor and your insurance  company.  Social Worker:  [facility], [facility] or ([phone]   Information discussed with and copy given to patient by: Lennart Pall, 12/17/2013, 3:56 PM

## 2013-12-17 NOTE — Progress Notes (Signed)
Occupational Therapy Session Note  Patient Details  Name: Kevin [name] MRN: [id number] Date of Birth: [birth year]  Today's Date: 12/17/2013 OT Individual Time: 0729-0829 OT Individual Time Calculation (min): 60 min    Short Term Goals: Week 1:  OT Short Term Goal 1 (Week 1): Pt.  will perform rolling with min assist during bathing OT Short Term Goal 2 (Week 1): Pt. will sit EOB with midline control for 10 minutes OT Short Term Goal 3 (Week 1): Pt. will bath UB in sitting with minimal assist for balance and SBA for bathing OT Short Term Goal 4 (Week 1): Pt. will perform lateral lean with min assist for peri care EOB OT Short Term Goal 5 (Week 1): Pt. will transfer to toilet with mod assist.   Skilled Therapeutic Interventions/Progress Updates:    Pt seen for ADL retraining with focus on sitting balance, activity tolerance, attention, and bed mobility. Pt received supine in bed requiring mod cues of encouragement for participation d/t "headache." BP 120/78 and HR 52 while supine in bed. Attempted measure BP multiple times in sitting, however unable to achieve reading. Pt reporting some dizziness initially, however no c/o dizziness through remainder of session. Pt required min-supervision for sitting balance with UE supported during bathing. Pt required min-max assist dynamic sitting balance w/o UE support (e.g donning shirt, wringing wash cloth). Pt initiated use of lateral lean technique for buttocks hygiene, requiring assist for thoroughness. Pt fatigued quickly during session, tolerating sitting EOB ~25 min before requesting to return to supine. Completed LB dressing while supine in bed, attempting bridging technique however pt unable to clear buttocks from bed. Completed rolling left<>right with mod cues for technique and min assist. Pt required mod assist for sustained attention to self-care tasks. Pt oriented to person, place, and time, requiring min cues for orientation to situation. Pt  left supine in bed with all needs in reach.    Therapy Documentation Precautions:  Precautions Precautions: Fall Precaution Comments: Pt with Lt AKA; wears prosthesis, not in room, burr hole evacuation Required Braces or Orthoses: Other Brace/Splint Other Brace/Splint: prosthesis Restrictions Weight Bearing Restrictions: No Other Position/Activity Restrictions: Son reports pt. stopped using prosthesis and began using wheelchair as primary mod of mobility due to pain in shoulders while using RW General:   Vital Signs: Therapy Vitals Pulse Rate: 42 BP: 120/54 mmHg Pain: Pain Assessment Pain Score: 3   See FIM for current functional status  Therapy/Group: Individual Therapy  Troyce Febo, Quillian Quince 12/17/2013, 10:50 AM

## 2013-12-17 NOTE — Progress Notes (Signed)
Physical Therapy Session Note  Patient Details  Name: Kevin [name] MRN: [id number] Date of Birth: 02/17/1941  Today's Date: 12/17/2013 PT Individual Time: 1130-1200, [id number], 4268-3419 PT Individual Time Calculation (min): 30 min, 12 min, and 28 min  Short Term Goals: Week 1:  PT Short Term Goal 1 (Week 1): Pt will roll R and L at mod A level PT Short Term Goal 2 (Week 1): Pt will perform static sitting balance at S level without UE support PT Short Term Goal 3 (Week 1): Pt will perform dynamic sitting balance with single UE support x 2 mins at min A level PT Short Term Goal 4 (Week 1): Pt will perform SB transfers at max A of single therapist  Skilled Therapeutic Interventions/Progress Updates:    First session: Patient received semi-reclined in bed, lethargic. Vitals taken with HOB elevated to 31 degrees, see below. Supine>sit with HOB elevated and use of bedrails with modA, patient requires increased time and mod cues. Sitting EOB, vitals taken again: BP: 129/76, SpO2: 100%, however, patient with c/o "lightheadedness". Patient reports he is able to remain sitting EOB, able to perform anterior scooting with bed rails and supervision, able to sit EOB ~5 min. Patient continues to c/o lightheadedness, but reports this does not get worse. Patient requires max encouragement for verbal responses in order to ask about symptoms. Patient returned to supine with modA with HOB elevated after reports of needing to use urinal. Patient prefers this position in bed to use urinal. Patient assisted with placement and continent of bladder. Patient left semi-reclined in bed with bed alarm on and all needs within reach. RN aware of patient status.  Second session: Patient received semi-reclined in bed, refusing to participate in therapy secondary to c/o headache and nausea. Therapist used this time for wheelchair management, locating tilt-n-space wheelchair for patient to utilize due to BP and HR issues. Also  added Jay-2 cushion to assist with pressure relief secondary to patient unable to perform pressure relief independently. Patient left semi-reclined in bed with bed alarm on and all needs within reach.  Third Session: Patient received semi-reclined in bed, continues with c/o headache and nausea. Patient assisted with use of urinal while sitting up in bed. Supine>sit with HOB elevated and use of bedrails with modA, patient requires increased time and mod cues. Sitting EOB, vitals taken again: BP: 122/80, SpO2: 98%, however, patient with c/o "lightheadedness". Patient reports he is able to remain sitting EOB, able to perform anterior scooting with bed rails and modA, able to sit EOB ~7 min. Patient continues to c/o lightheadedness, but reports this does not get worse. Attempted with max encouragement to have patient transfer to tilt-n-space wheelchair to trial comfort, but patient repeatedly refuses, stating "come back in 2 or 3 days." Patient returned to supine with modA with HOB elevated and left semi-reclined in bed with bed alarm on and all needs within reach.   Therapy Documentation Precautions:  Precautions Precautions: Fall Precaution Comments: Pt with Lt AKA; wears prosthesis, not in room, burr hole evacuation Required Braces or Orthoses: Other Brace/Splint Other Brace/Splint: prosthesis Restrictions Weight Bearing Restrictions: No Other Position/Activity Restrictions: Son reports pt. stopped using prosthesis and began using wheelchair as primary mod of mobility due to pain in shoulders while using RW General: PT Amount of Missed Time (min): 18 Minutes of second session, 32 minutes of third session PT Missed Treatment Reason: Patient unwilling to participate;Pain (headache, nausea) Pain: Pain Assessment Pain Assessment: 0-10 Pain Score: 8  Pain Type:  Surgical pain Pain Location: Head Pain Orientation: Left;Anterior Pain Descriptors / Indicators: Aching Pain Onset: On-going Pain  Intervention(s): RN made aware;Repositioned Multiple Pain Sites: No Locomotion : Ambulation Ambulation/Gait Assistance: Not tested (comment)   See FIM for current functional status  Therapy/Group: Individual Therapy  Lillia Abed. Harveer Sadler, PT, DPT 12/17/2013, 1:56 PM

## 2013-12-17 NOTE — Progress Notes (Signed)
Speech Language Pathology Daily Session Note  Patient Details  Name: Kevin [name] MRN: [id number] Date of Birth: 12-16-1940  Today's Date: 12/17/2013 SLP Individual Time: 1400-1430 SLP Individual Time Calculation (min): 30 min  Short Term Goals: Week 1: SLP Short Term Goal 1 (Week 1): Pt will focus attention to external stimuli with Min assist repeats SLP Short Term Goal 2 (Week 1): Pt will initiate verbal and functional tasks with Mod assist  SLP Short Term Goal 3 (Week 1): Pt will sustain attention to basic functional tasks for 45-60 seconds with Mod assist  SLP Short Term Goal 4 (Week 1): Pt will utilize external aids for orientation with Mod assist  SLP Short Term Goal 5 (Week 1): Patient will label 2 physical and 1 cognitive change with Mod question cues  Skilled Therapeutic Interventions: Skilled treatment session focused on cognitive-linguistic goals. Student facilitated session by providing supervision multimodal cues for answering complex yes/no questions, responsive and confrontational naming tasks and following 1- and 2-step commands. Student also facilitated session by providing Mod-Max A multimodal cues for convergent and divergent naming tasks, following 3-step commands and generative naming. Patient required overall Mod-Max A multimodal cues for sustained attention to task and reported feeling "sick," RN made aware. Patient left in bed with all needs within reach. Continue with current plan of care.   FIM:  Comprehension Comprehension Mode: Auditory Comprehension: 3-Understands basic 50 - 74% of the time/requires cueing 25 - 50%  of the time Expression Expression Mode: Verbal Expression: 3-Expresses basic 50 - 74% of the time/requires cueing 25 - 50% of the time. Needs to repeat parts of sentences. Social Interaction Social Interaction: 3-Interacts appropriately 50 - 74% of the time - May be physically or verbally inappropriate. Problem Solving Problem Solving:  3-Solves basic 50 - 74% of the time/requires cueing 25 - 49% of the time Memory Memory: 3-Recognizes or recalls 50 - 74% of the time/requires cueing 25 - 49% of the time  Pain Pain Assessment Pain Assessment: No/denies pain Pain Score: 8  Pain Type: Surgical pain Pain Location: Head Pain Orientation: Left;Anterior Pain Descriptors / Indicators: Aching Pain Onset: On-going Pain Intervention(s): RN made aware;Repositioned Multiple Pain Sites: No  Therapy/Group: Individual Therapy  Cove Haydon 12/17/2013, 3:13 PM

## 2013-12-17 NOTE — Progress Notes (Signed)
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 Card consult 10/04 for LV dysfunction EF 20-25% by echo 10/03, hx falls, dense vent ectopy. Per GT, pt not ICD candidate due to comorbidities (no confirmed hx syncope) and amio not recommended due to pt denies sx from PVCs.   SUBJECTIVE: Pt c/o HA, denies chest pain or SOB  OBJECTIVE Filed Vitals:   12/17/13 0500 12/17/13 0551 12/17/13 0631 12/17/13 0738  BP:  130/75    Pulse:   64 52  Temp:  98.5 F (36.9 C)    TempSrc:  Oral    Resp:  18    Height:      Weight: 211 lb 3.2 oz (95.8 kg)     SpO2:  100%      Intake/Output Summary (Last 24 hours) at 12/17/13 0929 Last data filed at 12/17/13 0545  Gross per 24 hour  Intake     60 ml  Output    700 ml  Net   -640 ml   Filed Weights   12/15/13 0628 12/16/13 0500 12/17/13 0500  Weight: 210 lb 15.7 oz (95.7 kg) 210 lb 5.1 oz (95.4 kg) 211 lb 3.2 oz (95.8 kg)    PHYSICAL EXAM General: Well developed, well nourished, male in no acute distress. Head: Normocephalic, atraumatic.  Neck: Supple without bruits, JVD minimal elevation. Lungs:  Resp regular and unlabored, slightly decreased BS bases. Heart: RRR, S1, S2, no S3, S4, or murmur; no rub. Abdomen: Soft, non-tender, non-distended, BS + x 4.  Extremities: No clubbing, cyanosis, no edema RLE, s/p LLE AKA Neuro: Alert and oriented X 2. Moves all extremities spontaneously.  LABS: Basic Metabolic Panel: Recent Labs  12/16/13 0906 12/17/13 0538  NA 129* 129*  K 4.8 5.4* Hemolyzed  CL 96 94*  CO2 22 23  GLUCOSE 101* 86  BUN 32* 30*  CREATININE 1.53* 1.61*  CALCIUM 8.6 8.5    Cardiac Enzymes: Recent Labs  12/14/13 1235 12/14/13 1848 12/15/13 0106  TROPONINI <0.30 <0.30 <0.30    Current Medications:  . atorvastatin  20 mg Oral q1800  . carvedilol  3.125 mg Oral BID WC  . levETIRAcetam  500 mg Oral BID  . pantoprazole  40 mg Oral QHS  . senna-docusate  2 tablet Oral QHS  . tamsulosin  0.4 mg Oral QPC supper      ASSESSMENT AND PLAN: Active Problems:   Traumatic subdural hemorrhage - per rehab MD, NS    Syncope - Pt does not remember passing out, no witnessed incidents, no critical arrhythmia/bradycardia when pt on telemetry. Follow, MD advise on loop    Systolic dysfunction - PTA on Lasix 40 mg, Coreg 6.25 mg BID. Weight on admit probably not accurate (160 lbs). Wt 10/01 205 lbs, now 211 lbs. Currently on Coreg 3.125 mg BID, MD advise on restarting Lasix. Continue daily weights.    CKD, stage III - BUN/Cr 22/1.7 at d/c 2013. 25/1.36 on admit.  BMET today was hemolyzed, recheck in am.    Bradycardia - HR on VS in the 40s at times, not on telemetry, this is possibly  due to PVCs, med changes per MD.  Signed, Rosaria Ferries , PA-C 9:29 AM 12/17/2013  Patient seen, examined. Available data reviewed. Agree with findings, assessment, and plan as outlined by Rosaria Ferries, PA-C. Exam reveals alert, obese male in NAD. Lungs CTA. Heart RRR without murmur. Extremities without edema. Plan as above. Continue low-dose coreg. No ACE/ARB secondary to hyperkalemia/acute kidney injury. Would stay off of lasix for now to avoid volume depletion. No other cardiology eval planned. Will arrange outpatient follow-up. Please call if issues or questions arise. thx  Sherren Mocha, M.D. 12/17/2013 12:38 PM

## 2013-12-17 NOTE — Progress Notes (Signed)
The skilled treatment note has been reviewed and SLP is in agreement.  Kelleen Stolze, M.A., CCC-SLP  [phone]   

## 2013-12-18 ENCOUNTER — Inpatient Hospital Stay (HOSPITAL_COMMUNITY): Payer: Medicare Other

## 2013-12-18 ENCOUNTER — Inpatient Hospital Stay (HOSPITAL_COMMUNITY): Payer: Medicare Other | Admitting: Speech Pathology

## 2013-12-18 ENCOUNTER — Inpatient Hospital Stay (HOSPITAL_COMMUNITY): Payer: Medicare Other | Admitting: *Deleted

## 2013-12-18 ENCOUNTER — Inpatient Hospital Stay (HOSPITAL_COMMUNITY): Payer: Medicare Other | Admitting: Occupational Therapy

## 2013-12-18 DIAGNOSIS — I2699 Other pulmonary embolism without acute cor pulmonale: Secondary | ICD-10-CM

## 2013-12-18 LAB — BASIC METABOLIC PANEL
ANION GAP: 14 (ref 5–15)
BUN: 28 mg/dL — AB (ref 6–23)
CO2: 22 mEq/L (ref 19–32)
CREATININE: 1.51 mg/dL — AB (ref 0.50–1.35)
Calcium: 8.6 mg/dL (ref 8.4–10.5)
Chloride: 97 mEq/L (ref 96–112)
GFR calc Af Amer: 51 mL/min — ABNORMAL LOW (ref >90)
GFR, EST NON AFRICAN AMERICAN: 44 mL/min — AB (ref >90)
Glucose, Bld: 84 mg/dL (ref 70–99)
Potassium: 4.4 mEq/L (ref 3.7–5.3)
Sodium: 133 mEq/L — ABNORMAL LOW (ref 137–147)

## 2013-12-18 MED ORDER — OXYCODONE-ACETAMINOPHEN 5-325 MG PO TABS
1.0000 | ORAL_TABLET | Freq: Four times a day (QID) | ORAL | Status: DC | PRN
  Administered 2013-12-18 – 2013-12-27 (×26): 1 via ORAL
  Filled 2013-12-18 (×27): qty 1

## 2013-12-18 NOTE — Progress Notes (Signed)
Physical Therapy Session Note  Patient Details  Name: LONALD TROIANI MRN: [id number] Date of Birth: August 28, 1940  Today's Date: 12/18/2013 PT Individual Time: 1530-1600 PT Individual Time Calculation (min): 30 min   Short Term Goals: Week 1:  PT Short Term Goal 1 (Week 1): Pt will roll R and L at mod A level PT Short Term Goal 2 (Week 1): Pt will perform static sitting balance at S level without UE support PT Short Term Goal 3 (Week 1): Pt will perform dynamic sitting balance with single UE support x 2 mins at min A level PT Short Term Goal 4 (Week 1): Pt will perform SB transfers at max A of single therapist  Skilled Therapeutic Interventions/Progress Updates:  1:1. Pt received supine in bed, initially declining participation in therapy stating, "I'm just so tired, I've done so much therapy today and was sitting in the wheelchair a long time." Pt rubbing head, however, did not formally give number as "it's not too bad." Pt agreeable to participate in therapy as able with mod encouragement and education regarding benefits. Pt req mod A for t/f sup>sit EOB with hospital bed functions. Pt able to maintain sitting balance EOB with B UE support and close(S). Attempted encouragement for transfer to w/c or activity sitting EOB, however, declined due to fatigue and stated need to lie back down. Min A for t/f sit>sup. Once supine therapist attempted initiation of therex to target B LE strength, but upon instruction of exercises pt declined further participation in session. Pt stating, "I'm sorry I just need to rest, this is too much today." Pt left semi-reclined in bed w/ all needs in reach, bed alarm on.   HR: supine: 40>GQQPYPP: 50>DTOIZT: cyclical 24-58 (RN and nurse tech made aware)>supine rechecked: 70  Therapy Documentation Precautions:  Precautions Precautions: Fall Precaution Comments: Pt with Lt AKA; wears prosthesis, burr hole evacuation Required Braces or Orthoses: Other  Brace/Splint Other Brace/Splint: prosthesis Restrictions Weight Bearing Restrictions: No Other Position/Activity Restrictions: Son reports pt. stopped using prosthesis and began using wheelchair as primary mod of mobility due to pain in shoulders while using RW General: PT Amount of Missed Time (min): 30 Minutes PT Missed Treatment Reason: Patient unwilling to participate;Patient fatigue Vital Signs: Therapy Vitals Pulse Rate: 70 Patient Position (if appropriate): Lying  See FIM for current functional status  Therapy/Group: Individual Therapy  Gilmore Laroche 12/18/2013, 4:14 PM

## 2013-12-18 NOTE — Progress Notes (Signed)
Occupational Therapy Session Note  Patient Details  Name: Kevin [name] MRN: [id number] Date of Birth: 02-27-1941  Today's Date: 12/18/2013 OT Individual Time: 0730-0830 OT Individual Time Calculation (min): 60 min    Short Term Goals: Week 1:  OT Short Term Goal 1 (Week 1): Pt.  will perform rolling with min assist during bathing OT Short Term Goal 2 (Week 1): Pt. will sit EOB with midline control for 10 minutes OT Short Term Goal 3 (Week 1): Pt. will bath UB in sitting with minimal assist for balance and SBA for bathing OT Short Term Goal 4 (Week 1): Pt. will perform lateral lean with min assist for peri care EOB OT Short Term Goal 5 (Week 1): Pt. will transfer to toilet with mod assist.   Skilled Therapeutic Interventions/Progress Updates:    Pt seen for ADL retraining with focus on sitting balance, sit<>stand, activity tolerance, attention, and overall cognition. Pt received supine in bed, declining transferring to w/c however agreeable to sitting EOB. BP in supine 107/73, SpO2 98%, and HR 56. Engaged in bathing and dressing sitting EOB for approx 35 min. Pt required min-supervision for dynamic sitting balance and mod cues for midline orientation as pt demonstrating L lean with decreased awareness as reported "falling backwards." Pt with improved sustained attention up to 1 min with min cues. Pt demonstrated brighter affect as he joked with therapist on a few occasions. Completed sit<>stand with +2 total assist with min clearance of buttocks from bed. Completed sit<>stand +2 assist using steady 3x for LB self-care task. Pt demonstrated flexed posture while in standing, tolerating up to 10 seconds. Pt's BP upon sitting 116/78 and HR 50-60. Pt left supine in bed with all needs in reach. Pt demonstrated appropriate use of call light to ensure safety in room.   Therapy Documentation Precautions:  Precautions Precautions: Fall Precaution Comments: Pt with Lt AKA; wears prosthesis, not in  room, burr hole evacuation Required Braces or Orthoses: Other Brace/Splint Other Brace/Splint: prosthesis Restrictions Weight Bearing Restrictions: No Other Position/Activity Restrictions: Son reports pt. stopped using prosthesis and began using wheelchair as primary mod of mobility due to pain in shoulders while using RW General:   Vital Signs:   Pain: Pain Assessment Pain Assessment: No/denies pain Pain Score: 0-No pain Pain Type: Acute pain Pain Location: Head Pain Orientation: Left Pain Descriptors / Indicators: Aching Pain Onset: On-going Pain Intervention(s): Medication (See eMAR)  See FIM for current functional status  Therapy/Group: Individual Therapy  Duayne Cal 12/18/2013, 12:23 PM

## 2013-12-18 NOTE — Progress Notes (Signed)
Speech Language Pathology Daily Session Note  Patient Details  Name: Kevin [name] MRN: [id number] Date of Birth: 1940/10/22  Today's Date: 12/18/2013 SLP Individual Time: 1130-1210 SLP Individual Time Calculation (min): 40 min  Short Term Goals: Week 1: SLP Short Term Goal 1 (Week 1): Pt will focus attention to external stimuli with Min assist repeats SLP Short Term Goal 2 (Week 1): Pt will initiate verbal and functional tasks with Mod assist  SLP Short Term Goal 3 (Week 1): Pt will sustain attention to basic functional tasks for 45-60 seconds with Mod assist  SLP Short Term Goal 4 (Week 1): Pt will utilize external aids for orientation with Mod assist  SLP Short Term Goal 5 (Week 1): Patient will label 2 physical and 1 cognitive change with Mod question cues  Skilled Therapeutic Interventions: Skilled treatment session focused on dysphagia and cognitive-linguistic goals. Student facilitated session by providing Min A multimodal cues for swallowing compensatory strategies of small bites and sips during lunch meal of Dys. 3 textures with thin liquids. Patient did not demonstrated any overt s/s of aspiration and utilized piecemeal swallowing due to large bites. Patient independently requested to use the urinal and required Min A verbal cues for problem solving and attention to task. Continue with current plan of care.     FIM:  Comprehension Comprehension Mode: Auditory Comprehension: 4-Understands basic 75 - 89% of the time/requires cueing 10 - 24% of the time Expression Expression Mode: Verbal Expression: 3-Expresses basic 50 - 74% of the time/requires cueing 25 - 50% of the time. Needs to repeat parts of sentences. Social Interaction Social Interaction: 3-Interacts appropriately 50 - 74% of the time - May be physically or verbally inappropriate. Problem Solving Problem Solving: 2-Solves basic 25 - 49% of the time - needs direction more than half the time to initiate, plan or  complete simple activities Memory Memory: 2-Recognizes or recalls 25 - 49% of the time/requires cueing 51 - 75% of the time FIM - Eating Eating Activity: 5: Needs verbal cues/supervision  Pain Pain Assessment Pain Assessment: No/denies pain Pain Score: 0-No pain  Therapy/Group: Individual Therapy  Kevin [name] 12/18/2013, 4:00 PM

## 2013-12-18 NOTE — Progress Notes (Signed)
[place] PHYSICAL MEDICINE & REHABILITATION     PROGRESS NOTE    Subjective/Complaints: Having pain, h/a's tramadol not helping. HR still low. Not able to tolerate much therapy at this point   Objective: Vital Signs: Blood pressure 130/80, pulse 56, temperature 98.7 F (37.1 C), temperature source Oral, resp. rate 18, height 5\' 7"  (1.702 m), weight 95.8 kg (211 lb 3.2 oz), SpO2 100.00%. No results found. No results found for this basename: WBC, HGB, HCT, PLT,  in the last 72 hours  Recent Labs  12/17/13 0538 12/18/13 0536  NA 129* 133*  K 5.4* 4.4  CL 94* 97  GLUCOSE 86 84  BUN 30* 28*  CREATININE 1.61* 1.51*  CALCIUM 8.5 8.6   CBG (last 3)  No results found for this basename: GLUCAP,  in the last 72 hours  Wt Readings from Last 3 Encounters:  12/17/13 95.8 kg (211 lb 3.2 oz)  12/13/13 93 kg (205 lb 0.4 oz)  12/13/13 93 kg (205 lb 0.4 oz)    Physical Exam:  HENT: dentition fair Left curvilinear craniotomy site with staples intact. Borderline dentition--missing numerous teeth.  Eyes:  Pupils reactive to light. anicteric  Neck: Neck supple. No thyromegaly present.  Cardiovascular: Regular rhythm. HR 75-80  Respiratory: Effort normal and breath sounds normal. No respiratory distress. No rales  GI: Soft. Bowel sounds are normal. He exhibits no distension.  Musculoskeletal:  Edematous left aka stump. RLE with evidence of multiple old healed abrasions on shin and few scabs on right knee.  Neurological:  Flat, but more alert. Makes eye contact with cues. Can follow simple commands.  Pupils reactive. RUE and RLE 3- to 3/5.  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 Physiatrist is providing close team supervision and 24 hour  management of active medical problems listed below. Physiatrist and rehab team continue to assess barriers to discharge/monitor patient progress toward functional and medical goals. FIM: FIM - Bathing Bathing Steps Patient Completed: Chest;Abdomen;Left Arm;Right Arm;Front perineal area Bathing: 3: Mod-Patient completes 5-7 68f 10 parts or 50-74%  FIM - Upper Body Dressing/Undressing Upper body dressing/undressing steps patient completed: Thread/unthread right sleeve of pullover shirt/dresss;Thread/unthread left sleeve of pullover shirt/dress;Put head through opening of pull over shirt/dress Upper body dressing/undressing: 4: Min-Patient completed 75 plus % of tasks FIM - Lower Body Dressing/Undressing Lower body dressing/undressing: 1: Total-Patient completed less than 25% of tasks  FIM - Toileting Toileting: 0: Activity did not occur  FIM - Air cabin crew Transfers: 0-Activity did not occur  FIM - Control and instrumentation engineer Devices: Bed rails;HOB elevated Bed/Chair Transfer: 3: Supine > Sit: Mod A (lifting assist/Pt. 50-74%/lift 2 legs;3: Sit > Supine: Mod A (lifting assist/Pt. 50-74%/lift 2 legs)  FIM - Locomotion: Wheelchair Locomotion: Wheelchair: 0: Activity did not occur FIM - Locomotion: Ambulation Ambulation/Gait Assistance: Not tested (comment) Locomotion: Ambulation: 0: Activity did not occur  Comprehension Comprehension Mode: Auditory Comprehension: 2-Understands basic 25 - 49% of the time/requires cueing 51 - 75% of the time  Expression Expression Mode: Verbal Expression: 2-Expresses basic 25 - 49% of the time/requires cueing 50 - 75% of the time. Uses single words/gestures.  Social Interaction Social Interaction: 2-Interacts appropriately 25 - 49% of time - Needs frequent redirection.  Problem Solving Problem Solving: 2-Solves basic 25 - 49% of the  time - needs direction more than half the time to initiate, plan or complete simple  activities  Memory Memory: 3-Recognizes or recalls 50 - 74% of the time/requires cueing 25 - 49% of the time  Medical Problem List and Plan:  1. Functional deficits secondary to L frontoparietal SDH past falls in setting of anticoagulation.  2. DVT Prophylaxis/Anticoagulation: Mechanical: Antiembolism stockings, thigh (TED hose) Bilateral lower extremities  Sequential compression devices, below knee Bilateral lower extremities  3. Chronic pain?/ Headaches: Will try percocet, dc ultram 4. Mood: Monitor for any evidence of anxiety. ? valium for anxiety at home. LCSW to follow for evaluation and supportive  5. Neuropsych: This patient is not capable of making decisions on his own behalf.  6. Skin/Wound Care: Routine pressure relief measures. Monitor for signs of breakdown.  7. Fluids/Electrolytes/Nutrition: Monitor I/O for adequate intake. Offer nutritional supplements if intake poor.  8. CAD with NISCM:Off Xarelto due to falls. Will monitor daily weights for stability as well as signs of overload. Low salt diet.  Marland Kitchen Resumed Lipitor.    -HR 75-80 on my exam today  -EP consult recs noted--consvt care   -continue low dose coreg, avoid diuretics 9. Seizure prophylaxis: Continue Keppra bid.  10. Hyponatremia: likely central, may some volume depletion  -sodium up to 133  -continue to follow serially--recheck later in week    11. Unresponsive episode: volume depletion repsonded to IVF  -ECHO---with EF 20-25% which was a big change from prior study  -encourage adequate PO intake      LOS (Days) 5 A FACE TO FACE EVALUATION WAS PERFORMED  SWARTZ,ZACHARY T 12/18/2013 7:47 AM

## 2013-12-18 NOTE — Patient Care Conference (Signed)
Inpatient RehabilitationTeam Conference and Plan of Care Update Date: 12/18/2013   Time: 3:15 PM    Patient Name: Kevin [name]      Medical Record Number: [id number]  Date of Birth: 01-09-1941 Sex: Male         Room/Bed: 4W11C/4W11C-01 Payor Info: Payor: MEDICARE / Plan: MEDICARE PART A AND B / Product Type: *No Product type* /    Admitting Diagnosis: traumatic sdh  Admit Date/Time:  12/13/2013  2:59 PM Admission Comments: No comment available   Primary Diagnosis:  <principal problem not specified> Principal Problem: <principal problem not specified>  Patient Active Problem List   Diagnosis Date Noted  . Systolic dysfunction 64/40/3474  . Syncope 12/14/2013  . Traumatic subdural hemorrhage 12/13/2013  . Ventricular bigeminy 12/12/2013  . Subdural hematoma 12/05/2013  . 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 Subdural hematoma without coma 12/04/2013  . Amputee, 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  . Renal failure 01/11/2012  . Leucocytosis 01/11/2012  . DVT, femoral, acute 01/11/2012  . DVT of axillary vein, acute 01/04/2012  . Acute pulmonary embolism 01/04/2012  . 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 Severe sepsis(995.92) 01/02/2012  . Septic shock(785.52) 01/02/2012  . Hypotension 01/02/2012  . Right ventricular failure 01/02/2012  . 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 HTN (hypertension) 12/31/2011  . CAD (coronary artery disease) 12/31/2011  . CHF (congestive heart failure) 12/31/2011  . Tobacco abuse 12/31/2011  . Hyperlipidemia 12/31/2011    Expected Discharge Date: Expected Discharge Date: 01/02/14  Team Members Present: Physician leading conference: Dr. Alger Simons Social Worker Present: Lennart Pall, LCSW Nurse Present: Rozetta Nunnery, RN PT Present: Melene Plan, Cottie Banda, PT OT Present: Roanna Epley, COTA;Jennifer Jolene Schimke, OT SLP Present: Weston Anna, SLP PPS Coordinator present : Daiva Nakayama, RN, CRRN     Current Status/Progress Goal Weekly  Team Focus  Medical   left AKA, left SDH, cardiomyopathy  improve activity tolerance  pain control, bp/cv mgt   Bowel/Bladder   continent bowel/bladder-uses urinal  remain continent  offer urinal , toilet assistance as needed   Swallow/Nutrition/ Hydration   Dys. 3 textures with thin liquids, Min A   Supervision  utilization of small bites and sips   ADL's   min A UB dressing, total assist LB dressing, mod assist bathing, S sitting balance with UE supported, min-max sitting balance w/o UE supported  supervision overall and min assist tub and toilet transfers  sitting balance, functional transfers, activity tolerance, attention, awareness, strengthening, sit<>stand   Mobility   modA overall for bed mobility, supervision sitting balance; has not performed transfer yet due to activity tolerance/vitals  supervision overall  education, safety, activity tolerance, functional transfers, wheelchair mobility, balance, strengthening   Communication             Safety/Cognition/ Behavioral Observations  Mod-Max A   supervision for orientation, min A for problem solving, memory, attention and awareness   sustained attention, emergent awareness, problem solving, memory   Pain   7-8 out of 10 headache  less than 3 out of 10  offer pain medication as needed- percocet added   Skin   Skin CDI  Skin remains free of breakdown  continue to assess daily    Rehab Goals Patient on target to meet rehab goals: Yes *See Care Plan and progress notes for long and short-term goals.  Barriers to Discharge: fatigue, pain    Possible Resolutions to Barriers:  pain mgt, family ed, supervison/min assist goals at w/c level  Discharge Planning/Teaching Needs:  home with son to provide 24/7 assistance      Team Discussion:  Better today overall which could be due to increase in pain meds.  Trial of sliding board tfs and it went well.  Supervision to min assist w/c level goals.  Not sure how he was entering  home PTA as there is no ramp present and pt was primarily w/c level (did not use prosthesis).    Revisions to Treatment Plan:  None   Continued Need for Acute Rehabilitation Level of Care: The patient requires daily medical management by a physician with specialized training in physical medicine and rehabilitation for the following conditions: Daily direction of a multidisciplinary physical rehabilitation program to ensure safe treatment while eliciting the highest outcome that is of practical value to the patient.: Yes Daily medical management of patient stability for increased activity during participation in an intensive rehabilitation regime.: Yes Daily analysis of laboratory values and/or radiology reports with any subsequent need for medication adjustment of medical intervention for : Post surgical problems;Neurological problems  Lise Pincus 12/18/2013, 3:38 PM

## 2013-12-18 NOTE — Progress Notes (Signed)
Physical Therapy Session Note  Patient Details  Name: Kevin [name] MRN: [id number] Date of Birth: 1941/01/23  Today's Date: 12/18/2013 PT Individual Time: 0900-1000 PT Individual Time Calculation (min): 60 min   Short Term Goals: Week 1:  PT Short Term Goal 1 (Week 1): Pt will roll R and L at mod A level PT Short Term Goal 2 (Week 1): Pt will perform static sitting balance at S level without UE support PT Short Term Goal 3 (Week 1): Pt will perform dynamic sitting balance with single UE support x 2 mins at min A level PT Short Term Goal 4 (Week 1): Pt will perform SB transfers at max A of single therapist  Skilled Therapeutic Interventions/Progress Updates:    Patient received semi-reclined in bed. Session focused on increasing activity tolerance with functional transfers. Vitals witih HOB at 44 degrees: BP: 112/66, HR: 50. Supine>sit with HOB elevated and bedrails with minA, scooting to edge of bed with minA. Squat pivot transfer bed>tilt-n-space wheelchair with +2 assist (modA of 2). In tilt-n-space, vitals: BP: 97/83, HR: 78, SpO2: 100%. Patient transported to therapy gym and performed slide board transfer wheelchair<>mat with +2 assist (minA of 2). Step stool positioned for R foot placement. Headrest on tilt-n-space adjusted for improved head support when patient tilted in chair. Patient left sitting in wheelchair with seatbelt donned and all needs within reach. RN aware of patient status.  Therapy Documentation Precautions:  Precautions Precautions: Fall Precaution Comments: Pt with Lt AKA; wears prosthesis, not in room, burr hole evacuation Required Braces or Orthoses: Other Brace/Splint Other Brace/Splint: prosthesis Restrictions Weight Bearing Restrictions: No Other Position/Activity Restrictions: Son reports pt. stopped using prosthesis and began using wheelchair as primary mod of mobility due to pain in shoulders while using RW Pain: Pain Assessment Pain Assessment:  No/denies pain Pain Score: 0-No pain Pain Type: Acute pain Pain Location: Head Pain Orientation: Left Pain Descriptors / Indicators: Aching Pain Onset: On-going Pain Intervention(s): Medication (See eMAR) Locomotion : Ambulation Ambulation/Gait Assistance: Not tested (comment)   See FIM for current functional status  Therapy/Group: Individual Therapy  Lillia Abed. Donyae Kohn, PT, DPT 12/18/2013, 12:22 PM

## 2013-12-18 NOTE — Progress Notes (Signed)
The skilled treatment note has been reviewed and SLP is in agreement.  Reid Regas, M.A., CCC-SLP  [phone]   

## 2013-12-18 NOTE — Progress Notes (Signed)
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 Card consult 10/04 for LV dysfunction EF 20-25% by echo 10/03, hx falls, dense vent ectopy. Per GT, pt not ICD candidate due to comorbidities (no confirmed hx syncope) and amio not recommended due to pt denies sx from PVCs.    Subjective: No major complaints.   Objective: Vital signs in last 24 hours: Temp:  [98.7 F (37.1 C)] 98.7 F (37.1 C) (10/06 0443) Pulse Rate:  [38-67] 54 (10/06 0800) Resp:  [16-18] 18 (10/06 0443) BP: (118-130)/(52-80) 130/80 mmHg (10/06 0443) SpO2:  [98 %-100 %] 100 % (10/06 0443) Last BM Date: 12/17/13  Intake/Output from previous day: 10/05 0701 - 10/06 0700 In: 180 [P.O.:180] Out: 750 [Urine:750] Intake/Output this shift: Total I/O In: 120 [P.O.:120] Out: 200 [Urine:200]  Medications Current Facility-Administered Medications  Medication Dose Route Frequency Provider Last Rate Last Dose  . acetaminophen (TYLENOL) tablet 325-650 mg  325-650 mg Oral Q4H PRN Bary Leriche, PA-C   650 mg at 12/18/13 0402  . alum & mag hydroxide-simeth (MAALOX/MYLANTA) 200-200-20 MG/5ML suspension 30 mL  30 mL Oral Q4H PRN Bary Leriche, PA-C      . atorvastatin (LIPITOR) tablet 20 mg  20 mg Oral q1800 Ivan Anchors Love, PA-C   20 mg at 12/17/13 1910  . bisacodyl (DULCOLAX) suppository 10 mg  10 mg Rectal Daily PRN Bary Leriche, PA-C      . carvedilol (COREG) tablet 3.125 mg  3.125 mg Oral BID WC Rhonda G Barrett, PA-C   3.125 mg at 12/17/13 1910  . diphenhydrAMINE (BENADRYL) 12.5 MG/5ML elixir 12.5-25 mg  12.5-25 mg Oral Q6H PRN Ivan Anchors Love, PA-C      . guaiFENesin-dextromethorphan (ROBITUSSIN DM) 100-10 MG/5ML syrup 5-10 mL  5-10 mL Oral Q6H PRN Bary Leriche, PA-C      . levETIRAcetam (KEPPRA) tablet 500 mg  500 mg Oral BID  Ivan Anchors Love, PA-C   500 mg at 12/18/13 0848  . naloxone [facility]) injection 0.08 mg  0.08 mg Intravenous PRN Bary Leriche, PA-C      . ondansetron [facility]) tablet 4 mg  4 mg Oral Q4H PRN Bary Leriche, PA-C   4 mg at 12/17/13 1740   Or  . ondansetron (ZOFRAN) injection 4 mg  4 mg Intravenous Q4H PRN Bary Leriche, PA-C      . oxyCODONE-acetaminophen (PERCOCET/ROXICET) 5-325 MG per tablet 1 tablet  1 tablet Oral Q6H PRN Meredith Staggers, MD   1 tablet at 12/18/13 0859  . pantoprazole (PROTONIX) EC tablet 40 mg  40 mg Oral QHS Ivan Anchors Love, PA-C   40 mg at 12/17/13 2100  . senna-docusate (Senokot-S) tablet 2 tablet  2 tablet Oral QHS Bary Leriche, PA-C   2 tablet at 12/17/13 2100  . tamsulosin (FLOMAX) capsule 0.4 mg  0.4 mg Oral QPC supper Bary Leriche, PA-C   0.4 mg at 12/17/13 1909  . traZODone (DESYREL) tablet 25-50 mg  25-50 mg Oral QHS PRN Bary Leriche, PA-C        PE: General appearance: alert, cooperative and no distress Neck: no carotid bruit and no JVD Lungs: clear to auscultation bilaterally Heart: regular rate and rhythm Extremities: no LEE s/p remote left  AKA Pulses: 2+ and symmetric Skin: warm and dry Neurologic: Grossly normal  Lab Results:  No results found for this basename: WBC, HGB, HCT, PLT,  in the last 72 hours BMET  Recent Labs  12/16/13 0906 12/17/13 0538 12/18/13 0536  NA 129* 129* 133*  K 4.8 5.4* 4.4  CL 96 94* 97  CO2 22 23 22   GLUCOSE 101* 86 84  BUN 32* 30* 28*  CREATININE 1.53* 1.61* 1.51*  CALCIUM 8.6 8.5 8.6    Assessment/Plan  Active Problems:   Traumatic subdural hemorrhage   Syncope   Systolic dysfunction  1. ? Bradycardia: BB was held earlier this am due to HR of 54 (paramaters set to hold for HR <55). There were also reports that patient had HR as low as the 30s yesterday/overnight. Day RN has not documented any HRs below 50 today. Telemetry monitoring is not available in inpatient rehab. I checked his rate manually x 2, both  readings were stable at 68 and 64. Rate was also checked using pulse ox device and was recorded at 68. Bp has remained stable. I notified RN to give am dose of Coreg 3.125 mg. Recommended that RN recheck pulse throughout the day.   2. Systolic dysfunction: Ef 42-68%. Appears well compensated. Continue and BB. No ACE/ARB due to secondary to hyperkalemia/acute kidney injury. Also recommended to avoid lasix for now to avoid volume depletion. Not a candidate for ICD given comorbidities.    LOS: 5 days    Brittainy M. Rosita Fire, PA-C 12/18/2013 11:49 AM  History and all data above reviewed.  The patient denies any symptoms.  Patient examined.  I agree with the findings as above.  The patient exam reveals COR:RRR  ,  Lungs: Clear  ,  Abd: Positive bowel sounds, no rebound no guarding, Ext No edema  .  All available labs, radiology testing, previous records reviewed. Agree with documented assessment and plan. Bradycardia:  He is tolerating the low dose of beta blocker.  Continue this dose.  Discussed with nursing.   Abelardo Seidner  6:33 PM  12/18/2013

## 2013-12-18 NOTE — Progress Notes (Signed)
Occupational Therapy Session Note  Patient Details  Name: Kevin [name] MRN: [id number] Date of Birth: [birth year]  Today's Date: 12/18/2013 OT Individual Time: 0626-9485 OT Individual Time Calculation (min): 40 min    Skilled Therapeutic Interventions/Progress Updates:    Pt performed sit to stand X 2 using the steady with total assist +2 (pt 30%) in order to place bed pan in his wheelchair as he stated he needed to go to the bathroom.  After a few mins of sitting pt stated he needed to try using the bedpan in the bed as he was not successful in the chair.  Therapist was unsure of pt could transfer to the 3:1 with use of the steady so instead performed sliding board transfer from the wheelchair to the bed with mod assist including max assist for sliding board transfer.  Pt was able to roll right and left using the bed rails with min assist for placing bed pan but needed max assist for managing clothing in supine.  Pt left in bed at conclusion of session as he was very fatigued.   Therapy Documentation Precautions:  Precautions Precautions: Fall Precaution Comments: Pt with Lt AKA; wears prosthesis, burr hole evacuation Required Braces or Orthoses: Other Brace/Splint Other Brace/Splint: prosthesis Restrictions Weight Bearing Restrictions: No Other Position/Activity Restrictions: Son reports pt. stopped using prosthesis and began using wheelchair as primary mod of mobility due to pain in shoulders while using RW  Pain: Pain Assessment Pain Assessment: No/denies pain Pain Score: 0-No pain  See FIM for current functional status  Therapy/Group: Individual Therapy  Dodd Schmid,[place] OTR/L 12/18/2013, 3:59 PM

## 2013-12-18 NOTE — Progress Notes (Signed)
Speech Language Pathology Daily Session Note  Patient Details  Name: Kevin [name] MRN: [id number] Date of Birth: [birth year]  Today's Date: 12/18/2013 SLP Individual Time: 1330-1400 SLP Individual Time Calculation (min): 30 min  Short Term Goals: Week 1: SLP Short Term Goal 1 (Week 1): Pt will focus attention to external stimuli with Min assist repeats SLP Short Term Goal 2 (Week 1): Pt will initiate verbal and functional tasks with Mod assist  SLP Short Term Goal 3 (Week 1): Pt will sustain attention to basic functional tasks for 45-60 seconds with Mod assist  SLP Short Term Goal 4 (Week 1): Pt will utilize external aids for orientation with Mod assist  SLP Short Term Goal 5 (Week 1): Patient will label 2 physical and 1 cognitive change with Mod question cues  Skilled Therapeutic Interventions: Skilled treatment session focused on cognitive-linguistic goals. Student facilitated session by providing Max-Total A multimodal cues for recall and sustained attention during a basic and functional written expression task of writing biographical information. Suspect decreased participation and cognitive function was due to patient reports of fatigue and pain, RN made aware. Patient independently requested to use the urinal and required Min A verbal cues for problem solving and attention to task. Continue with current plan of care.    FIM:  Comprehension Comprehension Mode: Auditory Comprehension: 3-Understands basic 50 - 74% of the time/requires cueing 25 - 50%  of the time Expression Expression Mode: Verbal Expression: 3-Expresses basic 50 - 74% of the time/requires cueing 25 - 50% of the time. Needs to repeat parts of sentences. Social Interaction Social Interaction: 3-Interacts appropriately 50 - 74% of the time - May be physically or verbally inappropriate. Problem Solving Problem Solving: 2-Solves basic 25 - 49% of the time - needs direction more than half the time to initiate, plan or  complete simple activities Memory Memory: 2-Recognizes or recalls 25 - 49% of the time/requires cueing 51 - 75% of the time   Pain Pain Assessment Pain Assessment: 0-10 Pain Score: 7  Pain Type: Acute pain Pain Location: Head Pain Orientation: Left;Anterior Pain Descriptors / Indicators: Aching Pain Onset: On-going Pain Intervention(s): RN made aware;Repositioned Multiple Pain Sites: No  Therapy/Group: Individual Therapy  Romell Wolden 12/18/2013, 4:06 PM

## 2013-12-18 NOTE — Progress Notes (Signed)
The skilled treatment note has been reviewed and SLP is in agreement.  Arraya Buck, M.A., CCC-SLP  [phone]   

## 2013-12-18 NOTE — Treatment Plan (Signed)
After discussion with treatment team, patient has been decreased to 3.5 hours/day of therapy to better address patient's CLOF and activity tolerance.  Teena Irani. Marquesha Robideau, PT, DPT

## 2013-12-19 ENCOUNTER — Inpatient Hospital Stay (HOSPITAL_COMMUNITY): Payer: Medicare Other

## 2013-12-19 NOTE — Progress Notes (Signed)
Occupational Therapy Session Note  Patient Details  Name: Kevin [name] MRN: [id number] Date of Birth: 05-May-1940  Today's Date: 12/19/2013 OT Individual Time: 2025-4270 and 1300-1318 OT Individual Time Calculation (min): 50 min and 18 min      Short Term Goals: Week 1:  OT Short Term Goal 1 (Week 1): Pt.  will perform rolling with min assist during bathing OT Short Term Goal 2 (Week 1): Pt. will sit EOB with midline control for 10 minutes OT Short Term Goal 3 (Week 1): Pt. will bath UB in sitting with minimal assist for balance and SBA for bathing OT Short Term Goal 4 (Week 1): Pt. will perform lateral lean with min assist for peri care EOB OT Short Term Goal 5 (Week 1): Pt. will transfer to toilet with mod assist.   Skilled Therapeutic Interventions/Progress Updates:    Session 1: Pt seen for ADL retraining with focus on functional transfers, attention, bed mobility, and activity tolerance. Pt received supine in bed requesting bed pan. Placed with min assist for rolling. Completed LB dressing and hygiene while supine in bed as pt consistently reporting this is the technique used at home. Pt with increased initiation for LB self-care with this technique. Pt required min assist rolling L<>R and min cues for sequencing. Pt assisted with all parts of LB dressing, requiring assist to completed. Completed SBT bed>w/c with mod assist and mod cues for directing care for positioning of board. Completed UB dressing and grooming tasks from w/c left. Pt with minimal verbalizations and eye contact at beginning of session, however increased slightly by end of session. Pt left sitting in w/c with SLP.    Session 2: Pt seen for 1:1 OT session with focus on recall and and attention. Pt received supine in bed declining getting OOB due "not feeling well" as pt rubbed head. Pt engaged in therapeutic conversation with therapist in regards to personal interests, daily routine at home, etc. Pt oriented to person,  place, situation and year. Required min cues for intellectual awareness. Pt required mod questioning cues for recall of 3 activities completed in AM session. Pt requesting for therapy to "come back later." Pt left with all needs in reach and will follow-up with pt as able.   Therapy Documentation Precautions:  Precautions Precautions: Fall Precaution Comments: Pt with Lt AKA; wears prosthesis, burr hole evacuation Required Braces or Orthoses: Other Brace/Splint Other Brace/Splint: prosthesis Restrictions Weight Bearing Restrictions: No Other Position/Activity Restrictions: Son reports pt. stopped using prosthesis and began using wheelchair as primary mod of mobility due to pain in shoulders while using RW General: General OT Amount of Missed Time: 10 Minutes Vital Signs: Supine: 101/63 Sitting in w/c: 115-75 HR: 72-81  Pain: Pain Assessment Pain Assessment: 0-10 Pain Score: 8  Pain Location: Back (head lft) Pain Orientation: Lower;Mid Pain Descriptors / Indicators: Aching Patients Stated Pain Goal: 5 Pain Intervention(s): Medication (See eMAR);Repositioned  See FIM for current functional status  Therapy/Group: Individual Therapy  Duayne Cal 12/19/2013, 12:23 PM

## 2013-12-19 NOTE — Progress Notes (Signed)
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 Card consult 10/04 for LV dysfunction EF 20-25% by echo 10/03, hx falls, dense vent ectopy. Per GT, pt not ICD candidate due to comorbidities (no confirmed hx syncope) and amio not recommended due to pt denies sx from PVCs.    Subjective: Feels tired today. Just finished PT which was cut short. He feels constipated. Last BM was 10/4.  Objective: Vital signs in last 24 hours: Temp:  [97.8 F (36.6 C)-98.2 F (36.8 C)] 98.2 F (36.8 C) (10/07 0516) Pulse Rate:  [39-73] 58 (10/07 0949) Resp:  [18] 18 (10/07 0516) BP: (118-130)/(76-80) 129/76 mmHg (10/07 0516) SpO2:  [91 %-100 %] 100 % (10/07 0516) Weight:  [206 lb 1.6 oz (93.486 kg)] 206 lb 1.6 oz (93.486 kg) (10/07 0516) Last BM Date: 12/17/13  Intake/Output from previous day: 10/06 0701 - 10/07 0700 In: 360 [P.O.:360] Out: 1300 [Urine:1300] Intake/Output this shift: Total I/O In: 120 [P.O.:120] Out: 50 [Urine:50]  Medications Current Facility-Administered Medications  Medication Dose Route Frequency Provider Last Rate Last Dose  . acetaminophen (TYLENOL) tablet 325-650 mg  325-650 mg Oral Q4H PRN Bary Leriche, PA-C   650 mg at 12/18/13 0402  . alum & mag hydroxide-simeth (MAALOX/MYLANTA) 200-200-20 MG/5ML suspension 30 mL  30 mL Oral Q4H PRN Bary Leriche, PA-C      . atorvastatin (LIPITOR) tablet 20 mg  20 mg Oral q1800 Ivan Anchors Love, PA-C   20 mg at 12/18/13 1853  . bisacodyl (DULCOLAX) suppository 10 mg  10 mg Rectal Daily PRN Bary Leriche, PA-C      . carvedilol (COREG) tablet 3.125 mg  3.125 mg Oral BID WC Rhonda G Barrett, PA-C   3.125 mg at 12/19/13 0949  . diphenhydrAMINE (BENADRYL) 12.5 MG/5ML elixir 12.5-25 mg  12.5-25 mg Oral Q6H PRN Ivan Anchors Love, PA-C      .  guaiFENesin-dextromethorphan (ROBITUSSIN DM) 100-10 MG/5ML syrup 5-10 mL  5-10 mL Oral Q6H PRN Bary Leriche, PA-C      . levETIRAcetam (KEPPRA) tablet 500 mg  500 mg Oral BID Ivan Anchors Love, PA-C   500 mg at 12/19/13 0949  . naloxone [facility]) injection 0.08 mg  0.08 mg Intravenous PRN Bary Leriche, PA-C      . ondansetron [facility]) tablet 4 mg  4 mg Oral Q4H PRN Bary Leriche, PA-C   4 mg at 12/17/13 1740   Or  . ondansetron (ZOFRAN) injection 4 mg  4 mg Intravenous Q4H PRN Bary Leriche, PA-C      . oxyCODONE-acetaminophen (PERCOCET/ROXICET) 5-325 MG per tablet 1 tablet  1 tablet Oral Q6H PRN Meredith Staggers, MD   1 tablet at 12/19/13 0949  . pantoprazole (PROTONIX) EC tablet 40 mg  40 mg Oral QHS Ivan Anchors Love, PA-C   40 mg at 12/18/13 2130  . senna-docusate (Senokot-S) tablet 2 tablet  2 tablet Oral QHS Bary Leriche, PA-C   2 tablet at 12/18/13 2130  . tamsulosin (FLOMAX) capsule 0.4 mg  0.4 mg Oral QPC supper Bary Leriche, PA-C   0.4 mg at 12/18/13 1853  . traZODone (DESYREL) tablet 25-50 mg  25-50 mg Oral QHS PRN Bary Leriche, PA-C  PE: General appearance: alert, cooperative and no distress Neck: no carotid bruit and no JVD Lungs: clear to auscultation bilaterally Heart: regular rate and rhythm Extremities: no LEE s/p remote left AKA Pulses: 2+ and symmetric Skin: warm and dry Neurologic: Grossly normal  Lab Results:  No results found for this basename: WBC, HGB, HCT, PLT,  in the last 72 hours BMET  Recent Labs  12/17/13 0538 12/18/13 0536  NA 129* 133*  K 5.4* 4.4  CL 94* 97  CO2 23 22  GLUCOSE 86 84  BUN 30* 28*  CREATININE 1.61* 1.51*  CALCIUM 8.5 8.6   Filed Weights   12/16/13 0500 12/17/13 0500 12/19/13 0516  Weight: 210 lb 5.1 oz (95.4 kg) 211 lb 3.2 oz (95.8 kg) 206 lb 1.6 oz (93.486 kg)    Assessment/Plan  Active Problems:   Traumatic subdural hemorrhage   Syncope   Systolic dysfunction  1. Bradycardia: Patient continues to have bradycardia  with rates as low as the 30s on low dose BB therapy. We will discontinue his Coreg.  2. Systolic dysfunction: EF 45-62%. Appears well compensated. Will need to monitor status closely as he is not on any HF therapies (intolerant to BB and not a candidate for ACE/ARB therapy secondary to hyperkalemia/acute kidney injury). He has also been off of Lasix, in an effort to avoid volume depletion. Not a candidate for ICD given comorbidities.   Monitor for signs of acute decompensation (weight gain, edema, dyspnea, orthopnea/PND). Notify cardiology if any change in status. Continue low sodium diet and daily weights.    LOS: 6 days    Brittainy M. Ladoris Gene 12/19/2013 11:28 AM  Patient seen, examined. Available data reviewed. Agree with findings, assessment, and plan as outlined by Lyda Jester, PA-C. The patient was independently interviewed and examined. He continues to complain of headache and lower abdominal discomfort. I discussed his case with Dr. Naaman Plummer this am. The patient continues to have bradycardia. His tolerance of rehabilitation is very poor. I think it is best to hold his carvedilol as outlined. He is on minimal therapy for his cardiomyopathy, but this point his severe comorbid conditions are going to drive his prognosis more than his heart condition. Please call if we can be of any other assistance.  Sherren Mocha, M.D. 12/19/2013 2:52 PM

## 2013-12-19 NOTE — Progress Notes (Signed)
Physical Therapy Session Note  Patient Details  Name: Kevin [name] MRN: [id number] Date of Birth: 03/25/1940  Today's Date: 12/19/2013 PT Individual Time: 1100-1140 PT Individual Time Calculation (min): 40 min  Session 2 Time: 1600-1630 Time Calculation (min) 30 min  Short Term Goals: Week 1:  PT Short Term Goal 1 (Week 1): Pt will roll R and L at mod A level PT Short Term Goal 2 (Week 1): Pt will perform static sitting balance at S level without UE support PT Short Term Goal 3 (Week 1): Pt will perform dynamic sitting balance with single UE support x 2 mins at min A level PT Short Term Goal 4 (Week 1): Pt will perform SB transfers at max A of single therapist  Skilled Therapeutic Interventions/Progress Updates:    Session 1: Pt received supine in bed, complaining of increased back and stomach pain. Pt declined out of bed therapy, but agreed to bed level exercises with mod encouragement. Pt performed AROM/AAROM for hip IR/ER, hip flexion, knee flexion, knee extension, hip extension ankle DF/PF on RLE, hip flexion and hip abduction on LLE, glute sets all x10. Pt agreed to sit EOB after mod encouragement. Pt moved supine>sit w/ ModA, max VC's for sequencing and hand placement. Pt reported dizziness with sitting EOB, tolerated for 2 minutes before requesting to lay down again. Pt's HR remained in 70-90 range while seated EOB. Pt moved sit>supine w/ ModA. Pt left supine in bed w/ bed alarm on and all needs within reach.   Session 2: Pt received supine in bed asleep, easily aroused. Pt initiated use of urinal upon therapist arrival. Pt placed urinal inside shorts in order to urinate. After several minutes, pt sat EOB w/ ModA in order to urinate more easily. Pt requested that therapist give him privacy in order to urinate, informed pt that therapist had to stay with him if he was seated EOB. Pt transferred back to supine w/ Min-ModA and completed urination. After urination pt agreed to sit EOB for  upright tolerance and seated balance. Pt sat EOB for 20 minutes w/ BP at 127/85, HR 95. Initially MinA to remain seated progressing to SBA-supervision for seated balance. Pt engaged in therapeutic conversation w/ therapist and RN delivering medication regarding his interests including football. Pt transferred back to supine w/ ModA, repositioned in bed w/ MinA and trendelenburg function. Pt left supine in bed w/ bed alarm on w/ all needs within reach.  Therapy Documentation Precautions:  Precautions Precautions: Fall Precaution Comments: Pt with Lt AKA; wears prosthesis, burr hole evacuation Required Braces or Orthoses: Other Brace/Splint Other Brace/Splint: prosthesis Restrictions Weight Bearing Restrictions: No Other Position/Activity Restrictions: Son reports pt. stopped using prosthesis and began using wheelchair as primary mod of mobility due to pain in shoulders while using RW General:   Vital Signs: Therapy Vitals Temp: 98.2 F (36.8 C) Temp Source: Oral Pulse Rate: 69 Resp: 18 BP: 129/76 mmHg Patient Position (if appropriate): Lying Oxygen Therapy SpO2: 100 % O2 Device: None (Room air) Pain: Pain Assessment Pain Assessment: 0-10 Pain Score: 8  Pain Location: Back Pain Orientation: Lower;Mid Pain Intervention(s): Repositioned;Rest (Pt pre-medicated prior to session)  See FIM for current functional status  Therapy/Group: Individual Therapy  Rada Hay Rada Hay, PT, DPT 12/19/2013, 7:55 AM

## 2013-12-19 NOTE — Progress Notes (Signed)
Social Work Patient ID: Kevin [name], male   DOB: 1940/11/07, 73 y.o.   MRN: [id number]  Lennart Pall, LCSW Social Worker Signed  Patient Care Conference Service date: 12/18/2013 3:38 PM  Inpatient RehabilitationTeam Conference and Plan of Care Update Date: 12/18/2013   Time: 3:15 PM     Patient Name: Kevin [name]       Medical Record Number: [id number]   Date of Birth: [birth year] Sex: Male         Room/Bed: 4W11C/4W11C-01 Payor Info: Payor: MEDICARE / Plan: MEDICARE PART A AND B / Product Type: *No Product type* /   Admitting Diagnosis: traumatic sdh   Admit Date/Time:  12/13/2013  2:59 PM Admission Comments: No comment available   Primary Diagnosis:  <principal problem not specified> Principal Problem: <principal problem not specified>    Patient Active Problem List     Diagnosis  Date Noted   .  Systolic dysfunction  51/88/4166   .  Syncope  12/14/2013   .  Traumatic subdural hemorrhage  12/13/2013   .  Ventricular bigeminy  12/12/2013   .  Subdural hematoma  12/05/2013   .  Chronic subdural hematoma  12/05/2013   .  Subdural hematoma without coma  12/04/2013   .  Amputee, above knee (left)  01/11/2012   .  Renal failure  01/11/2012   .  Leucocytosis  01/11/2012   .  DVT, femoral, acute  01/11/2012   .  DVT of axillary vein, acute  01/04/2012   .  Acute pulmonary embolism  01/04/2012   .  Renal mass, right  01/03/2012   .  Severe sepsis(995.92)  01/02/2012   .  Septic shock(785.52)  01/02/2012   .  Hypotension  01/02/2012   .  Right ventricular failure  01/02/2012   .  Ischemic leg  12/31/2011   .  HTN (hypertension)  12/31/2011   .  CAD (coronary artery disease)  12/31/2011   .  CHF (congestive heart failure)  12/31/2011   .  Tobacco abuse  12/31/2011   .  Hyperlipidemia  12/31/2011     Expected Discharge Date: Expected Discharge Date: 01/02/14  Team Members Present: Physician leading conference: Dr. Alger Simons Social Worker Present: Lennart Pall,  LCSW Nurse Present: Rozetta Nunnery, RN PT Present: Melene Plan, Cottie Banda, PT OT Present: Roanna Epley, [place], OT;Kayla [place], OT SLP Present: Weston Anna, SLP PPS Coordinator present : Daiva Nakayama, RN, CRRN        Current Status/Progress  Goal  Weekly Team Focus   Medical     left AKA, left SDH, cardiomyopathy  improve activity tolerance  pain control, bp/cv mgt   Bowel/Bladder     continent bowel/bladder-uses urinal  remain continent  offer urinal , toilet assistance as needed   Swallow/Nutrition/ Hydration     Dys. 3 textures with thin liquids, Min A   Supervision  utilization of small bites and sips   ADL's     min A UB dressing, total assist LB dressing, mod assist bathing, S sitting balance with UE supported, min-max sitting balance w/o UE supported  supervision overall and min assist tub and toilet transfers  sitting balance, functional transfers, activity tolerance, attention, awareness, strengthening, sit<>stand   Mobility     modA overall for bed mobility, supervision sitting balance; has not performed transfer yet due to activity tolerance/vitals  supervision overall  education, safety, activity tolerance, functional transfers, wheelchair mobility, balance, strengthening   Communication  Safety/Cognition/ Behavioral Observations    Mod-Max A   supervision for orientation, min A for problem solving, memory, attention and awareness   sustained attention, emergent awareness, problem solving, memory   Pain     7-8 out of 10 headache  less than 3 out of 10  offer pain medication as needed- percocet added   Skin     Skin CDI  Skin remains free of breakdown  continue to assess daily    Rehab Goals Patient on target to meet rehab goals: Yes *See Care Plan and progress notes for long and short-term goals.    Barriers to Discharge:  fatigue, pain      Possible Resolutions to Barriers:    pain mgt, family ed, supervison/min assist goals at  w/c level      Discharge Planning/Teaching Needs:    home with son to provide 24/7 assistance      Team Discussion:    Better today overall which could be due to increase in pain meds.  Trial of sliding board tfs and it went well.  Supervision to min assist w/c level goals.  Not sure how he was entering home PTA as there is no ramp present and pt was primarily w/c level (did not use prosthesis).     Revisions to Treatment Plan:    None    Continued Need for Acute Rehabilitation Level of Care: The patient requires daily medical management by a physician with specialized training in physical medicine and rehabilitation for the following conditions: Daily direction of a multidisciplinary physical rehabilitation program to ensure safe treatment while eliciting the highest outcome that is of practical value to the patient.: Yes Daily medical management of patient stability for increased activity during participation in an intensive rehabilitation regime.: Yes Daily analysis of laboratory values and/or radiology reports with any subsequent need for medication adjustment of medical intervention for : Post surgical problems;Neurological problems  [name] 12/18/2013, 3:38 PM

## 2013-12-19 NOTE — Progress Notes (Signed)
[place] PHYSICAL MEDICINE & REHABILITATION     PROGRESS NOTE    Subjective/Complaints: Better participation in therapies yesterday. Fairly alert. Slept well last night.  Objective: Vital Signs: Blood pressure 129/76, pulse 69, temperature 98.2 F (36.8 C), temperature source Oral, resp. rate 18, height 5\' 7"  (1.702 m), weight 93.486 kg (206 lb 1.6 oz), SpO2 100.00%. No results found. No results found for this basename: WBC, HGB, HCT, PLT,  in the last 72 hours  Recent Labs  12/17/13 0538 12/18/13 0536  NA 129* 133*  K 5.4* 4.4  CL 94* 97  GLUCOSE 86 84  BUN 30* 28*  CREATININE 1.61* 1.51*  CALCIUM 8.5 8.6   CBG (last 3)  No results found for this basename: GLUCAP,  in the last 72 hours  Wt Readings from Last 3 Encounters:  12/19/13 93.486 kg (206 lb 1.6 oz)  12/13/13 93 kg (205 lb 0.4 oz)  12/13/13 93 kg (205 lb 0.4 oz)    Physical Exam:  HENT: dentition fair Left curvilinear craniotomy site with staples intact. Borderline dentition--missing numerous teeth.  Eyes:  Pupils reactive to light. anicteric  Neck: Neck supple. No thyromegaly present.  Cardiovascular: Regular rhythm. HR 75-80  Respiratory: Effort normal and breath sounds normal. No respiratory distress. No rales  GI: Soft. Bowel sounds are normal. He exhibits no distension.  Musculoskeletal:  Edematous left aka stump. RLE with evidence of multiple old healed abrasions on shin and knee. Neurological:  Flat, but  alert. Makes eye contact with cueing. Can follow simple commands.  Pupils reactive. RUE and RLE 3- to 3/5. 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 Physiatrist is providing close team supervision and 24 hour  management of active medical problems listed below. Physiatrist and rehab team continue to assess barriers to discharge/monitor patient progress toward functional and medical goals. FIM: FIM - Bathing Bathing Steps Patient Completed: Chest;Abdomen;Left Arm;Right Arm;Front perineal area;Right upper leg;Left upper leg Bathing: 1: Two helpers (+2 for standing)  FIM - Upper Body Dressing/Undressing Upper body dressing/undressing steps patient completed: Thread/unthread right sleeve of pullover shirt/dresss;Thread/unthread left sleeve of pullover shirt/dress;Put head through opening of pull over shirt/dress;Pull shirt over trunk Upper body dressing/undressing: 4: Steadying assist FIM - Lower Body Dressing/Undressing Lower body dressing/undressing: 1: Two helpers (+2 for sit<>stand with steady)  FIM - Toileting Toileting: 0: Activity did not occur  FIM - Air cabin crew Transfers: 0-Activity did not occur  FIM - Control and instrumentation engineer Devices: Bed rails;HOB elevated Bed/Chair Transfer: 3: Supine > Sit: Mod A (lifting assist/Pt. 50-74%/lift 2 legs;4: Sit > Supine: Min A (steadying pt. > 75%/lift 1 leg)  FIM - Locomotion: Wheelchair Locomotion: Wheelchair: 0: Activity did not occur FIM - Locomotion: Ambulation Ambulation/Gait Assistance: Not tested (comment) Locomotion: Ambulation: 0: Activity did not occur  Comprehension Comprehension Mode: Auditory Comprehension: 3-Understands basic 50 - 74% of the time/requires cueing 25 - 50%  of the time  Expression Expression Mode: Verbal Expression: 3-Expresses basic 50 - 74% of the time/requires cueing 25 - 50% of the time. Needs to repeat parts of sentences.  Social Interaction Social Interaction: 3-Interacts appropriately 50 - 74% of the time - May be physically or verbally inappropriate.  Problem Solving Problem Solving: 2-Solves basic 25 - 49% of the  time - needs direction more than half the time to  initiate, plan or complete simple activities  Memory Memory: 2-Recognizes or recalls 25 - 49% of the time/requires cueing 51 - 75% of the time  Medical Problem List and Plan:  1. Functional deficits secondary to L frontoparietal SDH past falls in setting of anticoagulation.  2. DVT Prophylaxis/Anticoagulation: Mechanical: Antiembolism stockings, thigh (TED hose) Bilateral lower extremities  Sequential compression devices, below knee Bilateral lower extremities  3. Chronic pain?/ Headaches: seems to have responded to percocet with decreased pain and better participation in therapy. Let's see if this continues 4. Mood: Monitor for any evidence of anxiety. ? valium for anxiety at home. LCSW to follow for evaluation and supportive  5. Neuropsych: This patient is not capable of making decisions on his own behalf.  6. Skin/Wound Care: Routine pressure relief measures. Monitor for signs of breakdown.  7. Fluids/Electrolytes/Nutrition: Monitor I/O for adequate intake. Offer nutritional supplements if intake poor.  8. CAD with NISCM:Off Xarelto due to falls. Will monitor daily weights for stability as well as signs of overload. Low salt diet.  Marland Kitchen Resumed Lipitor.    -need apical recording of HR  -EP consult recs noted--consvt care   -continue low dose coreg, avoid diuretics 9. Seizure prophylaxis: Continue Keppra bid.  10. Hyponatremia: likely central, may some volume depletion  -sodium up to 133  -continue to follow serially--recheck Thursday    11. Unresponsive episode: volume depletion repsonded to IVF  -ECHO---with EF 20-25% which was a big change from prior study  -encourage adequate PO intake      LOS (Days) 6 A FACE TO FACE EVALUATION WAS PERFORMED  Murphy Duzan T 12/19/2013 7:46 AM

## 2013-12-19 NOTE — Progress Notes (Signed)
Speech Language Pathology Daily Session Note  Patient Details  Name: Kevin [name] MRN: [id number] Date of Birth: 07-21-1940  Today's Date: 12/19/2013 SLP Individual Time: 0830-0900 SLP Individual Time Calculation (min): 30 min  Short Term Goals: Week 1: SLP Short Term Goal 1 (Week 1): Pt will focus attention to external stimuli with Min assist repeats SLP Short Term Goal 2 (Week 1): Pt will initiate verbal and functional tasks with Mod assist  SLP Short Term Goal 3 (Week 1): Pt will sustain attention to basic functional tasks for 45-60 seconds with Mod assist  SLP Short Term Goal 4 (Week 1): Pt will utilize external aids for orientation with Mod assist  SLP Short Term Goal 5 (Week 1): Patient will label 2 physical and 1 cognitive change with Mod question cues  Skilled Therapeutic Interventions: Skilled treatment focused on cognitive and swallowing goals. SLP facilitated session with Mod multimodal cueing for basic problem solving throughout set-up of breakfast tray. Pt consumed breakfast consisting of Dys 3 textures and thin liquids with supervision level cueing from therapist both for utilization of swallowing strategies and sustained attention to self-feeding task. Delayed cough was noted x1 after pt consumed consecutive cup sips. Continue plan of care.   FIM:  Comprehension Comprehension Mode: Auditory Comprehension: 3-Understands basic 50 - 74% of the time/requires cueing 25 - 50%  of the time Expression Expression Mode: Verbal Expression: 4-Expresses basic 75 - 89% of the time/requires cueing 10 - 24% of the time. Needs helper to occlude trach/needs to repeat words. Social Interaction Social Interaction: 4-Interacts appropriately 75 - 89% of the time - Needs redirection for appropriate language or to initiate interaction. Problem Solving Problem Solving: 3-Solves basic 50 - 74% of the time/requires cueing 25 - 49% of the time Memory Memory: 2-Recognizes or recalls 25 - 49% of  the time/requires cueing 51 - 75% of the time FIM - Eating Eating Activity: 5: Supervision/cues  Pain Pain Assessment Pain Assessment: 0-10 Pain Score: 8  Pain Location: Back (head lft) Pain Orientation: Lower;Mid Pain Descriptors / Indicators: Aching Patients Stated Pain Goal: 5 Pain Intervention(s): Medication (See eMAR);Repositioned  Therapy/Group: Individual Therapy   [name], M.A. CCC-SLP [phone]  [name] 12/19/2013, 11:42 AM

## 2013-12-19 NOTE — Progress Notes (Signed)
Occupational Therapy Note  Patient Details  Name: Kevin [name] MRN: [id number] Date of Birth: November 06, 1940  Today's Date: 12/19/2013 OT Missed Time: 22 Minutes Missed Time Reason: Patient fatigue;Patient unwilling/refused to participate without medical reason  Patient missing 22 min of skilled OT secondary to nausea and pain. Therapist attempted multiple times to make up missed minutes however patient continued to request to "rest" until tomorrow. RN notified.    [name] N 12/19/2013, 2:51 PM

## 2013-12-20 ENCOUNTER — Inpatient Hospital Stay (HOSPITAL_COMMUNITY): Payer: Medicare Other | Admitting: Speech Pathology

## 2013-12-20 ENCOUNTER — Encounter (HOSPITAL_COMMUNITY): Payer: Medicare Other

## 2013-12-20 ENCOUNTER — Inpatient Hospital Stay (HOSPITAL_COMMUNITY): Payer: Medicare Other

## 2013-12-20 ENCOUNTER — Inpatient Hospital Stay (HOSPITAL_COMMUNITY): Payer: Medicare Other | Admitting: *Deleted

## 2013-12-20 DIAGNOSIS — I5022 Chronic systolic (congestive) heart failure: Secondary | ICD-10-CM

## 2013-12-20 MED ORDER — FLEET ENEMA 7-19 GM/118ML RE ENEM
1.0000 | ENEMA | Freq: Once | RECTAL | Status: AC
  Administered 2013-12-20: 18:00:00 via RECTAL
  Filled 2013-12-20: qty 1

## 2013-12-20 NOTE — Progress Notes (Signed)
Speech Language Pathology Daily Session Note  Patient Details  Name: Kevin [name] MRN: [id number] Date of Birth: 04-12-1940  Today's Date: 12/20/2013 SLP Individual Time: 1300-1400 SLP Individual Time Calculation (min): 60 min  Short Term Goals: Week 1: SLP Short Term Goal 1 (Week 1): Pt will focus attention to external stimuli with Min assist repeats SLP Short Term Goal 2 (Week 1): Pt will initiate verbal and functional tasks with Mod assist  SLP Short Term Goal 3 (Week 1): Pt will sustain attention to basic functional tasks for 45-60 seconds with Mod assist  SLP Short Term Goal 4 (Week 1): Pt will utilize external aids for orientation with Mod assist  SLP Short Term Goal 5 (Week 1): Patient will label 2 physical and 1 cognitive change with Mod question cues  Skilled Therapeutic Interventions: Skilled treatment session focused on cognitive-linguistic goals. Upon arrival, patient was sitting upright in his wheelchair and reported fatigue and stomach pain. RN made aware and reported she was going to give the patient an enema after therapy.  Patient was perseverative on stomach pain throughout the first half of the session and required encouragement for participation. Patient required total A to recall his current medications and their functions and for problem solving in regards to medication management at home during a functional conversation but recalled events from previous therapy sessions with Min A question cues. Patient was then transferred to bed in hopes of decreasing discomfort and increasing attention and participation to tasks. Patient participated in a basic money management task and required Min A multimodal cues for functional problem solving. Patient left in bed with bed alarm on and all needs within reach. Continue with current plan of care.    FIM:  Comprehension Comprehension Mode: Auditory Comprehension: 3-Understands basic 50 - 74% of the time/requires cueing 25 - 50%   of the time Expression Expression Mode: Verbal Expression: 4-Expresses basic 75 - 89% of the time/requires cueing 10 - 24% of the time. Needs helper to occlude trach/needs to repeat words. Social Interaction Social Interaction: 3-Interacts appropriately 50 - 74% of the time - May be physically or verbally inappropriate. Problem Solving Problem Solving: 2-Solves basic 25 - 49% of the time - needs direction more than half the time to initiate, plan or complete simple activities Memory Memory: 2-Recognizes or recalls 25 - 49% of the time/requires cueing 51 - 75% of the time  Pain Reports pain in his stomach due to constipation, unable to rate. RN made aware and patient repositioned   Therapy/Group: Individual Therapy  Hadi Dubin, [place] 12/20/2013, 3:06 PM

## 2013-12-20 NOTE — Plan of Care (Signed)
Problem: RH BOWEL ELIMINATION Goal: RH STG MANAGE BOWEL WITH ASSISTANCE STG Manage Bowel with minimal Assistance.  Outcome: Not Progressing Last bowel movement 10/5 order to give enema today after therapy

## 2013-12-20 NOTE — Progress Notes (Signed)
Occupational Therapy Session Note  Patient Details  Name: Kevin [name] MRN: [id number] Date of Birth: December 04, 1940  Today's Date: 12/20/2013 OT Individual Time: 1007-1105 and 4401-0272 OT Individual Time Calculation (min): 58 min and 20 min     Short Term Goals: Week 1:  OT Short Term Goal 1 (Week 1): Pt.  will perform rolling with min assist during bathing OT Short Term Goal 2 (Week 1): Pt. will sit EOB with midline control for 10 minutes OT Short Term Goal 3 (Week 1): Pt. will bath UB in sitting with minimal assist for balance and SBA for bathing OT Short Term Goal 4 (Week 1): Pt. will perform lateral lean with min assist for peri care EOB OT Short Term Goal 5 (Week 1): Pt. will transfer to toilet with mod assist.   Skilled Therapeutic Interventions/Progress Updates:    Session 1: Pt seen for ADL retraining with focus on sit<>stand, attention to task, activity tolerance, and postural control. Pt received sitting in w/c. Completed bathing and dressing from w/c level. Pt required increased time and max multimodal cues for completing sit<>stand during self care tasks as pt attempting to complete lateral leans. Required +2 assist for sit<>stand 3x with pt demonstrating flexed posture while holding onto sink. Pt tolerating standing up to 5 seconds. Pt propelled self around room to retrieve dirty clothes and place in bag with emphasis on anterior weight shift to increase independence with functional transfers and LB dressing. Discussed having pt complete laundry task in afternoon session with emphasis on pt recalling task. Pt left sitting in w/c with all needs in reach.   Session 2: Pt received supine in bed requesting urinal. Pt required assistance to retrieve then able to manage pants down partially to use urinal with increased time and min cues for problem solving. Pt declining getting OOB despite max cues. Pt recalled therapeutic activity of completing laundry discussed in AM session with  increased time. Engaged in therapeutic conversation in regards to intellectual awareness, anticipatory awareness, and goals of therapy. Pt with good intellectual awareness of deficits, however unable to relate deficits to goals of therapy. Pt reporting "I'll get up and walk out of here when I feel like it." Pt declining participation in therapy this afternoon secondary to fatigue and pain in stomach. Pt left supine in bed with all needs in reach. Will follow-up as able.   Therapy Documentation Precautions:  Precautions Precautions: Fall Precaution Comments: Pt with Lt AKA; wears prosthesis, burr hole evacuation Required Braces or Orthoses: Other Brace/Splint Other Brace/Splint: prosthesis Restrictions Weight Bearing Restrictions: No Other Position/Activity Restrictions: Son reports pt. stopped using prosthesis and began using wheelchair as primary mod of mobility due to pain in shoulders while using RW General: General OT Amount of Missed Time: 15 Minutes Vital Signs:   Pain:  Pt reports pain in   See FIM for current functional status  Therapy/Group: Individual Therapy  Duayne Cal 12/20/2013, 2:28 PM

## 2013-12-20 NOTE — Progress Notes (Signed)
Social Work Patient ID: Kevin [name], male   DOB: July 05, 1940, 73 y.o.   MRN: [id number]  Have reviewed team conference with both pt and son (via phone).  Both aware and agreeable with targeted d/c date of 10/21 with minimal assist wheelchair level goals.  Son reports that, PTA, they were getting pt into the home by taking two people to bump him up two steps in his wheelchair and notes no difficulty doing this.  Son denies any concerns about being able to manage father's care needs at home.  Will continue to follow for support and d/c planning needs.  Dmarcus Decicco, LCSW

## 2013-12-20 NOTE — Progress Notes (Signed)
PHYSICAL MEDICINE & REHABILITATION     PROGRESS NOTE    Subjective/Complaints: Still with activity tolerance issues. Fatigues quite easily. Headaches have been better for the most part, but pain remains an issue at times  Objective: Vital Signs: Blood pressure 108/70, pulse 68, temperature 98.2 F (36.8 C), temperature source Oral, resp. rate 18, height 5\' 7"  (1.702 m), weight 94.3 kg (207 lb 14.3 oz), SpO2 99.00%. No results found. No results found for this basename: WBC, HGB, HCT, PLT,  in the last 72 hours  Recent Labs  12/18/13 0536  NA 133*  K 4.4  CL 97  GLUCOSE 84  BUN 28*  CREATININE 1.51*  CALCIUM 8.6   CBG (last 3)  No results found for this basename: GLUCAP,  in the last 72 hours  Wt Readings from Last 3 Encounters:  12/20/13 94.3 kg (207 lb 14.3 oz)  12/13/13 93 kg (205 lb 0.4 oz)  12/13/13 93 kg (205 lb 0.4 oz)    Physical Exam:  HENT: dentition fair Left curvilinear craniotomy site with staples intact. Borderline dentition--missing numerous teeth.  Eyes:  Pupils reactive to light. anicteric  Neck: Neck supple. No thyromegaly present.  Cardiovascular: Regular rhythm. HR 75-80  Respiratory: Effort normal and breath sounds normal. No respiratory distress. No rales  GI: Soft. Bowel sounds are normal. He exhibits no distension.  Musculoskeletal:  Edematous left aka stump. RLE with evidence of multiple old healed abrasions on shin and knee. Neurological:  Flat, but  alert. Makes eye contact with cueing. Can follow simple commands.  Pupils reactive. RUE and RLE 3- to 3/5. 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 Physiatrist is providing close team supervision and  24 hour management of active medical problems listed below. Physiatrist and rehab team continue to assess barriers to discharge/monitor patient progress toward functional and medical goals.  May need to pursue another level of rehab due to poor activity tolerance   FIM: FIM - Bathing Bathing Steps Patient Completed: Chest;Abdomen;Left Arm;Right Arm;Front perineal area;Right upper leg;Left upper leg Bathing: 4: Min-Patient completes 8-9 30f 10 parts or 75+ percent  FIM - Upper Body Dressing/Undressing Upper body dressing/undressing steps patient completed: Thread/unthread right sleeve of pullover shirt/dresss;Thread/unthread left sleeve of pullover shirt/dress;Put head through opening of pull over shirt/dress;Pull shirt over trunk Upper body dressing/undressing: 5: Set-up assist to: Obtain clothing/put away FIM - Lower Body Dressing/Undressing Lower body dressing/undressing steps patient completed: Thread/unthread left pants leg Lower body dressing/undressing: 2: Max-Patient completed 25-49% of tasks  FIM - Toileting Toileting: 0: Activity did not occur  FIM - Air cabin crew Transfers: 0-Activity did not occur  FIM - Control and instrumentation engineer Devices: Sliding board Bed/Chair Transfer: 3: Supine > Sit: Mod A (lifting assist/Pt. 50-74%/lift 2 legs  FIM - Locomotion: Wheelchair Locomotion: Wheelchair: 0: Activity did not occur FIM - Locomotion: Ambulation Ambulation/Gait Assistance: Not tested (comment) Locomotion: Ambulation: 0: Activity did not occur  Comprehension Comprehension Mode: Auditory Comprehension: 3-Understands basic 50 - 74% of the time/requires cueing 25 - 50%  of the time  Expression Expression Mode: Verbal Expression: 4-Expresses basic 75 - 89% of the time/requires cueing 10 - 24% of the time. Needs helper to occlude trach/needs to repeat words.  Social Interaction Social Interaction: 4-Interacts appropriately  75 - 89% of the time  - Needs redirection for appropriate language or to initiate interaction.  Problem Solving Problem Solving: 3-Solves basic 50 - 74% of the time/requires cueing 25 - 49% of the time  Memory Memory: 2-Recognizes or recalls 25 - 49% of the time/requires cueing 51 - 75% of the time  Medical Problem List and Plan:  1. Functional deficits secondary to L frontoparietal SDH past falls in setting of anticoagulation.  2. DVT Prophylaxis/Anticoagulation: Mechanical: Antiembolism stockings, thigh (TED hose) Bilateral lower extremities  Sequential compression devices, below knee Bilateral lower extremities  3. Chronic pain?/ Headaches: seems to have responded to percocet with decreased pain and better participation in therapy. Let's see if this continues 4. Mood: Monitor for any evidence of anxiety. ? valium for anxiety at home. LCSW to follow for evaluation and supportive  5. Neuropsych: This patient is not capable of making decisions on his own behalf.  6. Skin/Wound Care: Routine pressure relief measures. Monitor for signs of breakdown.  7. Fluids/Electrolytes/Nutrition: Monitor I/O for adequate intake. Offer nutritional supplements if intake poor.  8. CAD with NISCM:Off Xarelto due to falls. Will monitor daily weights for stability as well as signs of overload. Low salt diet.  Marland Kitchen Resumed Lipitor.    -need apical recording of HR    -EP consult recs noted--consvt care   -off b-blocker per cards, avoid diuretics for now due to volume depletion 9. Seizure prophylaxis: Continue Keppra bid.  10. Hyponatremia: likely central, may some volume depletion  -sodium up to 133  -continue to follow serially--recheck today    11. Unresponsive episode: volume depletion repsonded to IVF  -ECHO---with EF 20-25% which was a big change from prior study  -encourage adequate PO intake      LOS (Days) 7 A FACE TO FACE EVALUATION WAS PERFORMED  Kevin [name] T 12/20/2013 8:06 AM

## 2013-12-20 NOTE — Progress Notes (Signed)
Physical Therapy Note  Patient Details  Name: Kevin [name] MRN: [id number] Date of Birth: Aug 17, 1940 Today's Date: 12/20/2013 Attempted make-up session for missed minutes this week.  Pt resting in bed, awaiting enema.  PT recommended movement and exercise to assist movement of bowels, but pt refused.  Kerilyn Cortner 12/20/2013, 3:13 PM

## 2013-12-20 NOTE — Progress Notes (Signed)
Physical Therapy Session Note  Patient Details  Name: Kevin [name] MRN: [id number] Date of Birth: 1940/11/05  Today's Date: 12/20/2013 PT Individual Time: 0817-0900 PT Individual Time Calculation (min): 43 min   Short Term Goals: Week 1:  PT Short Term Goal 1 (Week 1): Pt will roll R and L at mod A level PT Short Term Goal 2 (Week 1): Pt will perform static sitting balance at S level without UE support PT Short Term Goal 3 (Week 1): Pt will perform dynamic sitting balance with single UE support x 2 mins at min A level PT Short Term Goal 4 (Week 1): Pt will perform SB transfers at max A of single therapist  Skilled Therapeutic Interventions/Progress Updates:    Patient received semi-reclined in bed after finishing breakfast. Session focused on bed mobility, functional transfers, and UE strengthening. Patient with requests to use bed pan, attempted to have patient use BSC, however, patient reports he is in hurry and prefers bed pan. Assisted patient with rolling to B sides to pull down pants and brief and place bed pan, requires modA and use of bed rails to roll. Patient without BM, assisted with rolling to B sides to pull pants up. ModA for supine>sit with HOB flat and use of bedrails, slideboard transfer bed>wheelchair (patient's personal wheelchair, not tilt) with minA and increased time, mod cues for sequencing and technique.  Wheelchair mobility [phone] with B UE and supervision, cues for obstacle negotiation. Slideboard transfer wheelchair<>mat with modA, mod-max cues for anterior weight shift, maintaining R LE on floor, and ideal hand placement. Seated on mat, yoga blocks positioned under each hand and patient performed tricep push ups in sitting with emphasis on using R LE to assist with lifting and glute clearance.  Patient returned to room and left sitting in wheelchair with seatbelt donned and all needs within reach.  Therapy Documentation Precautions:  Precautions Precautions:  Fall Precaution Comments: Pt with Lt AKA; wears prosthesis, burr hole evacuation Required Braces or Orthoses: Other Brace/Splint Other Brace/Splint: prosthesis Restrictions Weight Bearing Restrictions: No Other Position/Activity Restrictions: Son reports pt. stopped using prosthesis and began using wheelchair as primary mod of mobility due to pain in shoulders while using RW General: PT Amount of Missed Time (min): 17 Minutes PT Missed Treatment Reason: Other (Comment) (eating breakfast) Pain: Pain Assessment Pain Assessment: 0-10 Pain Score: 8  Pain Type: Acute pain Pain Location: Head Pain Orientation: Left;Anterior Pain Descriptors / Indicators: Headache Pain Frequency: Intermittent Pain Onset: Gradual Patients Stated Pain Goal: 3 Pain Intervention(s): Medication (See eMAR) (percocet 1 po) Locomotion : Ambulation Ambulation/Gait Assistance: Not tested (comment) Wheelchair Mobility Distance: 125   See FIM for current functional status  Therapy/Group: Individual Therapy  Lillia Abed. Avish Torry, PT, DPT 12/20/2013, 9:25 AM

## 2013-12-21 ENCOUNTER — Inpatient Hospital Stay (HOSPITAL_COMMUNITY): Payer: Medicare Other

## 2013-12-21 ENCOUNTER — Inpatient Hospital Stay (HOSPITAL_COMMUNITY): Payer: Medicare Other | Admitting: Speech Pathology

## 2013-12-21 ENCOUNTER — Inpatient Hospital Stay (HOSPITAL_COMMUNITY): Payer: Medicare Other | Admitting: Physical Therapy

## 2013-12-21 LAB — CBC
HCT: 39.5 % (ref 39.0–52.0)
HEMOGLOBIN: 12.9 g/dL — AB (ref 13.0–17.0)
MCH: 26.7 pg (ref 26.0–34.0)
MCHC: 32.7 g/dL (ref 30.0–36.0)
MCV: 81.6 fL (ref 78.0–100.0)
Platelets: 156 10*3/uL (ref 150–400)
RBC: 4.84 MIL/uL (ref 4.22–5.81)
RDW: 16.2 % — ABNORMAL HIGH (ref 11.5–15.5)
WBC: 5.5 10*3/uL (ref 4.0–10.5)

## 2013-12-21 LAB — URINALYSIS, ROUTINE W REFLEX MICROSCOPIC
Bilirubin Urine: NEGATIVE
GLUCOSE, UA: NEGATIVE mg/dL
KETONES UR: NEGATIVE mg/dL
Nitrite: NEGATIVE
Protein, ur: 30 mg/dL — AB
Specific Gravity, Urine: 1.022 (ref 1.005–1.030)
UROBILINOGEN UA: 0.2 mg/dL (ref 0.0–1.0)
pH: 5 (ref 5.0–8.0)

## 2013-12-21 LAB — URINE MICROSCOPIC-ADD ON

## 2013-12-21 LAB — CLOSTRIDIUM DIFFICILE BY PCR: Toxigenic C. Difficile by PCR: NEGATIVE

## 2013-12-21 MED ORDER — PHENAZOPYRIDINE HCL 100 MG PO TABS
100.0000 mg | ORAL_TABLET | Freq: Three times a day (TID) | ORAL | Status: AC
  Administered 2013-12-21 – 2013-12-23 (×6): 100 mg via ORAL
  Filled 2013-12-21 (×6): qty 1

## 2013-12-21 NOTE — Progress Notes (Signed)
[facility] PHYSICAL MEDICINE & REHABILITATION     PROGRESS NOTE    Subjective/Complaints: Still with limited participation in therapy due to fatigue, pain  Objective: Vital Signs: Blood pressure 126/73, pulse 51, temperature 98.4 F (36.9 C), temperature source Oral, resp. rate 18, height 5\' 7"  (1.702 m), weight 94.3 kg (207 lb 14.3 oz), SpO2 100.00%. No results found. No results found for this basename: WBC, HGB, HCT, PLT,  in the last 72 hours No results found for this basename: NA, K, CL, CO, GLUCOSE, BUN, CREATININE, CALCIUM,  in the last 72 hours CBG (last 3)  No results found for this basename: GLUCAP,  in the last 72 hours  Wt Readings from Last 3 Encounters:  12/20/13 94.3 kg (207 lb 14.3 oz)  12/13/13 93 kg (205 lb 0.4 oz)  12/13/13 93 kg (205 lb 0.4 oz)    Physical Exam:  HENT: dentition fair Left curvilinear craniotomy site with staples intact. dry    Eyes:  Pupils reactive to light. anicteric  Neck: Neck supple. No thyromegaly present.  Cardiovascular: Regular rhythm. HR 75-80  Respiratory: Effort normal and breath sounds normal. No respiratory distress. No rales  GI: Soft. Bowel sounds are normal. He exhibits no distension.  Musculoskeletal:  Edematous left aka stump. RLE with evidence of multiple old healed abrasions on shin and knee. Neurological:  Flat, but  alert. Makes eye contact with cueing. Can follow simple commands.  Pupils reactive. RUE and RLE 3- to 3/5. 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 Physiatrist is providing close team supervision and 24 hour management of active medical problems listed below. Physiatrist and rehab team continue to assess barriers to  discharge/monitor patient progress toward functional and medical goals.  May need to pursue another level of rehab due to poor activity tolerance   FIM: FIM - Bathing Bathing Steps Patient Completed: Chest;Abdomen;Left Arm;Right Arm;Front perineal area;Right upper leg;Left upper leg Bathing: 1: Two helpers (+2 sit<>stand)  FIM - Upper Body Dressing/Undressing Upper body dressing/undressing steps patient completed: Thread/unthread right sleeve of pullover shirt/dresss;Thread/unthread left sleeve of pullover shirt/dress;Put head through opening of pull over shirt/dress;Pull shirt over trunk Upper body dressing/undressing: 5: Set-up assist to: Obtain clothing/put away FIM - Lower Body Dressing/Undressing Lower body dressing/undressing steps patient completed: Thread/unthread left underwear leg;Thread/unthread right underwear leg Lower body dressing/undressing: 3: Mod-Patient completed 50-74% of tasks (completed supine in bed)  FIM - Toileting Toileting: 1: Total-Patient completed zero steps, helper did all 3  FIM - Radio producer Devices: Research officer, trade union Transfers: 2-To toilet/BSC: Max A (lift and lower assist);2-From toilet/BSC: Max A (lift and lower assist)  FIM - Control and instrumentation engineer Devices: Sliding board;Bed rails;Arm rests Bed/Chair Transfer: 3: Supine > Sit: Mod A (lifting assist/Pt. 50-74%/lift 2 legs;4: Bed > Chair or W/C: Min A (steadying Pt. > 75%);3: Chair or W/C > Bed: Mod A (lift or lower assist)  FIM - Locomotion: Wheelchair Distance: 125 Locomotion: Wheelchair: 2: Travels 50 - 149 ft with supervision, cueing or coaxing FIM - Locomotion: Ambulation Ambulation/Gait Assistance: Not tested (comment) Locomotion: Ambulation: 0: Activity did not occur  Comprehension Comprehension Mode: Auditory Comprehension: 3-Understands basic 50 - 74% of the time/requires cueing 25 - 50%  of the  time  Expression  Expression Mode: Verbal Expression: 4-Expresses basic 75 - 89% of the time/requires cueing 10 - 24% of the time. Needs helper to occlude trach/needs to repeat words.  Social Interaction Social Interaction: 3-Interacts appropriately 50 - 74% of the time - May be physically or verbally inappropriate.  Problem Solving Problem Solving: 2-Solves basic 25 - 49% of the time - needs direction more than half the time to initiate, plan or complete simple activities  Memory Memory: 2-Recognizes or recalls 25 - 49% of the time/requires cueing 51 - 75% of the time  Medical Problem List and Plan:  1. Functional deficits secondary to L frontoparietal SDH past falls in setting of anticoagulation.  2. DVT Prophylaxis/Anticoagulation: Mechanical: Antiembolism stockings, thigh (TED hose) Bilateral lower extremities  Sequential compression devices, below knee Bilateral lower extremities  3. Chronic pain?/ Headaches: seems to have responded to percocet with decreased pain and better participation in therapy. Let's see if this continues 4. Mood: Monitor for any evidence of anxiety. ? valium for anxiety at home. LCSW to follow for evaluation and supportive  5. Neuropsych: This patient is not capable of making decisions on his own behalf.  6. Skin/Wound Care: Routine pressure relief measures. Monitor for signs of breakdown.  7. Fluids/Electrolytes/Nutrition: Monitor I/O for adequate intake. Offer nutritional supplements if intake poor.  8. CAD with NISCM:Off Xarelto due to falls. Will monitor daily weights for stability as well as signs of overload. Low salt diet.  Marland Kitchen Resumed Lipitor.    -need apical recording of HR---HR improved    -EP consult recs noted--consvt care   -off b-blocker per cards, avoid diuretics for now due to volume depletion 9. Seizure prophylaxis: Continue Keppra bid.  10. Hyponatremia: likely central, may some volume depletion  -sodium up to 133 most recently  -continue to  follow serially--recheck today    11. Unresponsive episode: volume depletion repsonded to IVF  -ECHO---with EF 20-25% which was a big change from prior study  -encourage adequate PO intake      LOS (Days) 8 A FACE TO FACE EVALUATION WAS PERFORMED  SWARTZ,ZACHARY T 12/21/2013 7:57 AM

## 2013-12-21 NOTE — Progress Notes (Signed)
Physical Therapy Session Note  Patient Details  Name: Kevin [name] MRN: [id number] Date of Birth: 1940/03/27  Today's Date: 12/21/2013 PT Individual Time: 0800-0815 PT Individual Time Calculation (min): 15 min   Short Term Goals: Week 1:  PT Short Term Goal 1 (Week 1): Pt will roll R and L at mod A level PT Short Term Goal 2 (Week 1): Pt will perform static sitting balance at S level without UE support PT Short Term Goal 3 (Week 1): Pt will perform dynamic sitting balance with single UE support x 2 mins at min A level PT Short Term Goal 4 (Week 1): Pt will perform SB transfers at max A of single therapist  Skilled Therapeutic Interventions/Progress Updates:    Pt received semi reclined in bed. Refusing therapy at this time due to abdominal discomfort and desire to "rest up for the next few days." Provided coaxing, education on importance of continued participation in therapies to maximize functional gains. However, pt continuing to state, "I'll do it next week."   Pt therefore missing 45 minutes of skilled physical therapy due to pt refusal. RN notified.  Therapy Documentation Precautions:  Precautions Precautions: Fall Precaution Comments: Pt with Lt AKA; wears prosthesis, burr hole evacuation Required Braces or Orthoses: Other Brace/Splint Other Brace/Splint: prosthesis Restrictions Weight Bearing Restrictions: No Other Position/Activity Restrictions: Son reports pt. stopped using prosthesis and began using wheelchair as primary mod of mobility due to pain in shoulders while using RW General: PT Amount of Missed Time (min): 45 Minutes PT Missed Treatment Reason: Patient unwilling to participate;Other (Comment) (due to abdominal discomfort) Vital Signs: Therapy Vitals Temp: 97.8 F (36.6 C) Temp Source: Oral Pulse Rate: 76 Resp: 18 BP: 114/78 mmHg Patient Position (if appropriate): Lying Oxygen Therapy SpO2: 99 % Pain: Pain Assessment Pain Assessment: 0-10 Pain  Score: 5  Pain Location: Head Pain Orientation: Left Pain Descriptors / Indicators: Aching Pain Onset: On-going Patients Stated Pain Goal: 5 Pain Intervention(s): Medication (See eMAR)  See FIM for current functional status  Therapy/Group: Individual Therapy  Hobble, Malva Cogan 12/21/2013, 3:26 PM

## 2013-12-21 NOTE — Progress Notes (Signed)
Occupational Therapy Session Note  Patient Details  Name: Kevin [name] MRN: [id number] Date of Birth: [birth year]  Today's Date: 12/21/2013 OT Individual Time: 3545-6256 and 1030-1040 OT Individual Time Calculation (min): 60 min and 10 min OT Missed Minutes: 20 min due to refusal and pain    Short Term Goals: Week 1:  OT Short Term Goal 1 (Week 1): Pt.  will perform rolling with min assist during bathing OT Short Term Goal 2 (Week 1): Pt. will sit EOB with midline control for 10 minutes OT Short Term Goal 3 (Week 1): Pt. will bath UB in sitting with minimal assist for balance and SBA for bathing OT Short Term Goal 4 (Week 1): Pt. will perform lateral lean with min assist for peri care EOB OT Short Term Goal 5 (Week 1): Pt. will transfer to toilet with mod assist.   Skilled Therapeutic Interventions/Progress Updates:    Session 1: Pt seen for ADL retraining with focus on bed mobility, LB dressing, functional transfers, and activity tolerance. Pt received supine in bed declining bathing as he was "cleaned up" at 5:00 AM. Completed LB dressing of donning briefs while supine in bed, rolling L<>R to manage around waist. Pt required min-supervision for rolling in bed and assist to completely manage around waist. Pt with poor safety awareness during rolling as he attempted to roll multiple times when too close to edge of bed. Pt requesting to have BM. Completed SBT bed<>BSC with max assist and max cues for sequencing. Required total assist for clothing management and hygiene using lateral lean technique, pt assisting ~20% of task. Pt requesting to go back to bed despite encouragement for sitting up in w/c. Pt assisted back to bed and left with all needs in reach.   Session 2: Attempted scheduled therapy time, however pt declining d/t headache after having staples removed. Returned 30 min later and pt reporting stomach pain along with headache, declining therapy. Pt required max encouragement for  participation. Pt perseverating on enema, however unable to receive one at this time. Pt agreeable to suppository. Pt completed rolling L<>R at supervision level with use of grab bars and min cues for sequencing as therapist assisted with clothing management (pt assisting <10% of task). Pt required rest breaks during rolling d/t fatigue. Pt declining further participation following rolling in bed. Pt left supine in bed with all needs in reach.   Therapy Documentation Precautions:  Precautions Precautions: Fall Precaution Comments: Pt with Lt AKA; wears prosthesis, burr hole evacuation Required Braces or Orthoses: Other Brace/Splint Other Brace/Splint: prosthesis Restrictions Weight Bearing Restrictions: No Other Position/Activity Restrictions: Son reports pt. stopped using prosthesis and began using wheelchair as primary mod of mobility due to pain in shoulders while using RW General:   Vital Signs: Supine: BP 126/73 and HR 51 Following transfers: BP-118/82 and HR-62  Pain:  Pt reports headache however would not rate pain.  See FIM for current functional status  Therapy/Group: Individual Therapy  Duayne Cal 12/21/2013, 7:21 AM

## 2013-12-21 NOTE — Progress Notes (Signed)
Pt c/o pressure and need to urinate however unable to void on own. Scan for < 350 however due to pressure and pain in bladder; I+O cath for cloudy amber fluid with small amount of blood at end of catheter. Spoke with PA regarding pain and new orders received. Urine specimen sent to lab for culture and UA/supp given for belly pain and feel need to hahave BM with small results noted. Margarito Liner

## 2013-12-21 NOTE — Progress Notes (Signed)
Speech Language Pathology Weekly Progress and Session Note  Patient Details  Name: Kevin [name] MRN: [id number] Date of Birth: 1940-05-21  Beginning of progress report period: December 14, 2013 End of progress report period: December 21, 2013  Today's Date: 12/21/2013 SLP Individual Time: 1400-1500 SLP Individual Time Calculation (min): 60 min  Short Term Goals: Week 1: SLP Short Term Goal 1 (Week 1): Pt will focus attention to external stimuli with Min assist repeats SLP Short Term Goal 1 - Progress (Week 1): Met SLP Short Term Goal 2 (Week 1): Pt will initiate verbal and functional tasks with Mod assist  SLP Short Term Goal 2 - Progress (Week 1): Met SLP Short Term Goal 3 (Week 1): Pt will sustain attention to basic functional tasks for 45-60 seconds with Mod assist  SLP Short Term Goal 3 - Progress (Week 1): Met SLP Short Term Goal 4 (Week 1): Pt will utilize external aids for orientation with Mod assist  SLP Short Term Goal 4 - Progress (Week 1): Met SLP Short Term Goal 5 (Week 1): Patient will label 2 physical and 1 cognitive change with Mod question cues SLP Short Term Goal 5 - Progress (Week 1): Met    New Short Term Goals: Week 2: SLP Short Term Goal 1 (Week 2): Pt will initiate verbal and functional tasks with Min A multimodal cues.  SLP Short Term Goal 2 (Week 2): Patient will sustain attention to functional tasks for 10 minutes with Mod A multimodal cues.  SLP Short Term Goal 3 (Week 2): Pt will utilize external aids to recall new, daily information with Min A multimodal cues.  SLP Short Term Goal 4 (Week 2): Patient will label 2 physical and 2 cognitive deficits with Min A question cues SLP Short Term Goal 5 (Week 2): Patient will consume current diet with minimal overt s/s of aspiration with supervision verbal cues for utilization of swallowing compensatory strategies. SLP Short Term Goal 6 (Week 2): Patient will demonstrate efficient mastication with trials of regular  textures with supervision verbal cues.   Weekly Progress Updates: Patient has made functional but inconsistent gains and has met 5 of 5 STG's this reporting period due to increased attention, orientation, initiation, intellectual awareness and working memory. Currently, patient requires overall Mod A to perform basic and familiar tasks safely. Patient is also consuming Dys. 3 textures with thin liquids with minimal overt s/s of aspiration and requires Min A for utilization of small bites/sips. Patient's biggest barriers to progress continue to be decreased participation due to pain and fatigue. Patient education ongoing. Patient would benefit from continued skilled SLP intervention to maximize his cognitive-linguistic and swallowing function in order to maximize his overall functional independence prior to discharge.    Intensity: Minumum of 1-2 x/day, 30 to 90 minutes Frequency: 5 out of 7 days Duration/Length of Stay: 01/02/14 Treatment/Interventions: Cueing hierarchy;Cognitive remediation/compensation;Dysphagia/aspiration precaution training;Functional tasks;Environmental controls;Internal/external aids;Patient/family education;Therapeutic Activities   Daily Session Skilled Therapeutic Interventions: Skilled treatment session focused on cognitive-linguistic and dysphagia goals. Upon arrival, patient was supine while on the bedpan and required supervision multimodal cues and extra time to complete self-care tasks. Patient required encouragement to participate due to stomach pain and "feeling like a zombie." SLP facilitated session by providing Mod A question cues for intellectual awareness of his current physical and cognitive deficits. Patient reported that he knows his participation has been limited and that he "will do more next week." SLP made a "contract" with the patient stating that he will have  increased particiaption next week and patient "signed it." patient demonstrated sustained attention  to a functional conversation for ~20 minutes with supervision verbal cues. Patient also consumed remainder of lunch tray of Dys. 3 textures with thin liquids without overt s/s of aspiration but required Min verbal cues for use of small bites/sips. Patient left semi-reclined in bed with alarm in place. Continue with current plan of care.    FIM:  Comprehension Comprehension Mode: Auditory Comprehension: 4-Understands basic 75 - 89% of the time/requires cueing 10 - 24% of the time Expression Expression Mode: Verbal Expression: 4-Expresses basic 75 - 89% of the time/requires cueing 10 - 24% of the time. Needs helper to occlude trach/needs to repeat words. Social Interaction Social Interaction: 3-Interacts appropriately 50 - 74% of the time - May be physically or verbally inappropriate. Problem Solving Problem Solving: 2-Solves basic 25 - 49% of the time - needs direction more than half the time to initiate, plan or complete simple activities Memory Memory: 3-Recognizes or recalls 50 - 74% of the time/requires cueing 25 - 49% of the time FIM - Eating Eating Activity: 5: Supervision/cues;5: Set-up assist for open containers;5: Set-up assist for cut food Pain Pain Assessment Pain Assessment: 0-10 Pain Score: 5  Pain Location: Head Pain Orientation: Left Pain Descriptors / Indicators: Aching Pain Onset: On-going Patients Stated Pain Goal: 5 Pain Intervention(s): Medication (See eMAR)  Therapy/Group: Individual Therapy  Kevin [name] 12/21/2013, 4:14 PM

## 2013-12-21 NOTE — Consult Note (Signed)
NEUROCOGNITIVE TESTING - [place]   [name] is a 73 year old, right-handed man, who was seen for a brief neurocognitive evaluation to examine his emotional state and mental status in the setting of subdural hematoma.  According to his medical record, he was admitted on 12/04/13 after having cognitive changes.  CT of his head demonstrated a large isodense subdural hematoma on the left frontal and left posterior parietal region with significant mass effect and 76mm midline shift.  He underwent evacuation procedure on 12/05/13.  Follow up CCT demonstrated moderate decrease in size of subdural hematoma and new foci of extra axial hemorrhage along the right parafalcine region.    PROCEDURES: [3 units of 31517 on 12/20/13]  The following tests were performed during today's visit: Mini Mental Status Examination - 2 (brief version - red), Repeatable Battery for the Assessment of Neuropsychological Status (RBANS, form D), Geriatric Anxiety Inventory, and the Geriatric Depression Scale (short form).  Performance validity indicators were also assessed.  Test results are as follows:   MMSE-2 brief Raw Score = 11/16 Description = Impaired   RBANS Indices Scaled Score Percentile Description  Immediate Memory  49 < 1 Profoundly Impaired  Visuospatial/Constructional 50 < 1 Profoundly Impaired  Language 51 < 1 Profoundly Impaired  Attention 53 < 1 Profoundly Impaired  Delayed Memory 68 2 Profoundly Impaired  Total Score 48 < 1 Profoundly Impaired   RBANS Subtests Raw Score Percentile Description  List Learning 12 < 1 Profoundly Impaired  Story Memory 4 < 1 Profoundly Impaired  Figure Copy 9 < 1 Profoundly Impaired  Line Orientation 4 < 1 Profoundly Impaired  Picture Naming 6 < 1 Profoundly Impaired  Semantic Fluency 6 < 1 Profoundly Impaired  Digit Span 7 8 Impaired  Coding 0 < 1 Profoundly Impaired  List Recall 0 < 1 Profoundly Impaired  List Recognition  18 16 Below Average  Story Recall 3 < 1 Profoundly Impaired  Figure recall 8 14 Below Average   Geriatric Depression Scale (short form) Raw Score = 5/15 Description = Mild   Geriatric Anxiety Inventory Raw Score = 13/20 Description = Mild   On a measure of embedded performance validity, Kevin [name] performance was within normal limits and there were no behavioral manifestations to suggest suboptimal effort.  Therefore, the following results likely represent an accurate portrayal of his current cognitive abilities.    Kevin [name] performances were impaired, almost without exception.  He did demonstrate below average abilities for recognition memory and incidental memory for complex visual information.  Also, while still impaired, his simple attention was relatively better than other cognitive abilities.  His scores were profoundly impaired on all other subtests today and his overall mental status was impaired.  Of note, Kevin [name] was quite slow to process information and took an extensive amount of time to reply to prompts.  Such slow processing speed may have adversely impacted his performances on certain subtests, but cannot likely account for all other deficits seen, as many were seen on untimed tasks.  Regarding learning and memory, it is notable that he was able to immediately encode and recall, at most, 3 pieces of information.  From an emotional standpoint, Kevin [name] endorsed items consistent with mild levels of anxiety and depression.  Additionally, he expressed a desire to talk about current frustrations and low mood with a professional.    The cognitive deficits that Kevin [name] demonstrated are consistent with the location of his subdural hematoma and potential damage  caused by midline shift.  It is expected that he will experience at least some level of cognitive recovery over time, though it is unclear whether he will fully return to baseline functioning.  A more thorough  neuropsychological evaluation as an outpatient is recommended in 8-12 months to more completely assess his cognitive strengths and weaknesses and to provide him and his treatment team with recommendations for care management.  Although mild mood disruption could certainly be exacerbating his cognitive difficulties, it is not thought that mood disruption is the sole driving force behind his cognitive changes.  Still, given his endorsement of symptoms and desire to process his emotions, he will be seen in follow-up while on the unit with neuropsychology.  An appointment with Dr. Norton Pastel, PsyD will be set up for this purpose next week.      In light of these findings, the following recommendations are provided.    RECOMMENDATIONS:  Recommendations for treatment team:     When interacting with Kevin [name], directions and information should be provided in a simple, straight forward manner, and the treatment team should avoid giving multiple instructions simultaneously.    Kevin [name] may also benefit from being provided with multiple trials to learn new skills given the noted memory inefficiencies.    To the extent possible, multitasking should be avoided.   Kevin [name] may require more time than typical to process information. The treatment team may benefit from waiting for a verbal response to information before presenting additional information.    Performance will generally be best in a structured, routine, and familiar environment, as opposed to situations involving complex problems.   Recommendations for discharge planning:    Complete a comprehensive neuropsychological evaluation as an outpatient in 8-12 months.  Information on a local neuropsychologist should be included in his discharge paperwork for this purpose.   Given evidence of mild depression and anxiety, Kevin [name] may benefit from participation in individual psychotherapy in order to help him develop more effective coping  strategies to deal with life stressors.  Information on providers in his area should be included in her discharge paperwork.     Maintain engagement in mentally, physically and cognitively stimulating activities.    Strive to maintain a healthy lifestyle (e.g., proper diet and exercise) in order to promote physical, cognitive and emotional health.   Marlane Hatcher, Psy.D.  Clinical Neuropsychologist

## 2013-12-22 ENCOUNTER — Inpatient Hospital Stay (HOSPITAL_COMMUNITY): Payer: Medicare Other | Admitting: Physical Therapy

## 2013-12-22 LAB — GLUCOSE, CAPILLARY
GLUCOSE-CAPILLARY: 120 mg/dL — AB (ref 70–99)
Glucose-Capillary: 90 mg/dL (ref 70–99)
Glucose-Capillary: 109 mg/dL — ABNORMAL HIGH (ref 70–99)
Glucose-Capillary: 130 mg/dL — ABNORMAL HIGH (ref 70–99)

## 2013-12-22 LAB — URINE CULTURE
Colony Count: NO GROWTH
Culture: NO GROWTH

## 2013-12-22 MED ORDER — SORBITOL 70 % SOLN
30.0000 mL | Freq: Every day | Status: DC | PRN
  Administered 2013-12-22 – 2013-12-25 (×2): 30 mL via ORAL
  Filled 2013-12-22 (×2): qty 30

## 2013-12-22 NOTE — Progress Notes (Signed)
Patient had compliant of constipation, provided 10mg  suppository. Will continue to monitor.  Kevin [name]

## 2013-12-22 NOTE — Plan of Care (Signed)
Problem: RH BOWEL ELIMINATION Goal: RH STG MANAGE BOWEL W/MEDICATION W/ASSISTANCE STG Manage Bowel with Medication with minimal Assistance.  Medication with Mod A  Problem: RH BLADDER ELIMINATION Goal: RH STG MANAGE BLADDER WITH ASSISTANCE STG Manage Bladder With minimal Assistance  Outcome: Not Progressing Helper empties urinal  Problem: RH PAIN MANAGEMENT Goal: RH STG PAIN MANAGED AT OR BELOW PT'S PAIN GOAL Pain<5  Outcome: Not Progressing Pain stays above 5

## 2013-12-22 NOTE — Progress Notes (Signed)
Physical Therapy Note  Patient Details  Name: Kevin [name] MRN: [id number] Date of Birth: [birth year] Today's Date: 12/22/2013   Pt initially refusing therapy due to inability to urinate despite urge to do so. RN therefore performed bladder scan, which indicated no urinary retention. Returned to pt room after bladder scan in attempt to initiate session; however, pt continually stating, "I told you, I'm going to get up on Monday. It's not Monday yet."    Pt missing 45 minutes of skilled physical therapy treatment for no specific reason despite max coaxing and education.  Stefano Gaul 12/22/2013, 5:20 PM

## 2013-12-22 NOTE — Progress Notes (Signed)
Patient ID: Kevin [name], male   DOB: [birth year], 73 y.o.   MRN: [id number]   [place] [facility]ICINE & REHABILITATION     PROGRESS NOTE   12/22/13.  Subjective/Complaints:  73 y/o admit for CIR with functional deficits secondary to L frontoparietal SDH past falls in setting of anticoagulation.   Alert; no complaints.  Past Medical History  Diagnosis Date  . Arthritis   . Hypertension   . CHF (congestive heart failure)     a. NICM/acute CHF 2007 with EF 10-20%. b. Improved EF 50-55% by echo 12/2011.  Marland Kitchen Hyperlipidemia   . Coronary artery disease     a. NSTEMI 2007 with cath showing mild nonobstructive CAD by cath 2007 (40% mLAD, 40%mRCA) at time of dx of NICM.  Marland Kitchen Tortuous aorta     a. Noted by cath 2007, recommendation for OP [place].  Marland Kitchen BPH (benign prostatic hyperplasia)   . Wide-complex tachycardia     a. Noted 2007.  . Obesity   . Valvular heart disease     a. Mod MR by echo 06/2005 but no significant MR 2013.  Marland Kitchen DVT (deep venous thrombosis)   . Pulmonary embolism     Patient Vitals for the past 24 hrs:  BP Temp Temp src Pulse Resp SpO2  12/22/13 0620 110/84 mmHg 98.5 F (36.9 C) Oral 73 18 100 %  12/21/13 2227 121/82 mmHg 98.2 F (36.8 C) Oral 74 18 99 %  12/21/13 1430 114/78 mmHg 97.8 F (36.6 C) Oral 76 18 99 %     Intake/Output Summary (Last 24 hours) at 12/22/13 0925 Last data filed at 12/22/13 0147  Gross per 24 hour  Intake    120 ml  Output    450 ml  Net   -330 ml     Objective: Vital Signs: Blood pressure 110/84, pulse 73, temperature 98.5 F (36.9 C), temperature source Oral, resp. rate 18, height 5\' 7"  (1.702 m), weight 94.3 kg (207 lb 14.3 oz), SpO2 100.00%. No results found.  Recent Labs  12/21/13 0930  WBC 5.5  HGB 12.9*  HCT 39.5  PLT 156   No results found for this basename: NA, K, CL, CO, GLUCOSE, BUN, CREATININE, CALCIUM,  in the last 72 hours CBG (last 3)   Recent Labs  12/22/13 0711  GLUCAP 90    Wt Readings from  Last 3 Encounters:  12/20/13 94.3 kg (207 lb 14.3 oz)  12/13/13 93 kg (205 lb 0.4 oz)  12/13/13 93 kg (205 lb 0.4 oz)    Physical Exam:  HENT: dentition fair Left curvilinear craniotomy site with staples out, clean Eyes:  Pupils reactive to light. anicteric  Neck: Neck supple. No thyromegaly present.  Cardiovascular: Regular rhythm. HR 75-80  Respiratory: Effort normal and breath sounds normal. No respiratory distress. No rales  GI: Soft. Bowel sounds are normal. He exhibits no distension.  Musculoskeletal:  Edematous left aka stump. RLE with evidence of multiple old healed abrasions on shin and knee. Trace edema Neurological:  Flat, but  alert. RUE and RLE 3- to 3/5. Skin:   Psych: flat, cooperative   Medical Problem List and Plan:   1. Functional deficits secondary to L frontoparietal SDH past falls in setting of anticoagulation.  2. DVT Prophylaxis/Anticoagulation: Mechanical: Antiembolism stockings, thigh (TED hose) Bilateral lower extremities  Sequential compression devices, below knee Bilateral lower extremities  3. Chronic pain?/ Headaches: seems to have responded to percocet with decreased pain and better participation in therapy. Let's see  if this continues  4. CAD with NISCM:Off Xarelto due to falls. Will monitor daily weights for stability as well as signs of overload. Low salt diet.  Marland Kitchen Resumed Lipitor.    -need apical recording of HR---HR improved    -EP consult recs noted--consvt care   -off b-blocker per cards, avoid diuretics for now due to volume depletion 5.  Seizure prophylaxis: Continue Keppra bid.  6. Hyponatremia: likely central, may some volume depletion  -sodium up to 133 most recently  -continue to follow serially--recheck today      LOS (Days) 9 A FACE TO FACE EVALUATION WAS PERFORMED  Nyoka Cowden 12/22/2013 9:23 AM

## 2013-12-23 ENCOUNTER — Inpatient Hospital Stay (HOSPITAL_COMMUNITY): Payer: Medicare Other

## 2013-12-23 LAB — GLUCOSE, CAPILLARY
GLUCOSE-CAPILLARY: 171 mg/dL — AB (ref 70–99)
Glucose-Capillary: 117 mg/dL — ABNORMAL HIGH (ref 70–99)
Glucose-Capillary: 109 mg/dL — ABNORMAL HIGH (ref 70–99)

## 2013-12-23 NOTE — Progress Notes (Signed)
Patient ID: Kevin [name], male   DOB: 04-20-1940, 73 y.o.   MRN: [id number]  Patient ID: Kevin [name], male   DOB: October 22, 1940, 73 y.o.   MRN: [id number]   [place] PHYSICAL MEDICINE & REHABILITATION     PROGRESS NOTE   12/23/13.  Subjective/Complaints:  73 y/o admit for CIR with functional deficits secondary to L frontoparietal SDH past falls in setting of anticoagulation.   Alert; Some constipation yesterday requiring treatment. Otherwise no complaints.  Past Medical History  Diagnosis Date  . Arthritis   . Hypertension   . CHF (congestive heart failure)     a. NICM/acute CHF 2007 with EF 10-20%. b. Improved EF 50-55% by echo 12/2011.  Marland Kitchen Hyperlipidemia   . Coronary artery disease     a. NSTEMI 2007 with cath showing mild nonobstructive CAD by cath 2007 (40% mLAD, 40%mRCA) at time of dx of NICM.  Marland Kitchen Tortuous aorta     a. Noted by cath 2007, recommendation for OP [place].  Marland Kitchen BPH (benign prostatic hyperplasia)   . Wide-complex tachycardia     a. Noted 2007.  . Obesity   . Valvular heart disease     a. Mod MR by echo 06/2005 but no significant MR 2013.  Marland Kitchen DVT (deep venous thrombosis)   . Pulmonary embolism     Patient Vitals for the past 24 hrs:  BP Temp Temp src Pulse Resp SpO2  12/23/13 0621 116/64 mmHg 98.6 F (37 C) Oral 75 18 100 %  12/22/13 2133 106/74 mmHg 98.5 F (36.9 C) Oral 81 18 98 %  12/22/13 1425 112/79 mmHg 98.3 F (36.8 C) Oral 90 20 100 %     Intake/Output Summary (Last 24 hours) at 12/23/13 0815 Last data filed at 12/23/13 0039  Gross per 24 hour  Intake    240 ml  Output    900 ml  Net   -660 ml     Objective: Vital Signs: Blood pressure 116/64, pulse 75, temperature 98.6 F (37 C), temperature source Oral, resp. rate 18, height 5\' 7"  (1.702 m), weight 94.3 kg (207 lb 14.3 oz), SpO2 100.00%. No results found.  Recent Labs  12/21/13 0930  WBC 5.5  HGB 12.9*  HCT 39.5  PLT 156   No results found for this basename: NA, K, CL, CO,  GLUCOSE, BUN, CREATININE, CALCIUM,  in the last 72 hours CBG (last 3)   Recent Labs  12/22/13 1639 12/22/13 2128 12/23/13 0729  GLUCAP 120* 130* 117*    Wt Readings from Last 3 Encounters:  12/20/13 94.3 kg (207 lb 14.3 oz)  12/13/13 93 kg (205 lb 0.4 oz)  12/13/13 93 kg (205 lb 0.4 oz)    Physical Exam:  HENT: dentition fair Left curvilinear craniotomy site with staples out, clean Eyes:  Pupils reactive to light. anicteric  Neck: Neck supple. No thyromegaly present.  Cardiovascular: Regular rhythm. HR 75-80  Respiratory: Effort normal and breath sounds normal. No respiratory distress. No rales  GI: Soft. Bowel sounds are normal. He exhibits no distension.  Musculoskeletal:  S/p L AKA Trace edema Neurological:  Flat, but  alert. RUE and RLE 3- to 3/5.    Psych: flat, cooperative   Medical Problem List and Plan:   1. Functional deficits secondary to L frontoparietal SDH past falls in setting of anticoagulation.  2. DVT Prophylaxis/Anticoagulation: Mechanical: Antiembolism stockings, thigh (TED hose) Bilateral lower extremities  Sequential compression devices, below knee Bilateral lower extremities  3. Chronic pain?/ Headaches:  seems to have responded to percocet with decreased pain and better participation in therapy. Let's see if this continues  4. CAD with NISCM:Off Xarelto due to falls. Will monitor daily weights for stability as well as signs of overload. Low salt diet.  Marland Kitchen Resumed Lipitor.    -need apical recording of HR---HR improved    -EP consult recs noted--consvt care   -off b-blocker per cards, avoid diuretics for now due to volume depletion 5.  Seizure prophylaxis: Continue Keppra bid.  6. Hyponatremia: likely central, may some volume depletion  -sodium up to 133 most recently  -continue to follow serially--recheck today      LOS (Days) 10 A FACE TO FACE EVALUATION WAS PERFORMED  Nyoka Cowden 12/23/2013 8:15 AM

## 2013-12-23 NOTE — Progress Notes (Signed)
Physical Therapy Session Note  Patient Details  Name: Kevin [name] MRN: [id number] Date of Birth: 28-Oct-1940  Today's Date: 12/23/2013 PT Individual Time: 1010-1045 PT Individual Time Calculation (min): 35 min   Short Term Goals: Week 1:  PT Short Term Goal 1 (Week 1): Pt will roll R and L at mod A level PT Short Term Goal 2 (Week 1): Pt will perform static sitting balance at S level without UE support PT Short Term Goal 3 (Week 1): Pt will perform dynamic sitting balance with single UE support x 2 mins at min A level PT Short Term Goal 4 (Week 1): Pt will perform SB transfers at max A of single therapist  Skilled Therapeutic Interventions/Progress Updates:    Pt received supine in bed agreeable to participate in therapy. PT rolled L/R w/ Min-ModA to assist with cleanup and changing brief. Pt moved supine>sit w/ MinA. Sat EOB for 5 minutes for seated tolerance w/ SBA to MinA to maintain balance. Pt w/ squat pivot transfer w/ +2A and max VC's for sequencing and hand placement. Worked on standing tolerance and seated balance in STEDY lift, pt moved sit<>stand x3 w/ Mod-MaxA, sat on high perch in lift for 3 minutes. Pt left seated in TIS w/c w/ QRB on w/ all needs within reach.  Therapy Documentation Precautions:  Precautions Precautions: Fall Precaution Comments: Pt with Lt AKA; wears prosthesis, burr hole evacuation Required Braces or Orthoses: Other Brace/Splint Other Brace/Splint: prosthesis Restrictions Weight Bearing Restrictions: No Other Position/Activity Restrictions: Son reports pt. stopped using prosthesis and began using wheelchair as primary mod of mobility due to pain in shoulders while using RW General:   Vital Signs: Therapy Vitals Temp: 98.6 F (37 C) Temp Source: Oral Pulse Rate: 75 Resp: 18 BP: 116/64 mmHg Patient Position (if appropriate): Lying Oxygen Therapy SpO2: 100 % O2 Device: None (Room air) Pain: Unrated, reported HA pain but bearable and  better than yesterday. Pt pre-medicated prior to session. Mobility:   Locomotion :    Trunk/Postural Assessment :    Balance:   Exercises:   Other Treatments:    See FIM for current functional status  Therapy/Group: Individual Therapy  Rada Hay Rada Hay, PT, DPT 12/23/2013, 7:45 AM

## 2013-12-24 ENCOUNTER — Inpatient Hospital Stay (HOSPITAL_COMMUNITY): Payer: Medicare Other

## 2013-12-24 ENCOUNTER — Inpatient Hospital Stay (HOSPITAL_COMMUNITY): Payer: Medicare Other | Admitting: *Deleted

## 2013-12-24 ENCOUNTER — Encounter (HOSPITAL_COMMUNITY): Payer: Medicare Other

## 2013-12-24 LAB — GLUCOSE, CAPILLARY: Glucose-Capillary: 86 mg/dL (ref 70–99)

## 2013-12-24 NOTE — Consult Note (Signed)
INITIAL DIAGNOSTIC EVALUATION - [facility]   MEDICAL NECESSITY:  Aspen Deterding was seen on the [facility] for an initial diagnostic evaluation owing to the patient's diagnosis of traumatic subdural hemorrhage.    According to medical records, Mr. Colina was admitted to the rehab unit owing to "Functional deficits secondary To L frontoparietal SDH past fall in setting of anticoagulation." He has a reported history of "HTN, diastolic CHF, CAD--on Xarelto, L-AKA with SB transfers and increased falls recently. He was noted to have cognitive changes and was evaluated in ED on 12/04/13. CT head showed a large isodense subdural on the left frontal and left posterior parietal region with significant mass effect and 17 mm midline shift. He was taken to OR for burr hole for evacuation of chronic left SDH on 12/05/2013 per Dr. Cyndy Freeze. Post op with lethargy and substantial drainage from posterior drain sites and follow up CCT with moderate decrease in size of l-SDH and new foci of extra axial hemorrhage along right parafalcine region. As drainage resolved drains removed and follow up CCT stable with 1 cm evolving L-frontotemporal parenchymal contusion. Lethargy is resolving and patient not anticoagulation candidate due to falls. Has h/o DVT/PE with IVC filter placed 2013. Dr. Angelena Form consulted for input on ventricular bigeminy and recommended continuing BB as electrolytes stable and follow up on outpatient basis. Cardiac enzymes negative."   Mr. Allman was previously evaluated by Dr. Marlane Hatcher (neuropsychology). Information from that appointment is as follows:   Mr. Sides performances were impaired, almost without exception. He did demonstrate below average abilities for recognition memory and incidental memory for complex visual information. Also, while still impaired, his simple attention was relatively better than other cognitive  abilities. His scores were profoundly impaired on all other subtests today and his overall mental status was impaired. Of note, Mr. Anselmi was quite slow to process information and took an extensive amount of time to reply to prompts. Such slow processing speed may have adversely impacted his performances on certain subtests, but cannot likely account for all other deficits seen, as many were seen on untimed tasks. Regarding learning and memory, it is notable that he was able to immediately encode and recall, at most, 3 pieces of information. From an emotional standpoint, Mr. Langworthy endorsed items consistent with mild levels of anxiety and depression. Additionally, he expressed a desire to talk about current frustrations and low mood with a professional.   The cognitive deficits that Mr. Reine demonstrated are consistent with the location of his subdural hematoma and potential damage caused by midline shift. It is expected that he will experience at least some level of cognitive recovery over time, though it is unclear whether he will fully return to baseline functioning. A more thorough neuropsychological evaluation as an outpatient is recommended in 8-12 months to more completely assess his cognitive strengths and weaknesses and to provide him and his treatment team with recommendations for care management. Although mild mood disruption could certainly be exacerbating his cognitive difficulties, it is not thought that mood disruption is the sole driving force behind his cognitive changes. Still, given his endorsement of symptoms and desire to process his emotions, he will be seen in follow-up while on the unit with neuropsychology. An appointment with Dr. Norton Pastel, PsyD will be set up for this purpose next week.   During today's visit, Mr. Pavlov appeared to have difficulty expressing information. He also did not always provide consistent answers to queries. This is  consistent with other staff  encounters. He generally expressed issues with pain which has been a significant barrier to therapy. In fact, he declines PT right prior to my appointment with him. I consulted with his PT therapists and they substantiated that Mr. Sgro declines his therapy for reasons of "pain, earache, headache, and low energy." It appears that he has been inconsistently participating with several staff members and it seems that it is due to a combination of low motivation possibly stemming from depression as well as cognitive and physiologic sequelae of TBI. Regardless, the patient feels perpetually down about his subjective inability to participate and he feels that the staff believes he is not trying. I recommend that we initiate treatment for depression to see if that helps with energy and motivation. Obviously, his care providers will continue to try and best manage his pain.   No adjustment issues endorsed. Suicidal/homicidal ideation, plan or intent was denied. No manic or hypomanic episodes were reported. The patient denied ever experiencing any auditory/visual hallucinations. No major behavioral or personality changes were endorsed.   PROCEDURES ADMINISTERED: [1 unit [id number] on 12/24/13] Diagnostic clinical interview  Review of available records  Behavioral Evaluation: Mr. Amsden was appropriately dressed for season and situation, and he appeared tidy and well-groomed. Normal posture was noted. He was quiet and hard a hard time in conversational exchanges. His speech was mildly slurred and he appeared sedated to a degree. His affect was blunted. Attention and motivation varied.     SUMMARY & IMPRESSION: Overall, Mr. Elena had a hard time consistently engaging in this appointment. This appears to be a similar finding with other providers. I think the best course of action to treat depressive symptoms and hope that his energy and motivation improves. However, it is also very likely that his TBI is a  playing a role to some degree. At this time, consider initiating an antidepressant (e.g., fluoxetine might be a good choice). His physician could also consider combining it with a low dose psychostimulant (if not medically contraindicated).   RECOMMENDATIONS    Implementing an antidepressant may be beneficial (e.g., fluoxetine). If not medically contraindicated, a low dose psychostimulant might also be used in combination with the antidepressant to increase energy and motivation. This decision should be left to his physician.    Continued follow-up for support is warranted. He could be seen by Dr. Beverly Gust this Thursday or me next Monday.   DIAGNOSES:  Traumatic subdural hemorrhage Depression   Rutha Bouchard, Psy.D.  Clinical Neuropsychologist  Rehabilitation psychologist

## 2013-12-24 NOTE — Progress Notes (Addendum)
Occupational Therapy Note  Patient Details  Name: Kevin [name] MRN: [id number] Date of Birth: [birth year]  Today's Date: 12/24/2013 OT Missed Time: 28 Minutes Missed Time Reason: Pain;Patient unwilling/refused to participate without medical reason  Patient missing 60 min skilled OT secondary to refusal due to pain in ear. RN provided pain medication and therapist attempted multiple times. Patient continued to refuse despite education on importance of therapy to increase independence and prepare for discharge. Patient continuously reporting "I'll do it tomorrow."   Attempted to make-up missed minutes in PM, however, patient continued to refuse due to pain and fatigue. Will follow-up as able.  Kristen Fromm N 12/24/2013, 8:10 AM

## 2013-12-24 NOTE — Progress Notes (Signed)
Speech Language Pathology Daily Session Note  Patient Details  Name: Kevin [name] MRN: [id number] Date of Birth: 1940-06-05  Today's Date: 12/24/2013 SLP Individual Time: 1100-1130 SLP Individual Time Calculation (min): 30 min  Short Term Goals: Week 2: SLP Short Term Goal 1 (Week 2): Pt will initiate verbal and functional tasks with Min A multimodal cues.  SLP Short Term Goal 2 (Week 2): Patient will sustain attention to functional tasks for 10 minutes with Mod A multimodal cues.  SLP Short Term Goal 3 (Week 2): Pt will utilize external aids to recall new, daily information with Min A multimodal cues.  SLP Short Term Goal 4 (Week 2): Patient will label 2 physical and 2 cognitive deficits with Min A question cues SLP Short Term Goal 5 (Week 2): Patient will consume current diet with minimal overt s/s of aspiration with supervision verbal cues for utilization of swallowing compensatory strategies. SLP Short Term Goal 6 (Week 2): Patient will demonstrate efficient mastication with trials of regular textures with supervision verbal cues.   Skilled Therapeutic Interventions: Skilled treatment focused on cognitive goals. SLP facilitated session with structured cognitive task. Pt completed money sorting task with Min cues to sort coins into four categories. Pt then counted coins separately with Min cues. SLP provided Mod-Max cues for working memory and basic problem solving to add different types of coins together and make simple money calculations. Pt reported that it was due to him not "paying attention." Continue plan of care.   FIM:  Comprehension Comprehension Mode: Auditory Comprehension: 4-Understands basic 75 - 89% of the time/requires cueing 10 - 24% of the time Expression Expression Mode: Verbal Expression: 5-Expresses basic needs/ideas: With extra time/assistive device Social Interaction Social Interaction: 2-Interacts appropriately 25 - 49% of time - Needs frequent  redirection. Problem Solving Problem Solving: 3-Solves basic 50 - 74% of the time/requires cueing 25 - 49% of the time Memory Memory: 3-Recognizes or recalls 50 - 74% of the time/requires cueing 25 - 49% of the time  Pain Pain Assessment Pain Assessment: 0-10 Pain Score: 7  Pain Location: Head Pain Orientation: Left Pain Descriptors / Indicators: Aching Pain Intervention(s): Medication (See eMAR)  Therapy/Group: Individual Therapy   [name], M.A. CCC-SLP [phone]  [name] 12/24/2013, 4:48 PM

## 2013-12-24 NOTE — Progress Notes (Signed)
[facility]ICINE & REHABILITATION     PROGRESS NOTE    Subjective/Complaints: Poor to inconsistent participation in therapies. Headaches and other somatic complaints. fatigue  Objective: Vital Signs: Blood pressure 105/82, pulse 76, temperature 99.1 F (37.3 C), temperature source Oral, resp. rate 18, height 5\' 7"  (1.702 m), weight 94.3 kg (207 lb 14.3 oz), SpO2 98.00%. No results found.  Recent Labs  12/21/13 0930  WBC 5.5  HGB 12.9*  HCT 39.5  PLT 156   No results found for this basename: NA, K, CL, CO, GLUCOSE, BUN, CREATININE, CALCIUM,  in the last 72 hours CBG (last 3)   Recent Labs  12/23/13 1609 12/23/13 2002 12/24/13 0704  GLUCAP 109* 171* 86    Wt Readings from Last 3 Encounters:  12/20/13 94.3 kg (207 lb 14.3 oz)  12/13/13 93 kg (205 lb 0.4 oz)  12/13/13 93 kg (205 lb 0.4 oz)    Physical Exam:  HENT: dentition fair Left curvilinear craniotomy site intact    Eyes:  Pupils reactive to light. anicteric  Neck: Neck supple. No thyromegaly present.  Cardiovascular: Regular rhythm. HR 75-80  Respiratory: Effort normal and breath sounds normal. No respiratory distress. No rales  GI: Soft. Bowel sounds are normal. He exhibits no distension.  Musculoskeletal:  Edematous left aka stump. RLE with evidence of multiple old healed abrasions on shin and knee. Neurological:  Flat, but  alert. Makes eye contact with cueing. Can follow simple commands.  Pupils reactive. RUE and RLE 3- to 3/5. 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 Physiatrist is providing close team supervision and 24 hour management of active medical problems listed below. Physiatrist and rehab team  continue to assess barriers to discharge/monitor patient progress toward functional and medical goals.  ?SNF placement   FIM: FIM - Bathing Bathing Steps Patient Completed: Chest;Abdomen;Left Arm;Right Arm;Front perineal area;Right upper leg;Left upper leg Bathing: 1: Two helpers (+2 sit<>stand)  FIM - Upper Body Dressing/Undressing Upper body dressing/undressing steps patient completed: Thread/unthread right sleeve of pullover shirt/dresss;Thread/unthread left sleeve of pullover shirt/dress;Put head through opening of pull over shirt/dress;Pull shirt over trunk Upper body dressing/undressing: 5: Set-up assist to: Obtain clothing/put away FIM - Lower Body Dressing/Undressing Lower body dressing/undressing steps patient completed: Thread/unthread left underwear leg;Thread/unthread right underwear leg Lower body dressing/undressing: 3: Mod-Patient completed 50-74% of tasks (completed supine in bed)  FIM - Toileting Toileting: 1: Total-Patient completed zero steps, helper did all 3  FIM - Radio producer Devices: Research officer, trade union Transfers: 2-To toilet/BSC: Max A (lift and lower assist);2-From toilet/BSC: Max A (lift and lower assist)  FIM - Control and instrumentation engineer Devices: Sliding board;Bed rails;Arm rests Bed/Chair Transfer: 4: Supine > Sit: Min A (steadying Pt. > 75%/lift 1 leg);1: Two helpers  FIM - Locomotion: Wheelchair Distance: 125 Locomotion: Wheelchair: 0: Activity did not occur FIM - Locomotion: Ambulation Ambulation/Gait Assistance: Not tested (comment) Locomotion: Ambulation: 0: Activity did not occur  Comprehension Comprehension Mode: Auditory Comprehension: 5-Follows basic conversation/direction: With extra time/assistive device  Expression Expression Mode: Verbal Expression: 5-Expresses basic needs/ideas: With extra time/assistive device  Social Interaction Social Interaction: 4-Interacts  appropriately 75 - 89% of the time - Needs redirection for appropriate language or to initiate interaction.  Problem Solving Problem  Solving: 3-Solves basic 50 - 74% of the time/requires cueing 25 - 49% of the time  Memory Memory: 4-Recognizes or recalls 75 - 89% of the time/requires cueing 10 - 24% of the time  Medical Problem List and Plan:  1. Functional deficits secondary to L frontoparietal SDH past falls in setting of anticoagulation.  2. DVT Prophylaxis/Anticoagulation: Mechanical: Antiembolism stockings, thigh (TED hose) Bilateral lower extremities  Sequential compression devices, below knee Bilateral lower extremities  3. Chronic pain?/ Headaches: seems to have responded to percocet with decreased pain and better participation in therapy. Let's see if this continues 4. Mood: Monitor for any evidence of anxiety. ? valium for anxiety at home. LCSW to follow for evaluation and supportive  5. Neuropsych: This patient is not capable of making decisions on his own behalf.  6. Skin/Wound Care: Routine pressure relief measures. Monitor for signs of breakdown.  7. Fluids/Electrolytes/Nutrition: Monitor I/O for adequate intake. Offer nutritional supplements if intake poor.  8. CAD with NISCM:Off Xarelto due to falls.   daily weights   Low salt diet.  Marland Kitchen Resumed Lipitor.    -need apical recording of HR---HR improved    -EP consult recs noted--consvt care   -off b-blocker per cards, avoid diuretics for now due to volume depletion 9. Seizure prophylaxis: Continue Keppra bid.  10. Hyponatremia: likely central, may some volume depletion  -sodium up to 133 most recently  -re-check this week    1    LOS (Days) 11 A FACE TO FACE EVALUATION WAS PERFORMED  SWARTZ,ZACHARY T 12/24/2013 7:58 AM

## 2013-12-24 NOTE — Progress Notes (Signed)
Physical Therapy Note  Patient Details  Name: Kevin [name] MRN: [id number] Date of Birth: 09-11-1940 Today's Date: 12/24/2013    Pt refused to participate in therapy this PM due to headache, earache pain, fatigue, and stomach cramps. Pt educated on benefit of mobility for recovery, continued to refuse despite max encouragement. "I'm not going to do anything today." Pt missed 45 minutes scheduled physical therapy.  Rada Hay 12/24/2013, 1:15 PM

## 2013-12-24 NOTE — Progress Notes (Signed)
Occupational Therapy Weekly Progress Note  Patient Details  Name: Kevin [name] MRN: [id number] Date of Birth: 1940-10-16  Beginning of progress report period: December 15, 2013 End of progress report period: December 24, 2013   Patient has met 2 of 5 short term goals.  Patient has made some functional gains in therapy this reporting period. Patient has improved sitting balance sitting EOB with min-SBA for up to 5 minutes. Patient competes sliding board transfer bed<>w/c with mod assist and max assist sliding board transfer to drop arm BSC. Pt requires total assist for toilet hygiene and clothing management with patient assisting ~20% of task. Patient demonstrates improved sustained attention to self-care tasks up to 10 min with mod cues. Patient's greatest barriers at this time are pain, decreased activity tolerance, and awareness. Patient is currently demonstrating behaviors consistent with [place] Level VI.  Patient continues to demonstrate the following deficits: decreased postural control in sitting and standing, decreased activity tolerance, decreased awareness, decreased strength, decreased insight into deficits, decreased coordination, decreased sitting balance, decreased standing balance, impaired cognitoin  and therefore will continue to benefit from skilled OT intervention to enhance overall performance with BADLs, balance, awareness, activity tolerance, and cognition.  Patient progressing toward long term goals. at slow rate.  Plan of care revisions: Downgraded toilet task to min assist for sitting balance during lateral leans and mod assist for toilet transfer to drop arm BSC.   OT Short Term Goals Week 1:  OT Short Term Goal 1 (Week 1): Pt.  will perform rolling with min assist during bathing OT Short Term Goal 1 - Progress (Week 1): Met OT Short Term Goal 2 (Week 1): Pt. will sit EOB with midline control for 10 minutes OT Short Term Goal 2 - Progress (Week 1): Progressing toward  goal OT Short Term Goal 3 (Week 1): Pt. will bath UB in sitting with minimal assist for balance and SBA for bathing OT Short Term Goal 3 - Progress (Week 1): Met OT Short Term Goal 4 (Week 1): Pt. will perform lateral lean with min assist for peri care EOB OT Short Term Goal 4 - Progress (Week 1): Not met OT Short Term Goal 5 (Week 1): Pt. will transfer to toilet with mod assist.  OT Short Term Goal 5 - Progress (Week 1): Not met Week 2:  OT Short Term Goal 1 (Week 2): Patient will complete toilet transfer with mod assist and min cues for sequencing OT Short Term Goal 2 (Week 2): Patient will complete bathing from sitting and supine at supervision level OT Short Term Goal 3 (Week 2): Patient will sustain attention to self-care task for 10 min with min cues OT Short Term Goal 4 (Week 2): Patient will manage clothing around waist during LB dressing with min assist   Skilled Therapeutic Interventions/Progress Updates:      Therapy Documentation Precautions:  Precautions Precautions: Fall Precaution Comments: Pt with Lt AKA; wears prosthesis, burr hole evacuation Required Braces or Orthoses: Other Brace/Splint Other Brace/Splint: prosthesis Restrictions Weight Bearing Restrictions: No Other Position/Activity Restrictions: Son reports pt. stopped using prosthesis and began using wheelchair as primary mod of mobility due to pain in shoulders while using RW General:   Vital Signs: Therapy Vitals Temp: 99.1 F (37.3 C) Temp Source: Oral Pulse Rate: 76 Resp: 18 BP: 105/82 mmHg Patient Position (if appropriate): Lying Oxygen Therapy SpO2: 98 % O2 Device: None (Room air) Pain: Pain Assessment Pain Assessment: 0-10 Pain Score: 6  Pain Location: Head   See  FIM for current functional status  Therapy/Group: Individual Therapy  Duayne Cal 12/24/2013, 7:44 AM

## 2013-12-24 NOTE — Progress Notes (Signed)
Physical Therapy Weekly Progress Note  Patient Details  Name: Kevin [name] MRN: [id number] Date of Birth: [birth year]  Beginning of progress report period: December 14, 2013 End of progress report period: December 24, 2013  Today's Date: 12/24/2013   Patient has met 4 of 4 short term goals.  Patient has made good progress on rehab during first reporting period, however, participation is limited secondary to refusals due to high pain levels and poor activity tolerance. Patient currently demonstrating behaviors consistent with [place] Level VI.  Patient continues to demonstrate the following deficits: poor activity tolerance, high pain levels, decreased postural control, ability to compensate for deficits, functional use of L LE, sustained atttention, awareness, coordination and knowledge of precautions and therefore will continue to benefit from skilled PT intervention to enhance overall performance with activity tolerance, balance, postural control, ability to compensate for deficits, functional use of  left lower extremity, attention, awareness, coordination and knowledge of precautions.  Patient progressing toward long term goals..  Continue plan of care.  PT Short Term Goals Week 1:  PT Short Term Goal 1 (Week 1): Pt will roll R and L at mod A level PT Short Term Goal 1 - Progress (Week 1): Met PT Short Term Goal 2 (Week 1): Pt will perform static sitting balance at S level without UE support PT Short Term Goal 2 - Progress (Week 1): Met PT Short Term Goal 3 (Week 1): Pt will perform dynamic sitting balance with single UE support x 2 mins at min A level PT Short Term Goal 3 - Progress (Week 1): Met PT Short Term Goal 4 (Week 1): Pt will perform SB transfers at max A of single therapist PT Short Term Goal 4 - Progress (Week 1): Met Week 2:  PT Short Term Goal 1 (Week 2): Patient will perform bed mobility on flat bed with minA. PT Short Term Goal 2 (Week 2): Patient will perform  slideboard transfers consistently at [facility] level. PT Short Term Goal 3 (Week 2): Patient will perform wheelchair mobility with supervision x150'.  Skilled Therapeutic Interventions/Progress Updates:   Patient missed 60 minutes of skilled physical therapy this AM secondary to c/o headache and ear ache. Attempted to encourage patient in OOB activity and bed level exercises, but patient continues to refuse. Followed up with RN about patient status.  Therapy Documentation Precautions:  Precautions Precautions: Fall Precaution Comments: Pt with Lt AKA; wears prosthesis, burr hole evacuation Required Braces or Orthoses: Other Brace/Splint Other Brace/Splint: prosthesis Restrictions Weight Bearing Restrictions: No Other Position/Activity Restrictions: Son reports pt. stopped using prosthesis and began using wheelchair as primary mod of mobility due to pain in shoulders while using RW General: PT Amount of Missed Time (min): 60 Minutes PT Missed Treatment Reason: Patient unwilling to participate;Pain Pain: Pain Assessment Pain Assessment: 0-10 Pain Score: 6  Pain Location: Head Locomotion : Ambulation Ambulation/Gait Assistance: Not tested (comment)   See FIM for current functional status  Therapy/Group: Individual Therapy  Lillia Abed. Mccrae Speciale, PT, DPT 12/24/2013, 10:01 AM

## 2013-12-24 NOTE — Progress Notes (Signed)
Physical Therapy Note  Patient Details  Name: Kevin [name] MRN: [id number] Date of Birth: June 08, 1940 Today's Date: 12/24/2013  Patient missed 30 minutes of skilled physical therapy this PM secondary to refusal due to headache/earache. Attempted to motivate/encourage patient in various ways, but continues to refuse. Educated patient that if participation does not improve, inpatient rehab may not be appropriate venue for him. Patient verbalized understanding.   Camas Tinie Mcgloin, PT, DPT 12/24/2013, 3:20 PM

## 2013-12-25 ENCOUNTER — Inpatient Hospital Stay (HOSPITAL_COMMUNITY): Payer: Medicare Other | Admitting: *Deleted

## 2013-12-25 ENCOUNTER — Encounter (HOSPITAL_COMMUNITY): Payer: Medicare Other

## 2013-12-25 ENCOUNTER — Inpatient Hospital Stay (HOSPITAL_COMMUNITY): Payer: Medicare Other

## 2013-12-25 DIAGNOSIS — N179 Acute kidney failure, unspecified: Secondary | ICD-10-CM

## 2013-12-25 DIAGNOSIS — N189 Chronic kidney disease, unspecified: Secondary | ICD-10-CM

## 2013-12-25 LAB — BASIC METABOLIC PANEL
ANION GAP: 13 (ref 5–15)
BUN: 24 mg/dL — ABNORMAL HIGH (ref 6–23)
CALCIUM: 8.8 mg/dL (ref 8.4–10.5)
CO2: 23 mEq/L (ref 19–32)
CREATININE: 1.61 mg/dL — AB (ref 0.50–1.35)
Chloride: 95 mEq/L — ABNORMAL LOW (ref 96–112)
GFR, EST AFRICAN AMERICAN: 47 mL/min — AB (ref >90)
GFR, EST NON AFRICAN AMERICAN: 41 mL/min — AB (ref >90)
Glucose, Bld: 93 mg/dL (ref 70–99)
Potassium: 4.8 mEq/L (ref 3.7–5.3)
SODIUM: 131 meq/L — AB (ref 137–147)

## 2013-12-25 LAB — CBC
HCT: 39.2 % (ref 39.0–52.0)
Hemoglobin: 12.7 g/dL — ABNORMAL LOW (ref 13.0–17.0)
MCH: 26.3 pg (ref 26.0–34.0)
MCHC: 32.4 g/dL (ref 30.0–36.0)
MCV: 81.2 fL (ref 78.0–100.0)
PLATELETS: 180 10*3/uL (ref 150–400)
RBC: 4.83 MIL/uL (ref 4.22–5.81)
RDW: 16.3 % — ABNORMAL HIGH (ref 11.5–15.5)
WBC: 6.2 10*3/uL (ref 4.0–10.5)

## 2013-12-25 MED ORDER — HYDROCERIN EX CREA
TOPICAL_CREAM | Freq: Two times a day (BID) | CUTANEOUS | Status: DC
  Administered 2013-12-25 – 2013-12-31 (×12): via TOPICAL
  Administered 2013-12-31: 1 via TOPICAL
  Administered 2014-01-01 – 2014-01-02 (×3): via TOPICAL
  Filled 2013-12-25: qty 113

## 2013-12-25 NOTE — Progress Notes (Signed)
Physical Therapy Session Note  Patient Details  Name: KALAB CAMPS MRN: [id number] Date of Birth: [birth year]  Today's Date: 12/25/2013 PT Individual Time: 0800-0835 PT Individual Time Calculation (min): 35 min   Short Term Goals: Week 2:  PT Short Term Goal 1 (Week 2): Patient will perform bed mobility on flat bed with minA. PT Short Term Goal 2 (Week 2): Patient will perform slideboard transfers consistently at [facility] level. PT Short Term Goal 3 (Week 2): Patient will perform wheelchair mobility with supervision x150'.  Skilled Therapeutic Interventions/Progress Updates:    Patient received sidelying in bed, asleep. Patient able to be awakened. Patient refusing therapy and consistently rolling away from therapist and closing eyes. Therapist modifying environment to facilitate increased alertness and participation (i.e. Turning on lights, opening blinds, raising HOB, etc.). Extensive discussion/education with patient about importance of therapy in order to gain strength and improved mobility to discharge home. Patient reluctant to participate, but eventually agreeable to transfer OOB to sit in wheelchair to eat breakfast.  Patient supine>sit with HOB elevated and use of bed rails with minA. Set up provided for slideboard transfer. Patient requiring max-totalA to initiate movement and appearing much more deconditioned when attempting mobility. This therapist noted that patient leaning too far anteriorly and unable to assist/correct to reposition in either wheelchair or bed. Patient slowly lowered to floor by therapist without injury. Vitals taken and stable. Mechanical lift used to lift patient from floor to wheelchair. Patient left sitting in wheelchair with seatbelt donned to eat breakfast. Patient without c/o pain, joking about incident.  Therapy Documentation Precautions:  Precautions Precautions: Fall Precaution Comments: Pt with Lt AKA; wears prosthesis, burr hole evacuation Required  Braces or Orthoses: Other Brace/Splint Other Brace/Splint: prosthesis Restrictions Weight Bearing Restrictions: No Other Position/Activity Restrictions: Son reports pt. stopped using prosthesis and began using wheelchair as primary mod of mobility due to pain in shoulders while using RW General: PT Amount of Missed Time (min): 25 Minutes PT Missed Treatment Reason: Patient unwilling to participate;Other (Comment) (requests to eat breakfast) Pain: Pain Assessment Pain Assessment: No/denies pain Pain Score: 0-No pain Locomotion : Ambulation Ambulation/Gait Assistance: Not tested (comment)   See FIM for current functional status  Therapy/Group: Individual Therapy  Lillia Abed. Elva Mauro, PT, DPT 12/25/2013, 8:57 AM

## 2013-12-25 NOTE — Progress Notes (Signed)
Social Work Lennart Pall, LCSW Social Worker Signed  Patient Care Conference Service date: 12/25/2013 6:05 PM  Inpatient RehabilitationTeam Conference and Plan of Care Update Date: 12/25/2013   Time: 3:10 PM     Patient Name: Kevin [name]       Medical Record Number: [id number]   Date of Birth: 02-23-1941 Sex: Male         Room/Bed: 4W11C/4W11C-01 Payor Info: Payor: MEDICARE / Plan: MEDICARE PART A AND B / Product Type: *No Product type* /   Admitting Diagnosis: traumatic sdh   Admit Date/Time:  12/13/2013  2:59 PM Admission Comments: No comment available   Primary Diagnosis:  <principal problem not specified> Principal Problem: <principal problem not specified>    Patient Active Problem List     Diagnosis  Date Noted   .  Systolic dysfunction  77/82/4235   .  Syncope  12/14/2013   .  Traumatic subdural hemorrhage  12/13/2013   .  Ventricular bigeminy  12/12/2013   .  Subdural hematoma  12/05/2013   .  Chronic subdural hematoma  12/05/2013   .  Subdural hematoma without coma  12/04/2013   .  Amputee, above knee (left)  01/11/2012   .  Renal failure  01/11/2012   .  Leucocytosis  01/11/2012   .  DVT, femoral, acute  01/11/2012   .  DVT of axillary vein, acute  01/04/2012   .  Acute pulmonary embolism  01/04/2012   .  Renal mass, right  01/03/2012   .  Severe sepsis(995.92)  01/02/2012   .  Septic shock(785.52)  01/02/2012   .  Hypotension  01/02/2012   .  Right ventricular failure  01/02/2012   .  Ischemic leg  12/31/2011   .  HTN (hypertension)  12/31/2011   .  CAD (coronary artery disease)  12/31/2011   .  CHF (congestive heart failure)  12/31/2011   .  Tobacco abuse  12/31/2011   .  Hyperlipidemia  12/31/2011     Expected Discharge Date: Expected Discharge Date: 01/02/14 (vs SNF)  Team Members Present: Physician leading conference: Dr. Alger Simons Social Worker Present: Lennart Pall, LCSW Nurse Present: Elliot Cousin, RN PT Present: Raylene Everts, PT;Bridgett  Ripa, Cottie Banda, PT OT Present: Willeen Cass, Roland Earl, OT;Roanna Epley, COTA SLP Present: Other (comment) Venita Sheffield., ST) PPS Coordinator present : Daiva Nakayama, RN, CRRN        Current Status/Progress  Goal  Weekly Team Focus   Medical     poor participation and activity tolerance. headaches  improve interactions, see prior  pain control, motivational   Bowel/Bladder     Pt continent of bowel and bladder  remain continent manage b/b minimal assist  encoruage use of BSC instead bedpan    Swallow/Nutrition/ Hydration     Dys 3 textures and thin liquids, Min cues  Supervision  utilization of small bites/sips, trials of regular textures   ADL's     min-mod assist bathing and dressing from bed level (+2 assist for sit<>stand); max assist SBT to/from toilet, total assist toileting  supervision overall with mod assist toilet transfer and min assist toileting  sitting balance, activity tolerance, functional transfers, attention, awareness   Mobility            Communication            Safety/Cognition/ Behavioral Observations    Mod-Max A  supervision for orientation, min A for problem solving, memory, attention and awareness  sustained attention, emergent awareness, problem solving, memory   Pain     pt complains of headache at times. 1 percocet prn q6 hrs   pain managed at 4 or less  assess pain and medicate as needed.   Skin     abrasion to right shin-tegaderm in place. Incision approximated to scalp.  Skin remains free of breakdown with minimal assist  assess skin qshift. moniotr for s/s of infection    Rehab Goals Patient on target to meet rehab goals: No Rehab Goals Revised: very poor participation and most goals likely to be downgraded *See Care Plan and progress notes for long and short-term goals.    Barriers to Discharge:  chronic, dependent behaviors      Possible Resolutions to Barriers:    see prior, ?SNF      Discharge Planning/Teaching Needs:     Original plan was for pt to d/c home with son providing 24/7 assist.  Have spoken with son about pt's poor participation and he is in agreement that if this does not improve then plan will need to change to SNF.      Team Discussion:    No progress this week and very poor participation of pt.  At times actually turns eyes and head away.  Currently requiring total assist +2 at times.  Son aware of pt's lack of progress with plans to speak with pt this evening.  SW to follow up with them both tomorrow.  SW notes both aware of the option of SNF if no changes to pt's level of participation.   Revisions to Treatment Plan:    Plan to change schedule to qd    Continued Need for Acute Rehabilitation Level of Care: The patient requires daily medical management by a physician with specialized training in physical medicine and rehabilitation for the following conditions: Daily direction of a multidisciplinary physical rehabilitation program to ensure safe treatment while eliciting the highest outcome that is of practical value to the patient.: Yes Daily medical management of patient stability for increased activity during participation in an intensive rehabilitation regime.: Yes Daily analysis of laboratory values and/or radiology reports with any subsequent need for medication adjustment of medical intervention for : Other;Post surgical problems;Neurological problems  Kevin [name] 12/25/2013, 6:05 PM         Lennart Pall, LCSW Social Worker Signed  Patient Care Conference Service date: 12/18/2013 3:38 PM  Inpatient RehabilitationTeam Conference and Plan of Care Update Date: 12/18/2013   Time: 3:15 PM     Patient Name: Kevin [name]       Medical Record Number: [id number]   Date of Birth: 10-09-1940 Sex: Male         Room/Bed: 4W11C/4W11C-01 Payor Info: Payor: MEDICARE / Plan: MEDICARE PART A AND B / Product Type: *No Product type* /   Admitting Diagnosis: traumatic sdh   Admit Date/Time:   12/13/2013  2:59 PM Admission Comments: No comment available   Primary Diagnosis:  <principal problem not specified> Principal Problem: <principal problem not specified>    Patient Active Problem List     Diagnosis  Date Noted   .  Systolic dysfunction  14/43/1540   .  Syncope  12/14/2013   .  Traumatic subdural hemorrhage  12/13/2013   .  Ventricular bigeminy  12/12/2013   .  Subdural hematoma  12/05/2013   .  Chronic subdural hematoma  12/05/2013   .  Subdural hematoma without coma  12/04/2013   .  Amputee, above  knee (left)  01/11/2012   .  Renal failure  01/11/2012   .  Leucocytosis  01/11/2012   .  DVT, femoral, acute  01/11/2012   .  DVT of axillary vein, acute  01/04/2012   .  Acute pulmonary embolism  01/04/2012   .  Renal mass, right  01/03/2012   .  Severe sepsis(995.92)  01/02/2012   .  Septic shock(785.52)  01/02/2012   .  Hypotension  01/02/2012   .  Right ventricular failure  01/02/2012   .  Ischemic leg  12/31/2011   .  HTN (hypertension)  12/31/2011   .  CAD (coronary artery disease)  12/31/2011   .  CHF (congestive heart failure)  12/31/2011   .  Tobacco abuse  12/31/2011   .  Hyperlipidemia  12/31/2011     Expected Discharge Date: Expected Discharge Date: 01/02/14  Team Members Present: Physician leading conference: Dr. Alger Simons Social Worker Present: Lennart Pall, LCSW Nurse Present: Rozetta Nunnery, RN PT Present: Melene Plan, Cottie Banda, PT OT Present: Roanna Epley, COTA;Jennifer Jolene Schimke, OT SLP Present: Weston Anna, SLP PPS Coordinator present : Daiva Nakayama, RN, CRRN        Current Status/Progress  Goal  Weekly Team Focus   Medical     left AKA, left SDH, cardiomyopathy  improve activity tolerance  pain control, bp/cv mgt   Bowel/Bladder     continent bowel/bladder-uses urinal  remain continent  offer urinal , toilet assistance as needed   Swallow/Nutrition/ Hydration     Dys. 3 textures with thin liquids, Min A    Supervision  utilization of small bites and sips   ADL's     min A UB dressing, total assist LB dressing, mod assist bathing, S sitting balance with UE supported, min-max sitting balance w/o UE supported  supervision overall and min assist tub and toilet transfers  sitting balance, functional transfers, activity tolerance, attention, awareness, strengthening, sit<>stand   Mobility     modA overall for bed mobility, supervision sitting balance; has not performed transfer yet due to activity tolerance/vitals  supervision overall  education, safety, activity tolerance, functional transfers, wheelchair mobility, balance, strengthening   Communication            Safety/Cognition/ Behavioral Observations    Mod-Max A   supervision for orientation, min A for problem solving, memory, attention and awareness   sustained attention, emergent awareness, problem solving, memory   Pain     7-8 out of 10 headache  less than 3 out of 10  offer pain medication as needed- percocet added   Skin     Skin CDI  Skin remains free of breakdown  continue to assess daily    Rehab Goals Patient on target to meet rehab goals: Yes *See Care Plan and progress notes for long and short-term goals.    Barriers to Discharge:  fatigue, pain      Possible Resolutions to Barriers:    pain mgt, family ed, supervison/min assist goals at w/c level      Discharge Planning/Teaching Needs:    home with son to provide 24/7 assistance      Team Discussion:    Better today overall which could be due to increase in pain meds.  Trial of sliding board tfs and it went well.  Supervision to min assist w/c level goals.  Not sure how he was entering home PTA as there is no ramp present and pt was primarily w/c level (  did not use prosthesis).     Revisions to Treatment Plan:    None    Continued Need for Acute Rehabilitation Level of Care: The patient requires daily medical management by a physician with specialized training in  physical medicine and rehabilitation for the following conditions: Daily direction of a multidisciplinary physical rehabilitation program to ensure safe treatment while eliciting the highest outcome that is of practical value to the patient.: Yes Daily medical management of patient stability for increased activity during participation in an intensive rehabilitation regime.: Yes Daily analysis of laboratory values and/or radiology reports with any subsequent need for medication adjustment of medical intervention for : Post surgical problems;Neurological problems  Ryenn Howeth 12/18/2013, 3:38 PM          Patient ID: ARMANY MANO, male   DOB: [birth year], 73 y.o.   MRN: [id number]

## 2013-12-25 NOTE — Progress Notes (Signed)
Speech Language Pathology Daily Session Note  Patient Details  Name: Kevin [name] MRN: [id number] Date of Birth: December 26, 1940  Today's Date: 12/25/2013 SLP Individual Time: 6384-5364 SLP Individual Time Calculation (min): 30 min  Short Term Goals: Week 2: SLP Short Term Goal 1 (Week 2): Pt will initiate verbal and functional tasks with Min A multimodal cues.  SLP Short Term Goal 2 (Week 2): Patient will sustain attention to functional tasks for 10 minutes with Mod A multimodal cues.  SLP Short Term Goal 3 (Week 2): Pt will utilize external aids to recall new, daily information with Min A multimodal cues.  SLP Short Term Goal 4 (Week 2): Patient will label 2 physical and 2 cognitive deficits with Min A question cues SLP Short Term Goal 5 (Week 2): Patient will consume current diet with minimal overt s/s of aspiration with supervision verbal cues for utilization of swallowing compensatory strategies. SLP Short Term Goal 6 (Week 2): Patient will demonstrate efficient mastication with trials of regular textures with supervision verbal cues.   Skilled Therapeutic Interventions: Skilled treatment focused on cognitive and swallowing goals. SLP facilitated session with skilled observation of lunch meal consisting of DYs 3 textures and thin liquids via straw. Pt required Min verbal cues for utilization of small bites/sips and a slow rate of intake, although with no pocketing noted. Pt would benefit from trials of regular textures with SLP. Pt required Min cues for initiation and sustained attention throughout meal. Continue plan of care.   FIM:  Comprehension Comprehension Mode: Auditory Comprehension: 4-Understands basic 75 - 89% of the time/requires cueing 10 - 24% of the time Expression Expression Mode: Verbal Expression: 5-Expresses basic needs/ideas: With extra time/assistive device Social Interaction Social Interaction: 4-Interacts appropriately 75 - 89% of the time - Needs redirection  for appropriate language or to initiate interaction. Problem Solving Problem Solving: 4-Solves basic 75 - 89% of the time/requires cueing 10 - 24% of the time Memory Memory: 3-Recognizes or recalls 50 - 74% of the time/requires cueing 25 - 49% of the time FIM - Eating Eating Activity: 5: Needs verbal cues/supervision  Pain Pain Assessment Pain Assessment: No/denies pain  Therapy/Group: Individual Therapy   [name], M.A. CCC-SLP [phone]  [name] 12/25/2013, 2:16 PM

## 2013-12-25 NOTE — Progress Notes (Signed)
Occupational Therapy Session Note  Patient Details  Name: Kevin [name] MRN: [id number] Date of Birth: 1940/05/17  Today's Date: 12/25/2013 OT Individual Time: 1345-1430 OT Individual Time Calculation (min): 45 min    Short Term Goals: Week 2:  OT Short Term Goal 1 (Week 2): Patient will complete toilet transfer with mod assist and min cues for sequencing OT Short Term Goal 2 (Week 2): Patient will complete bathing from sitting and supine at supervision level OT Short Term Goal 3 (Week 2): Patient will sustain attention to self-care task for 10 min with min cues OT Short Term Goal 4 (Week 2): Patient will manage clothing around waist during LB dressing with min assist   Skilled Therapeutic Interventions/Progress Updates:    Pt seen for 1:1 OT session with focus on functional transfers, standing tolerance, and activity tolerance. Pt received sitting on BSC. Completed lateral leans initially for hygiene with total assist however required use of steady for sit<>stand for thoroughness and clothing management. Pt stood with mod assist +2 in steady (2nd person for safety), tolerating standing up to 7 seconds. Pt demonstrating flexed posture in standing. Completed SBT BSC>w/c and w/c>bed with total assist +2 and max cues for anterior weight shift and hand placement. Pt immediately transferring to supine with max assist for safety. Pt fatigued and declining further participation. Pt left supine in bed with all needs in reach.   Therapy Documentation Precautions:  Precautions Precautions: Fall Precaution Comments: Pt with Lt AKA; wears prosthesis, burr hole evacuation Required Braces or Orthoses: Other Brace/Splint Other Brace/Splint: prosthesis Restrictions Weight Bearing Restrictions: No Other Position/Activity Restrictions: Son reports pt. stopped using prosthesis and began using wheelchair as primary mod of mobility due to pain in shoulders while using RW General: General OT Amount of  Missed Time: 15 Minutes Vital Signs: Therapy Vitals Pulse Rate: 71 BP: 134/80 mmHg Pain: Pt reporting 6/10 pain. RN notified.   See FIM for current functional status  Therapy/Group: Individual Therapy  Duayne Cal 12/25/2013, 2:39 PM

## 2013-12-25 NOTE — Treatment Plan (Signed)
After discussion with treatment team, patient to be QD therapy secondary to high refusal rate, poor participation, and lack of motivation to participate in treatment plan.  Kevin [name]. Kevin [name], PT, DPT

## 2013-12-25 NOTE — Progress Notes (Signed)
Occupational Therapy Session Note  Patient Details  Name: Kevin [name] MRN: [id number] Date of Birth: 28-Mar-1940  Today's Date: 12/25/2013 OT Individual Time: 1000-1040 OT Individual Time Calculation (min): 40 min    Short Term Goals: Week 2:  OT Short Term Goal 1 (Week 2): Patient will complete toilet transfer with mod assist and min cues for sequencing OT Short Term Goal 2 (Week 2): Patient will complete bathing from sitting and supine at supervision level OT Short Term Goal 3 (Week 2): Patient will sustain attention to self-care task for 10 min with min cues OT Short Term Goal 4 (Week 2): Patient will manage clothing around waist during LB dressing with min assist   Skilled Therapeutic Interventions/Progress Updates:    Pt resting in w/c upon arrival.  Pt declined bathing and dressing but did agree to engaging in therex with 3# weight bar and yellow theraband.  Weight bar exercises demonstrated and patient repeated exercises with min verbal cues to maintain correct form.  Pt issued yellow theraband and pt demonstrated exercises learned earlier.  Pt stated he couldn't do any more after 40 mins and declined further participation.  Therapy Documentation Precautions:  Precautions Precautions: Fall Precaution Comments: Pt with Lt AKA; wears prosthesis, burr hole evacuation Required Braces or Orthoses: Other Brace/Splint Other Brace/Splint: prosthesis Restrictions Weight Bearing Restrictions: No Other Position/Activity Restrictions: Son reports pt. stopped using prosthesis and began using wheelchair as primary mod of mobility due to pain in shoulders while using RW General: General OT Amount of Missed Time: 20 Minutes pt refused to continue secondary to fatigue  Pain: Pain Assessment Pain Assessment: No/denies pain Pain Score: 0-No pain  See FIM for current functional status  Therapy/Group: Individual Therapy  Leroy Libman 12/25/2013, 10:58 AM

## 2013-12-25 NOTE — Patient Care Conference (Addendum)
Inpatient RehabilitationTeam Conference and Plan of Care Update Date: 12/26/2013   Time: 3:10 PM    Patient Name: Kevin [name]      Medical Record Number: [id number]  Date of Birth: [birth year] Sex: Male         Room/Bed: 4W11C/4W11C-01 Payor Info: Payor: MEDICARE / Plan: MEDICARE PART A AND B / Product Type: *No Product type* /    Admitting Diagnosis: traumatic sdh  Admit Date/Time:  12/13/2013  2:59 PM Admission Comments: No comment available   Primary Diagnosis:  <principal problem not specified> Principal Problem: <principal problem not specified>  Patient Active Problem List   Diagnosis Date Noted  . Systolic dysfunction 50/11/3816  . Syncope 12/14/2013  . Traumatic subdural hemorrhage 12/13/2013  . Ventricular bigeminy 12/12/2013  . Subdural hematoma 12/05/2013  . 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 Subdural hematoma without coma 12/04/2013  . Amputee, 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  . Renal failure 01/11/2012  . Leucocytosis 01/11/2012  . DVT, femoral, acute 01/11/2012  . DVT of axillary vein, acute 01/04/2012  . Acute pulmonary embolism 01/04/2012  . 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 Severe sepsis(995.92) 01/02/2012  . Septic shock(785.52) 01/02/2012  . Hypotension 01/02/2012  . Right ventricular failure 01/02/2012  . 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 HTN (hypertension) 12/31/2011  . CAD (coronary artery disease) 12/31/2011  . CHF (congestive heart failure) 12/31/2011  . Tobacco abuse 12/31/2011  . Hyperlipidemia 12/31/2011    Expected Discharge Date: Expected Discharge Date: 01/02/14 (vs SNF)  Team Members Present: Physician leading conference: Dr. Alger Simons Social Worker Present: Lennart Pall, LCSW Nurse Present: Elliot Cousin, RN PT Present: Raylene Everts, PT;Bridgett Ripa, Cottie Banda, PT OT Present: Willeen Cass, Roland Earl, OT;Roanna Epley, COTA SLP Present: Other (comment) Venita Sheffield., ST) PPS Coordinator present : Daiva Nakayama, RN, CRRN      Current Status/Progress Goal Weekly Team Focus  Medical   poor participation and activity tolerance. headaches  improve interactions, see prior  pain control, motivational   Bowel/Bladder   Pt continent of bowel and bladder  remain continent manage b/b minimal assist  encoruage use of BSC instead bedpan    Swallow/Nutrition/ Hydration   Dys 3 textures and thin liquids, Min cues  Supervision  utilization of small bites/sips, trials of regular textures   ADL's   min-mod assist bathing and dressing from bed level (+2 assist for sit<>stand); max assist SBT to/from toilet, total assist toileting  supervision overall with mod assist toilet transfer and min assist toileting  sitting balance, activity tolerance, functional transfers, attention, awareness   Mobility   variable assist based on fatigue, setup, alertness. Min-Max for bed mobility, Mod-Total for transfers. Refusing to participate often  supervision overall, mod (I) for w/c propulsion  participation, motivation, alertness, pain management, bed mobility, transfers   Communication             Safety/Cognition/ Behavioral Observations  Mod-Max A  supervision for orientation, min A for problem solving, memory, attention and awareness   sustained attention, emergent awareness, problem solving, memory   Pain   pt complains of headache at times. 1 percocet prn q6 hrs   pain managed at 4 or less  assess pain and medicate as needed.   Skin   abrasion to right shin-tegaderm in place. Incision approximated to scalp.  Skin remains free of breakdown with minimal assist  assess skin qshift. moniotr for s/s of infection    Rehab Goals Patient on target to meet rehab goals: No Rehab Goals  Revised: very poor participation and most goals likely to be downgraded *See Care Plan and progress notes for long and short-term goals.  Barriers to Discharge: chronic, dependent behaviors    Possible Resolutions to Barriers:  see prior, ?SNF     Discharge Planning/Teaching Needs:  Original plan was for pt to d/c home with son providing 24/7 assist.  Have spoken with son about pt's poor participation and he is in agreement that if this does not improve then plan will need to change to SNF.      Team Discussion:  No progress this week and very poor participation of pt.  At times actually turns eyes and head away.  Currently requiring total assist +2 at times.  Son aware of pt's lack of progress with plans to speak with pt this evening.  SW to follow up with them both tomorrow.  SW notes both aware of the option of SNF if no changes to pt's level of participation.  Revisions to Treatment Plan:  Plan to change schedule to qd   Continued Need for Acute Rehabilitation Level of Care: The patient requires daily medical management by a physician with specialized training in physical medicine and rehabilitation for the following conditions: Daily direction of a multidisciplinary physical rehabilitation program to ensure safe treatment while eliciting the highest outcome that is of practical value to the patient.: Yes Daily medical management of patient stability for increased activity during participation in an intensive rehabilitation regime.: Yes Daily analysis of laboratory values and/or radiology reports with any subsequent need for medication adjustment of medical intervention for : Other;Post surgical problems;Neurological problems  Nariya Neumeyer 12/26/2013, 1:40 PM

## 2013-12-25 NOTE — Progress Notes (Signed)
[facility]YS[facility] MEDICINE & REHABILITATION     PROGRESS NOTE    Subjective/Complaints: No new complaints. Has headache. Slept ok.   Objective: Vital Signs: Blood pressure 125/82, pulse 72, temperature 98.2 F (36.8 C), temperature source Oral, resp. rate 18, height 5\' 7"  (1.702 m), weight 96.248 kg (212 lb 3 oz), SpO2 98.00%. No results found.  Recent Labs  12/25/13 0547  WBC 6.2  HGB 12.7*  HCT 39.2  PLT 180    Recent Labs  12/25/13 0547  NA 131*  K 4.8  CL 95*  GLUCOSE 93  BUN 24*  CREATININE 1.61*  CALCIUM 8.8   CBG (last 3)   Recent Labs  12/23/13 1609 12/23/13 2002 12/24/13 0704  GLUCAP 109* 171* 86    Wt Readings from Last 3 Encounters:  12/25/13 96.248 kg (212 lb 3 oz)  12/13/13 93 kg (205 lb 0.4 oz)  12/13/13 93 kg (205 lb 0.4 oz)    Physical Exam:  HENT: dentition fair Left curvilinear craniotomy site intact    Eyes:  Pupils reactive to light. anicteric  Neck: Neck supple. No thyromegaly present.  Cardiovascular: Regular rhythm. HR 75-80  Respiratory: Effort normal and breath sounds normal. No respiratory distress. No rales  GI: Soft. Bowel sounds are normal. He exhibits no distension.  Musculoskeletal:  Edematous left aka stump. RLE with evidence of multiple old healed abrasions on shin and knee. Neurological:  Flat, but  alert. Makes eye contact with cueing. Can follow simple commands.  Pupils reactive. RUE and RLE 3- to 3/5. 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 Physiatrist is providing close team supervision and 24 hour management of active medical problems listed below. Physiatrist and rehab team continue to assess barriers to discharge/monitor  patient progress toward functional and medical goals.  I challenged Kevin [name] to work through pain and fatigue if he wishes to make it home from here. He stated to me that he would try to work harder and that he still wished to be here in our program. Will discuss further with team today.   FIM: FIM - Bathing Bathing Steps Patient Completed: Chest;Abdomen;Left Arm;Right Arm;Front perineal area;Right upper leg;Left upper leg Bathing: 1: Two helpers (+2 sit<>stand)  FIM - Upper Body Dressing/Undressing Upper body dressing/undressing steps patient completed: Thread/unthread right sleeve of pullover shirt/dresss;Thread/unthread left sleeve of pullover shirt/dress;Put head through opening of pull over shirt/dress;Pull shirt over trunk Upper body dressing/undressing: 5: Set-up assist to: Obtain clothing/put away FIM - Lower Body Dressing/Undressing Lower body dressing/undressing steps patient completed: Thread/unthread left underwear leg;Thread/unthread right underwear leg Lower body dressing/undressing: 3: Mod-Patient completed 50-74% of tasks (completed supine in bed)  FIM - Toileting Toileting: 1: Total-Patient completed zero steps, helper did all 3  FIM - Radio producer Devices: Research officer, trade union Transfers: 2-To toilet/BSC: Max A (lift and lower assist);2-From toilet/BSC: Max A (lift and lower assist)  FIM - Control and instrumentation engineer Devices: Sliding board;Bed rails;Arm rests Bed/Chair Transfer: 0: Activity did not occur  FIM - Locomotion: Wheelchair Distance: 125 Locomotion: Wheelchair: 0: Activity did not occur FIM - Locomotion: Ambulation Ambulation/Gait Assistance: Not tested (comment) Locomotion: Ambulation: 0: Activity did not occur  Comprehension Comprehension Mode: Auditory Comprehension: 4-Understands basic 75 - 89% of the  time/requires cueing 10 - 24% of the time  Expression Expression Mode:  Verbal Expression: 5-Expresses basic needs/ideas: With extra time/assistive device  Social Interaction Social Interaction: 2-Interacts appropriately 25 - 49% of time - Needs frequent redirection.  Problem Solving Problem Solving: 3-Solves basic 50 - 74% of the time/requires cueing 25 - 49% of the time  Memory Memory: 3-Recognizes or recalls 50 - 74% of the time/requires cueing 25 - 49% of the time  Medical Problem List and Plan:  1. Functional deficits secondary to L frontoparietal SDH past falls in setting of anticoagulation.  2. DVT Prophylaxis/Anticoagulation: Mechanical: Antiembolism stockings, thigh (TED hose) Bilateral lower extremities  Sequential compression devices, below knee Bilateral lower extremities  3. Chronic pain?/ Headaches: percocet prn 4. Mood:   ? valium for anxiety at home. LCSW to follow for evaluation and supportive  5. Neuropsych: This patient is not capable of making decisions on his own behalf.  6. Skin/Wound Care: Routine pressure relief measures. Monitor for signs of breakdown.  7. Fluids/Electrolytes/Nutrition: Monitor I/O for adequate intake.   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   daily weights   Low salt diet.  Marland Kitchen Resumed Lipitor.    -need apical recording of HR---HR improved    -EP consult recs noted--consvt care   -off b-blocker per cards, avoid diuretics for now due to volume depletion  -weights trending back up---need to keep an eye on volume status (consider resuming diuretic) 9. Seizure prophylaxis: Continue Keppra bid.  10. Hyponatremia: likely central, may some volume depletion  -sodium currently 130-133           LOS (Days) 12 A FACE TO FACE EVALUATION WAS PERFORMED  [name],Kevin T 12/25/2013 8:00 AM

## 2013-12-26 ENCOUNTER — Inpatient Hospital Stay (HOSPITAL_COMMUNITY): Payer: Medicare Other

## 2013-12-26 ENCOUNTER — Inpatient Hospital Stay (HOSPITAL_COMMUNITY): Payer: Medicare Other | Admitting: Speech Pathology

## 2013-12-26 ENCOUNTER — Encounter (HOSPITAL_COMMUNITY): Payer: Medicare Other

## 2013-12-26 DIAGNOSIS — I509 Heart failure, unspecified: Secondary | ICD-10-CM

## 2013-12-26 DIAGNOSIS — I10 Essential (primary) hypertension: Secondary | ICD-10-CM

## 2013-12-26 DIAGNOSIS — I2510 Atherosclerotic heart disease of native coronary artery without angina pectoris: Secondary | ICD-10-CM

## 2013-12-26 LAB — BASIC METABOLIC PANEL
Anion gap: 12 (ref 5–15)
BUN: 25 mg/dL — ABNORMAL HIGH (ref 6–23)
CALCIUM: 8.9 mg/dL (ref 8.4–10.5)
CO2: 27 meq/L (ref 19–32)
CREATININE: 1.8 mg/dL — AB (ref 0.50–1.35)
Chloride: 95 mEq/L — ABNORMAL LOW (ref 96–112)
GFR, EST AFRICAN AMERICAN: 41 mL/min — AB (ref >90)
GFR, EST NON AFRICAN AMERICAN: 36 mL/min — AB (ref >90)
Glucose, Bld: 104 mg/dL — ABNORMAL HIGH (ref 70–99)
Potassium: 5.4 mEq/L — ABNORMAL HIGH (ref 3.7–5.3)
SODIUM: 134 meq/L — AB (ref 137–147)

## 2013-12-26 LAB — URINALYSIS, ROUTINE W REFLEX MICROSCOPIC
BILIRUBIN URINE: NEGATIVE
Glucose, UA: NEGATIVE mg/dL
KETONES UR: 15 mg/dL — AB
Nitrite: POSITIVE — AB
PH: 5 (ref 5.0–8.0)
Protein, ur: 100 mg/dL — AB
Specific Gravity, Urine: 1.021 (ref 1.005–1.030)
Urobilinogen, UA: 0.2 mg/dL (ref 0.0–1.0)

## 2013-12-26 LAB — URINE MICROSCOPIC-ADD ON

## 2013-12-26 NOTE — Plan of Care (Signed)
Problem: RH BOWEL ELIMINATION Goal: RH STG MANAGE BOWEL WITH ASSISTANCE STG Manage Bowel with minimal Assistance.  Outcome: Not Progressing Pt continues to request bedpan and refuses bsc.

## 2013-12-26 NOTE — Progress Notes (Signed)
[facility] MEDICINE & REHABILITATION     PROGRESS NOTE    Subjective/Complaints: No pain c/os, poor sleep last noc  Review of Systems - limited by cognition   Objective: Vital Signs: Blood pressure 123/74, pulse 76, temperature 99 F (37.2 C), temperature source Oral, resp. rate 17, height 5\' 7"  (1.702 m), weight 95.6 kg (210 lb 12.2 oz), SpO2 99.00%. No results found.  Recent Labs  12/25/13 0547  WBC 6.2  HGB 12.7*  HCT 39.2  PLT 180    Recent Labs  12/25/13 0547  NA 131*  K 4.8  CL 95*  GLUCOSE 93  BUN 24*  CREATININE 1.61*  CALCIUM 8.8   CBG (last 3)   Recent Labs  12/23/13 1609 12/23/13 2002 12/24/13 0704  GLUCAP 109* 171* 86    Wt Readings from Last 3 Encounters:  12/26/13 95.6 kg (210 lb 12.2 oz)  12/13/13 93 kg (205 lb 0.4 oz)  12/13/13 93 kg (205 lb 0.4 oz)    Physical Exam:  HENT: dentition fair Left curvilinear craniotomy site intact    Eyes:  Pupils reactive to light. anicteric  Neck: Neck supple. No thyromegaly present.  Cardiovascular: Regular rhythm. HR 75-80  Respiratory: Effort normal and breath sounds normal. No respiratory distress. No rales  GI: Soft. Bowel sounds are normal. He exhibits no distension.  Musculoskeletal:  Edematous left aka stump. RLE with evidence of multiple old healed abrasions on shin and knee. Neurological:  Flat, but  alert. Makes eye contact with cueing. Can follow simple commands.  Pupils reactive. RUE and RLE 3- to 3/5. 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 Physiatrist is providing close team supervision and 24 hour management of active medical problems listed below. Physiatrist and rehab team continue to  assess barriers to discharge/monitor patient progress toward functional and medical goals.  Pt remembered conversation with MD from yesterday , appears motivated for therapy   FIM: FIM - Bathing Bathing Steps Patient Completed: Chest;Abdomen;Left Arm;Right Arm;Front perineal area;Right upper leg;Left upper leg Bathing: 1: Two helpers (+2 sit<>stand)  FIM - Upper Body Dressing/Undressing Upper body dressing/undressing steps patient completed: Thread/unthread right sleeve of pullover shirt/dresss;Thread/unthread left sleeve of pullover shirt/dress;Put head through opening of pull over shirt/dress;Pull shirt over trunk Upper body dressing/undressing: 5: Set-up assist to: Obtain clothing/put away FIM - Lower Body Dressing/Undressing Lower body dressing/undressing steps patient completed: Thread/unthread left underwear leg;Thread/unthread right underwear leg Lower body dressing/undressing: 3: Mod-Patient completed 50-74% of tasks (completed supine in bed)  FIM - Toileting Toileting: 1: Two helpers  FIM - Radio producer Devices: Research officer, trade union Transfers: 1-Two helpers  FIM - Financial planner Transfer: 1: Two helpers;2: Sit > Supine: Max A (lifting assist/Pt. 25-49%)  FIM - Locomotion: Wheelchair Distance: 125 Locomotion: Wheelchair: 0: Activity did not occur FIM - Locomotion: Ambulation Ambulation/Gait Assistance: Not tested (comment) Locomotion: Ambulation: 0: Activity did not occur  Comprehension Comprehension Mode: Auditory Comprehension: 4-Understands basic 75 - 89% of the time/requires cueing 10 - 24% of the time  Expression Expression Mode: Verbal Expression: 5-Expresses basic 90% of the time/requires cueing < 10% of the time.  Social Interaction Social Interaction: 3-Interacts appropriately 50 - 74% of the time - May be  physically or verbally  inappropriate.  Problem Solving Problem Solving: 3-Solves basic 50 - 74% of the time/requires cueing 25 - 49% of the time  Memory Memory: 3-Recognizes or recalls 50 - 74% of the time/requires cueing 25 - 49% of the time  Medical Problem List and Plan:  1. Functional deficits secondary to L frontoparietal SDH past falls in setting of anticoagulation.  2. DVT Prophylaxis/Anticoagulation: Mechanical: Antiembolism stockings, thigh (TED hose) Bilateral lower extremities  Sequential compression devices, below knee Bilateral lower extremities  3. Chronic pain?/ Headaches: percocet prn 4. Mood:   ? valium for anxiety at home. LCSW to follow for evaluation and supportive  5. Neuropsych: This patient is not capable of making decisions on his own behalf.  6. Skin/Wound Care: Routine pressure relief measures. Monitor for signs of breakdown.  7. Fluids/Electrolytes/Nutrition: Monitor I/O for adequate intake.   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   daily weights   Low salt diet.  Marland Kitchen Resumed Lipitor.    -need apical recording of HR---HR improved    -EP consult recs noted--consvt care   -off b-blocker per cards, avoid diuretics for now due to volume depletion  -weights trending back up---need to keep an eye on volume status (consider resuming diuretic) 9. Seizure prophylaxis: Continue Keppra bid.  10. Hyponatremia: likely central, may some volume depletion  -sodium currently 130-133           LOS (Days) 13 A FACE TO FACE EVALUATION WAS PERFORMED  Alysia Penna E 12/26/2013 2:42 PM

## 2013-12-26 NOTE — Progress Notes (Signed)
Speech Language Pathology Daily Session Note  Patient Details  Name: Kevin [name] MRN: [id number] Date of Birth: 01/08/1941  Today's Date: 12/26/2013 SLP Individual Time: 0830-0900 SLP Individual Time Calculation (min): 30 min  Short Term Goals: Week 2: SLP Short Term Goal 1 (Week 2): Pt will initiate verbal and functional tasks with Min A multimodal cues.  SLP Short Term Goal 2 (Week 2): Patient will sustain attention to functional tasks for 10 minutes with Mod A multimodal cues.  SLP Short Term Goal 3 (Week 2): Pt will utilize external aids to recall new, daily information with Min A multimodal cues.  SLP Short Term Goal 4 (Week 2): Patient will label 2 physical and 2 cognitive deficits with Min A question cues SLP Short Term Goal 5 (Week 2): Patient will consume current diet with minimal overt s/s of aspiration with supervision verbal cues for utilization of swallowing compensatory strategies. SLP Short Term Goal 6 (Week 2): Patient will demonstrate efficient mastication with trials of regular textures with supervision verbal cues.   Skilled Therapeutic Interventions: Skilled treatment session focused on dysphagia and cognitive-linguistic goals. Student facilitated session by providing Min A verbal cues for utilization of small bites/sips and alternating solids and liquids during breakfast tray of Dys. 3 textures and thin liquids via cup and trials of regular textures. Patient without overt s/s of aspiration and no pocketing noted, therefore, recommend upgrade diet to regular textures and thin liquids with full supervision. Student also facilitated session by providing Mod A question cues for intellectual awareness of his current physical and cognitive deficits. Patient demonstrated sustained attention to a functional conversation for ~15 minutes with supervision verbal cues and independently requested to use the urinal but required supervision question cues for attention to task. Patient  left semi-reclined in bed with alarm in place. Continue with current plan of care.    FIM:  Comprehension Comprehension Mode: Auditory Comprehension: 4-Understands basic 75 - 89% of the time/requires cueing 10 - 24% of the time Expression Expression Mode: Verbal Expression: 5-Expresses basic 90% of the time/requires cueing < 10% of the time. Social Interaction Social Interaction: 3-Interacts appropriately 50 - 74% of the time - May be physically or verbally inappropriate. Problem Solving Problem Solving: 3-Solves basic 50 - 74% of the time/requires cueing 25 - 49% of the time Memory Memory: 3-Recognizes or recalls 50 - 74% of the time/requires cueing 25 - 49% of the time FIM - Eating Eating Activity: 5: Supervision/cues;4: Helper checks for pocketed food  Pain Pain Assessment Pain Assessment: No/denies pain Pain Score: 0-No pain Pain Type: Acute pain Pain Location: Back Pain Descriptors / Indicators: Aching Pain Intervention(s): Medication (See eMAR)  Therapy/Group: Individual Therapy  Twanisha Foulk 12/26/2013, 12:12 PM

## 2013-12-26 NOTE — Progress Notes (Signed)
Occupational Therapy Session Note  Patient Details  Name: JAMILL WETMORE MRN: [id number] Date of Birth: [birth year]  Today's Date: 12/26/2013 OT Individual Time: 1300-1400 OT Individual Time Calculation (min): 60 min    Short Term Goals: Week 2:  OT Short Term Goal 1 (Week 2): Patient will complete toilet transfer with mod assist and min cues for sequencing OT Short Term Goal 2 (Week 2): Patient will complete bathing from sitting and supine at supervision level OT Short Term Goal 3 (Week 2): Patient will sustain attention to self-care task for 10 min with min cues OT Short Term Goal 4 (Week 2): Patient will manage clothing around waist during LB dressing with min assist   Skilled Therapeutic Interventions/Progress Updates:    Pt seen for 1:1 OT session with focus on sitting balance, sitting tolerance, functional transfers, and cognitive remediation. Pt received supine in bed eating lunch. Pt agreeable to sitting unsupported EOB to finish lunch. Pt sat EOB at supervision level while eating lunch and engaging in therapeutic conversation in regards to discharge planning, pt's goals, memory, and attention. Pt required min cues for recall of 1 activity completed in SLP session this AM. Pt with poor intellectual awareness stating "she just picks on me and thinks I have something wrong." Discussed pt's goals and activities he needs to improve upon to get home to facilitate increased participation. Following lunch, completed dressing sitting EOB with min assist to manage clothing around waist using lateral lean technique (pt managed 75% around waist). Completed SBT bed>w/c with mod assist and w/c>bed with max assist and mod cues for technique. Pt assisted with positioning chair for transfer with min cues. Pt completed transfer bed>w/c again with mod assist and progressing to min cues. Pt requesting to use bathroom however declining transferring to [facility]. Pt transferred back to bed at max assist and required  total assist for clothing management. Pt left on bedpan with call light in hand and RN notified. Pt with improved participation this afternoon.   Therapy Documentation Precautions:  Precautions Precautions: Fall Precaution Comments: Pt with Lt AKA; wears prosthesis, burr hole evacuation Required Braces or Orthoses: Other Brace/Splint Other Brace/Splint: prosthesis Restrictions Weight Bearing Restrictions: No Other Position/Activity Restrictions: Son reports pt. stopped using prosthesis and began using wheelchair as primary mod of mobility due to pain in shoulders while using RW General:   Vital Signs: Therapy Vitals Temp: 97.8 F (36.6 C) Temp Source: Oral Pulse Rate: 68 Resp: 20 BP: 122/78 mmHg Patient Position (if appropriate): Sitting Oxygen Therapy SpO2: 99 % O2 Device: None (Room air) Pain: Pain Assessment Pain Score: 8  Pain Location: Back Pain Descriptors / Indicators: Aching Pain Intervention(s): Medication (See eMAR)  See FIM for current functional status  Therapy/Group: Individual Therapy  Duayne Cal 12/26/2013, 3:25 PM

## 2013-12-26 NOTE — Patient Care Conference (Signed)
Inpatient RehabilitationTeam Conference and Plan of Care Update Date: 12/26/2013   Time: 2:45 PM    Patient Name: Kevin [name]      Medical Record Number: [id number]  Date of Birth: [birth year] Sex: Male         Room/Bed: 4W11C/4W11C-01 Payor Info: Payor: MEDICARE / Plan: MEDICARE PART A AND B / Product Type: *No Product type* /    Admitting Diagnosis: traumatic sdh  Admit Date/Time:  12/13/2013  2:59 PM Admission Comments: No comment available   Primary Diagnosis:  <principal problem not specified> Principal Problem: <principal problem not specified>  Patient Active Problem List   Diagnosis Date Noted  . Systolic dysfunction 62/69/4854  . Syncope 12/14/2013  . Traumatic subdural hemorrhage 12/13/2013  . Ventricular bigeminy 12/12/2013  . Subdural hematoma 12/05/2013  . 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 Subdural hematoma without coma 12/04/2013  . Amputee, 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  . Renal failure 01/11/2012  . Leucocytosis 01/11/2012  . DVT, femoral, acute 01/11/2012  . DVT of axillary vein, acute 01/04/2012  . Acute pulmonary embolism 01/04/2012  . 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 Severe sepsis(995.92) 01/02/2012  . Septic shock(785.52) 01/02/2012  . Hypotension 01/02/2012  . Right ventricular failure 01/02/2012  . 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 HTN (hypertension) 12/31/2011  . CAD (coronary artery disease) 12/31/2011  . CHF (congestive heart failure) 12/31/2011  . Tobacco abuse 12/31/2011  . Hyperlipidemia 12/31/2011    Expected Discharge Date: Expected Discharge Date: 01/02/14 (vs SNF)  Team Members Present: Physician leading conference: Dr. Alger Simons Social Worker Present: Lennart Pall, LCSW Nurse Present: Elliot Cousin, RN PT Present: Raylene Everts, PT;Bridgett Ripa, Cottie Banda, PT OT Present: Willeen Cass, Roland Earl, OT;Roanna Epley, COTA SLP Present: Other (comment) Venita Sheffield., ST) PPS Coordinator present : Daiva Nakayama, RN, CRRN    Current Status/Progress Goal Weekly Team Focus  Medical   poor participation and activity tolerance. headaches  improve interactions, see prior  pain control, motivational   Bowel/Bladder   Pt continent of bowel and bladder  remain continent manage b/b minimal assist  encoruage use of BSC instead bedpan    Swallow/Nutrition/ Hydration   Dys 3 textures and thin liquids, Min cues  Supervision  utilization of small bites/sips, trials of regular textures   ADL's   min-mod assist bathing and dressing from bed level (+2 assist for sit<>stand); max assist SBT to/from toilet, total assist toileting  supervision overall with mod assist toilet transfer and min assist toileting  sitting balance, activity tolerance, functional transfers, attention, awareness   Mobility             Communication             Safety/Cognition/ Behavioral Observations  Mod-Max A  supervision for orientation, min A for problem solving, memory, attention and awareness   sustained attention, emergent awareness, problem solving, memory   Pain   pt complains of headache at times. 1 percocet prn q6 hrs   pain managed at 4 or less  assess pain and medicate as needed.   Skin   abrasion to right shin-tegaderm in place. Incision approximated to scalp.  Skin remains free of breakdown with minimal assist  assess skin qshift. moniotr for s/s of infection    Rehab Goals Patient on target to meet rehab goals: No Rehab Goals Revised: very poor participation and most goals likely to be downgraded *See Care Plan and progress notes for long and short-term goals.  Barriers to Discharge: chronic, dependent behaviors  Possible Resolutions to Barriers:  see prior, ?SNF    Discharge Planning/Teaching Needs:  Original plan was for pt to d/c home with son providing 24/7 assist.  Have spoken with son about pt's poor participation and he is in agreement that if this does not improve then plan will need to change to SNF.      Team  Discussion:  No progress and poor participations.  SW to follow up with son about this.  Revisions to Treatment Plan:  May need to change d/c plan and downgraded goals   Continued Need for Acute Rehabilitation Level of Care: The patient requires daily medical management by a physician with specialized training in physical medicine and rehabilitation for the following conditions: Daily direction of a multidisciplinary physical rehabilitation program to ensure safe treatment while eliciting the highest outcome that is of practical value to the patient.: Yes Daily medical management of patient stability for increased activity during participation in an intensive rehabilitation regime.: Yes Daily analysis of laboratory values and/or radiology reports with any subsequent need for medication adjustment of medical intervention for : Other;Post surgical problems;Neurological problems  Kevin [name] 12/26/2013, 8:48 AM

## 2013-12-26 NOTE — Progress Notes (Signed)
The skilled treatment note has been reviewed and SLP is in agreement. Aydrien Froman, M.A., CCC-SLP [phone]  

## 2013-12-26 NOTE — Progress Notes (Signed)
Physical Therapy Session Note  Patient Details  Name: Kevin [name] MRN: [id number] Date of Birth: 1940-09-23  Today's Date: 12/26/2013 PT Individual Time: 1410-1500 PT Individual Time Calculation (min): 50 min   Short Term Goals: Week 2:  PT Short Term Goal 1 (Week 2): Patient will perform bed mobility on flat bed with minA. PT Short Term Goal 2 (Week 2): Patient will perform slideboard transfers consistently at [facility] level. PT Short Term Goal 3 (Week 2): Patient will perform wheelchair mobility with supervision x150'.  Skilled Therapeutic Interventions/Progress Updates:    Pt received supine in bed on bed pan, agreeable to participate in therapy after finishing toileting. After 10 minutes therapist returned and pt reported he was finished. Pt rolled L/R for hygiene and clothing management w/ MinA. Session focused on seated trunk control, functional transfer training. Pt transferred w/ sliding board to different surfaces throughout session. Pt able to direct set up of sliding board transfer w/ mod cueing. Consistently requires ModA for transfer w/ intermittent MaxA. Pt required +2A 1x when performing uphill transfer, with +2A only to ensure pt did not slide back down. Seated EOM pt performed reaching tasks at mod LOS for seated trunk control and anterior weight shift to improve hip clearance during transfers. Pt propelled w/c 150' back to room w/ supervision, able to find room w/ min cueing. Pt left seated in w/c w/ QRB on and all needs within reach.   Therapy Documentation Precautions:  Precautions Precautions: Fall Precaution Comments: Pt with Lt AKA; wears prosthesis, burr hole evacuation Required Braces or Orthoses: Other Brace/Splint Other Brace/Splint: prosthesis Restrictions Weight Bearing Restrictions: No Other Position/Activity Restrictions: Son reports pt. stopped using prosthesis and began using wheelchair as primary mod of mobility due to pain in shoulders while using  RW General: PT Amount of Missed Time (min): 10 Minutes PT Missed Treatment Reason: Toileting Vital Signs: Therapy Vitals Temp: 97.8 F (36.6 C) Temp Source: Oral Pulse Rate: 68 Resp: 20 BP: 122/78 mmHg Patient Position (if appropriate): Sitting Oxygen Therapy SpO2: 99 % O2 Device: None (Room air) Pain: Pain Assessment Pain Assessment: No/denies pain Pain Score: 0-No pain Pain Location: Back Pain Descriptors / Indicators: Aching Pain Intervention(s): Medication (See eMAR)   See FIM for current functional status  Therapy/Group: Individual Therapy  Rada Hay Rada Hay, PT, DPT 12/26/2013, 4:32 PM

## 2013-12-27 ENCOUNTER — Inpatient Hospital Stay (HOSPITAL_COMMUNITY): Payer: Medicare Other | Admitting: *Deleted

## 2013-12-27 ENCOUNTER — Inpatient Hospital Stay (HOSPITAL_COMMUNITY): Payer: Medicare Other | Admitting: Speech Pathology

## 2013-12-27 ENCOUNTER — Encounter (HOSPITAL_COMMUNITY): Payer: Medicare Other

## 2013-12-27 MED ORDER — TRAMADOL HCL 50 MG PO TABS
50.0000 mg | ORAL_TABLET | Freq: Four times a day (QID) | ORAL | Status: DC | PRN
  Administered 2013-12-27 – 2014-01-02 (×14): 50 mg via ORAL
  Filled 2013-12-27 (×16): qty 1

## 2013-12-27 NOTE — Progress Notes (Signed)
The weekly progress and skilled treatment notes have been reviewed and SLP is in agreement. Gunnar Fusi, M.A., CCC-SLP [phone]

## 2013-12-27 NOTE — Progress Notes (Signed)
Occupational Therapy Session Note  Patient Details  Name: Kevin [name] MRN: [id number] Date of Birth: [birth year]  Today's Date: 12/27/2013 OT Individual Time: 1352-1407 OT Individual Time Calculation (min): 15 min    Short Term Goals: Week 2:  OT Short Term Goal 1 (Week 2): Patient will complete toilet transfer with mod assist and min cues for sequencing OT Short Term Goal 2 (Week 2): Patient will complete bathing from sitting and supine at supervision level OT Short Term Goal 3 (Week 2): Patient will sustain attention to self-care task for 10 min with min cues OT Short Term Goal 4 (Week 2): Patient will manage clothing around waist during LB dressing with min assist   Skilled Therapeutic Interventions/Progress Updates:    Pt seen for 15 min makeup session for missed minutes in AM. Pt completed BUE theraband exercises from w/c level 15 reps each. Pt then transferred back to bed with mod assist and min cues. Pt left supine in bed with all needs in reach.  Therapy Documentation Precautions:  Precautions Precautions: Fall Precaution Comments: Pt with Lt AKA; wears prosthesis, burr hole evacuation Required Braces or Orthoses: Other Brace/Splint Other Brace/Splint: prosthesis Restrictions Weight Bearing Restrictions: No Other Position/Activity Restrictions: Son reports pt. stopped using prosthesis and began using wheelchair as primary mod of mobility due to pain in shoulders while using RW General: General OT Amount of Missed Time: 10 Minutes Vital Signs: Therapy Vitals Temp: 97.9 F (36.6 C) Temp Source: Oral Pulse Rate: 82 Resp: 18 BP: 118/81 mmHg Patient Position (if appropriate): Lying Oxygen Therapy SpO2: 100 % Pain: Pain Assessment Pain Assessment: No/denies pain  See FIM for current functional status  Therapy/Group: Individual Therapy  Duayne Cal 12/27/2013, 2:38 PM

## 2013-12-27 NOTE — Progress Notes (Signed)
Social Work Patient ID: Kevin [name], male   DOB: [birth year], 73 y.o.   MRN: [id number]  Have reviewed team conference information with pt and son.  Discussing poor participation and progress with both.  Son up to speak with pt Tuesday evening and participation much improved yesterday and today so far.  Pt reports he understands he needs to participate in order to be able to d/c home.  Will monitor through this week and follow up with son for scheduling of family ed at beginning of next week.  Continue to plan toward a d/c home with son to provide 24/7 care.  Kevin Voorheis, LCSW

## 2013-12-27 NOTE — Progress Notes (Signed)
Occupational Therapy Session Note  Patient Details  Name: Kevin [name] MRN: [id number] Date of Birth: February 11, 1941  Today's Date: 12/27/2013 OT Individual Time: 6837-2902 OT Individual Time Calculation (min): 35 min    Short Term Goals: Week 2:  OT Short Term Goal 1 (Week 2): Patient will complete toilet transfer with mod assist and min cues for sequencing OT Short Term Goal 2 (Week 2): Patient will complete bathing from sitting and supine at supervision level OT Short Term Goal 3 (Week 2): Patient will sustain attention to self-care task for 10 min with min cues OT Short Term Goal 4 (Week 2): Patient will manage clothing around waist during LB dressing with min assist   Skilled Therapeutic Interventions/Progress Updates:    Pt seen for ADL retraining with focus on dynamic sitting balance, functional transfers, and activity tolerance. Pt received supine in bed using bedpan (pt missing 10 min). Pt agreeable to bathing sitting EOB. Completed with min assist using lateral lean technique and assist for thoroughness with buttocks hygiene. Completed LB dressing with increased time and assist to manage clothing over right hip with lateral lean technique. Completed SBT bed>w/c with mod assist and min cues as pt setup transfer. Pt then completed oral care from w/c level at sink. Pt demonstrated improved participation and motivation this session. Pt left sitting in w/c with MD present.   Therapy Documentation Precautions:  Precautions Precautions: Fall Precaution Comments: Pt with Lt AKA; wears prosthesis, burr hole evacuation Required Braces or Orthoses: Other Brace/Splint Other Brace/Splint: prosthesis Restrictions Weight Bearing Restrictions: No Other Position/Activity Restrictions: Son reports pt. stopped using prosthesis and began using wheelchair as primary mod of mobility due to pain in shoulders while using RW General: General OT Amount of Missed Time: 10 Minutes Vital Signs:    Pain:  No report of pain during therapy session.   See FIM for current functional status  Therapy/Group: Individual Therapy  Duayne Cal 12/27/2013, 10:46 AM

## 2013-12-27 NOTE — Progress Notes (Signed)
Physical Therapy Session Note  Patient Details  Name: Kevin [name] MRN: [id number] Date of Birth: 07/10/1940  Today's Date: 12/27/2013 PT Individual Time: 1130-1200 PT Individual Time Calculation (min): 30 min   Short Term Goals: Week 2:  PT Short Term Goal 1 (Week 2): Patient will perform bed mobility on flat bed with minA. PT Short Term Goal 2 (Week 2): Patient will perform slideboard transfers consistently at [facility] level. PT Short Term Goal 3 (Week 2): Patient will perform wheelchair mobility with supervision x150'.  Skilled Therapeutic Interventions/Progress Updates:   Pt received sitting in w/c, agreeable to therapy session.  Pt able to recall what he has worked on with OT as well as SLP previously in the day when questioned by PT.  Assisted pt to therapy gym at total A level in order to work on functional transfers with use of SB.  Pt able to recall set up of w/c, locking brakes, and removal of arm rest, however requires mod A cues for leg rest removal and to scoot forward prior to placing board.  Also requires cues and assist for performing lateral lean and placement of board to ensure in proper position.  Performed SB transfer w/c<>mat at min/mod A level (+2 for safety and to stabilize chair/board).  Assist to maintain RLE in contact with floor for leverage and cues for forward weight shift for head/hip relationship.  Once on mat, pt requires increased time to problem solve how to remove board and eventually max cues on how to do so.  Discussed home entry and need for a ramp for increased safety.  Pt states that he knows a carpenter that can build ramp, however will have CSW follow up with son to ensure ramp will be ready for D/C.  Transferred back to w/c as stated above with assist for placement of board so that pt could perform adequate lateral lean.  Pt propelled back to room with UEs.  Left in room with all needs in reach and quick release belt donned.   Therapy  Documentation Precautions:  Precautions Precautions: Fall Precaution Comments: Pt with Lt AKA; wears prosthesis, burr hole evacuation Required Braces or Orthoses: Other Brace/Splint Other Brace/Splint: prosthesis Restrictions Weight Bearing Restrictions: No Other Position/Activity Restrictions: Son reports pt. stopped using prosthesis and began using wheelchair as primary mod of mobility due to pain in shoulders while using RW   Pain: Pain Assessment Pain Score: 3   See FIM for current functional status  Therapy/Group: Individual Therapy  Denice Bors 12/27/2013, 12:41 PM

## 2013-12-27 NOTE — Plan of Care (Signed)
Problem: RH BOWEL ELIMINATION Goal: RH STG MANAGE BOWEL WITH ASSISTANCE STG Manage Bowel with minimal Assistance.  Outcome: Not Progressing Required supp for constipation

## 2013-12-27 NOTE — Progress Notes (Addendum)
[facility] PHYSICAL [facility]ICINE & REHABILITATION     PROGRESS NOTE    Subjective/Complaints: No R LE pain but has had swelling "trying to get off percs due to bowels"  Review of Systems - limited by cognition   Objective: Vital Signs: Blood pressure 108/74, pulse 74, temperature 98.4 F (36.9 C), temperature source Oral, resp. rate 18, height 5\' 7"  (1.702 m), weight 97.9 kg (215 lb 13.3 oz), SpO2 99.00%. No results found.  Recent Labs  12/25/13 0547  WBC 6.2  HGB 12.7*  HCT 39.2  PLT 180    Recent Labs  12/25/13 0547 12/26/13 1500  NA 131* 134*  K 4.8 5.4*  CL 95* 95*  GLUCOSE 93 104*  BUN 24* 25*  CREATININE 1.61* 1.80*  CALCIUM 8.8 8.9   CBG (last 3)  No results found for this basename: GLUCAP,  in the last 72 hours  Wt Readings from Last 3 Encounters:  12/27/13 97.9 kg (215 lb 13.3 oz)  12/13/13 93 kg (205 lb 0.4 oz)  12/13/13 93 kg (205 lb 0.4 oz)    Physical Exam:  HENT: dentition fair Left curvilinear craniotomy site intact    Eyes:  Pupils reactive to light. anicteric  Neck: Neck supple. No thyromegaly present.  Cardiovascular: Regular rhythm. HR 75-80  Respiratory: Effort normal and breath sounds normal. No respiratory distress. No rales  GI: Soft. Bowel sounds are normal. He exhibits no distension.  Musculoskeletal:  Edematous left aka stump. RLE with evidence of multiple old healed abrasions on shin and knee. Neurological:  Flat, but  alert. Makes eye contact with cueing. Can follow simple commands.  Pupils reactive. RUE and RLE 3- to 3/5. 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 Physiatrist is providing close team supervision and 24 hour management of active  medical problems listed below. Physiatrist and rehab team continue to assess barriers to discharge/monitor patient progress toward functional and medical goals.     FIM: FIM - Bathing Bathing Steps Patient Completed: Chest;Abdomen;Left Arm;Right Arm;Front perineal area;Right upper leg;Left upper leg Bathing: 1: Two helpers (+2 sit<>stand)  FIM - Upper Body Dressing/Undressing Upper body dressing/undressing steps patient completed: Thread/unthread right sleeve of pullover shirt/dresss;Thread/unthread left sleeve of pullover shirt/dress;Put head through opening of pull over shirt/dress;Pull shirt over trunk Upper body dressing/undressing: 5: Set-up assist to: Obtain clothing/put away FIM - Lower Body Dressing/Undressing Lower body dressing/undressing steps patient completed: Thread/unthread left underwear leg;Thread/unthread right underwear leg Lower body dressing/undressing: 3: Mod-Patient completed 50-74% of tasks  FIM - Toileting Toileting: 1: Two helpers  FIM - Radio producer Devices: Research officer, trade union Transfers: 1-Two helpers  FIM - Control and instrumentation engineer Devices: Sliding board;Arm rests;Bed rails Bed/Chair Transfer: 4: Supine > Sit: Min A (steadying Pt. > 75%/lift 1 leg);3: Supine > Sit: Mod A (lifting assist/Pt. 50-74%/lift 2 legs;2: Bed > Chair or W/C: Max A (lift and lower assist);2: Chair or W/C > Bed: Max A (lift and lower assist)  FIM - Locomotion: Wheelchair Distance: 125 Locomotion: Wheelchair: 5: Travels 150 ft or more: maneuvers on rugs and over door sills with supervision, cueing or coaxing FIM - Locomotion: Ambulation Ambulation/Gait Assistance: Not tested (comment) Locomotion: Ambulation: 0: Activity did not occur  Comprehension Comprehension Mode: Auditory Comprehension: 5-Understands complex  90% of the time/Cues < 10% of the time  Expression Expression Mode: Verbal Expression:  5-Expresses basic needs/ideas: With no assist  Social Interaction Social Interaction: 6-Interacts appropriately with others with medication or extra time (anti-anxiety, antidepressant).  Problem Solving Problem Solving: 5-Solves basic problems: With no assist  Memory Memory: 5-Requires cues to use assistive device  Medical Problem List and Plan:  1. Functional deficits secondary to L frontoparietal SDH past falls in setting of anticoagulation.  2. DVT Prophylaxis/Anticoagulation: Mechanical: Antiembolism stockings, thigh (TED hose) Bilateral lower extremities  Sequential compression devices, below knee ,. Has IVC filter 3. Chronic pain?/ Headaches: percocet prn 4. Mood:   ? valium for anxiety at home. LCSW to follow for evaluation and supportive  5. Neuropsych: This patient is not capable of making decisions on his own behalf.  6. Skin/Wound Care: Routine pressure relief measures. Monitor for signs of breakdown.  7. Fluids/Electrolytes/Nutrition: Monitor I/O for adequate intake.   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   daily weights   Low salt diet.  Marland Kitchen Resumed Lipitor.    -need apical recording of HR---HR improved    -EP consult recs noted--consvt care   -off b-blocker per cards, avoid diuretics for now due to volume depletion  -weights trending back up---need to keep an eye on volume status (consider resuming diuretic) 9. Seizure prophylaxis: Continue Keppra bid.  10. Hyponatremia: likely central, may some volume depletion  -sodium currently 130-133 11.  Left AKA has prosthesis  work with PT 12.  Hx R DVT IVC filter 2013       LOS (Days) 14 A FACE TO FACE EVALUATION WAS PERFORMED  Alysia Penna E 12/27/2013 10:17 AM
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
## 2013-12-28 ENCOUNTER — Inpatient Hospital Stay (HOSPITAL_COMMUNITY): Payer: Medicare Other

## 2013-12-28 ENCOUNTER — Inpatient Hospital Stay (HOSPITAL_COMMUNITY): Payer: Medicare Other | Admitting: *Deleted

## 2013-12-28 ENCOUNTER — Inpatient Hospital Stay (HOSPITAL_COMMUNITY): Payer: Medicare Other | Admitting: Speech Pathology

## 2013-12-28 LAB — BASIC METABOLIC PANEL
Anion gap: 9 (ref 5–15)
BUN: 22 mg/dL (ref 6–23)
CHLORIDE: 95 meq/L — AB (ref 96–112)
CO2: 27 mEq/L (ref 19–32)
CREATININE: 1.65 mg/dL — AB (ref 0.50–1.35)
Calcium: 8.8 mg/dL (ref 8.4–10.5)
GFR calc Af Amer: 46 mL/min — ABNORMAL LOW (ref >90)
GFR, EST NON AFRICAN AMERICAN: 40 mL/min — AB (ref >90)
GLUCOSE: 116 mg/dL — AB (ref 70–99)
POTASSIUM: 4.8 meq/L (ref 3.7–5.3)
Sodium: 131 mEq/L — ABNORMAL LOW (ref 137–147)

## 2013-12-28 MED ORDER — CEPHALEXIN 250 MG PO CAPS
250.0000 mg | ORAL_CAPSULE | Freq: Three times a day (TID) | ORAL | Status: DC
  Administered 2013-12-28 – 2013-12-31 (×9): 250 mg via ORAL
  Filled 2013-12-28 (×13): qty 1

## 2013-12-28 NOTE — Progress Notes (Signed)
Speech Language Pathology Daily Session Note  Patient Details  Name: Kevin [name] MRN: [id number] Date of Birth: [birth year]  Today's Date: 12/28/2013 SLP Individual Time: 0830-0930 SLP Individual Time Calculation (min): 60 min  Short Term Goals: Week 3: SLP Short Term Goal 1 (Week 3): Pt will initiate verbal and functional tasks with Supervision multimodal cues.  SLP Short Term Goal 2 (Week 3): Patient will sustain attention to functional tasks for 10 minutes with Min A multimodal cues.  SLP Short Term Goal 3 (Week 3): Pt will utilize external aids to recall new, daily information with Min A multimodal cues.  SLP Short Term Goal 4 (Week 3): Patient will label 2 physical and 2 cognitive deficits with Min A question cues SLP Short Term Goal 5 (Week 3): Patient will consume regular textures and thin liqudis with minimal overt s/s of aspiration with Supervision verbal cues for utilization of swallowing compensatory strategies.  Skilled Therapeutic Interventions: Skilled treatment session focused on addressing dysphagia and cognitive-linguistic goals. SLP facilitated session with [place] feded to [place] verbal uces to initiiate tray set-up assist and intermittent cues for selective attention to self-feeding with TV on.  Patient demonstrated no pocketing or overt s/s of aspiration and as a result, recommend upgrade to intermittent supervision with PO after set-up assist.  SLP also faciliated session with Max A multimodal cues to problem solve where to place medication in medication box.  However, after cues for where to place patient required Supervision cue for self-monitoring and correcting throuhgout the completion of the task.  Continue with current plan of care.    FIM:  Comprehension Comprehension Mode: Auditory Comprehension: 5-Understands basic 90% of the time/requires cueing < 10% of the time Expression Expression Mode: Verbal Expression: 5-Expresses basic 90% of the time/requires cueing <  10% of the time. Social Interaction Social Interaction: 5-Interacts appropriately 90% of the time - Needs monitoring or encouragement for participation or interaction. Problem Solving Problem Solving: 4-Solves basic 75 - 89% of the time/requires cueing 10 - 24% of the time Memory Memory: 4-Recognizes or recalls 75 - 89% of the time/requires cueing 10 - 24% of the time FIM - Eating Eating Activity: 5: Set-up assist for open containers;5: Set-up assist for cut food  Pain Pain Assessment Pain Assessment: No/denies pain  Therapy/Group: Individual Therapy  Carmelia Roller., CCC-SLP [phone]  [place] 12/28/2013, 9:39 AM

## 2013-12-28 NOTE — Plan of Care (Signed)
Problem: RH BOWEL ELIMINATION Goal: RH STG MANAGE BOWEL W/MEDICATION W/ASSISTANCE STG Manage Bowel with Medication with minimal Assistance.  Outcome: Not Progressing Requiring supp for bowel movement and use of bed pan

## 2013-12-28 NOTE — Plan of Care (Signed)
Problem: RH Bed Mobility Goal: LTG Patient will perform bed mobility with assist (PT) LTG: Patient will perform bed mobility with assistance, with/without cues (PT).  goal downgraded 10/16 due to poor progress  Problem: RH Bed to Chair Transfers Goal: LTG Patient will perform bed/chair transfers w/assist (PT) LTG: Patient will perform bed/chair transfers with assistance, with/without cues (PT).  goal downgraded 10/16 due to poor progress  Problem: RH Car Transfers Goal: LTG Patient will perform car transfers with assist (PT) LTG: Patient will perform car transfers with assistance (PT).  goal downgraded 10/16 due to poor progress  Problem: RH Furniture Transfers Goal: LTG Patient will perform furniture transfers w/assist (OT/PT LTG: Patient will perform furniture transfers with assistance (OT/PT).  goal downgraded 10/16 due to poor progress

## 2013-12-28 NOTE — Progress Notes (Signed)
Occupational Therapy Session Note  Patient Details  Name: Kevin [name] MRN: [id number] Date of Birth: 1941/01/09  Today's Date: 12/28/2013 OT Individual Time: 8366-2947 OT Individual Time Calculation (min): 45 min    Short Term Goals: Week 2:  OT Short Term Goal 1 (Week 2): Patient will complete toilet transfer with mod assist and min cues for sequencing OT Short Term Goal 2 (Week 2): Patient will complete bathing from sitting and supine at supervision level OT Short Term Goal 3 (Week 2): Patient will sustain attention to self-care task for 10 min with min cues OT Short Term Goal 4 (Week 2): Patient will manage clothing around waist during LB dressing with min assist   Skilled Therapeutic Interventions/Progress Updates:    Pt seen for ADL retraining with focus on lateral leans during LB self-care, functional transfers, and activity tolerance. Pt received supine in bed declining bathing this AM, however, agreeable to dressing. Completed dressing in supine and sitting EOB at setup assist and increased time as pt managed pants around waist rolling L<>R. Completed SBT bed>w/c with mod assist and min cues. Practiced SBT w/c<>BSC with mod cues for setup and mod-max assist for transfer. Pt propelled self around unit to laundry room and family room to retrieve coffee with focus on activity tolerance. Pt returned to room and left with all needs in reach.   Therapy Documentation Precautions:  Precautions Precautions: Fall Precaution Comments: Pt with Lt AKA; wears prosthesis, burr hole evacuation Required Braces or Orthoses: Other Brace/Splint Other Brace/Splint: prosthesis Restrictions Weight Bearing Restrictions: No Other Position/Activity Restrictions: Son reports pt. stopped using prosthesis and began using wheelchair as primary mod of mobility due to pain in shoulders while using RW General:   Vital Signs:   Pain: Pain Assessment Pain Assessment: No/denies pain  See FIM for  current functional status  Therapy/Group: Individual Therapy  Duayne Cal 12/28/2013, 12:01 PM

## 2013-12-28 NOTE — Progress Notes (Signed)
Physical Therapy Note  Patient Details  Name: Kevin [name] MRN: [id number] Date of Birth: [birth year] Today's Date: 12/28/2013  Patient missed 45 minutes of skilled physical therapy secondary to refusal. Extensive discussion with patient about discharge planning, discharge location, etc. Explained to patient guidelines for rehab stay and need for adherence to attending therapy. Additionally, explained to patient that he is already on QD schedule. Due to continued refusals, treatment team will need to reconvene on Monday and decide with patient and family what the best option is, as patient is not currently participating in rehab schedule.  Le Roy Alzora Ha, PT, DPT 12/28/2013, 1:32 PM

## 2013-12-28 NOTE — Progress Notes (Signed)
Patient reported to have strong urine with decrease in output and poor po intake. UCS ordered and showing gram negative rods--will start patient on Keflex. Recheck lytes as showing evidence of dehydration as well as recurrent hyperkalemia. Will add IVF is not better.

## 2013-12-28 NOTE — Progress Notes (Signed)
[place] PHYSICAL MEDICINE & REHABILITATION     PROGRESS NOTE    Subjective/Complaints: Ok in therapy "trying to get off percs due to bowels"  Review of Systems - limited by cognition   Objective: Vital Signs: Blood pressure 126/82, pulse 76, temperature 98.1 F (36.7 C), temperature source Oral, resp. rate 19, height 5\' 7"  (1.702 m), weight 96.8 kg (213 lb 6.5 oz), SpO2 100.00%. No results found. No results found for this basename: WBC, HGB, HCT, PLT,  in the last 72 hours  Recent Labs  12/26/13 1500  NA 134*  K 5.4*  CL 95*  GLUCOSE 104*  BUN 25*  CREATININE 1.80*  CALCIUM 8.9   CBG (last 3)  No results found for this basename: GLUCAP,  in the last 72 hours  Wt Readings from Last 3 Encounters:  12/28/13 96.8 kg (213 lb 6.5 oz)  12/13/13 93 kg (205 lb 0.4 oz)  12/13/13 93 kg (205 lb 0.4 oz)    Physical Exam:  HENT: dentition fair Left curvilinear craniotomy site intact    Eyes:  Pupils reactive to light. anicteric  Neck: Neck supple. No thyromegaly present.  Cardiovascular: Regular rhythm. HR 75-80  Respiratory: Effort normal and breath sounds normal. No respiratory distress. No rales  GI: Soft. Bowel sounds are normal. He exhibits no distension.  Musculoskeletal:  Edematous left aka stump. RLE with evidence of multiple old healed abrasions on shin and knee. Neurological:  Flat, but  alert. Makes eye contact with cueing. Can follow simple commands.  Pupils reactive. RUE and RLE 3- to 3/5. 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 Physiatrist is providing close team supervision and 24 hour management of active medical problems listed below. Physiatrist and rehab team continue  to assess barriers to discharge/monitor patient progress toward functional and medical goals.     FIM: FIM - Bathing Bathing Steps Patient Completed: Chest;Abdomen;Left Arm;Right Arm;Front perineal area;Right upper leg;Left upper leg Bathing: 4: Min-Patient completes 8-9 70f 10 parts or 75+ percent  FIM - Upper Body Dressing/Undressing Upper body dressing/undressing steps patient completed: Thread/unthread right sleeve of pullover shirt/dresss;Thread/unthread left sleeve of pullover shirt/dress;Put head through opening of pull over shirt/dress;Pull shirt over trunk Upper body dressing/undressing: 5: Set-up assist to: Obtain clothing/put away FIM - Lower Body Dressing/Undressing Lower body dressing/undressing steps patient completed: Thread/unthread left underwear leg;Thread/unthread right underwear leg;Don/Doff right sock Lower body dressing/undressing: 5: Set-up assist to: Obtain clothing  FIM - Toileting Toileting: 1: Two helpers  FIM - Radio producer Devices: Research officer, trade union Transfers: 2-To toilet/BSC: Max A (lift and lower assist);2-From toilet/BSC: Max A (lift and lower assist)  FIM - Control and instrumentation engineer Devices: Sliding board;Bed rails;Arm rests Bed/Chair Transfer: 4: Supine > Sit: Min A (steadying Pt. > 75%/lift 1 leg);3: Bed > Chair or W/C: Mod A (lift or lower assist)  FIM - Locomotion: Wheelchair Distance: 125 Locomotion: Wheelchair: 5: Travels 150 ft or more: maneuvers on rugs and over door sills with supervision, cueing or coaxing FIM - Locomotion: Ambulation Ambulation/Gait Assistance: Not tested (comment) Locomotion: Ambulation: 0: Activity did not occur  Comprehension Comprehension Mode: Auditory Comprehension: 5-Understands basic 90% of the time/requires cueing < 10% of the time  Expression Expression Mode: Verbal Expression: 5-Expresses basic 90%  of the time/requires cueing < 10% of  the time.  Social Interaction Social Interaction: 5-Interacts appropriately 90% of the time - Needs monitoring or encouragement for participation or interaction.  Problem Solving Problem Solving: 4-Solves basic 75 - 89% of the time/requires cueing 10 - 24% of the time  Memory Memory: 4-Recognizes or recalls 75 - 89% of the time/requires cueing 10 - 24% of the time  Medical Problem List and Plan:  1. Functional deficits secondary to L frontoparietal SDH past falls in setting of anticoagulation.  2. DVT Prophylaxis/Anticoagulation: Mechanical: Antiembolism stockings, thigh (TED hose) Bilateral lower extremities  Sequential compression devices, below knee ,. Has IVC filter 3. Chronic pain?/ Headaches: percocet prn 4. Mood:   ? valium for anxiety at home. LCSW to follow for evaluation and supportive  5. Neuropsych: This patient is not capable of making decisions on his own behalf.  6. Skin/Wound Care: Routine pressure relief measures. Monitor for signs of breakdown.  7. Fluids/Electrolytes/Nutrition: Monitor I/O for adequate intake.   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   daily weights   Low salt diet.  Marland Kitchen Resumed Lipitor.    -need apical recording of HR---HR improved    -EP consult recs noted--consvt care   -off b-blocker per cards, avoid diuretics for now due to volume depletion  -weights trending back up---need to keep an eye on volume status (consider resuming diuretic) 9. Seizure prophylaxis: Continue Keppra bid.  10. Hyponatremia: likely central, may some volume depletion  -sodium currently 130-133 11.  Left AKA has prosthesis  work with PT 12.  Hx R DVT IVC filter 2013       LOS (Days) 15 A FACE TO FACE EVALUATION WAS PERFORMED  Charlett Blake 12/28/2013 9:38 AM

## 2013-12-29 ENCOUNTER — Inpatient Hospital Stay (HOSPITAL_COMMUNITY): Payer: Medicare Other | Admitting: Physical Therapy

## 2013-12-29 DIAGNOSIS — R159 Full incontinence of feces: Secondary | ICD-10-CM

## 2013-12-29 NOTE — Progress Notes (Signed)
[place] PHYSICAL MEDICINE & REHABILITATION     PROGRESS NOTE    Subjective/Complaints: Patient started on Senokot, had loose incontinent stool this morning. Had been constipated prior to this time. Denies any bowel or bladder pain  Review of Systems - limited by cognition   Objective: Vital Signs: Blood pressure 119/80, pulse 79, temperature 98.9 F (37.2 C), temperature source Oral, resp. rate 18, height 5\' 7"  (1.702 m), weight 93.7 kg (206 lb 9.1 oz), SpO2 98.00%. No results found. No results found for this basename: WBC, HGB, HCT, PLT,  in the last 72 hours  Recent Labs  12/26/13 1500 12/28/13 1059  NA 134* 131*  K 5.4* 4.8  CL 95* 95*  GLUCOSE 104* 116*  BUN 25* 22  CREATININE 1.80* 1.65*  CALCIUM 8.9 8.8   CBG (last 3)  No results found for this basename: GLUCAP,  in the last 72 hours  Wt Readings from Last 3 Encounters:  12/29/13 93.7 kg (206 lb 9.1 oz)  12/13/13 93 kg (205 lb 0.4 oz)  12/13/13 93 kg (205 lb 0.4 oz)    Physical Exam:  HENT: dentition fair Left curvilinear craniotomy site intact    Eyes:  Pupils reactive to light. anicteric  Neck: Neck supple. No thyromegaly present.  Cardiovascular: Regular rhythm. HR 75-80  Respiratory: Effort normal and breath sounds normal. No respiratory distress. No rales  GI: Soft. Bowel sounds are normal. He exhibits no distension.  Musculoskeletal:  Edematous left aka stump. RLE with evidence of multiple old healed abrasions on shin and knee. Neurological:  Flat, but  alert. Makes eye contact with cueing. Can follow simple commands.  Pupils reactive. RUE and RLE 3- to 3/5. 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 Physiatrist is  providing close team supervision and 24 hour management of active medical problems listed below. Physiatrist and rehab team continue to assess barriers to discharge/monitor patient progress toward functional and medical goals.     FIM: FIM - Bathing Bathing Steps Patient Completed: Chest;Abdomen;Left Arm;Right Arm;Front perineal area;Right upper leg;Left upper leg Bathing: 4: Min-Patient completes 8-9 34f 10 parts or 75+ percent  FIM - Upper Body Dressing/Undressing Upper body dressing/undressing steps patient completed: Thread/unthread right sleeve of pullover shirt/dresss;Thread/unthread left sleeve of pullover shirt/dress;Put head through opening of pull over shirt/dress;Pull shirt over trunk Upper body dressing/undressing: 5: Set-up assist to: Obtain clothing/put away FIM - Lower Body Dressing/Undressing Lower body dressing/undressing steps patient completed: Thread/unthread left underwear leg;Thread/unthread right underwear leg;Don/Doff right sock Lower body dressing/undressing: 5: Set-up assist to: Obtain clothing  FIM - Toileting Toileting: 1: Two helpers  FIM - Radio producer Devices: Research officer, trade union Transfers: 2-To toilet/BSC: Max A (lift and lower assist);2-From toilet/BSC: Max A (lift and lower assist)  FIM - Control and instrumentation engineer Devices: Sliding board;Bed rails;Arm rests Bed/Chair Transfer: 0: Activity did not occur  FIM - Locomotion: Wheelchair Distance: 125 Locomotion: Wheelchair: 0: Activity did not occur FIM - Locomotion: Ambulation Ambulation/Gait Assistance: Not tested (comment) Locomotion: Ambulation: 0: Activity did not occur  Comprehension Comprehension Mode: Auditory Comprehension: 5-Understands complex 90% of the time/Cues < 10% of the time  Expression Expression Mode: Verbal Expression: 5-Expresses complex 90% of the time/cues < 10% of the time  Technical sales engineer  Social  Interaction: 5-Interacts appropriately 90% of the time - Needs monitoring or encouragement for participation or interaction.  Problem Solving Problem Solving: 4-Solves basic 75 - 89% of the time/requires cueing 10 - 24% of the time  Memory Memory: 4-Recognizes or recalls 75 - 89% of the time/requires cueing 10 - 24% of the time  Medical Problem List and Plan:  1. Functional deficits secondary to L frontoparietal SDH past falls in setting of anticoagulation.  2. DVT Prophylaxis/Anticoagulation: Mechanical: Antiembolism stockings, thigh (TED hose) Bilateral lower extremities  Sequential compression devices, below knee ,. Has IVC filter 3. Chronic pain?/ Headaches: percocet prn 4. Mood:   ? valium for anxiety at home. LCSW to follow for evaluation and supportive  5. Neuropsych: This patient is not capable of making decisions on his own behalf.  6. Skin/Wound Care: Routine pressure relief measures. Monitor for signs of breakdown.  7. Fluids/Electrolytes/Nutrition: Monitor I/O for adequate intake.   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   daily weights   Low salt diet.  Marland Kitchen Resumed Lipitor.    -need apical recording of HR---HR improved    -EP consult recs noted--consvt care   -off b-blocker per cards, avoid diuretics for now due to volume depletion  -weights trending back up---need to keep an eye on volume status (consider resuming diuretic) 9. Seizure prophylaxis: Continue Keppra bid.  10. Hyponatremia: likely central, may some volume depletion  -sodium currently 130-133 11.  Left AKA has prosthesis  work with PT 12.  Hx R DVT IVC filter 2013    13.  Bowel incontinence likely secondary to medication as well as poor awareness. Will DC Senokot  LOS (Days) 16 A FACE TO FACE EVALUATION WAS PERFORMED  Manny Vitolo E 12/29/2013 11:03 AM

## 2013-12-29 NOTE — Progress Notes (Signed)
Physical Therapy Session Note  Patient Details  Name: Kevin [name] MRN: [id number] Date of Birth: [birth year]  Today's Date: 12/29/2013 PT Individual Time: 0800-0900 PT Individual Time Calculation (min): 60 min   Short Term Goals: Week 1:  PT Short Term Goal 1 (Week 1): Pt will roll R and L at mod A level PT Short Term Goal 1 - Progress (Week 1): Met PT Short Term Goal 2 (Week 1): Pt will perform static sitting balance at S level without UE support PT Short Term Goal 2 - Progress (Week 1): Met PT Short Term Goal 3 (Week 1): Pt will perform dynamic sitting balance with single UE support x 2 mins at min A level PT Short Term Goal 3 - Progress (Week 1): Met PT Short Term Goal 4 (Week 1): Pt will perform SB transfers at max A of single therapist PT Short Term Goal 4 - Progress (Week 1): Met  Skilled Therapeutic Interventions/Progress Updates:  Pt was seen bedside in the am, pt willing to participate with therapy. Pt rolled multiple times in bed with side rails and increased time, S and verbal cues to don brief and shorts. Pt transferred supine to edge of bed with side rail and S with increased time. Pt tolerated edge of bed about 5 minutes with S, pt able to don t-shirt with S only at edge of bed. Pt transferred edge of bed to w/c with sliding board and mod A with multiple cues for technique. Pt propelled w/c to gym with B UEs and S. Pt transferred w/c to mat and mat to w/c with sliding board and mod A with multiple verbal cues. Pt transferred edge of mat to supine and supine to edge of mat with S and verbal cues. Pt performed LE exercises on mat for strengthening and flexibility. Pt propelled w/c back to room with B UEs and S. Pt transferred w/c to drop arm commode, drop arm commode to w/c with sliding board, mod A and multiple verbal cues. Pt dependent for hygiene. Pt left sitting up in w/c with quick release belt in place and call bell within reach at end of treatment session.   Therapy  Documentation Precautions:  Precautions Precautions: Fall Precaution Comments: Pt with Lt AKA; wears prosthesis, burr hole evacuation Required Braces or Orthoses: Other Brace/Splint Other Brace/Splint: prosthesis Restrictions Weight Bearing Restrictions: No Other Position/Activity Restrictions: Son reports pt. stopped using prosthesis and began using wheelchair as primary mod of mobility due to pain in shoulders while using RW General:   Pain: No c/o pain.    See FIM for current functional status  Therapy/Group: Individual Therapy  Kevin, [name] 12/29/2013, 12:27 PM

## 2013-12-30 ENCOUNTER — Inpatient Hospital Stay (HOSPITAL_COMMUNITY): Payer: Medicare Other | Admitting: Physical Therapy

## 2013-12-30 NOTE — Progress Notes (Signed)
[place] PHYSICAL MEDICINE & REHABILITATION     PROGRESS NOTE    Subjective/Complaints: No further stool incontinence since discontinuation of Senokot  Review of Systems - limited by cognition   Objective: Vital Signs: Blood pressure 124/89, pulse 77, temperature 98.5 F (36.9 C), temperature source Oral, resp. rate 18, height 5\' 7"  (1.702 m), weight 93.3 kg (205 lb 11 oz), SpO2 100.00%. No results found. No results found for this basename: WBC, HGB, HCT, PLT,  in the last 72 hours  Recent Labs  12/28/13 1059  NA 131*  K 4.8  CL 95*  GLUCOSE 116*  BUN 22  CREATININE 1.65*  CALCIUM 8.8   CBG (last 3)  No results found for this basename: GLUCAP,  in the last 72 hours  Wt Readings from Last 3 Encounters:  12/30/13 93.3 kg (205 lb 11 oz)  12/13/13 93 kg (205 lb 0.4 oz)  12/13/13 93 kg (205 lb 0.4 oz)    Physical Exam:  HENT: dentition fair Left curvilinear craniotomy site intact    Eyes:  Pupils reactive to light. anicteric  Neck: Neck supple. No thyromegaly present.  Cardiovascular: Regular rhythm. HR 75-80  Respiratory: Effort normal and breath sounds normal. No respiratory distress. No rales  GI: Soft. Bowel sounds are normal. He exhibits no distension.  Musculoskeletal:  Edematous left aka stump. RLE with evidence of multiple old healed abrasions on shin and knee. Neurological:  Flat, but  alert. Makes eye contact with cueing. Can follow simple commands.  Pupils reactive. RUE and RLE 3- to 3/5. 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 Physiatrist is providing close team supervision and 24 hour management of active medical problems listed below. Physiatrist and rehab team  continue to assess barriers to discharge/monitor patient progress toward functional and medical goals.     FIM: FIM - Bathing Bathing Steps Patient Completed: Chest;Abdomen;Left Arm;Right Arm;Front perineal area;Right upper leg;Left upper leg Bathing: 4: Min-Patient completes 8-9 45f 10 parts or 75+ percent  FIM - Upper Body Dressing/Undressing Upper body dressing/undressing steps patient completed: Thread/unthread right sleeve of pullover shirt/dresss;Thread/unthread left sleeve of pullover shirt/dress;Put head through opening of pull over shirt/dress;Pull shirt over trunk Upper body dressing/undressing: 5: Set-up assist to: Obtain clothing/put away FIM - Lower Body Dressing/Undressing Lower body dressing/undressing steps patient completed: Thread/unthread left underwear leg;Thread/unthread right underwear leg;Don/Doff right sock Lower body dressing/undressing: 5: Set-up assist to: Obtain clothing  FIM - Toileting Toileting: 1: Two helpers  FIM - Radio producer Devices: Research officer, trade union Transfers: 3-To toilet/BSC: Mod A (lift or lower assist);3-From toilet/BSC: Mod A (lift or lower assist)  FIM - Control and instrumentation engineer Devices: Sliding board;Arm rests;Bed rails Bed/Chair Transfer: 5: Supine > Sit: Supervision (verbal cues/safety issues);5: Sit > Supine: Supervision (verbal cues/safety issues);4: Bed > Chair or W/C: Min A (steadying Pt. > 75%);3: Bed > Chair or W/C: Mod A (lift or lower assist);4: Chair or W/C > Bed: Min A (steadying Pt. > 75%);3: Chair or W/C > Bed: Mod A (lift or lower assist)  FIM - Locomotion: Wheelchair Distance: 125 Locomotion: Wheelchair: 5: Travels 150 ft or more: maneuvers on rugs and over door sills with supervision, cueing or coaxing FIM - Locomotion: Ambulation Ambulation/Gait Assistance: Not tested (comment) Locomotion:  Ambulation: 0: Activity did not  occur  Comprehension Comprehension Mode: Auditory Comprehension: 5-Understands complex 90% of the time/Cues < 10% of the time  Expression Expression Mode: Verbal Expression: 5-Expresses complex 90% of the time/cues < 10% of the time  Social Interaction Social Interaction: 5-Interacts appropriately 90% of the time - Needs monitoring or encouragement for participation or interaction.  Problem Solving Problem Solving: 4-Solves basic 75 - 89% of the time/requires cueing 10 - 24% of the time  Memory Memory: 4-Recognizes or recalls 75 - 89% of the time/requires cueing 10 - 24% of the time  Medical Problem List and Plan:  1. Functional deficits secondary to L frontoparietal SDH past falls in setting of anticoagulation.  2. DVT Prophylaxis/Anticoagulation: Mechanical: Antiembolism stockings, thigh (TED hose) Bilateral lower extremities  Sequential compression devices, below knee ,. Has IVC filter 3. Chronic pain?/ Headaches: percocet prn 4. Mood:   ? valium for anxiety at home. LCSW to follow for evaluation and supportive  5. Neuropsych: This patient is not capable of making decisions on his own behalf.  6. Skin/Wound Care: Routine pressure relief measures. Monitor for signs of breakdown.  7. Fluids/Electrolytes/Nutrition: Monitor I/O for adequate intake.   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   daily weights   Low salt diet.  Marland Kitchen Resumed Lipitor.    -need apical recording of HR---HR improved    -EP consult recs noted--consvt care   -off b-blocker per cards, avoid diuretics for now due to volume depletion  -weights trending back up---need to keep an eye on volume status (consider resuming diuretic) 9. Seizure prophylaxis: Continue Keppra bid.  10. Hyponatremia: likely central, may some volume depletion  -sodium currently 130-133 11.  Left AKA has prosthesis  work with PT 12.  Hx R DVT IVC filter 2013    13.  Bowel incontinence improved off  Senokot  LOS (Days) 17 A FACE TO FACE EVALUATION WAS PERFORMED  Alysia Penna E 12/30/2013 10:32 AM

## 2013-12-30 NOTE — Progress Notes (Signed)
Physical Therapy Session Note  Patient Details  Name: Kevin [name] MRN: [id number] Date of Birth: 25-May-1940  Today's Date: 12/30/2013 PT Individual Time: 0800-0900 PT Individual Time Calculation (min): 60 min   Short Term Goals: Week 1:  PT Short Term Goal 1 (Week 1): Pt will roll R and L at mod A level PT Short Term Goal 1 - Progress (Week 1): Met PT Short Term Goal 2 (Week 1): Pt will perform static sitting balance at S level without UE support PT Short Term Goal 2 - Progress (Week 1): Met PT Short Term Goal 3 (Week 1): Pt will perform dynamic sitting balance with single UE support x 2 mins at min A level PT Short Term Goal 3 - Progress (Week 1): Met PT Short Term Goal 4 (Week 1): Pt will perform SB transfers at max A of single therapist PT Short Term Goal 4 - Progress (Week 1): Met  Skilled Therapeutic Interventions/Progress Updates:  Pt was seen bedside in the am. Pt willing to participate with therapy. Pt rolled R/L multiple times with side rail and S with increased time. Pt transferred supine to edge of bed with head of bed elevated, side rail and S with increased time. Treatment focused on transfers with sliding board as well as bed mobility. Pt able to perform supine to edge of mat/bed, edge of mat/bed to supine with S and increased time. Pt performed sliding board transfers from bed to w/c, w/c to mat, and mat to w/c multiple times with min to mod A and verbal cues for technique. Pt performed w/c mobility 150 feet x 2 with B UEs and S.    Therapy Documentation Precautions:  Precautions Precautions: Fall Precaution Comments: Pt with Lt AKA; wears prosthesis, burr hole evacuation Required Braces or Orthoses: Other Brace/Splint Other Brace/Splint: prosthesis Restrictions Weight Bearing Restrictions: No Other Position/Activity Restrictions: Son reports pt. stopped using prosthesis and began using wheelchair as primary mod of mobility due to pain in shoulders while using  RW General:   Pain: No c/o pain.  See FIM for current functional status  Therapy/Group: Individual Therapy  Kevin [name], Kevin [name] 12/30/2013, 10:31 AM

## 2013-12-31 ENCOUNTER — Inpatient Hospital Stay (HOSPITAL_COMMUNITY): Payer: Medicare Other | Admitting: *Deleted

## 2013-12-31 ENCOUNTER — Encounter (HOSPITAL_COMMUNITY): Payer: Medicare Other

## 2013-12-31 ENCOUNTER — Inpatient Hospital Stay (HOSPITAL_COMMUNITY): Payer: Medicare Other | Admitting: Speech Pathology

## 2013-12-31 LAB — URINE CULTURE: Colony Count: >=100000

## 2013-12-31 MED ORDER — CEFIXIME 400 MG PO TABS
400.0000 mg | ORAL_TABLET | Freq: Every day | ORAL | Status: DC
  Administered 2013-12-31 – 2014-01-02 (×3): 400 mg via ORAL
  Filled 2013-12-31 (×4): qty 1

## 2013-12-31 NOTE — Progress Notes (Signed)
Speech Language Pathology Daily Session Note  Patient Details  Name: Kevin [name] MRN: [id number] Date of Birth: [birth year]  Today's Date: 12/31/2013 SLP Individual Time: 25-1425 SLP Individual Time Calculation (min): 30 min  Short Term Goals: Week 3: SLP Short Term Goal 1 (Week 3): Pt will initiate verbal and functional tasks with Supervision multimodal cues.  SLP Short Term Goal 2 (Week 3): Patient will sustain attention to functional tasks for 10 minutes with Min A multimodal cues.  SLP Short Term Goal 3 (Week 3): Pt will utilize external aids to recall new, daily information with Min A multimodal cues.  SLP Short Term Goal 4 (Week 3): Patient will label 2 physical and 2 cognitive deficits with Min A question cues SLP Short Term Goal 5 (Week 3): Patient will consume regular textures and thin liqudis with minimal overt s/s of aspiration with Supervision verbal cues for utilization of swallowing compensatory strategies.  Skilled Therapeutic Interventions: Skilled treatment session focused on cognitive goals. Upon arrival, patient was sitting upright in his wheelchair and agreeable to participate in treatment and propelled the wheelchair to the speech office with Mod I. SLP facilitated session by providing Max A multimodal cues for recall of the function of his current medications with use of a visual aid and for functional problem solving and organization during medication management while utilizing a pill box. Patient required Mod A multimodal cues for emergent awareness into difficulty of task and the need for supervision from family to ensure proper completion at discharge. Patient propelled himself back to his room and all needs were left within reach. Continue with current plan of care.     FIM:  Comprehension Comprehension Mode: Auditory Comprehension: 5-Understands basic 90% of the time/requires cueing < 10% of the time Expression Expression Mode: Verbal Expression:  5-Expresses basic needs/ideas: With extra time/assistive device Social Interaction Social Interaction: 5-Interacts appropriately 90% of the time - Needs monitoring or encouragement for participation or interaction. Problem Solving Problem Solving: 4-Solves basic 75 - 89% of the time/requires cueing 10 - 24% of the time Memory Memory: 4-Recognizes or recalls 75 - 89% of the time/requires cueing 10 - 24% of the time  Pain Pain Assessment Pain Assessment: No/denies pain  Therapy/Group: Individual Therapy  [name] 12/31/2013, 3:35 PM
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
## 2013-12-31 NOTE — Progress Notes (Signed)
Physical Therapy Weekly Progress Note  Patient Details  Name: Kevin [name] MRN: [id number] Date of Birth: [birth year]  Beginning of progress report period: December 24, 2013 End of progress report period: December 31, 2013  Today's Date: 12/31/2013 PT Individual Time: 0900-0945 PT Individual Time Calculation (min): 45 min   Patient has met 3 of 3 short term goals. Despite decreased participation, patient has achieved 3/3 STGs during this reporting period. Patient is currently performing at an overall minA level. Patient is currently demonstrating behaviors consistent with [place] Level VII.  Patient continues to demonstrate the following deficits: poor activity tolerance, balance, postural control, functional use of extremities, alternating attention, awareness, coordination, and therefore will continue to benefit from skilled PT intervention to enhance overall performance with activity tolerance, balance, postural control, ability to compensate for deficits, functional use of  right upper extremity, right lower extremity, left upper extremity and left lower extremity, attention, awareness, coordination and knowledge of precautions.  Patient progressing toward long term goals..  Continue plan of care.  PT Short Term Goals Week 1:  PT Short Term Goal 1 (Week 1): Pt will roll R and L at mod A level PT Short Term Goal 1 - Progress (Week 1): Met PT Short Term Goal 2 (Week 1): Pt will perform static sitting balance at S level without UE support PT Short Term Goal 2 - Progress (Week 1): Met PT Short Term Goal 3 (Week 1): Pt will perform dynamic sitting balance with single UE support x 2 mins at min A level PT Short Term Goal 3 - Progress (Week 1): Met PT Short Term Goal 4 (Week 1): Pt will perform SB transfers at max A of single therapist PT Short Term Goal 4 - Progress (Week 1): Met  Skilled Therapeutic Interventions/Progress Updates:    Patient received supine in bed. Session focused on bed  mobility, functional transfers, wheelchair mobility, and LE therex/stretching. Wheelchair mobility >150' with supervision. Patient performing bed mobility from flat surface without rails and supervision, bed<>wheelchair and wheelchair<>mat transfers with minA progressing to min guard to assist with maintaining R foot placement on floor. Patient able to perform full set up for transfers without assist or cues. In supine, therex: SLR, R LE bridging, R LE hamstring/gastroc/soleus stretching; also focus on rolling to B sides and sit<>supine. Patient returned to room and left sitting in wheelchair with all needs within reach.  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 stopped using prosthesis and began using wheelchair as primary mod of mobility due to pain in shoulders while using RW Pain: Pain Assessment Pain Assessment: 0-10 Pain Score: 7  Pain Location: Hip Pain Orientation: Left Pain Descriptors / Indicators: Aching Pain Onset: On-going Locomotion : Ambulation Ambulation/Gait Assistance: Not tested (comment) Wheelchair Mobility Distance: 150   See FIM for current functional status  Therapy/Group: Individual Therapy  Lillia Abed. Ameirah Khatoon, PT, DPT 12/31/2013, 9:32 AM

## 2013-12-31 NOTE — Progress Notes (Signed)
Occupational Therapy Session Note  Patient Details  Name: Kevin [name] MRN: [id number] Date of Birth: 1940/12/31  Today's Date: 12/31/2013 OT Individual Time: 9728-2060 OT Individual Time Calculation (min): 45 min    Short Term Goals: Week 2:  OT Short Term Goal 1 (Week 2): Patient will complete toilet transfer with mod assist and min cues for sequencing OT Short Term Goal 2 (Week 2): Patient will complete bathing from sitting and supine at supervision level OT Short Term Goal 3 (Week 2): Patient will sustain attention to self-care task for 10 min with min cues OT Short Term Goal 4 (Week 2): Patient will manage clothing around waist during LB dressing with min assist   Skilled Therapeutic Interventions/Progress Updates:    Pt resting in w/c upon arrival.  Pt declined bathing and dressing this morning but agreeable to going to therapy gym.  Pt propelled w/c to gym and engaged in BUE therex on UE Ergometer and with 5# weight bars.  Focus on activity tolerance, w/c mobility, and safety awareness.  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 stopped using prosthesis and began using wheelchair as primary mod of mobility due to pain in shoulders while using RW   Pain: Pain Assessment Pain Assessment: No/denies pain  See FIM for current functional status  Therapy/Group: Individual Therapy  Leroy Libman 12/31/2013, 11:39 AM

## 2014-01-01 ENCOUNTER — Inpatient Hospital Stay (HOSPITAL_COMMUNITY): Payer: Medicare Other | Admitting: *Deleted

## 2014-01-01 ENCOUNTER — Encounter (HOSPITAL_COMMUNITY): Payer: Medicare Other

## 2014-01-01 ENCOUNTER — Inpatient Hospital Stay (HOSPITAL_COMMUNITY): Payer: Medicare Other | Admitting: Speech Pathology

## 2014-01-01 NOTE — Progress Notes (Signed)
Speech Language Pathology Daily Session Note  Patient Details  Name: Kevin [name] MRN: [id number] Date of Birth: September 07, 1940  Today's Date: 01/01/2014 SLP Individual Time: 1030-1100 SLP Individual Time Calculation (min): 30 min  Short Term Goals: Week 3: SLP Short Term Goal 1 (Week 3): Pt will initiate verbal and functional tasks with Supervision multimodal cues.  SLP Short Term Goal 2 (Week 3): Patient will sustain attention to functional tasks for 10 minutes with Min A multimodal cues.  SLP Short Term Goal 3 (Week 3): Pt will utilize external aids to recall new, daily information with Min A multimodal cues.  SLP Short Term Goal 4 (Week 3): Patient will label 2 physical and 2 cognitive deficits with Min A question cues SLP Short Term Goal 5 (Week 3): Patient will consume regular textures and thin liqudis with minimal overt s/s of aspiration with Supervision verbal cues for utilization of swallowing compensatory strategies.  Skilled Therapeutic Interventions: Skilled treatment session focused on cognitive goals and preparing for discharge home. SLP facilitated session by engaging patient in a functional conversation in regards to discharge home and need for 24 hour supervision to ensure safety. Patient verbalized understanding and reported he "doesn't trust himself" without assistance. Patient also verbalized the need for organization and assistance with medications. Patient demonstrated selective attention to conversation for 30 minutes with Mod I and recalled morning events with Mod I. Overall, the patient has made good cognitive gains and verbalized appropriate anticipatory awareness for discharge home.    FIM:  Comprehension Comprehension Mode: Auditory Comprehension: 5-Understands basic 90% of the time/requires cueing < 10% of the time Expression Expression Mode: Verbal Expression: 5-Expresses basic 90% of the time/requires cueing < 10% of the time. Social Interaction Social  Interaction: 5-Interacts appropriately 90% of the time - Needs monitoring or encouragement for participation or interaction. Problem Solving Problem Solving: 5-Solves basic 90% of the time/requires cueing < 10% of the time Memory Memory: 5-Recognizes or recalls 90% of the time/requires cueing < 10% of the time  Pain No/Denies Pain   Therapy/Group: Individual Therapy  Hena Ewalt 01/01/2014, 2:06 PM

## 2014-01-01 NOTE — Progress Notes (Signed)
Occupational Therapy Session Note  Patient Details  Name: JUSHUA WALTMAN MRN: [id number] Date of Birth: 1940-08-12  Today's Date: 01/01/2014 OT Individual Time: 4403-4742 OT Individual Time Calculation (min): 45 min    Short Term Goals: Week 2:  OT Short Term Goal 1 (Week 2): Patient will complete toilet transfer with mod assist and min cues for sequencing OT Short Term Goal 2 (Week 2): Patient will complete bathing from sitting and supine at supervision level OT Short Term Goal 3 (Week 2): Patient will sustain attention to self-care task for 10 min with min cues OT Short Term Goal 4 (Week 2): Patient will manage clothing around waist during LB dressing with min assist   Skilled Therapeutic Interventions/Progress Updates:    Pt resting in w/c upon arrival and agreed to bathing and dressing at bed level.  Pt performed SBT w/c<>bed with steady A.  Pt completed all bathing and dressing tasks with supervision at bed level.  Pt performed SBT w/c<>drop arm BSC and performed toileting tasks with steady A.  Pt completed all grooming tasks seated in w/c at sink.  Focus on activity tolerance, functional transfers, safety awareness, and w/c mobility.  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 stopped using prosthesis and began using wheelchair as primary mod of mobility due to pain in shoulders while using RW   Pain: Pain Assessment Pain Assessment: No/denies pain Pain Score: 0-No pain  See FIM for current functional status  Therapy/Group: Individual Therapy  Leroy Libman 01/01/2014, 11:50 AM

## 2014-01-01 NOTE — Progress Notes (Signed)
[place] PHYSICAL MEDICINE & REHABILITATION     PROGRESS NOTE    Subjective/Complaints: No new complaints. Slept well.    Review of Systems - limited by cognition   Objective: Vital Signs: Blood pressure 125/69, pulse 71, temperature 97.7 F (36.5 C), temperature source Oral, resp. rate 18, height 5\' 7"  (1.702 m), weight 94 kg (207 lb 3.7 oz), SpO2 97.00%. No results found. No results found for this basename: WBC, HGB, HCT, PLT,  in the last 72 hours No results found for this basename: NA, K, CL, CO, GLUCOSE, BUN, CREATININE, CALCIUM,  in the last 72 hours CBG (last 3)  No results found for this basename: GLUCAP,  in the last 72 hours  Wt Readings from Last 3 Encounters:  01/01/14 94 kg (207 lb 3.7 oz)  12/13/13 93 kg (205 lb 0.4 oz)  12/13/13 93 kg (205 lb 0.4 oz)    Physical Exam:  HENT: dentition fair Left curvilinear craniotomy site intact    Eyes:  Pupils reactive to light. anicteric  Neck: Neck supple. No thyromegaly present.  Cardiovascular: Regular rhythm. HR 75-80  Respiratory: Effort normal and breath sounds normal. No respiratory distress. No rales  GI: Soft. Bowel sounds are normal. He exhibits no distension.  Musculoskeletal:   . RLE with healing multiple old healed abrasions on shin and knee. Neurological:  Flat, but  alert. Makes eye contact with cueing. Can follow simple commands.  Pupils reactive. RUE and RLE   3/5. Oriented to place, reason he's here Skin:  Left AKA site is well-healed  Psych: flat, cooperative   Assessment/Plan: 1. 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 Physiatrist is providing close team supervision and 24 hour management of active medical problems listed below. Physiatrist and rehab team continue to assess barriers to discharge/monitor patient progress toward functional and medical goals.  Participation  remains an issue. Pt's insight is poor---review in team conference today,.   FIM: FIM - Bathing Bathing Steps Patient Completed: Chest;Abdomen;Left Arm;Right Arm;Front perineal area;Right upper leg;Left upper leg Bathing: 4: Min-Patient completes 8-9 60f 10 parts or 75+ percent  FIM - Upper Body Dressing/Undressing Upper body dressing/undressing steps patient completed: Thread/unthread right sleeve of pullover shirt/dresss;Thread/unthread left sleeve of pullover shirt/dress;Put head through opening of pull over shirt/dress;Pull shirt over trunk Upper body dressing/undressing: 5: Set-up assist to: Obtain clothing/put away FIM - Lower Body Dressing/Undressing Lower body dressing/undressing steps patient completed: Thread/unthread left underwear leg;Thread/unthread right underwear leg;Don/Doff right sock Lower body dressing/undressing: 5: Set-up assist to: Obtain clothing  FIM - Toileting Toileting: 1: Two helpers  FIM - Radio producer Devices: Research officer, trade union Transfers: 3-To toilet/BSC: Mod A (lift or lower assist);3-From toilet/BSC: Mod A (lift or lower assist)  FIM - Control and instrumentation engineer Devices: Sliding board;Arm rests Bed/Chair Transfer: 5: Sit > Supine: Supervision (verbal cues/safety issues);4: Bed > Chair or W/C: Min A (steadying Pt. > 75%);4: Chair or W/C > Bed: Min A (steadying Pt. > 75%);5: Supine > Sit: Supervision (verbal cues/safety issues)  FIM - Locomotion: Wheelchair Distance: 150 Locomotion: Wheelchair: 5: Travels 150 ft or more: maneuvers on rugs and over door sills with supervision, cueing or coaxing FIM - Locomotion: Ambulation Ambulation/Gait Assistance: Not tested (comment) Locomotion: Ambulation: 0: Activity did not occur  Comprehension Comprehension Mode: Auditory Comprehension: 5-Understands basic 90% of the time/requires cueing < 10% of the time  Expression Expression Mode:  Verbal Expression: 5-Expresses basic needs/ideas: With no assist  Social Interaction Social Interaction: 5-Interacts appropriately 90% of the time - Needs monitoring or encouragement for participation or interaction.  Problem Solving Problem Solving: 4-Solves basic 75 - 89% of the time/requires cueing 10 - 24% of the time  Memory Memory: 4-Recognizes or recalls 75 - 89% of the time/requires cueing 10 - 24% of the time  Medical Problem List and Plan:  1. Functional deficits secondary to L frontoparietal SDH past falls in setting of anticoagulation.  2. DVT Prophylaxis/Anticoagulation: Mechanical: Antiembolism stockings, thigh (TED hose) Bilateral lower extremities  Sequential compression devices, below knee ,. Has IVC filter 3. Chronic pain?/ Headaches: percocet prn 4. Mood:   ? valium for anxiety at home. LCSW to follow for evaluation and supportive  5. Neuropsych: This patient is not capable of making decisions on his own behalf.  6. Skin/Wound Care: Routine pressure relief measures. Monitor for signs of breakdown.  7. Fluids/Electrolytes/Nutrition: Monitor I/O for adequate intake.   -labs, renal function near baseline   8. CAD with NISCM:Off Xarelto due to falls.   daily weights   Low salt diet.   Resumed Lipitor.    -need apical recording of HR---HR improved    -EP consult recs noted--consvt care   -off b-blocker per cards, avoid diuretics for now due to volume depletion  -weights trending back up---need to keep an eye on volume status (consider resuming diuretic) 9. Seizure prophylaxis: Continue Keppra bid.  10. Hyponatremia: likely central, may some volume depletion  -sodium currently 130-133 11.  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  Hx R DVT IVC filter 2013    13.  Bowel incontinence improved off Senokot  LOS (Days) 19 A FACE TO FACE EVALUATION WAS PERFORMED  SWARTZ,ZACHARY T 01/01/2014 8:14 AM

## 2014-01-01 NOTE — Progress Notes (Signed)
Physical Therapy Session Note  Patient Details  Name: Kevin [name] MRN: [id number] Date of Birth: [birth year]  Today's Date: 01/01/2014 PT Individual Time: 0800-0845 PT Individual Time Calculation (min): 45 min   Short Term Goals: Week 3:  PT Short Term Goal 1 (Week 3): STGs=LTGs  Skilled Therapeutic Interventions/Progress Updates:    Patient received semi-reclined in bed, agreeable to therapy. Session focused on all aspects of functional mobility in preparation for possible d/c tomorrow, 01/02/14. Patient performing bed mobility from flat surface without rails and supervision, min guard-minA for bed<>wheelchair transfers with use of slideboard, wheelchair mobility >200' with mod I, and minA for car transfer to car of sedan height (without use of slideboard). Patient reports he did not use slideboard for car transfers PTA, patient performs lateral/scooting transfer with minA. Educated patient that slideboard transfer would be safer and decrease risk of skin breakdown secondary to friction on glutes when performing scooting transfer across wheel of wheelchair. Additionally, patient reports he did not use slide board often for transfers, but reports several falls while transferring. Ongoing education with patient about how slide board increases safety and decreases risk for falls; patient verbalized understanding, but unsure if patient will implement use of slide board once he is discharged. Patient returned to room and left sitting in wheelchair with all needs within reach.  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 stopped using prosthesis and began using wheelchair as primary mod of mobility due to pain in  shoulders while using RW Pain: Pain Assessment Pain Assessment: No/denies pain Pain Score: 0-No pain Locomotion : Ambulation Ambulation/Gait Assistance: Not tested (comment) Wheelchair Mobility Distance: 150   See FIM for current functional status  Therapy/Group: Individual Therapy  Lillia Abed. Joeann Steppe, PT, DPT 01/01/2014, 8:58 AM

## 2014-01-02 ENCOUNTER — Inpatient Hospital Stay (HOSPITAL_COMMUNITY): Payer: Medicare Other

## 2014-01-02 ENCOUNTER — Encounter (HOSPITAL_COMMUNITY): Payer: Medicare Other

## 2014-01-02 ENCOUNTER — Inpatient Hospital Stay (HOSPITAL_COMMUNITY): Payer: Medicare Other | Admitting: Speech Pathology

## 2014-01-02 MED ORDER — ATORVASTATIN CALCIUM 20 MG PO TABS: 20.0000 mg | ORAL_TABLET | Freq: Every day | ORAL | Status: DC

## 2014-01-02 MED ORDER — TRAMADOL HCL 50 MG PO TABS: 50.0000 mg | ORAL_TABLET | Freq: Four times a day (QID) | ORAL | Status: DC | PRN

## 2014-01-02 MED ORDER — PANTOPRAZOLE SODIUM 40 MG PO TBEC: 40.0000 mg | DELAYED_RELEASE_TABLET | Freq: Every day | ORAL | Status: DC

## 2014-01-02 MED ORDER — LEVETIRACETAM 500 MG PO TABS: 500.0000 mg | ORAL_TABLET | Freq: Two times a day (BID) | ORAL | Status: DC

## 2014-01-02 MED ORDER — TAMSULOSIN HCL 0.4 MG PO CAPS: 0.4000 mg | ORAL_CAPSULE | Freq: Every day | ORAL | Status: DC

## 2014-01-02 MED ORDER — CEFIXIME 400 MG PO TABS: 400.0000 mg | ORAL_TABLET | Freq: Every day | ORAL | Status: DC

## 2014-01-02 MED ORDER — HYDROCERIN EX CREA: 1.0000 "application " | TOPICAL_CREAM | Freq: Two times a day (BID) | CUTANEOUS | Status: DC

## 2014-01-02 MED ORDER — CARVEDILOL 6.25 MG PO TABS: 6.2500 mg | ORAL_TABLET | Freq: Two times a day (BID) | ORAL | Status: DC

## 2014-01-02 NOTE — Patient Care Conference (Signed)
Inpatient RehabilitationTeam Conference and Plan of Care Update Date: 01/01/2014   Time: 3:05 PM    Patient Name: Kevin [name]      Medical Record Number: [id number]  Date of Birth: 06-26-1940 Sex: Male         Room/Bed: 4W11C/4W11C-01 Payor Info: Payor: MEDICARE / Plan: MEDICARE PART A AND B / Product Type: *No Product type* /    Admitting Diagnosis: traumatic sdh  Admit Date/Time:  12/13/2013  2:59 PM Admission Comments: No comment available   Primary Diagnosis:  <principal problem not specified> Principal Problem: <principal problem not specified>  Patient Active Problem List   Diagnosis Date Noted  . Systolic dysfunction 18/84/1660  . Syncope 12/14/2013  . Traumatic subdural hemorrhage 12/13/2013  . Ventricular bigeminy 12/12/2013  . Subdural hematoma 12/05/2013  . 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 Subdural hematoma without coma 12/04/2013  . Amputee, 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  . Renal failure 01/11/2012  . Leucocytosis 01/11/2012  . DVT, femoral, acute 01/11/2012  . DVT of axillary vein, acute 01/04/2012  . Acute pulmonary embolism 01/04/2012  . 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 Severe sepsis(995.92) 01/02/2012  . Septic shock(785.52) 01/02/2012  . Hypotension 01/02/2012  . Right ventricular failure 01/02/2012  . 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 HTN (hypertension) 12/31/2011  . CAD (coronary artery disease) 12/31/2011  . CHF (congestive heart failure) 12/31/2011  . Tobacco abuse 12/31/2011  . Hyperlipidemia 12/31/2011    Expected Discharge Date: Expected Discharge Date: 01/02/14  Team Members Present: Physician leading conference: Dr. Alger Simons Social Worker Present: Lennart Pall, LCSW Nurse Present: Elliot Cousin, RN PT Present: Melene Plan, Cottie Banda, PT OT Present: Willeen Cass, Roland Earl, OT;Roanna Epley, COTA SLP Present: Weston Anna, SLP PPS Coordinator present : Daiva Nakayama, RN, CRRN     Current Status/Progress Goal  Weekly Team Focus  Medical   improved therapy participation, making an effort  see prior  finalize dc planning   Bowel/Bladder   Pt cont of  bladder using urinal and no BM since 10/18; occ requires need for supp  cont of B+B   monitor need for laxative or supp   Swallow/Nutrition/ Hydration   Regular textures with thin liquids, Intermittent Supervision  Supervision  Family education    ADL's   min-mod assist bathing and dressing from bed level; mod A SBT  supervision overall with mod assist toilet transfer and min assist toileting  sitting balance, activity tolerance, transfers, awareness   Mobility   minA overall  goals downgraded to minA overall due to poor participation and high refusal rate  participation, transfers, bed mobility, sitting balance, wheelchair mobility, set up for transfers, education, safety   Communication             Safety/Cognition/ Behavioral Observations  Supervision-Min A  supervision for orientation, min A for problem solving, memory, attention and awareness   Family Education    Pain   HA managed with ultram and tylenol; soreness in lft hip managed with prn tylenol  Pain at or below 4  assess effectiveness of medicationa and need for prn   Skin   abrasion rt shin-tegaderm; incision healing  no skin integrity breakdown  assess skin  q shift    Rehab Goals Patient on target to meet rehab goals: Yes Rehab Goals Revised: better participation past couple of days *See Care Plan and progress notes for long and short-term goals.  Barriers to Discharge: see prior    Possible Resolutions to Barriers:  family ed  Discharge Planning/Teaching Needs:  Plan home with son tomorrow after family education completed      Team Discussion:  Some better participation this week and reaching min assist goals.  Family education with son tomorrow and d/c following.  Revisions to Treatment Plan:  None   Continued Need for Acute Rehabilitation Level of Care: The  patient requires daily medical management by a physician with specialized training in physical medicine and rehabilitation for the following conditions: Daily direction of a multidisciplinary physical rehabilitation program to ensure safe treatment while eliciting the highest outcome that is of practical value to the patient.: Yes Daily medical management of patient stability for increased activity during participation in an intensive rehabilitation regime.: Yes Daily analysis of laboratory values and/or radiology reports with any subsequent need for medication adjustment of medical intervention for : Post surgical problems;Neurological problems  Kevin [name] 01/02/2014, 10:25 AM

## 2014-01-02 NOTE — Discharge Summary (Signed)
Physician Discharge Summary  Patient ID: Kevin [name] MRN: [id number] DOB/AGE: 1940-11-10 73 y.o.  Admit date: 12/13/2013 Discharge date: 01/02/2014  Discharge Diagnoses:  Principal Problem:   Traumatic subdural hemorrhage Active Problems:   HTN (hypertension)   Systolic dysfunction   Syncope   Dehydration   Bradycardia   Discharged Condition: Stable   Labs:  Basic Metabolic Panel:    Component Value Date/Time   NA 131* 12/28/2013 1059   K 4.8 12/28/2013 1059   CL 95* 12/28/2013 1059   CO2 27 12/28/2013 1059   GLUCOSE 116* 12/28/2013 1059   BUN 22 12/28/2013 1059   CREATININE 1.65* 12/28/2013 1059   CALCIUM 8.8 12/28/2013 1059   GFRNONAA 40* 12/28/2013 1059   GFRAA 46* 12/28/2013 1059      CBC: CBC Latest Ref Rng 12/25/2013 12/21/2013 12/14/2013  WBC 4.0 - 10.5 K/uL 6.2 5.5 6.7  Hemoglobin 13.0 - 17.0 g/dL 12.7(L) 12.9(L) 14.2  Hematocrit 39.0 - 52.0 % 39.2 39.5 43.4  Platelets 150 - 400 K/uL 180 156 147(L)     CBG: No results found for this basename: GLUCAP,  in the last 168 hours  Brief HPI:   Kevin [name] is a 73 y.o. RH- male with H/o HTN, diastolic CHF, CAD--on Xarelto, L-AKA with SB transfers and increased falls recently with cognitive changes. He  was evaluated in ED on 12/04/13 and CT head showed a large isodense subdural on the left frontal and left posterior parietal region with significant mass effect and 17 mm midline shift. He was taken to OR for burr hole for evacuation of chronic left SDH on 12/05/2013 per Dr. Cyndy Freeze.  As drainage resolved drains removed and follow up CCT stable with 1 cm evolving L-frontotemporal parenchymal contusion. Patient not anticoagulation candidate due to falls and has IVC filter in place since 2013 due to h/o PE/DVT. Dr. Angelena Form consulted for input on ventricular bigeminy and recommended continuing BB as electrolytes stable and follow up on outpatient basis. Cardiac enzymes negative.  Patient with resultant cognitive  deficits with difficulty following command, lack of awareness of deficits as well as difficulty with mobility. CIR was recommended for follow up therapy.     Hospital Course: Kevin [name] was admitted to rehab 12/13/2013 for inpatient therapies to consist of PT, ST and OT at least three hours five days a week. Past admission physiatrist, therapy team and rehab RN have worked together to provide customized collaborative inpatient rehab. Hospital course complicated by syncopal episode on 10/02 due to dehydration. He was treated briefly with gentle hydration and diuretics were discontinued. Daily weights were monitored with close watch for signs of overload. Cardiology was consulted for input and felt that syncope likely due to volume depletion and repeat echo showed marked decrease in EF c/w 2013.  Dr. Lovena Le was cosulted for input on recurrent LVD and dense ventricular ectopy. He felt that patient was not a candidate for ICD implantation and recommended conservative treatment with optimization of HF medications. Patient was noted to have bradycardia with heart rate as low as 30's therefore coreg was discontinued and no ACE/ARB used secondary to hyperkalemia/AKI. Apical heart rate has been checked during his stay and was up in 60-70 range at discharge.   IVF were discontinued as renal status improved and rehab RN has encourage po fluid intake. ABLA is stable. He was reported to have odoriferous urine and UCS was ordered. This was positive for  Enterobacter UTI and he was treated with one week course of  Suprax. Headaches have improved and he continues on tramadol and tylenol prn for management of symptoms. Left craniotomy incision has healed well and staples were removed without difficulty. Patient has has variable participation in therapy requiring much encouragement daily participation.  He is at min assist overall and family is to provide assistance needed past discharge. Family reports that cognition is at  baseline and no further ST needed past discharge. He will continue to receive HHPT  HHOT and [place] via [facility] past discharge. On 01/02/14, he was discharged to home in improved condition.    Rehab course: During patient's stay in rehab weekly team conferences were held to monitor patient's progress, set goals and discuss barriers to discharge. Patient has had improvement in activity tolerance, balance, postural control, as well as ability to compensate for deficits. He is modified independent for sitting balance. He requires min assist for transfers and min assist to supervision level for bathing and dressing tasks. He is currently, patient is demonstrating behaviors consistent with a Rancho Level VIII and requires Min-Supervision assist to perform basic and familiar tasks safely. He is tolerating regular textures with thin liquids without overt s/s of aspiration and is modified independent for  utilization of swallowing compensatory strategies. Family education was done with son regarding need for care past discharge.   Disposition: 06-Home-Health Care Svc  Diet: Cardiac diet.   Special Instructions: 1. Needs assistance with medication management.  2. Drink plenty of water daily.  3. Check weights daily. Contact cardiology if weight starts going up, if you develop shortness of breath or  notice increase in swelling of extremities.     Medication List    STOP taking these medications       carvedilol 6.25 MG tablet  Commonly known as:  COREG     diazepam 5 MG tablet  Commonly known as:  VALIUM     furosemide 40 MG tablet  Commonly known as:  LASIX     XARELTO 20 MG Tabs tablet  Generic drug:  rivaroxaban      TAKE these medications       atorvastatin 20 MG tablet  Commonly known as:  LIPITOR  Take 1 tablet (20 mg total) by mouth daily.     cefixime 400 MG tablet  Commonly known as:  SUPRAX  Take 1 tablet (400 mg total) by mouth daily.     hydrocerin Crea  Apply 1  application topically 2 (two) times daily. To dry flaky areas     levETIRAcetam 500 MG tablet  Commonly known as:  KEPPRA  Take 1 tablet (500 mg total) by mouth 2 (two) times daily.     pantoprazole 40 MG tablet  Commonly known as:  PROTONIX  Take 1 tablet (40 mg total) by mouth at bedtime.     tamsulosin 0.4 MG Caps capsule  Commonly known as:  FLOMAX  Take 1 capsule (0.4 mg total) by mouth daily.     traMADol 50 MG tablet--Rx # 75 pills   Commonly known as:  ULTRAM  Take 1 tablet (50 mg total) by mouth every 6 (six) hours as needed for moderate pain or severe pain.       Follow-up Information   Follow up with Sherren Mocha, MD On 01/07/2014. (appointment at 9:30 am)    Specialty:  Cardiology   Contact information:   [address] [place] [address] [phone]       Follow up with Meredith Staggers, MD On  01/28/2014. (Be there at 12:00 for 12:20 pm appointment)    Specialty:  Physical Medicine and Rehabilitation   Contact information:   [address], [address] [place] [address] [phone]       Follow up with CABBELL,KYLE L, MD. Call today. (for post op check. )    Specialty:  Neurosurgery   Contact information:   [place] [address] [phone]       Follow up with Daphene Jaeger, PA-C On 01/09/2014. (@ 2:30 pm)    Specialty:  Physician Assistant   Contact information:   [address] [place] [address] [phone]       Signed: Bary Leriche 01/10/2014, 6:37 PM

## 2014-01-02 NOTE — Progress Notes (Signed)
Speech Language Pathology Session Note & Discharge Summary  Patient Details  Name: Kevin [name] MRN: [id number] Date of Birth: January 12, 1941  Today's Date: 01/02/2014 SLP Individual Time: 1100-1120 SLP Individual Time Calculation (min): 20 min   Skilled Therapeutic Interventions:  Skilled treatment session focused on patient/family education with the patient's son. Patient educated on need for 24 hour supervision to ensure safety with functional tasks and that the patient will require assistance with medication management to ensure accuracy. Patient's son verbalized understanding. He also reported he feels that the patient is at his cognitive baseline and feels that SLP f/u is not needed at this time. CSW made aware. Patient will discharge home today with 24 hour supervision from family.   Patient has met 7 of 7 long term goals.  Patient to discharge at overall Supervision;Min level.   Reasons goals not met: N/A   Clinical Impression/Discharge Summary: Patient has made functional gains and has met 7 of 7 LTG's this admission due to increased swallowing function, orientation, attention, initiation, problem solving, awareness, working memory and safety. Currently, patient is demonstrating behaviors consistent with a Rancho Level VIII and requires Min-Supervision to perform basic and familiar tasks safely. Patient requires increased assistance with more complex tasks such as medication management. Patient is also consuming regular textures with thin liquids without overt s/s of aspiration and is Mod I for utilization of swallowing compensatory strategies.  Patient/family education complete and both the patient and the patient's son report that they feel the patient is at his cognitive baseline, therefore, f/u SLP services is not warranted at this time. Patient will discharge home with 24 hour supervision from family.   Care Partner:  Caregiver Able to Provide Assistance: Yes  Type of Caregiver  Assistance: Physical;Cognitive  Recommendation:  24 hour supervision/assistance      Equipment: N/A   Reasons for discharge: Treatment goals met;Discharged from hospital   Patient/Family Agrees with Progress Made and Goals Achieved: Yes   See FIM for current functional status  [name] 01/02/2014, 6:47 PM

## 2014-01-02 NOTE — Progress Notes (Signed)
Patient and caregiver received discharge instructions from Algis Liming, PA-C with verbal understanding. Patient discharged to home with caregiver and belongings.

## 2014-01-02 NOTE — Discharge Instructions (Signed)
Inpatient Rehab Discharge Instructions  [facility] Discharge date and time:  01/02/14  Activities/Precautions/ Functional Status: Activity: activity as tolerated Diet: cardiac diet Wound Care: keep wound clean and dry Functional status:  ___ No restrictions     ___ Walk up steps independently _X__ 24/7 supervision/assistance   ___ Walk up steps with assistance ___ Intermittent supervision/assistance  ___ Bathe/dress independently ___ Walk with walker     _X__ Bathe/dress with assistance ___ Walk Independently    ___ Shower independently ___ Walk with assistance    ___ Shower with assistance _X__ No alcohol     ___ Return to work/school ________    COMMUNITY REFERRALS UPON DISCHARGE:    Home Health:   PT     OT     RN                     Agency: [facility] Phone: [phone]     Special Instructions: 1. Needs assistance with medication management.  2. Drink plenty of water daily.  3. Check weights daily. Contact cardiology if weight starts going up, if you develop shortness of breath or  notice increase in swelling of extremities.    My questions have been answered and I understand these instructions. I will adhere to these goals and the provided educational materials after my discharge from the hospital.  Patient/Caregiver Signature _______________________________ Date __________  Clinician Signature _______________________________________ Date __________  Please bring this form and your medication list with you to all your follow-up doctor's appointments.

## 2014-01-02 NOTE — Progress Notes (Signed)
Occupational Therapy Discharge Summary  Patient Details  Name: Kevin [name] MRN: [id number] Date of Birth: 07/24/1940  Today's Date: 01/02/2014 OT Individual Time: 1000-1035 OT Individual Time Calculation (min): 35 min    Patient has met 11 of 11 long term goals due to improved activity tolerance, improved balance, postural control, ability to compensate for deficits, improved attention, improved awareness and improved coordination.  Patient to discharge at overall min-supervision level.  Patient's care partner is independent to provide the necessary physical and cognitive assistance at discharge. Patient's son participated in hands on caregiver training and able to return demonstration to safely assist patient with functional transfers. Patient's son has been a caregiver for some time and has previously gone through caregiver training.   Reasons goals not met: N/A. All LTGs met.  Recommendation:  Patient will benefit from ongoing skilled OT services in home health setting to continue to advance functional skills in the area of BADL, activity tolerance, postural control, strength.   Equipment: No equipment provided. Patient has all equipment required.   Reasons for discharge: treatment goals met and discharge from hospital  Patient/family agrees with progress made and goals achieved: Yes  Skilled Therapeutic Intervention Pt seen for ADL retraining with focus on family education/training, functional transfers, bed mobility, and activity tolerance. Pt received sitting in w/c with son present. Son demonstrated carryover from PT session of SBT w/c>bed with min cues. Pt completed bed mobility sit<>supine at supervision level. Son returned demonstration of SBT bed>w/c without cues. Practiced SBT w/c<>BSC with pt completing with therapist at [facility] for management of SB. Son then returned demonstration. Completed lateral leans for clothing management sitting on toilet with steadying  assist. Discussed pt directing care and communication between son and pt with all transfers and self-care activities. Pt and son with no questions at this time as son stated "we went through this training a year ago." Pt left sitting in w/c with all needs in reach.    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 stopped using prosthesis and began using wheelchair as primary mod of mobility due to pain in shoulders while using RW General   Vital Signs   Pain Pain Assessment Pain Assessment: No/denies pain Pain Score: 8  Pain Type: Acute pain Pain Location: Leg Pain Orientation: Left Pain Descriptors / Indicators: Aching Pain Frequency: Intermittent Pain Onset: With Activity Patients Stated Pain Goal: 3 Pain Intervention(s): Medication (See eMAR) ADL   Vision/Perception  Vision- History Baseline Vision/History: No visual deficits Patient Visual Report: No change from baseline Vision- Assessment Vision Assessment?: No apparent visual deficits Eye Alignment: Within Functional Limits  Cognition Overall Cognitive Status: Impaired/Different from baseline Orientation Level: Oriented X4 Attention: Sustained;Selective Focused Attention: Appears intact Sustained Attention: Appears intact Selective Attention: Appears intact Memory: Impaired Memory Impairment: Decreased recall of new information Awareness: Impaired Awareness Impairment: Anticipatory impairment Problem Solving: Impaired Problem Solving Impairment: Functional complex Safety/Judgment: Impaired Sensation Sensation Light Touch: Appears Intact Proprioception: Appears Intact Coordination Gross Motor Movements are Fluid and  Coordinated: Yes Fine Motor Movements are Fluid and Coordinated: Yes Motor  Motor Motor: Within Functional Limits Mobility  Bed Mobility Bed Mobility: Supine to Sit;Sit to Supine Supine to Sit: 5: Supervision Supine to Sit Details: Verbal cues for sequencing Sit to Supine: 5: Supervision Sit to Supine - Details: Verbal  cues for sequencing  Trunk/Postural Assessment  Cervical Assessment Cervical Assessment: Within Functional Limits Thoracic Assessment Thoracic Assessment: Within Functional Limits Lumbar Assessment Lumbar Assessment: Exceptions to [facility] Lumbar Strength Overall Lumbar Strength Comments: posterior pelvic tikt  Balance Balance Balance Assessed: Yes Static Sitting Balance Static Sitting - Balance Support: Bilateral upper extremity supported Static Sitting - Level of Assistance: 6: Modified independent (Device/Increase time) Dynamic Sitting Balance Dynamic Sitting - Balance Activities: Lateral lean/weight shifting Sitting balance - Comments: supervision-steadying assist dynamic sitting balance during LB self-care (lateral leans) Extremity/Trunk Assessment RUE Assessment RUE Assessment: Within Functional Limits LUE Assessment LUE Assessment: Within Functional Limits  See FIM for current functional status  Perkinson, Kayla N 01/02/2014, 10:40 AM

## 2014-01-02 NOTE — Progress Notes (Signed)
Physical Therapy Session Note  Patient Details  Name: Kevin [name] MRN: [id number] Date of Birth: 1940-07-15  Today's Date: 01/02/2014 PT Individual Time: 0900-0945 PT Individual Time Calculation (min): 45 min   Short Term Goals: Week 3:  PT Short Term Goal 1 (Week 3): STGs=LTGs  Skilled Therapeutic Interventions/Progress Updates:    Pt received supine in bed, agreeable to participate in therapy. Pt donned shorts while supine in bed w/ SBA to roll L/R and to pull shorts over bottom. Pt's son arrived for family training. Son guarded pt for sliding board transfer to/from bed and provided safe and effective assistance. Pt performed car transfer w/ son guarding effectively. Pt's son reports he bumps his father up and down 3 stair entry, able to teach back proper technique to therapist. All questions answered and concerns addressed. Pt left seated in w/c w/ son present and all needs within reach.   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 stopped using prosthesis and began using wheelchair as primary mod of mobility due to pain in shoulders while using RW General:   Vital Signs: Therapy Vitals Temp: 98.1 F (36.7 C) Temp Source: Oral Pulse Rate: 69 Resp: 18 BP: 114/70 mmHg Oxygen Therapy SpO2: 100 % O2 Device: None (Room air) Pain:   Mobility:   Locomotion :    Trunk/Postural Assessment :    Balance:   Exercises:   Other Treatments:    See FIM for current functional status  Therapy/Group: Individual Therapy  Rada Hay Rada Hay, PT, DPT 01/02/2014, 7:49 AM

## 2014-01-02 NOTE — Progress Notes (Signed)
[facility]ICINE & RE[facility]ABILITATION     PROGRESS NOTE    Subjective/Complaints: Complaining of headaches at times--.    Review of Systems - limited by cognition   Objective: Vital Signs: Blood pressure 114/70, pulse 69, temperature 98.1 F (36.7 C), temperature source Oral, resp. rate 18, height 5\' 7"  (1.702 m), weight 94 kg (207 lb 3.7 oz), SpO2 100.00%. No results found. No results found for this basename: WBC, HGB, HCT, PLT,  in the last 72 hours No results found for this basename: NA, K, CL, CO, GLUCOSE, BUN, CREATININE, CALCIUM,  in the last 72 hours CBG (last 3)  No results found for this basename: GLUCAP,  in the last 72 hours  Wt Readings from Last 3 Encounters:  01/01/14 94 kg (207 lb 3.7 oz)  12/13/13 93 kg (205 lb 0.4 oz)  12/13/13 93 kg (205 lb 0.4 oz)    Physical Exam:  HENT: dentition fair Left curvilinear craniotomy site intact    Eyes:  Pupils reactive to light. anicteric  Neck: Neck supple. No thyromegaly present.  Cardiovascular: Regular rhythm. HR 75-80  Respiratory: Effort normal and breath sounds normal. No respiratory distress. No rales  GI: Soft. Bowel sounds are normal. He exhibits no distension.  Musculoskeletal:   . RLE with healing multiple old healed abrasions on shin and knee. Neurological:  Flat, but  alert. Makes eye contact with cueing. Can follow simple commands.  Pupils reactive. RUE and RLE   3/5. Oriented to place, reason he's here Skin:  Left AKA site is well-healed  Psych: flat, cooperative   Assessment/Plan: 1. 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 Physiatrist is providing close team supervision and 24 hour management of active medical problems listed below. Physiatrist and rehab team continue to assess barriers to discharge/monitor patient progress toward functional and medical  goals.  Participation remains an issue. Pt's insight is poor---review in team conference today,.   FIM: FIM - Bathing Bathing Steps Patient Completed: Chest;Abdomen;Left Arm;Right Arm;Front perineal area;Right upper leg;Left upper leg;Right lower leg (including foot) Bathing: 5: Supervision: Safety issues/verbal cues  FIM - Upper Body Dressing/Undressing Upper body dressing/undressing steps patient completed: Thread/unthread right sleeve of pullover shirt/dresss;Thread/unthread left sleeve of pullover shirt/dress;Put head through opening of pull over shirt/dress;Pull shirt over trunk Upper body dressing/undressing: 5: Set-up assist to: Obtain clothing/put away FIM - Lower Body Dressing/Undressing Lower body dressing/undressing steps patient completed: Thread/unthread left underwear leg;Thread/unthread right underwear leg;Don/Doff right sock;Pull underwear up/down;Pull pants up/down;Thread/unthread right pants leg;Thread/unthread left pants leg Lower body dressing/undressing: 5: Set-up assist to: Obtain clothing  FIM - Toileting Toileting steps completed by patient: Adjust clothing prior to toileting;Performs perineal hygiene;Adjust clothing after toileting Toileting: 4: Steadying assist  FIM - Radio producer Devices: Research officer, trade union Transfers: 4-To toilet/BSC: Min A (steadying Pt. > 75%);4-From toilet/BSC: Min A (steadying Pt. > 75%)  FIM - Control and instrumentation engineer Devices: Sliding board;Arm rests Bed/Chair Transfer: 5: Supine > Sit: Supervision (verbal cues/safety issues);5: Sit > Supine: Supervision (verbal cues/safety issues);4: Bed > Chair or W/C: Min A (steadying Pt. > 75%);4: Chair or W/C > Bed: Min A (steadying Pt. > 75%)  FIM - Locomotion: Wheelchair Distance: 150 Locomotion: Wheelchair: 6: Travels 150 ft or more, turns around, maneuvers to table, bed or toilet, negotiates 3% grade: maneuvers on rugs and  over door sills independently FIM - Locomotion: Ambulation Ambulation/Gait Assistance: Not  tested (comment) Locomotion: Ambulation: 0: Activity did not occur  Comprehension Comprehension Mode: Auditory Comprehension: 5-Follows basic conversation/direction: With no assist  Expression Expression Mode: Verbal Expression: 5-Expresses basic needs/ideas: With no assist  Social Interaction Social Interaction: 6-Interacts appropriately with others with medication or extra time (anti-anxiety, antidepressant).  Problem Solving Problem Solving: 5-Solves basic problems: With no assist  Memory Memory: 4-Recognizes or recalls 75 - 89% of the time/requires cueing 10 - 24% of the time  Medical Problem List and Plan:  1. Functional deficits secondary to L frontoparietal SDH past falls in setting of anticoagulation.  2. DVT Prophylaxis/Anticoagulation: Mechanical: Antiembolism stockings, thigh (TED hose) Bilateral lower extremities  Sequential compression devices, below knee. Has IVC filter 3. Chronic pain?/ Headaches: percocet prn---need to distract 4. Mood:   ? valium for anxiety at home. LCSW to follow for evaluation and supportive  5. Neuropsych: This patient is not capable of making decisions on his own behalf.  6. Skin/Wound Care: Routine pressure relief measures. Monitor for signs of breakdown.  7. Fluids/Electrolytes/Nutrition: Monitor I/O for adequate intake.   -labs, renal function near baseline   8. CAD with NISCM:Off Xarelto due to falls.   daily weights   Low salt diet.   Resumed Lipitor.    -need apical recording of HR---HR improved    -EP consult recs noted--consvt care   -off b-blocker per cards, avoiding diuretics for now due to volume depletion  -weights trending back up---need to keep an eye on volume status (consider resuming diuretic) 9. Seizure prophylaxis: Continue Keppra bid.  10. Hyponatremia: likely central, may some volume depletion  -sodium currently 130-133 11.   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  Hx R DVT IVC filter 2013    13.  Bowel incontinence improved off Senokot  LOS (Days) 20 A FACE TO FACE EVALUATION WAS PERFORMED  Kevin [name] T 01/02/2014 8:10 AM

## 2014-01-02 NOTE — Progress Notes (Signed)
Social Work  Discharge Note  The overall goal for the admission was met for:   Discharge location: Yes - home with son providing 24/7 assistance  Length of Stay: Yes - 20 days  Discharge activity level: Yes - minimal assist at w/c level  Home/community participation: Yes  Services provided included: MD, RD, PT, OT, ST, RN, TR, Pharmacy, [facility]: Medicare and Medicaid  Follow-up services arranged: Home Health: RN, PT, OT via [facility] and Patient/Family has no preference for HH/DME agencies  Comments (or additional information):  Patient/Family verbalized understanding of follow-up arrangements: Yes  Individual responsible for coordination of the follow-up plan: patient  Confirmed correct DME delivered: NA - pt had all needed DME  Kevin [name]

## 2014-01-02 NOTE — Progress Notes (Signed)
Social Work Patient ID: [name], male   DOB: Jul 15, 1940, 73 y.o.   MRN: [id number]   Lennart Pall, LCSW Social Worker Signed  Patient Care Conference Service date: 01/02/2014 10:25 AM  Inpatient RehabilitationTeam Conference and Plan of Care Update Date: 01/01/2014   Time: 3:05 PM     Patient Name: Kevin [name]       Medical Record Number: [id number]   Date of Birth: 09/23/1940 Sex: Male         Room/Bed: 4W11C/4W11C-01 Payor Info: Payor: MEDICARE / Plan: MEDICARE PART A AND B / Product Type: *No Product type* /   Admitting Diagnosis: traumatic sdh   Admit Date/Time:  12/13/2013  2:59 PM Admission Comments: No comment available   Primary Diagnosis:  <principal problem not specified> Principal Problem: <principal problem not specified>    Patient Active Problem List     Diagnosis  Date Noted   .  Systolic dysfunction  08/65/7846   .  Syncope  12/14/2013   .  Traumatic subdural hemorrhage  12/13/2013   .  Ventricular bigeminy  12/12/2013   .  Subdural hematoma  12/05/2013   .  Chronic subdural hematoma  12/05/2013   .  Subdural hematoma without coma  12/04/2013   .  Amputee, above knee (left)  01/11/2012   .  Renal failure  01/11/2012   .  Leucocytosis  01/11/2012   .  DVT, femoral, acute  01/11/2012   .  DVT of axillary vein, acute  01/04/2012   .  Acute pulmonary embolism  01/04/2012   .  Renal mass, right  01/03/2012   .  Severe sepsis(995.92)  01/02/2012   .  Septic shock(785.52)  01/02/2012   .  Hypotension  01/02/2012   .  Right ventricular failure  01/02/2012   .  Ischemic leg  12/31/2011   .  HTN (hypertension)  12/31/2011   .  CAD (coronary artery disease)  12/31/2011   .  CHF (congestive heart failure)  12/31/2011   .  Tobacco abuse  12/31/2011   .  Hyperlipidemia  12/31/2011     Expected Discharge Date: Expected Discharge Date: 01/02/14  Team Members Present: Physician leading conference: Dr. Alger Simons Social Worker Present: Lennart Pall,  LCSW Nurse Present: Elliot Cousin, RN PT Present: Melene Plan, Cottie Banda, PT OT Present: Willeen Cass, Roland Earl, OT;Roanna Epley, COTA SLP Present: Weston Anna, SLP PPS Coordinator present : Daiva Nakayama, RN, CRRN        Current Status/Progress  Goal  Weekly Team Focus   Medical     improved therapy participation, making an effort  see prior  finalize dc planning   Bowel/Bladder     Pt cont of  bladder using urinal and no BM since 10/18; occ requires need for supp  cont of B+B   monitor need for laxative or supp   Swallow/Nutrition/ Hydration     Regular textures with thin liquids, Intermittent Supervision  Supervision  Family education    ADL's     min-mod assist bathing and dressing from bed level; mod A SBT  supervision overall with mod assist toilet transfer and min assist toileting  sitting balance, activity tolerance, transfers, awareness   Mobility     minA overall  goals downgraded to minA overall due to poor participation and high refusal rate  participation, transfers, bed mobility, sitting balance, wheelchair mobility, set up for transfers, education, safety   Communication  Safety/Cognition/ Behavioral Observations    Supervision-Min A  supervision for orientation, min A for problem solving, memory, attention and awareness   Family Education    Pain     HA managed with ultram and tylenol; soreness in lft hip managed with prn tylenol  Pain at or below 4  assess effectiveness of medicationa and need for prn   Skin     abrasion rt shin-tegaderm; incision healing  no skin integrity breakdown  assess skin  q shift    Rehab Goals Patient on target to meet rehab goals: Yes Rehab Goals Revised: better participation past couple of days *See Care Plan and progress notes for long and short-term goals.    Barriers to Discharge:  see prior      Possible Resolutions to Barriers:    family ed      Discharge Planning/Teaching Needs:    Plan  home with son tomorrow after family education completed      Team Discussion:    Some better participation this week and reaching min assist goals.  Family education with son tomorrow and d/c following.   Revisions to Treatment Plan:    None    Continued Need for Acute Rehabilitation Level of Care: The patient requires daily medical management by a physician with specialized training in physical medicine and rehabilitation for the following conditions: Daily direction of a multidisciplinary physical rehabilitation program to ensure safe treatment while eliciting the highest outcome that is of practical value to the patient.: Yes Daily medical management of patient stability for increased activity during participation in an intensive rehabilitation regime.: Yes Daily analysis of laboratory values and/or radiology reports with any subsequent need for medication adjustment of medical intervention for : Post surgical problems;Neurological problems  Heraclio Seidman 01/02/2014, 10:25 AM         Lennart Pall, LCSW Social Worker Signed  Patient Care Conference Service date: 12/25/2013 6:05 PM  Inpatient RehabilitationTeam Conference and Plan of Care Update Date: 12/25/2013   Time: 3:10 PM     Patient Name: Kevin [name]       Medical Record Number: [id number]   Date of Birth: [birth year] Sex: Male         Room/Bed: 4W11C/4W11C-01 Payor Info: Payor: MEDICARE / Plan: MEDICARE PART A AND B / Product Type: *No Product type* /   Admitting Diagnosis: traumatic sdh   Admit Date/Time:  12/13/2013  2:59 PM Admission Comments: No comment available   Primary Diagnosis:  <principal problem not specified> Principal Problem: <principal problem not specified>    Patient Active Problem List     Diagnosis  Date Noted   .  Systolic dysfunction  02/77/4128   .  Syncope  12/14/2013   .  Traumatic subdural hemorrhage  12/13/2013   .  Ventricular bigeminy  12/12/2013   .  Subdural hematoma  12/05/2013   .   Chronic subdural hematoma  12/05/2013   .  Subdural hematoma without coma  12/04/2013   .  Amputee, above knee (left)  01/11/2012   .  Renal failure  01/11/2012   .  Leucocytosis  01/11/2012   .  DVT, femoral, acute  01/11/2012   .  DVT of axillary vein, acute  01/04/2012   .  Acute pulmonary embolism  01/04/2012   .  Renal mass, right  01/03/2012   .  Severe sepsis(995.92)  01/02/2012   .  Septic shock(785.52)  01/02/2012   .  Hypotension  01/02/2012   .  Right ventricular failure  01/02/2012   .  Ischemic leg  12/31/2011   .  HTN (hypertension)  12/31/2011   .  CAD (coronary artery disease)  12/31/2011   .  CHF (congestive heart failure)  12/31/2011   .  Tobacco abuse  12/31/2011   .  Hyperlipidemia  12/31/2011     Expected Discharge Date: Expected Discharge Date: 01/02/14 (vs SNF)  Team Members Present: Physician leading conference: Dr. Alger Simons Social Worker Present: Lennart Pall, LCSW Nurse Present: Elliot Cousin, RN PT Present: Raylene Everts, PT;Bridgett Ripa, Cottie Banda, PT OT Present: Willeen Cass, Roland Earl, OT;Roanna Epley, COTA SLP Present: Other (comment) Venita Sheffield., ST) PPS Coordinator present : Daiva Nakayama, RN, CRRN        Current Status/Progress  Goal  Weekly Team Focus   Medical     poor participation and activity tolerance. headaches  improve interactions, see prior  pain control, motivational   Bowel/Bladder     Pt continent of bowel and bladder  remain continent manage b/b minimal assist  encoruage use of BSC instead bedpan    Swallow/Nutrition/ Hydration     Dys 3 textures and thin liquids, Min cues  Supervision  utilization of small bites/sips, trials of regular textures   ADL's     min-mod assist bathing and dressing from bed level (+2 assist for sit<>stand); max assist SBT to/from toilet, total assist toileting  supervision overall with mod assist toilet transfer and min assist toileting  sitting balance, activity tolerance,  functional transfers, attention, awareness   Mobility            Communication            Safety/Cognition/ Behavioral Observations    Mod-Max A  supervision for orientation, min A for problem solving, memory, attention and awareness   sustained attention, emergent awareness, problem solving, memory   Pain     pt complains of headache at times. 1 percocet prn q6 hrs   pain managed at 4 or less  assess pain and medicate as needed.   Skin     abrasion to right shin-tegaderm in place. Incision approximated to scalp.  Skin remains free of breakdown with minimal assist  assess skin qshift. moniotr for s/s of infection    Rehab Goals Patient on target to meet rehab goals: No Rehab Goals Revised: very poor participation and most goals likely to be downgraded *See Care Plan and progress notes for long and short-term goals.    Barriers to Discharge:  chronic, dependent behaviors      Possible Resolutions to Barriers:    see prior, ?SNF      Discharge Planning/Teaching Needs:    Original plan was for pt to d/c home with son providing 24/7 assist.  Have spoken with son about pt's poor participation and he is in agreement that if this does not improve then plan will need to change to SNF.      Team Discussion:    No progress this week and very poor participation of pt.  At times actually turns eyes and head away.  Currently requiring total assist +2 at times.  Son aware of pt's lack of progress with plans to speak with pt this evening.  SW to follow up with them both tomorrow.  SW notes both aware of the option of SNF if no changes to pt's level of participation.   Revisions to Treatment Plan:    Plan to change schedule to qd  Continued Need for Acute Rehabilitation Level of Care: The patient requires daily medical management by a physician with specialized training in physical medicine and rehabilitation for the following conditions: Daily direction of a multidisciplinary physical  rehabilitation program to ensure safe treatment while eliciting the highest outcome that is of practical value to the patient.: Yes Daily medical management of patient stability for increased activity during participation in an intensive rehabilitation regime.: Yes Daily analysis of laboratory values and/or radiology reports with any subsequent need for medication adjustment of medical intervention for : Other;Post surgical problems;Neurological problems  Keiland Pickering 12/25/2013, 6:05 PM         Lennart Pall, LCSW Social Worker Signed  Patient Care Conference Service date: 12/18/2013 3:38 PM  Inpatient RehabilitationTeam Conference and Plan of Care Update Date: 12/18/2013   Time: 3:15 PM     Patient Name: Kevin [name]       Medical Record Number: [id number]   Date of Birth: 28-Oct-1940 Sex: Male         Room/Bed: 4W11C/4W11C-01 Payor Info: Payor: MEDICARE / Plan: MEDICARE PART A AND B / Product Type: *No Product type* /   Admitting Diagnosis: traumatic sdh   Admit Date/Time:  12/13/2013  2:59 PM Admission Comments: No comment available   Primary Diagnosis:  <principal problem not specified> Principal Problem: <principal problem not specified>    Patient Active Problem List     Diagnosis  Date Noted   .  Systolic dysfunction  72/09/2180   .  Syncope  12/14/2013   .  Traumatic subdural hemorrhage  12/13/2013   .  Ventricular bigeminy  12/12/2013   .  Subdural hematoma  12/05/2013   .  Chronic subdural hematoma  12/05/2013   .  Subdural hematoma without coma  12/04/2013   .  Amputee, above knee (left)  01/11/2012   .  Renal failure  01/11/2012   .  Leucocytosis  01/11/2012   .  DVT, femoral, acute  01/11/2012   .  DVT of axillary vein, acute  01/04/2012   .  Acute pulmonary embolism  01/04/2012   .  Renal mass, right  01/03/2012   .  Severe sepsis(995.92)  01/02/2012   .  Septic shock(785.52)  01/02/2012   .  Hypotension  01/02/2012   .  Right ventricular failure  01/02/2012    .  Ischemic leg  12/31/2011   .  HTN (hypertension)  12/31/2011   .  CAD (coronary artery disease)  12/31/2011   .  CHF (congestive heart failure)  12/31/2011   .  Tobacco abuse  12/31/2011   .  Hyperlipidemia  12/31/2011     Expected Discharge Date: Expected Discharge Date: 01/02/14  Team Members Present: Physician leading conference: Dr. Alger Simons Social Worker Present: Lennart Pall, LCSW Nurse Present: Rozetta Nunnery, RN PT Present: Melene Plan, Cottie Banda, PT OT Present: Roanna Epley, [place] , OT;Kayla [place], OT SLP Present: Weston Anna, SLP PPS Coordinator present : Daiva Nakayama, RN, CRRN        Current Status/Progress  Goal  Weekly Team Focus   Medical     left AKA, left SDH, cardiomyopathy  improve activity tolerance  pain control, bp/cv mgt   Bowel/Bladder     continent bowel/bladder-uses urinal  remain continent  offer urinal , toilet assistance as needed   Swallow/Nutrition/ Hydration     Dys. 3 textures with thin liquids, Min A   Supervision  utilization of small bites and sips   ADL's  min A UB dressing, total assist LB dressing, mod assist bathing, S sitting balance with UE supported, min-max sitting balance w/o UE supported  supervision overall and min assist tub and toilet transfers  sitting balance, functional transfers, activity tolerance, attention, awareness, strengthening, sit<>stand   Mobility     modA overall for bed mobility, supervision sitting balance; has not performed transfer yet due to activity tolerance/vitals  supervision overall  education, safety, activity tolerance, functional transfers, wheelchair mobility, balance, strengthening   Communication            Safety/Cognition/ Behavioral Observations    Mod-Max A   supervision for orientation, min A for problem solving, memory, attention and awareness   sustained attention, emergent awareness, problem solving, memory   Pain     7-8 out of 10 headache  less than 3 out  of 10  offer pain medication as needed- percocet added   Skin     Skin CDI  Skin remains free of breakdown  continue to assess daily    Rehab Goals Patient on target to meet rehab goals: Yes *See Care Plan and progress notes for long and short-term goals.    Barriers to Discharge:  fatigue, pain      Possible Resolutions to Barriers:    pain mgt, family ed, supervison/min assist goals at w/c level      Discharge Planning/Teaching Needs:    home with son to provide 24/7 assistance      Team Discussion:    Better today overall which could be due to increase in pain meds.  Trial of sliding board tfs and it went well.  Supervision to min assist w/c level goals.  Not sure how he was entering home PTA as there is no ramp present and pt was primarily w/c level (did not use prosthesis).     Revisions to Treatment Plan:    None    Continued Need for Acute Rehabilitation Level of Care: The patient requires daily medical management by a physician with specialized training in physical medicine and rehabilitation for the following conditions: Daily direction of a multidisciplinary physical rehabilitation program to ensure safe treatment while eliciting the highest outcome that is of practical value to the patient.: Yes Daily medical management of patient stability for increased activity during participation in an intensive rehabilitation regime.: Yes Daily analysis of laboratory values and/or radiology reports with any subsequent need for medication adjustment of medical intervention for : Post surgical problems;Neurological problems  Sukhman Kocher 12/18/2013, 3:38 PM

## 2014-01-03 NOTE — Progress Notes (Signed)
Physical Therapy Discharge Summary  Patient Details  Name: Kevin [name] MRN: [id number] Date of Birth: [birth year]  Today's Date: 01/03/2014  Patient has met 10 of 10 long term goals due to improved activity tolerance, improved balance, improved postural control, increased strength, decreased pain, ability to compensate for deficits, functional use of  right upper extremity, right lower extremity and left upper extremity, improved attention, improved awareness and improved coordination.  Patient to discharge at a wheelchair level [place].   Patient's care partner is independent to provide the necessary physical and cognitive assistance at discharge.  Reasons goals not met: N/A, all LTGs met  Recommendation:  Patient will benefit from ongoing skilled PT services in home health setting to continue to advance safe functional mobility, address ongoing impairments in activity tolerance, balance, strength, overall functional mobility, and minimize fall risk.  Equipment: No equipment provided, patient owns all DME.  Reasons for discharge: treatment goals met and discharge from hospital  Patient/family agrees with progress made and goals achieved: Yes  Please see PT treatment note from 10/21 for details about patient's CLOF. All information below based on patient performance during PT session on 10/21.  PT Discharge Precautions/Restrictions Precautions Precautions: Fall Precaution Comments: Pt with Lt AKA; does not wear prosthesis, burr hole evacuation Required Braces or Orthoses: Other Brace/Splint Other Brace/Splint: prosthesis Restrictions Weight Bearing Restrictions: No Other Position/Activity Restrictions: Son reports pt. stopped using prosthesis and began using wheelchair as primary mod of mobility due to pain in shoulders while using RW Vision/Perception  Vision - Assessment Eye Alignment: Within Functional Limits  Cognition Overall Cognitive Status: Impaired/Different from  baseline Arousal/Alertness: Awake/alert Orientation Level: Oriented X4 Attention: Sustained;Selective Focused Attention: Appears intact Sustained Attention: Appears intact Selective Attention: Appears intact Memory: Impaired Memory Impairment: Decreased recall of new information Awareness: Impaired Awareness Impairment: Anticipatory impairment Problem Solving: Impaired Problem Solving Impairment: Functional complex Safety/Judgment: Impaired [employer] Scales of Cognitive Functioning: Purposeful/appropriate Sensation Sensation Light Touch: Appears Intact Proprioception: Appears Intact Coordination Gross Motor Movements are Fluid and Coordinated: Yes Fine Motor Movements are Fluid and Coordinated: Yes Motor  Motor Motor: Within Functional Limits  Locomotion  Ambulation Ambulation: No Ambulation/Gait Assistance: Not tested (comment) Gait Gait: No Stairs / Additional Locomotion Stairs: No Wheelchair Mobility Wheelchair Mobility: Yes Wheelchair Assistance: 6: Modified independent (Device/Increase time) Environmental health practitioner: Both upper extremities Wheelchair Parts Management: Independent Distance: 150  Trunk/Postural Assessment  Cervical Assessment Cervical Assessment: Within Functional Limits Thoracic Assessment Thoracic Assessment: Within Functional Limits Lumbar Assessment Lumbar Assessment: Exceptions to [facility] Lumbar Strength Overall Lumbar Strength Comments: posterior pelvic tilt Postural Control Postural Control: Deficits on evaluation Protective Responses: delayed Postural Limitations: Pt tends to lean far to the R when sitting at EOB.   Balance Balance Balance Assessed: Yes Static Sitting Balance Static Sitting - Balance Support: Bilateral upper extremity supported Static Sitting - Level of Assistance: 6: Modified independent (Device/Increase time) Dynamic Sitting Balance Dynamic Sitting - Balance Activities: Lateral lean/weight shifting Extremity  Assessment  RLE Assessment RLE Assessment: Exceptions to [employer] RLE Strength Right Hip Flexion: 3-/5 Right Hip Extension: 2+/5 Right Knee Flexion: 3/5 Right Knee Extension: 3+/5 Right Ankle Dorsiflexion: 4/5 Right Ankle Plantar Flexion: 3+/5 LLE Assessment LLE Assessment: Exceptions to WFL LLE Strength LLE Overall Strength Comments: hip AROM WFL, strength at least 3/5  See FIM for current functional status  Harvel Meskill S Jayvier Burgher S. Selen Smucker, PT, DPT 01/03/2014, 7:58 AM

## 2014-01-04 DIAGNOSIS — I10 Essential (primary) hypertension: Secondary | ICD-10-CM | POA: Diagnosis not present

## 2014-01-04 DIAGNOSIS — S065X9S Traumatic subdural hemorrhage with loss of consciousness of unspecified duration, sequela: Secondary | ICD-10-CM | POA: Diagnosis not present

## 2014-01-04 DIAGNOSIS — I5030 Unspecified diastolic (congestive) heart failure: Secondary | ICD-10-CM | POA: Diagnosis not present

## 2014-01-04 DIAGNOSIS — R531 Weakness: Secondary | ICD-10-CM | POA: Diagnosis not present

## 2014-01-04 DIAGNOSIS — M1990 Unspecified osteoarthritis, unspecified site: Secondary | ICD-10-CM | POA: Diagnosis not present

## 2014-01-04 DIAGNOSIS — I2510 Atherosclerotic heart disease of native coronary artery without angina pectoris: Secondary | ICD-10-CM | POA: Diagnosis not present

## 2014-01-07 ENCOUNTER — Encounter: Payer: Medicare Other | Admitting: Cardiovascular Disease

## 2014-01-07 NOTE — Progress Notes (Signed)
This encounter was created in error - please disregard.

## 2014-01-08 ENCOUNTER — Encounter: Payer: Self-pay | Admitting: Cardiovascular Disease

## 2014-01-08 ENCOUNTER — Telehealth: Payer: Self-pay | Admitting: *Deleted

## 2014-01-08 DIAGNOSIS — M1990 Unspecified osteoarthritis, unspecified site: Secondary | ICD-10-CM | POA: Diagnosis not present

## 2014-01-08 DIAGNOSIS — I10 Essential (primary) hypertension: Secondary | ICD-10-CM | POA: Diagnosis not present

## 2014-01-08 DIAGNOSIS — I2510 Atherosclerotic heart disease of native coronary artery without angina pectoris: Secondary | ICD-10-CM | POA: Diagnosis not present

## 2014-01-08 DIAGNOSIS — R531 Weakness: Secondary | ICD-10-CM | POA: Diagnosis not present

## 2014-01-08 DIAGNOSIS — S065X9S Traumatic subdural hemorrhage with loss of consciousness of unspecified duration, sequela: Secondary | ICD-10-CM | POA: Diagnosis not present

## 2014-01-08 DIAGNOSIS — I5030 Unspecified diastolic (congestive) heart failure: Secondary | ICD-10-CM | POA: Diagnosis not present

## 2014-01-08 NOTE — Telephone Encounter (Signed)
Dorian called about PT 2 times a week for 8 weeks.

## 2014-01-09 DIAGNOSIS — Z9889 Other specified postprocedural states: Secondary | ICD-10-CM | POA: Diagnosis not present

## 2014-01-09 DIAGNOSIS — G8929 Other chronic pain: Secondary | ICD-10-CM | POA: Diagnosis not present

## 2014-01-09 DIAGNOSIS — F419 Anxiety disorder, unspecified: Secondary | ICD-10-CM | POA: Diagnosis not present

## 2014-01-09 DIAGNOSIS — Z09 Encounter for follow-up examination after completed treatment for conditions other than malignant neoplasm: Secondary | ICD-10-CM | POA: Diagnosis not present

## 2014-01-10 DIAGNOSIS — R55 Syncope and collapse: Secondary | ICD-10-CM | POA: Diagnosis not present

## 2014-01-10 DIAGNOSIS — R001 Bradycardia, unspecified: Secondary | ICD-10-CM | POA: Diagnosis present

## 2014-01-10 DIAGNOSIS — E860 Dehydration: Secondary | ICD-10-CM | POA: Diagnosis present

## 2014-01-14 DIAGNOSIS — M1990 Unspecified osteoarthritis, unspecified site: Secondary | ICD-10-CM | POA: Diagnosis not present

## 2014-01-14 DIAGNOSIS — I5030 Unspecified diastolic (congestive) heart failure: Secondary | ICD-10-CM | POA: Diagnosis not present

## 2014-01-14 DIAGNOSIS — I10 Essential (primary) hypertension: Secondary | ICD-10-CM | POA: Diagnosis not present

## 2014-01-14 DIAGNOSIS — S065X9S Traumatic subdural hemorrhage with loss of consciousness of unspecified duration, sequela: Secondary | ICD-10-CM | POA: Diagnosis not present

## 2014-01-14 DIAGNOSIS — I2510 Atherosclerotic heart disease of native coronary artery without angina pectoris: Secondary | ICD-10-CM | POA: Diagnosis not present

## 2014-01-14 DIAGNOSIS — R531 Weakness: Secondary | ICD-10-CM | POA: Diagnosis not present

## 2014-01-14 NOTE — Telephone Encounter (Signed)
Called and left message on secure phone line

## 2014-01-15 DIAGNOSIS — S065X9S Traumatic subdural hemorrhage with loss of consciousness of unspecified duration, sequela: Secondary | ICD-10-CM | POA: Diagnosis not present

## 2014-01-15 DIAGNOSIS — I10 Essential (primary) hypertension: Secondary | ICD-10-CM | POA: Diagnosis not present

## 2014-01-15 DIAGNOSIS — I2510 Atherosclerotic heart disease of native coronary artery without angina pectoris: Secondary | ICD-10-CM | POA: Diagnosis not present

## 2014-01-15 DIAGNOSIS — R531 Weakness: Secondary | ICD-10-CM | POA: Diagnosis not present

## 2014-01-15 DIAGNOSIS — I5030 Unspecified diastolic (congestive) heart failure: Secondary | ICD-10-CM | POA: Diagnosis not present

## 2014-01-15 DIAGNOSIS — M1990 Unspecified osteoarthritis, unspecified site: Secondary | ICD-10-CM | POA: Diagnosis not present

## 2014-01-16 DIAGNOSIS — M1990 Unspecified osteoarthritis, unspecified site: Secondary | ICD-10-CM | POA: Diagnosis not present

## 2014-01-16 DIAGNOSIS — I2510 Atherosclerotic heart disease of native coronary artery without angina pectoris: Secondary | ICD-10-CM | POA: Diagnosis not present

## 2014-01-16 DIAGNOSIS — I10 Essential (primary) hypertension: Secondary | ICD-10-CM | POA: Diagnosis not present

## 2014-01-16 DIAGNOSIS — S065X9S Traumatic subdural hemorrhage with loss of consciousness of unspecified duration, sequela: Secondary | ICD-10-CM | POA: Diagnosis not present

## 2014-01-16 DIAGNOSIS — I5030 Unspecified diastolic (congestive) heart failure: Secondary | ICD-10-CM | POA: Diagnosis not present

## 2014-01-16 DIAGNOSIS — R531 Weakness: Secondary | ICD-10-CM | POA: Diagnosis not present

## 2014-01-20 DIAGNOSIS — M1990 Unspecified osteoarthritis, unspecified site: Secondary | ICD-10-CM | POA: Diagnosis not present

## 2014-01-20 DIAGNOSIS — I2510 Atherosclerotic heart disease of native coronary artery without angina pectoris: Secondary | ICD-10-CM | POA: Diagnosis not present

## 2014-01-20 DIAGNOSIS — I5030 Unspecified diastolic (congestive) heart failure: Secondary | ICD-10-CM | POA: Diagnosis not present

## 2014-01-20 DIAGNOSIS — I10 Essential (primary) hypertension: Secondary | ICD-10-CM | POA: Diagnosis not present

## 2014-01-20 DIAGNOSIS — R531 Weakness: Secondary | ICD-10-CM | POA: Diagnosis not present

## 2014-01-20 DIAGNOSIS — S065X9S Traumatic subdural hemorrhage with loss of consciousness of unspecified duration, sequela: Secondary | ICD-10-CM | POA: Diagnosis not present

## 2014-01-21 DIAGNOSIS — I10 Essential (primary) hypertension: Secondary | ICD-10-CM | POA: Diagnosis not present

## 2014-01-21 DIAGNOSIS — S065X9S Traumatic subdural hemorrhage with loss of consciousness of unspecified duration, sequela: Secondary | ICD-10-CM | POA: Diagnosis not present

## 2014-01-21 DIAGNOSIS — M1990 Unspecified osteoarthritis, unspecified site: Secondary | ICD-10-CM | POA: Diagnosis not present

## 2014-01-21 DIAGNOSIS — I2510 Atherosclerotic heart disease of native coronary artery without angina pectoris: Secondary | ICD-10-CM | POA: Diagnosis not present

## 2014-01-21 DIAGNOSIS — I5030 Unspecified diastolic (congestive) heart failure: Secondary | ICD-10-CM | POA: Diagnosis not present

## 2014-01-21 DIAGNOSIS — J069 Acute upper respiratory infection, unspecified: Secondary | ICD-10-CM | POA: Diagnosis not present

## 2014-01-21 DIAGNOSIS — R531 Weakness: Secondary | ICD-10-CM | POA: Diagnosis not present

## 2014-01-24 DIAGNOSIS — I2510 Atherosclerotic heart disease of native coronary artery without angina pectoris: Secondary | ICD-10-CM | POA: Diagnosis not present

## 2014-01-24 DIAGNOSIS — I5030 Unspecified diastolic (congestive) heart failure: Secondary | ICD-10-CM | POA: Diagnosis not present

## 2014-01-24 DIAGNOSIS — S065X9S Traumatic subdural hemorrhage with loss of consciousness of unspecified duration, sequela: Secondary | ICD-10-CM | POA: Diagnosis not present

## 2014-01-24 DIAGNOSIS — R531 Weakness: Secondary | ICD-10-CM | POA: Diagnosis not present

## 2014-01-24 DIAGNOSIS — M1990 Unspecified osteoarthritis, unspecified site: Secondary | ICD-10-CM | POA: Diagnosis not present

## 2014-01-24 DIAGNOSIS — I10 Essential (primary) hypertension: Secondary | ICD-10-CM | POA: Diagnosis not present

## 2014-01-25 DIAGNOSIS — I10 Essential (primary) hypertension: Secondary | ICD-10-CM | POA: Diagnosis not present

## 2014-01-25 DIAGNOSIS — M1990 Unspecified osteoarthritis, unspecified site: Secondary | ICD-10-CM | POA: Diagnosis not present

## 2014-01-25 DIAGNOSIS — S065X9S Traumatic subdural hemorrhage with loss of consciousness of unspecified duration, sequela: Secondary | ICD-10-CM | POA: Diagnosis not present

## 2014-01-25 DIAGNOSIS — I2510 Atherosclerotic heart disease of native coronary artery without angina pectoris: Secondary | ICD-10-CM | POA: Diagnosis not present

## 2014-01-25 DIAGNOSIS — I5030 Unspecified diastolic (congestive) heart failure: Secondary | ICD-10-CM | POA: Diagnosis not present

## 2014-01-25 DIAGNOSIS — R531 Weakness: Secondary | ICD-10-CM | POA: Diagnosis not present

## 2014-01-28 ENCOUNTER — Encounter: Payer: Medicare Other | Attending: Physical Medicine & Rehabilitation | Admitting: Physical Medicine & Rehabilitation

## 2014-01-28 DIAGNOSIS — I10 Essential (primary) hypertension: Secondary | ICD-10-CM | POA: Diagnosis not present

## 2014-01-28 DIAGNOSIS — R531 Weakness: Secondary | ICD-10-CM | POA: Diagnosis not present

## 2014-01-28 DIAGNOSIS — I5030 Unspecified diastolic (congestive) heart failure: Secondary | ICD-10-CM | POA: Diagnosis not present

## 2014-01-28 DIAGNOSIS — S065X9S Traumatic subdural hemorrhage with loss of consciousness of unspecified duration, sequela: Secondary | ICD-10-CM | POA: Diagnosis not present

## 2014-01-28 DIAGNOSIS — M1990 Unspecified osteoarthritis, unspecified site: Secondary | ICD-10-CM | POA: Diagnosis not present

## 2014-01-28 DIAGNOSIS — I2510 Atherosclerotic heart disease of native coronary artery without angina pectoris: Secondary | ICD-10-CM | POA: Diagnosis not present

## 2014-01-29 ENCOUNTER — Other Ambulatory Visit: Payer: Self-pay | Admitting: Physical Medicine and Rehabilitation

## 2014-01-30 ENCOUNTER — Other Ambulatory Visit: Payer: Self-pay | Admitting: Neurosurgery

## 2014-01-30 DIAGNOSIS — I5030 Unspecified diastolic (congestive) heart failure: Secondary | ICD-10-CM | POA: Diagnosis not present

## 2014-01-30 DIAGNOSIS — M1990 Unspecified osteoarthritis, unspecified site: Secondary | ICD-10-CM | POA: Diagnosis not present

## 2014-01-30 DIAGNOSIS — I2510 Atherosclerotic heart disease of native coronary artery without angina pectoris: Secondary | ICD-10-CM | POA: Diagnosis not present

## 2014-01-30 DIAGNOSIS — S065X9S Traumatic subdural hemorrhage with loss of consciousness of unspecified duration, sequela: Secondary | ICD-10-CM | POA: Diagnosis not present

## 2014-01-30 DIAGNOSIS — I10 Essential (primary) hypertension: Secondary | ICD-10-CM | POA: Diagnosis not present

## 2014-01-30 DIAGNOSIS — R531 Weakness: Secondary | ICD-10-CM | POA: Diagnosis not present

## 2014-01-30 DIAGNOSIS — S065XAA Traumatic subdural hemorrhage with loss of consciousness status unknown, initial encounter: Secondary | ICD-10-CM

## 2014-01-30 DIAGNOSIS — S065X9A Traumatic subdural hemorrhage with loss of consciousness of unspecified duration, initial encounter: Secondary | ICD-10-CM

## 2014-02-01 DIAGNOSIS — G8929 Other chronic pain: Secondary | ICD-10-CM | POA: Diagnosis not present

## 2014-02-01 DIAGNOSIS — I10 Essential (primary) hypertension: Secondary | ICD-10-CM | POA: Diagnosis not present

## 2014-02-01 DIAGNOSIS — F419 Anxiety disorder, unspecified: Secondary | ICD-10-CM | POA: Diagnosis not present

## 2014-02-02 DIAGNOSIS — I5030 Unspecified diastolic (congestive) heart failure: Secondary | ICD-10-CM | POA: Diagnosis not present

## 2014-02-02 DIAGNOSIS — R531 Weakness: Secondary | ICD-10-CM | POA: Diagnosis not present

## 2014-02-02 DIAGNOSIS — M1990 Unspecified osteoarthritis, unspecified site: Secondary | ICD-10-CM | POA: Diagnosis not present

## 2014-02-02 DIAGNOSIS — I10 Essential (primary) hypertension: Secondary | ICD-10-CM | POA: Diagnosis not present

## 2014-02-02 DIAGNOSIS — I2510 Atherosclerotic heart disease of native coronary artery without angina pectoris: Secondary | ICD-10-CM | POA: Diagnosis not present

## 2014-02-02 DIAGNOSIS — S065X9S Traumatic subdural hemorrhage with loss of consciousness of unspecified duration, sequela: Secondary | ICD-10-CM | POA: Diagnosis not present

## 2014-02-05 DIAGNOSIS — R531 Weakness: Secondary | ICD-10-CM | POA: Diagnosis not present

## 2014-02-05 DIAGNOSIS — M1990 Unspecified osteoarthritis, unspecified site: Secondary | ICD-10-CM | POA: Diagnosis not present

## 2014-02-05 DIAGNOSIS — S065X9S Traumatic subdural hemorrhage with loss of consciousness of unspecified duration, sequela: Secondary | ICD-10-CM | POA: Diagnosis not present

## 2014-02-05 DIAGNOSIS — I2510 Atherosclerotic heart disease of native coronary artery without angina pectoris: Secondary | ICD-10-CM | POA: Diagnosis not present

## 2014-02-05 DIAGNOSIS — I10 Essential (primary) hypertension: Secondary | ICD-10-CM | POA: Diagnosis not present

## 2014-02-05 DIAGNOSIS — I5030 Unspecified diastolic (congestive) heart failure: Secondary | ICD-10-CM | POA: Diagnosis not present

## 2014-02-06 DIAGNOSIS — I10 Essential (primary) hypertension: Secondary | ICD-10-CM | POA: Diagnosis not present

## 2014-02-06 DIAGNOSIS — I5030 Unspecified diastolic (congestive) heart failure: Secondary | ICD-10-CM | POA: Diagnosis not present

## 2014-02-06 DIAGNOSIS — I2510 Atherosclerotic heart disease of native coronary artery without angina pectoris: Secondary | ICD-10-CM | POA: Diagnosis not present

## 2014-02-06 DIAGNOSIS — R531 Weakness: Secondary | ICD-10-CM | POA: Diagnosis not present

## 2014-02-06 DIAGNOSIS — S065X9S Traumatic subdural hemorrhage with loss of consciousness of unspecified duration, sequela: Secondary | ICD-10-CM | POA: Diagnosis not present

## 2014-02-06 DIAGNOSIS — M1990 Unspecified osteoarthritis, unspecified site: Secondary | ICD-10-CM | POA: Diagnosis not present

## 2014-02-12 DIAGNOSIS — I10 Essential (primary) hypertension: Secondary | ICD-10-CM | POA: Diagnosis not present

## 2014-02-12 DIAGNOSIS — R531 Weakness: Secondary | ICD-10-CM | POA: Diagnosis not present

## 2014-02-12 DIAGNOSIS — I2510 Atherosclerotic heart disease of native coronary artery without angina pectoris: Secondary | ICD-10-CM | POA: Diagnosis not present

## 2014-02-12 DIAGNOSIS — M1990 Unspecified osteoarthritis, unspecified site: Secondary | ICD-10-CM | POA: Diagnosis not present

## 2014-02-12 DIAGNOSIS — S065X9S Traumatic subdural hemorrhage with loss of consciousness of unspecified duration, sequela: Secondary | ICD-10-CM | POA: Diagnosis not present

## 2014-02-12 DIAGNOSIS — I5030 Unspecified diastolic (congestive) heart failure: Secondary | ICD-10-CM | POA: Diagnosis not present

## 2014-02-14 ENCOUNTER — Other Ambulatory Visit: Payer: Self-pay | Admitting: Physical Medicine and Rehabilitation

## 2014-02-14 DIAGNOSIS — R531 Weakness: Secondary | ICD-10-CM | POA: Diagnosis not present

## 2014-02-14 DIAGNOSIS — S065X9S Traumatic subdural hemorrhage with loss of consciousness of unspecified duration, sequela: Secondary | ICD-10-CM | POA: Diagnosis not present

## 2014-02-14 DIAGNOSIS — I5030 Unspecified diastolic (congestive) heart failure: Secondary | ICD-10-CM | POA: Diagnosis not present

## 2014-02-14 DIAGNOSIS — I10 Essential (primary) hypertension: Secondary | ICD-10-CM | POA: Diagnosis not present

## 2014-02-14 DIAGNOSIS — M1990 Unspecified osteoarthritis, unspecified site: Secondary | ICD-10-CM | POA: Diagnosis not present

## 2014-02-14 DIAGNOSIS — I2510 Atherosclerotic heart disease of native coronary artery without angina pectoris: Secondary | ICD-10-CM | POA: Diagnosis not present

## 2014-02-19 ENCOUNTER — Other Ambulatory Visit: Payer: Self-pay | Admitting: Physical Medicine and Rehabilitation

## 2014-02-20 DIAGNOSIS — M1990 Unspecified osteoarthritis, unspecified site: Secondary | ICD-10-CM | POA: Diagnosis not present

## 2014-02-20 DIAGNOSIS — R531 Weakness: Secondary | ICD-10-CM | POA: Diagnosis not present

## 2014-02-20 DIAGNOSIS — I10 Essential (primary) hypertension: Secondary | ICD-10-CM | POA: Diagnosis not present

## 2014-02-20 DIAGNOSIS — I2510 Atherosclerotic heart disease of native coronary artery without angina pectoris: Secondary | ICD-10-CM | POA: Diagnosis not present

## 2014-02-20 DIAGNOSIS — S065X9S Traumatic subdural hemorrhage with loss of consciousness of unspecified duration, sequela: Secondary | ICD-10-CM | POA: Diagnosis not present

## 2014-02-20 DIAGNOSIS — I5030 Unspecified diastolic (congestive) heart failure: Secondary | ICD-10-CM | POA: Diagnosis not present

## 2014-02-21 DIAGNOSIS — S065X9S Traumatic subdural hemorrhage with loss of consciousness of unspecified duration, sequela: Secondary | ICD-10-CM | POA: Diagnosis not present

## 2014-02-21 DIAGNOSIS — I5030 Unspecified diastolic (congestive) heart failure: Secondary | ICD-10-CM | POA: Diagnosis not present

## 2014-02-21 DIAGNOSIS — I2510 Atherosclerotic heart disease of native coronary artery without angina pectoris: Secondary | ICD-10-CM | POA: Diagnosis not present

## 2014-02-21 DIAGNOSIS — I10 Essential (primary) hypertension: Secondary | ICD-10-CM | POA: Diagnosis not present

## 2014-02-21 DIAGNOSIS — R531 Weakness: Secondary | ICD-10-CM | POA: Diagnosis not present

## 2014-02-21 DIAGNOSIS — M1990 Unspecified osteoarthritis, unspecified site: Secondary | ICD-10-CM | POA: Diagnosis not present

## 2014-02-22 DIAGNOSIS — I5030 Unspecified diastolic (congestive) heart failure: Secondary | ICD-10-CM | POA: Diagnosis not present

## 2014-02-22 DIAGNOSIS — I10 Essential (primary) hypertension: Secondary | ICD-10-CM | POA: Diagnosis not present

## 2014-02-22 DIAGNOSIS — S065X9S Traumatic subdural hemorrhage with loss of consciousness of unspecified duration, sequela: Secondary | ICD-10-CM | POA: Diagnosis not present

## 2014-02-22 DIAGNOSIS — M1990 Unspecified osteoarthritis, unspecified site: Secondary | ICD-10-CM | POA: Diagnosis not present

## 2014-02-22 DIAGNOSIS — I2510 Atherosclerotic heart disease of native coronary artery without angina pectoris: Secondary | ICD-10-CM | POA: Diagnosis not present

## 2014-02-22 DIAGNOSIS — R531 Weakness: Secondary | ICD-10-CM | POA: Diagnosis not present

## 2014-02-25 DIAGNOSIS — M1990 Unspecified osteoarthritis, unspecified site: Secondary | ICD-10-CM | POA: Diagnosis not present

## 2014-02-25 DIAGNOSIS — I2510 Atherosclerotic heart disease of native coronary artery without angina pectoris: Secondary | ICD-10-CM | POA: Diagnosis not present

## 2014-02-25 DIAGNOSIS — R531 Weakness: Secondary | ICD-10-CM | POA: Diagnosis not present

## 2014-02-25 DIAGNOSIS — I5030 Unspecified diastolic (congestive) heart failure: Secondary | ICD-10-CM | POA: Diagnosis not present

## 2014-02-25 DIAGNOSIS — S065X9S Traumatic subdural hemorrhage with loss of consciousness of unspecified duration, sequela: Secondary | ICD-10-CM | POA: Diagnosis not present

## 2014-02-25 DIAGNOSIS — I10 Essential (primary) hypertension: Secondary | ICD-10-CM | POA: Diagnosis not present

## 2014-03-01 DIAGNOSIS — Z79899 Other long term (current) drug therapy: Secondary | ICD-10-CM | POA: Diagnosis not present

## 2014-03-01 DIAGNOSIS — I10 Essential (primary) hypertension: Secondary | ICD-10-CM | POA: Diagnosis not present

## 2014-03-01 DIAGNOSIS — F419 Anxiety disorder, unspecified: Secondary | ICD-10-CM | POA: Diagnosis not present

## 2014-03-01 DIAGNOSIS — G8929 Other chronic pain: Secondary | ICD-10-CM | POA: Diagnosis not present

## 2014-03-06 ENCOUNTER — Other Ambulatory Visit: Payer: Self-pay | Admitting: Physical Medicine and Rehabilitation

## 2014-03-11 ENCOUNTER — Other Ambulatory Visit: Payer: Self-pay | Admitting: *Deleted

## 2014-03-11 MED ORDER — TAMSULOSIN HCL 0.4 MG PO CAPS: 0.4000 mg | ORAL_CAPSULE | Freq: Every day | ORAL | Status: DC

## 2014-03-11 NOTE — Telephone Encounter (Signed)
rec'd fax for rx refill, tamsulosin (Flomax) 0.4 mg, discussed with Sybil, sent Rx electronically and attempted to notify pt, no answer

## 2014-03-28 ENCOUNTER — Encounter (HOSPITAL_COMMUNITY): Payer: Self-pay | Admitting: Neurosurgery

## 2014-03-29 DIAGNOSIS — F419 Anxiety disorder, unspecified: Secondary | ICD-10-CM | POA: Diagnosis not present

## 2014-03-29 DIAGNOSIS — G8929 Other chronic pain: Secondary | ICD-10-CM | POA: Diagnosis not present

## 2014-03-29 DIAGNOSIS — L918 Other hypertrophic disorders of the skin: Secondary | ICD-10-CM | POA: Diagnosis not present

## 2014-05-23 DIAGNOSIS — F419 Anxiety disorder, unspecified: Secondary | ICD-10-CM | POA: Diagnosis not present

## 2014-05-23 DIAGNOSIS — G8929 Other chronic pain: Secondary | ICD-10-CM | POA: Diagnosis not present

## 2014-06-21 DIAGNOSIS — I10 Essential (primary) hypertension: Secondary | ICD-10-CM | POA: Diagnosis not present

## 2014-06-21 DIAGNOSIS — Z6832 Body mass index (BMI) 32.0-32.9, adult: Secondary | ICD-10-CM | POA: Diagnosis not present

## 2014-06-21 DIAGNOSIS — F419 Anxiety disorder, unspecified: Secondary | ICD-10-CM | POA: Diagnosis not present

## 2014-06-21 DIAGNOSIS — J310 Chronic rhinitis: Secondary | ICD-10-CM | POA: Diagnosis not present

## 2014-06-21 DIAGNOSIS — G8929 Other chronic pain: Secondary | ICD-10-CM | POA: Diagnosis not present

## 2014-07-18 DIAGNOSIS — Z79899 Other long term (current) drug therapy: Secondary | ICD-10-CM | POA: Diagnosis not present

## 2014-07-18 DIAGNOSIS — G8929 Other chronic pain: Secondary | ICD-10-CM | POA: Diagnosis not present

## 2014-07-18 DIAGNOSIS — R892 Abnormal level of other drugs, medicaments and biological substances in specimens from other organs, systems and tissues: Secondary | ICD-10-CM | POA: Diagnosis not present

## 2014-07-18 DIAGNOSIS — Z1389 Encounter for screening for other disorder: Secondary | ICD-10-CM | POA: Diagnosis not present

## 2014-07-18 DIAGNOSIS — Z6832 Body mass index (BMI) 32.0-32.9, adult: Secondary | ICD-10-CM | POA: Diagnosis not present

## 2014-07-18 DIAGNOSIS — I10 Essential (primary) hypertension: Secondary | ICD-10-CM | POA: Diagnosis not present

## 2014-07-18 DIAGNOSIS — F419 Anxiety disorder, unspecified: Secondary | ICD-10-CM | POA: Diagnosis not present

## 2014-08-16 DIAGNOSIS — Z6832 Body mass index (BMI) 32.0-32.9, adult: Secondary | ICD-10-CM | POA: Diagnosis not present

## 2014-08-16 DIAGNOSIS — I2510 Atherosclerotic heart disease of native coronary artery without angina pectoris: Secondary | ICD-10-CM | POA: Diagnosis not present

## 2014-08-16 DIAGNOSIS — I10 Essential (primary) hypertension: Secondary | ICD-10-CM | POA: Diagnosis not present

## 2014-08-16 DIAGNOSIS — N189 Chronic kidney disease, unspecified: Secondary | ICD-10-CM | POA: Diagnosis not present

## 2014-08-16 DIAGNOSIS — F419 Anxiety disorder, unspecified: Secondary | ICD-10-CM | POA: Diagnosis not present

## 2014-08-16 DIAGNOSIS — G8929 Other chronic pain: Secondary | ICD-10-CM | POA: Diagnosis not present

## 2014-09-13 DIAGNOSIS — J449 Chronic obstructive pulmonary disease, unspecified: Secondary | ICD-10-CM | POA: Diagnosis not present

## 2014-09-13 DIAGNOSIS — K219 Gastro-esophageal reflux disease without esophagitis: Secondary | ICD-10-CM | POA: Diagnosis not present

## 2014-09-13 DIAGNOSIS — Z720 Tobacco use: Secondary | ICD-10-CM | POA: Diagnosis not present

## 2014-09-13 DIAGNOSIS — F419 Anxiety disorder, unspecified: Secondary | ICD-10-CM | POA: Diagnosis not present

## 2014-09-13 DIAGNOSIS — F17200 Nicotine dependence, unspecified, uncomplicated: Secondary | ICD-10-CM | POA: Diagnosis not present

## 2014-09-13 DIAGNOSIS — Z6832 Body mass index (BMI) 32.0-32.9, adult: Secondary | ICD-10-CM | POA: Diagnosis not present

## 2014-09-13 DIAGNOSIS — I10 Essential (primary) hypertension: Secondary | ICD-10-CM | POA: Diagnosis not present

## 2014-09-13 DIAGNOSIS — G8929 Other chronic pain: Secondary | ICD-10-CM | POA: Diagnosis not present

## 2014-10-11 DIAGNOSIS — Z6832 Body mass index (BMI) 32.0-32.9, adult: Secondary | ICD-10-CM | POA: Diagnosis not present

## 2014-10-11 DIAGNOSIS — G8929 Other chronic pain: Secondary | ICD-10-CM | POA: Diagnosis not present

## 2014-10-11 DIAGNOSIS — F419 Anxiety disorder, unspecified: Secondary | ICD-10-CM | POA: Diagnosis not present

## 2014-10-11 DIAGNOSIS — I10 Essential (primary) hypertension: Secondary | ICD-10-CM | POA: Diagnosis not present

## 2014-10-11 DIAGNOSIS — Z720 Tobacco use: Secondary | ICD-10-CM | POA: Diagnosis not present

## 2014-10-11 DIAGNOSIS — J449 Chronic obstructive pulmonary disease, unspecified: Secondary | ICD-10-CM | POA: Diagnosis not present

## 2014-11-07 DIAGNOSIS — I739 Peripheral vascular disease, unspecified: Secondary | ICD-10-CM | POA: Diagnosis not present

## 2014-11-07 DIAGNOSIS — I2510 Atherosclerotic heart disease of native coronary artery without angina pectoris: Secondary | ICD-10-CM | POA: Diagnosis not present

## 2014-11-07 DIAGNOSIS — G8929 Other chronic pain: Secondary | ICD-10-CM | POA: Diagnosis not present

## 2014-11-07 DIAGNOSIS — Z6832 Body mass index (BMI) 32.0-32.9, adult: Secondary | ICD-10-CM | POA: Diagnosis not present

## 2014-11-07 DIAGNOSIS — F419 Anxiety disorder, unspecified: Secondary | ICD-10-CM | POA: Diagnosis not present

## 2014-11-07 DIAGNOSIS — J449 Chronic obstructive pulmonary disease, unspecified: Secondary | ICD-10-CM | POA: Diagnosis not present

## 2014-12-06 DIAGNOSIS — F419 Anxiety disorder, unspecified: Secondary | ICD-10-CM | POA: Diagnosis not present

## 2014-12-06 DIAGNOSIS — G8929 Other chronic pain: Secondary | ICD-10-CM | POA: Diagnosis not present

## 2014-12-06 DIAGNOSIS — Z6832 Body mass index (BMI) 32.0-32.9, adult: Secondary | ICD-10-CM | POA: Diagnosis not present

## 2014-12-06 DIAGNOSIS — I10 Essential (primary) hypertension: Secondary | ICD-10-CM | POA: Diagnosis not present

## 2014-12-06 DIAGNOSIS — Z9181 History of falling: Secondary | ICD-10-CM | POA: Diagnosis not present

## 2015-01-07 DIAGNOSIS — G8929 Other chronic pain: Secondary | ICD-10-CM | POA: Diagnosis not present

## 2015-01-07 DIAGNOSIS — I739 Peripheral vascular disease, unspecified: Secondary | ICD-10-CM | POA: Diagnosis not present

## 2015-01-07 DIAGNOSIS — I8310 Varicose veins of unspecified lower extremity with inflammation: Secondary | ICD-10-CM | POA: Diagnosis not present

## 2015-01-07 DIAGNOSIS — I10 Essential (primary) hypertension: Secondary | ICD-10-CM | POA: Diagnosis not present

## 2015-01-07 DIAGNOSIS — F419 Anxiety disorder, unspecified: Secondary | ICD-10-CM | POA: Diagnosis not present

## 2015-01-07 DIAGNOSIS — N189 Chronic kidney disease, unspecified: Secondary | ICD-10-CM | POA: Diagnosis not present

## 2015-01-07 DIAGNOSIS — Z89619 Acquired absence of unspecified leg above knee: Secondary | ICD-10-CM | POA: Diagnosis not present

## 2015-01-07 DIAGNOSIS — Z23 Encounter for immunization: Secondary | ICD-10-CM | POA: Diagnosis not present

## 2015-02-05 DIAGNOSIS — Z6832 Body mass index (BMI) 32.0-32.9, adult: Secondary | ICD-10-CM | POA: Diagnosis not present

## 2015-02-05 DIAGNOSIS — G8929 Other chronic pain: Secondary | ICD-10-CM | POA: Diagnosis not present

## 2015-02-05 DIAGNOSIS — F419 Anxiety disorder, unspecified: Secondary | ICD-10-CM | POA: Diagnosis not present

## 2015-03-06 DIAGNOSIS — Z6832 Body mass index (BMI) 32.0-32.9, adult: Secondary | ICD-10-CM | POA: Diagnosis not present

## 2015-03-06 DIAGNOSIS — G8929 Other chronic pain: Secondary | ICD-10-CM | POA: Diagnosis not present

## 2015-03-06 DIAGNOSIS — F419 Anxiety disorder, unspecified: Secondary | ICD-10-CM | POA: Diagnosis not present

## 2015-04-04 DIAGNOSIS — I2510 Atherosclerotic heart disease of native coronary artery without angina pectoris: Secondary | ICD-10-CM | POA: Diagnosis not present

## 2015-04-04 DIAGNOSIS — Z89619 Acquired absence of unspecified leg above knee: Secondary | ICD-10-CM | POA: Diagnosis not present

## 2015-04-04 DIAGNOSIS — Z6832 Body mass index (BMI) 32.0-32.9, adult: Secondary | ICD-10-CM | POA: Diagnosis not present

## 2015-04-04 DIAGNOSIS — G8929 Other chronic pain: Secondary | ICD-10-CM | POA: Diagnosis not present

## 2015-04-04 DIAGNOSIS — J441 Chronic obstructive pulmonary disease with (acute) exacerbation: Secondary | ICD-10-CM | POA: Diagnosis not present

## 2015-04-04 DIAGNOSIS — I10 Essential (primary) hypertension: Secondary | ICD-10-CM | POA: Diagnosis not present

## 2015-04-04 DIAGNOSIS — I739 Peripheral vascular disease, unspecified: Secondary | ICD-10-CM | POA: Diagnosis not present

## 2015-04-04 DIAGNOSIS — F419 Anxiety disorder, unspecified: Secondary | ICD-10-CM | POA: Diagnosis not present

## 2015-05-02 DIAGNOSIS — G8929 Other chronic pain: Secondary | ICD-10-CM | POA: Diagnosis not present

## 2015-05-02 DIAGNOSIS — F419 Anxiety disorder, unspecified: Secondary | ICD-10-CM | POA: Diagnosis not present

## 2015-05-02 DIAGNOSIS — Z6832 Body mass index (BMI) 32.0-32.9, adult: Secondary | ICD-10-CM | POA: Diagnosis not present

## 2015-05-02 DIAGNOSIS — J449 Chronic obstructive pulmonary disease, unspecified: Secondary | ICD-10-CM | POA: Diagnosis not present

## 2015-06-26 DIAGNOSIS — Z6832 Body mass index (BMI) 32.0-32.9, adult: Secondary | ICD-10-CM | POA: Diagnosis not present

## 2015-06-26 DIAGNOSIS — K219 Gastro-esophageal reflux disease without esophagitis: Secondary | ICD-10-CM | POA: Diagnosis not present

## 2015-06-26 DIAGNOSIS — F419 Anxiety disorder, unspecified: Secondary | ICD-10-CM | POA: Diagnosis not present

## 2015-06-26 DIAGNOSIS — N400 Enlarged prostate without lower urinary tract symptoms: Secondary | ICD-10-CM | POA: Diagnosis not present

## 2015-06-26 DIAGNOSIS — I10 Essential (primary) hypertension: Secondary | ICD-10-CM | POA: Diagnosis not present

## 2015-06-26 DIAGNOSIS — G8929 Other chronic pain: Secondary | ICD-10-CM | POA: Diagnosis not present

## 2015-07-18 DIAGNOSIS — G8929 Other chronic pain: Secondary | ICD-10-CM | POA: Diagnosis not present

## 2015-07-18 DIAGNOSIS — E669 Obesity, unspecified: Secondary | ICD-10-CM | POA: Diagnosis not present

## 2015-07-18 DIAGNOSIS — F419 Anxiety disorder, unspecified: Secondary | ICD-10-CM | POA: Diagnosis not present

## 2015-07-18 DIAGNOSIS — Z6832 Body mass index (BMI) 32.0-32.9, adult: Secondary | ICD-10-CM | POA: Diagnosis not present

## 2015-08-15 DIAGNOSIS — J449 Chronic obstructive pulmonary disease, unspecified: Secondary | ICD-10-CM | POA: Diagnosis not present

## 2015-08-15 DIAGNOSIS — I10 Essential (primary) hypertension: Secondary | ICD-10-CM | POA: Diagnosis not present

## 2015-08-15 DIAGNOSIS — I739 Peripheral vascular disease, unspecified: Secondary | ICD-10-CM | POA: Diagnosis not present

## 2015-08-15 DIAGNOSIS — N189 Chronic kidney disease, unspecified: Secondary | ICD-10-CM | POA: Diagnosis not present

## 2015-08-15 DIAGNOSIS — F419 Anxiety disorder, unspecified: Secondary | ICD-10-CM | POA: Diagnosis not present

## 2015-08-15 DIAGNOSIS — K219 Gastro-esophageal reflux disease without esophagitis: Secondary | ICD-10-CM | POA: Diagnosis not present

## 2015-08-15 DIAGNOSIS — G8929 Other chronic pain: Secondary | ICD-10-CM | POA: Diagnosis not present

## 2015-08-15 DIAGNOSIS — Z6832 Body mass index (BMI) 32.0-32.9, adult: Secondary | ICD-10-CM | POA: Diagnosis not present

## 2015-09-15 DIAGNOSIS — E669 Obesity, unspecified: Secondary | ICD-10-CM | POA: Diagnosis not present

## 2015-09-15 DIAGNOSIS — Z6832 Body mass index (BMI) 32.0-32.9, adult: Secondary | ICD-10-CM | POA: Diagnosis not present

## 2015-09-15 DIAGNOSIS — F419 Anxiety disorder, unspecified: Secondary | ICD-10-CM | POA: Diagnosis not present

## 2015-09-15 DIAGNOSIS — G8929 Other chronic pain: Secondary | ICD-10-CM | POA: Diagnosis not present

## 2015-10-16 DIAGNOSIS — G8929 Other chronic pain: Secondary | ICD-10-CM | POA: Diagnosis not present

## 2015-10-16 DIAGNOSIS — E669 Obesity, unspecified: Secondary | ICD-10-CM | POA: Diagnosis not present

## 2015-10-16 DIAGNOSIS — Z6832 Body mass index (BMI) 32.0-32.9, adult: Secondary | ICD-10-CM | POA: Diagnosis not present

## 2015-10-16 DIAGNOSIS — F419 Anxiety disorder, unspecified: Secondary | ICD-10-CM | POA: Diagnosis not present

## 2015-11-18 DIAGNOSIS — G8929 Other chronic pain: Secondary | ICD-10-CM | POA: Diagnosis not present

## 2015-11-18 DIAGNOSIS — F419 Anxiety disorder, unspecified: Secondary | ICD-10-CM | POA: Diagnosis not present

## 2015-11-18 DIAGNOSIS — Z1389 Encounter for screening for other disorder: Secondary | ICD-10-CM | POA: Diagnosis not present

## 2015-11-18 DIAGNOSIS — E669 Obesity, unspecified: Secondary | ICD-10-CM | POA: Diagnosis not present

## 2015-11-18 DIAGNOSIS — Z6832 Body mass index (BMI) 32.0-32.9, adult: Secondary | ICD-10-CM | POA: Diagnosis not present

## 2015-11-18 DIAGNOSIS — Z9181 History of falling: Secondary | ICD-10-CM | POA: Diagnosis not present

## 2015-12-18 DIAGNOSIS — Z79899 Other long term (current) drug therapy: Secondary | ICD-10-CM | POA: Diagnosis not present

## 2015-12-18 DIAGNOSIS — F419 Anxiety disorder, unspecified: Secondary | ICD-10-CM | POA: Diagnosis not present

## 2015-12-18 DIAGNOSIS — J449 Chronic obstructive pulmonary disease, unspecified: Secondary | ICD-10-CM | POA: Diagnosis not present

## 2015-12-18 DIAGNOSIS — Z6832 Body mass index (BMI) 32.0-32.9, adult: Secondary | ICD-10-CM | POA: Diagnosis not present

## 2015-12-18 DIAGNOSIS — G8929 Other chronic pain: Secondary | ICD-10-CM | POA: Diagnosis not present

## 2015-12-18 DIAGNOSIS — Z23 Encounter for immunization: Secondary | ICD-10-CM | POA: Diagnosis not present

## 2015-12-18 DIAGNOSIS — E669 Obesity, unspecified: Secondary | ICD-10-CM | POA: Diagnosis not present

## 2016-02-24 DIAGNOSIS — G8929 Other chronic pain: Secondary | ICD-10-CM | POA: Diagnosis not present

## 2016-02-24 DIAGNOSIS — F419 Anxiety disorder, unspecified: Secondary | ICD-10-CM | POA: Diagnosis not present

## 2016-02-24 DIAGNOSIS — E669 Obesity, unspecified: Secondary | ICD-10-CM | POA: Diagnosis not present

## 2016-02-24 DIAGNOSIS — Z6832 Body mass index (BMI) 32.0-32.9, adult: Secondary | ICD-10-CM | POA: Diagnosis not present

## 2016-04-06 DIAGNOSIS — Z6832 Body mass index (BMI) 32.0-32.9, adult: Secondary | ICD-10-CM | POA: Diagnosis not present

## 2016-04-06 DIAGNOSIS — G8929 Other chronic pain: Secondary | ICD-10-CM | POA: Diagnosis not present

## 2016-04-06 DIAGNOSIS — J329 Chronic sinusitis, unspecified: Secondary | ICD-10-CM | POA: Diagnosis not present

## 2016-04-06 DIAGNOSIS — F419 Anxiety disorder, unspecified: Secondary | ICD-10-CM | POA: Diagnosis not present

## 2016-07-02 DIAGNOSIS — F419 Anxiety disorder, unspecified: Secondary | ICD-10-CM | POA: Diagnosis not present

## 2016-07-02 DIAGNOSIS — N189 Chronic kidney disease, unspecified: Secondary | ICD-10-CM | POA: Diagnosis not present

## 2016-07-02 DIAGNOSIS — I10 Essential (primary) hypertension: Secondary | ICD-10-CM | POA: Diagnosis not present

## 2016-07-02 DIAGNOSIS — I739 Peripheral vascular disease, unspecified: Secondary | ICD-10-CM | POA: Diagnosis not present

## 2016-07-02 DIAGNOSIS — G8929 Other chronic pain: Secondary | ICD-10-CM | POA: Diagnosis not present

## 2016-07-02 DIAGNOSIS — Z6832 Body mass index (BMI) 32.0-32.9, adult: Secondary | ICD-10-CM | POA: Diagnosis not present

## 2016-07-02 DIAGNOSIS — K219 Gastro-esophageal reflux disease without esophagitis: Secondary | ICD-10-CM | POA: Diagnosis not present

## 2016-07-02 DIAGNOSIS — J449 Chronic obstructive pulmonary disease, unspecified: Secondary | ICD-10-CM | POA: Diagnosis not present

## 2016-07-02 DIAGNOSIS — I2510 Atherosclerotic heart disease of native coronary artery without angina pectoris: Secondary | ICD-10-CM | POA: Diagnosis not present

## 2016-08-02 DIAGNOSIS — G8929 Other chronic pain: Secondary | ICD-10-CM | POA: Diagnosis not present

## 2016-08-02 DIAGNOSIS — Z6832 Body mass index (BMI) 32.0-32.9, adult: Secondary | ICD-10-CM | POA: Diagnosis not present

## 2016-08-02 DIAGNOSIS — E669 Obesity, unspecified: Secondary | ICD-10-CM | POA: Diagnosis not present

## 2016-08-31 DIAGNOSIS — F419 Anxiety disorder, unspecified: Secondary | ICD-10-CM | POA: Diagnosis not present

## 2016-08-31 DIAGNOSIS — Z79899 Other long term (current) drug therapy: Secondary | ICD-10-CM | POA: Diagnosis not present

## 2016-08-31 DIAGNOSIS — N189 Chronic kidney disease, unspecified: Secondary | ICD-10-CM | POA: Diagnosis not present

## 2016-08-31 DIAGNOSIS — I2510 Atherosclerotic heart disease of native coronary artery without angina pectoris: Secondary | ICD-10-CM | POA: Diagnosis not present

## 2016-08-31 DIAGNOSIS — I10 Essential (primary) hypertension: Secondary | ICD-10-CM | POA: Diagnosis not present

## 2016-08-31 DIAGNOSIS — I739 Peripheral vascular disease, unspecified: Secondary | ICD-10-CM | POA: Diagnosis not present

## 2016-08-31 DIAGNOSIS — G8929 Other chronic pain: Secondary | ICD-10-CM | POA: Diagnosis not present

## 2016-08-31 DIAGNOSIS — J449 Chronic obstructive pulmonary disease, unspecified: Secondary | ICD-10-CM | POA: Diagnosis not present

## 2016-08-31 DIAGNOSIS — K219 Gastro-esophageal reflux disease without esophagitis: Secondary | ICD-10-CM | POA: Diagnosis not present

## 2016-12-07 DIAGNOSIS — E669 Obesity, unspecified: Secondary | ICD-10-CM | POA: Diagnosis not present

## 2016-12-07 DIAGNOSIS — Z9181 History of falling: Secondary | ICD-10-CM | POA: Diagnosis not present

## 2016-12-07 DIAGNOSIS — G8929 Other chronic pain: Secondary | ICD-10-CM | POA: Diagnosis not present

## 2016-12-07 DIAGNOSIS — I2510 Atherosclerotic heart disease of native coronary artery without angina pectoris: Secondary | ICD-10-CM | POA: Diagnosis not present

## 2016-12-07 DIAGNOSIS — Z79899 Other long term (current) drug therapy: Secondary | ICD-10-CM | POA: Diagnosis not present

## 2016-12-07 DIAGNOSIS — Z1389 Encounter for screening for other disorder: Secondary | ICD-10-CM | POA: Diagnosis not present

## 2016-12-07 DIAGNOSIS — N400 Enlarged prostate without lower urinary tract symptoms: Secondary | ICD-10-CM | POA: Diagnosis not present

## 2016-12-07 DIAGNOSIS — F419 Anxiety disorder, unspecified: Secondary | ICD-10-CM | POA: Diagnosis not present

## 2016-12-07 DIAGNOSIS — J449 Chronic obstructive pulmonary disease, unspecified: Secondary | ICD-10-CM | POA: Diagnosis not present

## 2017-03-21 DIAGNOSIS — Z79899 Other long term (current) drug therapy: Secondary | ICD-10-CM | POA: Diagnosis not present

## 2017-03-21 DIAGNOSIS — G8929 Other chronic pain: Secondary | ICD-10-CM | POA: Diagnosis not present

## 2017-03-21 DIAGNOSIS — Z6832 Body mass index (BMI) 32.0-32.9, adult: Secondary | ICD-10-CM | POA: Diagnosis not present

## 2017-03-21 DIAGNOSIS — Z139 Encounter for screening, unspecified: Secondary | ICD-10-CM | POA: Diagnosis not present

## 2017-03-21 DIAGNOSIS — Z23 Encounter for immunization: Secondary | ICD-10-CM | POA: Diagnosis not present

## 2017-07-07 DIAGNOSIS — K219 Gastro-esophageal reflux disease without esophagitis: Secondary | ICD-10-CM | POA: Diagnosis not present

## 2017-07-07 DIAGNOSIS — F419 Anxiety disorder, unspecified: Secondary | ICD-10-CM | POA: Diagnosis not present

## 2017-07-07 DIAGNOSIS — I2510 Atherosclerotic heart disease of native coronary artery without angina pectoris: Secondary | ICD-10-CM | POA: Diagnosis not present

## 2017-07-07 DIAGNOSIS — I739 Peripheral vascular disease, unspecified: Secondary | ICD-10-CM | POA: Diagnosis not present

## 2017-07-07 DIAGNOSIS — G8929 Other chronic pain: Secondary | ICD-10-CM | POA: Diagnosis not present

## 2017-07-07 DIAGNOSIS — Z79899 Other long term (current) drug therapy: Secondary | ICD-10-CM | POA: Diagnosis not present

## 2017-07-07 DIAGNOSIS — N400 Enlarged prostate without lower urinary tract symptoms: Secondary | ICD-10-CM | POA: Diagnosis not present

## 2017-07-07 DIAGNOSIS — N189 Chronic kidney disease, unspecified: Secondary | ICD-10-CM | POA: Diagnosis not present

## 2017-07-07 DIAGNOSIS — J449 Chronic obstructive pulmonary disease, unspecified: Secondary | ICD-10-CM | POA: Diagnosis not present

## 2017-07-07 DIAGNOSIS — E669 Obesity, unspecified: Secondary | ICD-10-CM | POA: Diagnosis not present

## 2017-10-25 ENCOUNTER — Other Ambulatory Visit: Payer: Self-pay

## 2017-10-25 ENCOUNTER — Emergency Department (HOSPITAL_COMMUNITY)
Admission: EM | Admit: 2017-10-25 | Discharge: 2017-10-25 | Disposition: A | Discharge status: Home / Self Care | Payer: Medicare HMO | Attending: Emergency Medicine | Admitting: Emergency Medicine

## 2017-10-25 ENCOUNTER — Encounter (HOSPITAL_COMMUNITY): Payer: Self-pay | Admitting: Emergency Medicine

## 2017-10-25 DIAGNOSIS — R0602 Shortness of breath: Secondary | ICD-10-CM | POA: Insufficient documentation

## 2017-10-25 DIAGNOSIS — R531 Weakness: Secondary | ICD-10-CM

## 2017-10-25 DIAGNOSIS — H538 Other visual disturbances: Secondary | ICD-10-CM | POA: Diagnosis not present

## 2017-10-25 DIAGNOSIS — Z5321 Procedure and treatment not carried out due to patient leaving prior to being seen by health care provider: Secondary | ICD-10-CM | POA: Diagnosis not present

## 2017-10-25 DIAGNOSIS — R51 Headache: Secondary | ICD-10-CM | POA: Insufficient documentation

## 2017-10-25 DIAGNOSIS — M6281 Muscle weakness (generalized): Secondary | ICD-10-CM | POA: Insufficient documentation

## 2017-10-25 LAB — I-STAT CHEM 8, ED
BUN: 42 mg/dL — ABNORMAL HIGH (ref 8–23)
CHLORIDE: 99 mmol/L (ref 98–111)
CREATININE: 2.3 mg/dL — AB (ref 0.61–1.24)
Calcium, Ion: 1.09 mmol/L — ABNORMAL LOW (ref 1.15–1.40)
GLUCOSE: 85 mg/dL (ref 70–99)
HCT: 51 % (ref 39.0–52.0)
Hemoglobin: 17.3 g/dL — ABNORMAL HIGH (ref 13.0–17.0)
POTASSIUM: 4.5 mmol/L (ref 3.5–5.1)
Sodium: 135 mmol/L (ref 135–145)
TCO2: 28 mmol/L (ref 22–32)

## 2017-10-25 LAB — COMPREHENSIVE METABOLIC PANEL
ALK PHOS: 78 U/L (ref 38–126)
ALT: 13 U/L (ref 0–44)
AST: 14 U/L — AB (ref 15–41)
Albumin: 3.2 g/dL — ABNORMAL LOW (ref 3.5–5.0)
Anion gap: 11 (ref 5–15)
BUN: 28 mg/dL — AB (ref 8–23)
CHLORIDE: 99 mmol/L (ref 98–111)
CO2: 24 mmol/L (ref 22–32)
CREATININE: 2.21 mg/dL — AB (ref 0.61–1.24)
Calcium: 9 mg/dL (ref 8.9–10.3)
GFR calc Af Amer: 31 mL/min — ABNORMAL LOW (ref >60)
GFR, EST NON AFRICAN AMERICAN: 27 mL/min — AB (ref >60)
Glucose, Bld: 87 mg/dL (ref 70–99)
Potassium: 4.2 mmol/L (ref 3.5–5.1)
Sodium: 134 mmol/L — ABNORMAL LOW (ref 135–145)
Total Bilirubin: 1.1 mg/dL (ref 0.3–1.2)
Total Protein: 7.9 g/dL (ref 6.5–8.1)

## 2017-10-25 LAB — CBC
HEMATOCRIT: 51.3 % (ref 39.0–52.0)
HEMOGLOBIN: 15.1 g/dL (ref 13.0–17.0)
MCH: 26.3 pg (ref 26.0–34.0)
MCHC: 29.4 g/dL — ABNORMAL LOW (ref 30.0–36.0)
MCV: 89.2 fL (ref 78.0–100.0)
Platelets: 139 10*3/uL — ABNORMAL LOW (ref 150–400)
RBC: 5.75 MIL/uL (ref 4.22–5.81)
RDW: 15.7 % — ABNORMAL HIGH (ref 11.5–15.5)
WBC: 9.6 10*3/uL (ref 4.0–10.5)

## 2017-10-25 LAB — APTT: aPTT: 30 seconds (ref 24–36)

## 2017-10-25 LAB — PROTIME-INR
INR: 1.12
Prothrombin Time: 14.3 seconds (ref 11.4–15.2)

## 2017-10-25 LAB — DIFFERENTIAL
ABS IMMATURE GRANULOCYTES: 0 10*3/uL (ref 0.0–0.1)
BASOS PCT: 0 %
Basophils Absolute: 0 10*3/uL (ref 0.0–0.1)
Eosinophils Absolute: 0.1 10*3/uL (ref 0.0–0.7)
Eosinophils Relative: 1 %
Immature Granulocytes: 0 %
LYMPHS ABS: 1.2 10*3/uL (ref 0.7–4.0)
LYMPHS PCT: 13 %
MONOS PCT: 10 %
Monocytes Absolute: 0.9 10*3/uL (ref 0.1–1.0)
NEUTROS ABS: 7.3 10*3/uL (ref 1.7–7.7)
NEUTROS PCT: 76 %

## 2017-10-25 LAB — I-STAT TROPONIN, ED: TROPONIN I, POC: 0.03 ng/mL (ref 0.00–0.08)

## 2017-10-25 NOTE — ED Provider Notes (Cosign Needed)
Patient placed in Quick Look pathway, seen and evaluated   Chief Complaint: dizziness weakness  HPI:   Weak blurred vision short of breah  ROS: no fever, no cough  Physical Exam:   Gen: No distress  Neuro: Awake and Alert  Skin: Warm    Focused Exam: Lungs clear,  Heart tachycardia   Initiation of care has begun. The patient has been counseled on the process, plan, and necessity for staying for the completion/evaluation, and the remainder of the medical screening examination   Fransico Meadow, Hershal Coria 10/25/17 2008

## 2017-10-25 NOTE — ED Triage Notes (Signed)
Per family pt has reports HA, gen weakness, blurred vision, SOB since this AM. Neuro intact upon assessment.

## 2017-10-25 NOTE — ED Notes (Signed)
Pt came up and said they were leaving, pt is feeling better and would just see his PCP tomorrow. RN Emilie notified. Pt left.

## 2018-09-18 ENCOUNTER — Other Ambulatory Visit: Payer: Self-pay

## 2018-09-18 ENCOUNTER — Emergency Department (HOSPITAL_COMMUNITY)
Admission: EM | Admit: 2018-09-18 | Discharge: 2018-09-19 | Disposition: A | Discharge status: Home / Self Care | Payer: Medicare HMO | Attending: Emergency Medicine | Admitting: Emergency Medicine

## 2018-09-18 ENCOUNTER — Emergency Department (HOSPITAL_COMMUNITY): Payer: Medicare HMO

## 2018-09-18 ENCOUNTER — Encounter (HOSPITAL_COMMUNITY): Payer: Self-pay

## 2018-09-18 DIAGNOSIS — Z89612 Acquired absence of left leg above knee: Secondary | ICD-10-CM | POA: Insufficient documentation

## 2018-09-18 DIAGNOSIS — F17290 Nicotine dependence, other tobacco product, uncomplicated: Secondary | ICD-10-CM | POA: Insufficient documentation

## 2018-09-18 DIAGNOSIS — R0602 Shortness of breath: Secondary | ICD-10-CM

## 2018-09-18 DIAGNOSIS — Z20828 Contact with and (suspected) exposure to other viral communicable diseases: Secondary | ICD-10-CM | POA: Insufficient documentation

## 2018-09-18 DIAGNOSIS — I110 Hypertensive heart disease with heart failure: Secondary | ICD-10-CM | POA: Insufficient documentation

## 2018-09-18 DIAGNOSIS — Z79899 Other long term (current) drug therapy: Secondary | ICD-10-CM | POA: Diagnosis not present

## 2018-09-18 DIAGNOSIS — I2510 Atherosclerotic heart disease of native coronary artery without angina pectoris: Secondary | ICD-10-CM | POA: Insufficient documentation

## 2018-09-18 DIAGNOSIS — I5020 Unspecified systolic (congestive) heart failure: Secondary | ICD-10-CM | POA: Diagnosis not present

## 2018-09-18 LAB — CBC WITH DIFFERENTIAL/PLATELET
Abs Immature Granulocytes: 0.04 10*3/uL (ref 0.00–0.07)
Basophils Absolute: 0 10*3/uL (ref 0.0–0.1)
Basophils Relative: 0 %
Eosinophils Absolute: 0.1 10*3/uL (ref 0.0–0.5)
Eosinophils Relative: 1 %
HCT: 45.6 % (ref 39.0–52.0)
Hemoglobin: 14 g/dL (ref 13.0–17.0)
Immature Granulocytes: 0 %
Lymphocytes Relative: 11 %
Lymphs Abs: 1.1 10*3/uL (ref 0.7–4.0)
MCH: 26.6 pg (ref 26.0–34.0)
MCHC: 30.7 g/dL (ref 30.0–36.0)
MCV: 86.7 fL (ref 80.0–100.0)
Monocytes Absolute: 0.9 10*3/uL (ref 0.1–1.0)
Monocytes Relative: 10 %
Neutro Abs: 7.1 10*3/uL (ref 1.7–7.7)
Neutrophils Relative %: 78 %
Platelets: 149 10*3/uL — ABNORMAL LOW (ref 150–400)
RBC: 5.26 MIL/uL (ref 4.22–5.81)
RDW: 15.7 % — ABNORMAL HIGH (ref 11.5–15.5)
WBC: 9.2 10*3/uL (ref 4.0–10.5)
nRBC: 0 % (ref 0.0–0.2)

## 2018-09-18 LAB — COMPREHENSIVE METABOLIC PANEL
ALT: 17 U/L (ref 0–44)
AST: 16 U/L (ref 15–41)
Albumin: 2.8 g/dL — ABNORMAL LOW (ref 3.5–5.0)
Alkaline Phosphatase: 60 U/L (ref 38–126)
Anion gap: 8 (ref 5–15)
BUN: 31 mg/dL — ABNORMAL HIGH (ref 8–23)
CO2: 25 mmol/L (ref 22–32)
Calcium: 8.4 mg/dL — ABNORMAL LOW (ref 8.9–10.3)
Chloride: 99 mmol/L (ref 98–111)
Creatinine, Ser: 2.23 mg/dL — ABNORMAL HIGH (ref 0.61–1.24)
GFR calc Af Amer: 32 mL/min — ABNORMAL LOW (ref >60)
GFR calc non Af Amer: 27 mL/min — ABNORMAL LOW (ref >60)
Glucose, Bld: 149 mg/dL — ABNORMAL HIGH (ref 70–99)
Potassium: 3.9 mmol/L (ref 3.5–5.1)
Sodium: 132 mmol/L — ABNORMAL LOW (ref 135–145)
Total Bilirubin: 0.8 mg/dL (ref 0.3–1.2)
Total Protein: 6.8 g/dL (ref 6.5–8.1)

## 2018-09-18 LAB — SARS CORONAVIRUS 2 BY RT PCR (HOSPITAL ORDER, PERFORMED IN ~~LOC~~ HOSPITAL LAB): SARS Coronavirus 2: NEGATIVE

## 2018-09-18 LAB — D-DIMER, QUANTITATIVE: D-Dimer, Quant: 2.05 ug/mL-FEU — ABNORMAL HIGH (ref 0.00–0.50)

## 2018-09-18 MED ORDER — ALBUTEROL SULFATE HFA 108 (90 BASE) MCG/ACT IN AERS
4.0000 | INHALATION_SPRAY | Freq: Once | RESPIRATORY_TRACT | Status: AC
  Administered 2018-09-18: 4 via RESPIRATORY_TRACT
  Filled 2018-09-18: qty 6.7

## 2018-09-18 MED ORDER — PREDNISONE 20 MG PO TABS
40.0000 mg | ORAL_TABLET | Freq: Once | ORAL | Status: AC
  Administered 2018-09-18: 40 mg via ORAL
  Filled 2018-09-18: qty 2

## 2018-09-18 NOTE — ED Triage Notes (Signed)
Per [facility], pt from home w/ a c/o SOB. Pt has a hx of COPD and was self-administering a breathing tx upon EMS arrival. They noted clear lungs w/ an SpO2 of 88% on RA. Admin of O2 3 lpm via [place] brought SpO2 to 98%. Denied CP or weakness. 12 lead indicating sinus brady to NSR w PVCs.   96/50 -->126/62 after 500 mL NaCl. HR70

## 2018-09-18 NOTE — ED Notes (Signed)
Daughter- [name], [phone] would like an update

## 2018-09-18 NOTE — ED Provider Notes (Signed)
[facility] EMERGENCY DEPARTMENT Provider Note   CSN: [id number] Arrival date & time: 09/18/18  1713    History   Chief Complaint Chief Complaint  Patient presents with  . Shortness of Breath    HPI Kevin [name] is a 78 y.o. male.     The history is provided by the patient, the EMS personnel and medical records. No language interpreter was used.  Shortness of Breath   Kevin [name] is a 78 y.o. male who presents to the Emergency Department complaining of sob.  He presents to the ED by EMS for evaluation of sudden onset SOB that occurred with mild exertion.  Denies chest pain, fever.   He has a mild cough.  He has experienced similar sxs in the past but not as severe.  No known covid 19 exposures.  No recent medication changes or missed meds.  He is a former smoker.  He is feeling much better after O2 administration by EMS.  Sxs are severe and constant in nature.  Lives with son and nephew.  Not on oxygen at home.    Past Medical History:  Diagnosis Date  . Arthritis   . BPH (benign prostatic hyperplasia)   . CHF (congestive heart failure) ([place])    a. NICM/acute CHF 2007 with EF 10-20%. b. Improved EF 50-55% by echo 12/2011.  Marland Kitchen Coronary artery disease    a. NSTEMI 2007 with cath showing mild nonobstructive CAD by cath 2007 (40% mLAD, 40%mRCA) at time of dx of NICM.  Marland Kitchen DVT (deep venous thrombosis) ([place])   . Hyperlipidemia   . Hypertension   . Obesity   . Pulmonary embolism ([place])   . 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 Noted by cath 2007, recommendation for OP [place].  . Valvular heart disease    a. Mod MR by echo 06/2005 but no significant MR 2013.  . Wide-complex tachycardia ([place])    a. Noted 2007.    Patient Active Problem List   Diagnosis Date Noted  . Syncope 01/10/2014  . Dehydration 01/10/2014  . Bradycardia 01/10/2014  . Systolic dysfunction 40/12/2723  . Traumatic subdural hemorrhage ([place]) 12/13/2013  . Ventricular bigeminy 12/12/2013  . Subdural  hematoma ([place]) 12/05/2013  . 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 Subdural hematoma without coma ([place]) 12/04/2013  . Amputee, 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  . Renal failure 01/11/2012  . Leucocytosis 01/11/2012  . DVT, femoral, acute ([place]) 01/11/2012  . DVT of axillary vein, acute ([place]) 01/04/2012  . Acute pulmonary embolism ([place]) 01/04/2012  . 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 Severe sepsis(995.92) 01/02/2012  . Septic shock(785.52) 01/02/2012  . Hypotension 01/02/2012  . 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 HTN (hypertension) 12/31/2011  . CAD (coronary artery disease) 12/31/2011  . CHF (congestive heart failure) ([place]) 12/31/2011  . Tobacco abuse 12/31/2011  . Hyperlipidemia 12/31/2011    Past Surgical History:  Procedure Laterality Date  . AMPUTATION  01/05/2012   Procedure: AMPUTATION ABOVE KNEE;  Surgeon: Rosetta Posner, MD;  Location: [facility];  Service: Vascular;  Laterality: Left;  . CRANIOTOMY Left 12/05/2013   Procedure: CRANIOTOMY HEMATOMA EVACUATION SUBDURAL- LEFT;  Surgeon: Ashok Pall, MD;  Location: [facility];  Service: Neurosurgery;  Laterality: Left;  CRANIOTOMY HEMATOMA EVACUATION SUBDURAL- LEFT  . VENA CAVA FILTER PLACEMENT  01/03/2012   Procedure: INSERTION VENA-CAVA FILTER;  Surgeon: Rosetta Posner, MD;  Location: [facility];  Service: Vascular;  Laterality:  Left;        Home Medications    Prior to Admission medications   Medication Sig Start Date End Date Taking? Authorizing Provider  atorvastatin (LIPITOR) 20 MG tablet Take 1 tablet (20 mg total) by mouth daily. 01/29/14   Meredith Staggers, MD  atorvastatin (LIPITOR) 40 MG tablet  07/24/18   [provider]  carvedilol (COREG) 6.25 MG tablet  01/02/14   [provider]  cefixime (SUPRAX) 400 MG tablet Take 1 tablet (400 mg total) by mouth daily. 01/02/14   Love, Ivan Anchors, PA-C  diazepam (VALIUM) 5 MG tablet  11/09/13   [provider]   hydrocerin (EUCERIN) CREA Apply 1 application topically 2 (two) times daily. To dry flaky areas 01/02/14   Love, Ivan Anchors, PA-C  levETIRAcetam (KEPPRA) 500 MG tablet Take 1 tablet (500 mg total) by mouth 2 (two) times daily. 01/02/14   LoveIvan Anchors, PA-C  oxyCODONE-acetaminophen (PERCOCET) 10-325 MG per tablet  01/03/14   [provider]  pantoprazole (PROTONIX) 40 MG tablet Take 1 tablet (40 mg total) by mouth at bedtime. 01/02/14   Love, Ivan Anchors, PA-C  tamsulosin (FLOMAX) 0.4 MG CAPS capsule Take 1 capsule (0.4 mg total) by mouth daily. 03/11/14   Meredith Staggers, MD  traMADol (ULTRAM) 50 MG tablet Take 1 tablet (50 mg total) by mouth every 6 (six) hours as needed for moderate pain or severe pain. 01/02/14   LovePecolia Ades  VENTOLIN HFA 108 [phone] Base) MCG/ACT inhaler  09/01/18   [provider]    Family History Family History  Problem Relation Age of Onset  . Heart attack Mother        In her 44's  . Stroke Mother        In her 30's  . CAD Brother     Social History Social History   Tobacco Use  . Smoking status: Former Research scientist (life sciences)  . Smokeless tobacco: Current User  Substance Use Topics  . Alcohol use: No  . Drug use: No     Allergies   Patient has no known allergies.   Review of Systems Review of Systems  Respiratory: Positive for shortness of breath.   All other systems reviewed and are negative.    Physical Exam Updated Vital Signs BP (!) 137/96   Pulse 63   Temp 97.7 F (36.5 C) (Oral)   Resp 16   Ht 5\' 7"  (1.702 m)   Wt 95.3 kg   SpO2 96%   BMI 32.89 kg/m   Physical Exam Vitals signs and nursing note reviewed.  Constitutional:      Appearance: He is well-developed.  HENT:     Head: Normocephalic and atraumatic.  Cardiovascular:     Rate and Rhythm: Normal rate and regular rhythm.     Heart sounds: No murmur.  Pulmonary:     Effort: Pulmonary effort is normal. No respiratory distress.     Comments: Decreased air movement  bilaterally with occasional end expiratory wheezes.   Abdominal:     Palpations: Abdomen is soft.     Tenderness: There is no abdominal tenderness. There is no guarding or rebound.  Musculoskeletal:        General: No tenderness.     Comments: Left AKA.  Mild RLE edema  Skin:    General: Skin is warm and dry.  Neurological:     Mental Status: He is alert and oriented to person, place, and time.  Psychiatric:  Mood and Affect: Mood normal.        Behavior: Behavior normal.      ED Treatments / Results  Labs (all labs ordered are listed, but only abnormal results are displayed) Labs Reviewed  CBC WITH DIFFERENTIAL/PLATELET - Abnormal; Notable for the following components:      Result Value   RDW 15.7 (*)    Platelets 149 (*)    All other components within normal limits  COMPREHENSIVE METABOLIC PANEL - Abnormal; Notable for the following components:   Sodium 132 (*)    Glucose, Bld 149 (*)    BUN 31 (*)    Creatinine, Ser 2.23 (*)    Calcium 8.4 (*)    Albumin 2.8 (*)    GFR calc non Af Amer 27 (*)    GFR calc Af Amer 32 (*)    All other components within normal limits  D-DIMER, QUANTITATIVE (NOT AT [facility]) - Abnormal; Notable for the following components:   D-Dimer, Quant 2.05 (*)    All other components within normal limits  SARS CORONAVIRUS 2 (HOSPITAL ORDER, [facility])    EKG EKG Interpretation  Date/Time:  Monday September 18 2018 17:28:58 EDT Ventricular Rate:  68 PR Interval:    QRS Duration: 143 QT Interval:  421 QTC Calculation: 448 R Axis:   -59 Text Interpretation:  Sinus rhythm Paired ventricular premature complexes Left bundle branch block Confirmed by Quintella Reichert [phone]) on 09/18/2018 5:34:07 PM   Radiology Dg Chest Port 1 View  Result Date: 09/18/2018 CLINICAL DATA:  Shortness of breath. EXAM: PORTABLE CHEST 1 VIEW COMPARISON:  12/05/2013 FINDINGS: Cardiomediastinal silhouette is normal. Mediastinal contours appear  intact. Calcific atherosclerotic disease of the aorta. There is no evidence of focal airspace consolidation, pleural effusion or pneumothorax. Atelectasis versus peribronchial airspace consolidation in bilateral lung bases. Osseous structures are without acute abnormality. Soft tissues are grossly normal. IMPRESSION: Atelectasis versus peribronchial airspace consolidation in bilateral lung bases. Electronically Signed   By: Fidela Salisbury M.D.   On: 09/18/2018 19:06    Procedures Procedures (including critical care time)  Medications Ordered in ED Medications  predniSONE (DELTASONE) tablet 40 mg (40 mg Oral Given 09/18/18 1828)  albuterol (VENTOLIN HFA) 108 (90 Base) MCG/ACT inhaler 4 puff (4 puffs Inhalation Given 09/18/18 1827)  technetium TC 40M diethylenetriame-pentaacetic acid (DTPA) injection 10.6 millicurie (26.9 millicuries Inhalation Given 09/19/18 0045)  technetium albumin aggregated (MAA) injection solution 4.85 millicurie (4.62 millicuries Intravenous Contrast Given 09/19/18 0015)     Initial Impression / Assessment and Plan / ED Course  I have reviewed the triage vital signs and the nursing notes.  Pertinent labs & imaging results that were available during my care of the patient were reviewed by me and considered in my medical decision making (see chart for details).        Patient here for evaluation of dyspnea, mildly hypoxic prior to ED arrival, no hypoxia in the emergency department. He does have mildly diminished breath sounds on exam with no respiratory distress. He was treated with albuterol, prednisone for possible reactive airway. Given his history of DVT/PE and no longer on anticoagulation a D dimer was obtained. D dimer was elevated. He is not a candidate for CTA given his chronic renal insufficiency. Patient care transferred pending VQ scan to rule out PE. If studies are negative for PE anticipate discharge home on prednisone, albuterol.    Final Clinical  Impressions(s) / ED Diagnoses   Final diagnoses:  None  ED Discharge Orders    None       Quintella Reichert, MD 09/19/18 [phone]

## 2018-09-19 ENCOUNTER — Emergency Department (HOSPITAL_COMMUNITY): Payer: Medicare HMO

## 2018-09-19 DIAGNOSIS — R0602 Shortness of breath: Secondary | ICD-10-CM | POA: Diagnosis not present

## 2018-09-19 MED ORDER — PREDNISONE 10 MG PO TABS: 40.0000 mg | ORAL_TABLET | Freq: Every day | ORAL | 0 refills | Status: DC

## 2018-09-19 MED ORDER — TECHNETIUM TC 99M DIETHYLENETRIAME-PENTAACETIC ACID
30.7000 | Freq: Once | INTRAVENOUS | Status: AC | PRN
  Administered 2018-09-19: 30.7 via RESPIRATORY_TRACT

## 2018-09-19 MED ORDER — TECHNETIUM TO 99M ALBUMIN AGGREGATED
1.5700 | Freq: Once | INTRAVENOUS | Status: AC | PRN
  Administered 2018-09-19: 1.57 via INTRAVENOUS

## 2018-09-19 NOTE — Discharge Instructions (Addendum)
You can use the albuterol inhaler, 2 puffs every 4 to 6 hours as needed for wheezing or difficulty breathing.

## 2018-09-19 NOTE — ED Notes (Signed)
Patient verbalizes understanding of discharge instructions. Opportunity for questioning and answers were provided. Armband removed by staff, pt discharged from ED.  

## 2018-09-19 NOTE — ED Notes (Signed)
Pt transferred to Nuclear Med for V/Q Scan.

## 2020-01-20 ENCOUNTER — Encounter (HOSPITAL_COMMUNITY): Payer: Self-pay | Admitting: Emergency Medicine

## 2020-01-20 ENCOUNTER — Emergency Department (HOSPITAL_BASED_OUTPATIENT_CLINIC_OR_DEPARTMENT_OTHER): Payer: Medicare HMO

## 2020-01-20 ENCOUNTER — Other Ambulatory Visit: Payer: Self-pay

## 2020-01-20 ENCOUNTER — Emergency Department (HOSPITAL_COMMUNITY)
Admission: EM | Admit: 2020-01-20 | Discharge: 2020-01-21 | Disposition: A | Discharge status: Home / Self Care | Payer: Medicare HMO | Attending: Emergency Medicine | Admitting: Emergency Medicine

## 2020-01-20 ENCOUNTER — Emergency Department (HOSPITAL_COMMUNITY): Payer: Medicare HMO

## 2020-01-20 DIAGNOSIS — Z87891 Personal history of nicotine dependence: Secondary | ICD-10-CM | POA: Insufficient documentation

## 2020-01-20 DIAGNOSIS — Z79899 Other long term (current) drug therapy: Secondary | ICD-10-CM | POA: Insufficient documentation

## 2020-01-20 DIAGNOSIS — L538 Other specified erythematous conditions: Secondary | ICD-10-CM | POA: Diagnosis not present

## 2020-01-20 DIAGNOSIS — M7989 Other specified soft tissue disorders: Secondary | ICD-10-CM | POA: Diagnosis not present

## 2020-01-20 DIAGNOSIS — L03115 Cellulitis of right lower limb: Secondary | ICD-10-CM | POA: Insufficient documentation

## 2020-01-20 DIAGNOSIS — I5020 Unspecified systolic (congestive) heart failure: Secondary | ICD-10-CM | POA: Diagnosis not present

## 2020-01-20 DIAGNOSIS — I110 Hypertensive heart disease with heart failure: Secondary | ICD-10-CM | POA: Insufficient documentation

## 2020-01-20 DIAGNOSIS — Z20822 Contact with and (suspected) exposure to covid-19: Secondary | ICD-10-CM | POA: Diagnosis not present

## 2020-01-20 DIAGNOSIS — N3000 Acute cystitis without hematuria: Secondary | ICD-10-CM

## 2020-01-20 DIAGNOSIS — M79661 Pain in right lower leg: Secondary | ICD-10-CM | POA: Diagnosis present

## 2020-01-20 DIAGNOSIS — I2510 Atherosclerotic heart disease of native coronary artery without angina pectoris: Secondary | ICD-10-CM | POA: Diagnosis not present

## 2020-01-20 LAB — BASIC METABOLIC PANEL
Anion gap: 8 (ref 5–15)
BUN: 23 mg/dL (ref 8–23)
CO2: 23 mmol/L (ref 22–32)
Calcium: 8.5 mg/dL — ABNORMAL LOW (ref 8.9–10.3)
Chloride: 105 mmol/L (ref 98–111)
Creatinine, Ser: 1.89 mg/dL — ABNORMAL HIGH (ref 0.61–1.24)
GFR, Estimated: 35 mL/min — ABNORMAL LOW (ref >60)
Glucose, Bld: 96 mg/dL (ref 70–99)
Potassium: 4.3 mmol/L (ref 3.5–5.1)
Sodium: 136 mmol/L (ref 135–145)

## 2020-01-20 LAB — CBC WITH DIFFERENTIAL/PLATELET
Abs Immature Granulocytes: 0.01 10*3/uL (ref 0.00–0.07)
Basophils Absolute: 0 10*3/uL (ref 0.0–0.1)
Basophils Relative: 0 %
Eosinophils Absolute: 0.2 10*3/uL (ref 0.0–0.5)
Eosinophils Relative: 4 %
HCT: 49.5 % (ref 39.0–52.0)
Hemoglobin: 14.7 g/dL (ref 13.0–17.0)
Immature Granulocytes: 0 %
Lymphocytes Relative: 35 %
Lymphs Abs: 1.6 10*3/uL (ref 0.7–4.0)
MCH: 24.8 pg — ABNORMAL LOW (ref 26.0–34.0)
MCHC: 29.7 g/dL — ABNORMAL LOW (ref 30.0–36.0)
MCV: 83.5 fL (ref 80.0–100.0)
Monocytes Absolute: 0.4 10*3/uL (ref 0.1–1.0)
Monocytes Relative: 9 %
Neutro Abs: 2.3 10*3/uL (ref 1.7–7.7)
Neutrophils Relative %: 52 %
Platelets: 134 10*3/uL — ABNORMAL LOW (ref 150–400)
RBC: 5.93 MIL/uL — ABNORMAL HIGH (ref 4.22–5.81)
RDW: 18.6 % — ABNORMAL HIGH (ref 11.5–15.5)
WBC: 4.5 10*3/uL (ref 4.0–10.5)
nRBC: 0 % (ref 0.0–0.2)

## 2020-01-20 LAB — URINALYSIS, ROUTINE W REFLEX MICROSCOPIC
Bilirubin Urine: NEGATIVE
Glucose, UA: NEGATIVE mg/dL
Ketones, ur: NEGATIVE mg/dL
Nitrite: NEGATIVE
Protein, ur: 100 mg/dL — AB
Specific Gravity, Urine: 1.011 (ref 1.005–1.030)
WBC, UA: >50 WBC/hpf — ABNORMAL HIGH (ref 0–5)
pH: 5 (ref 5.0–8.0)

## 2020-01-20 LAB — RESPIRATORY PANEL BY RT PCR (FLU A&B, COVID)
Influenza A by PCR: NEGATIVE
Influenza B by PCR: NEGATIVE
SARS Coronavirus 2 by RT PCR: NEGATIVE

## 2020-01-20 LAB — LACTIC ACID, PLASMA: Lactic Acid, Venous: 1.9 mmol/L (ref 0.5–1.9)

## 2020-01-20 MED ORDER — CEPHALEXIN 500 MG PO CAPS: 500.0000 mg | ORAL_CAPSULE | Freq: Two times a day (BID) | ORAL | 0 refills | Status: DC

## 2020-01-20 MED ORDER — CEPHALEXIN 250 MG PO CAPS
500.0000 mg | ORAL_CAPSULE | Freq: Once | ORAL | Status: AC
  Administered 2020-01-20: 500 mg via ORAL
  Filled 2020-01-20: qty 2

## 2020-01-20 NOTE — Discharge Instructions (Signed)
Your work-up today was negative for blood clot in the leg and or evidence of deep or bony infection.  I suspect a cellulitis causing some of the leg pain and swelling and redness.  I spoke with pharmacy and we agreed to treat you with Keflex twice a day for the next 10 days which will also cover for the urinary tract infection we discovered.  Please rest and stay hydrated.  Please follow-up with your primary doctor.  If any symptoms do not change or improve the next 2 days or begin to worsen, please return to the nearest emergency department for further evaluation management.  He would likely need IV antibiotics and admission at that time.

## 2020-01-20 NOTE — ED Notes (Signed)
Abriel Geesey son [phone] would like an update

## 2020-01-20 NOTE — ED Notes (Signed)
Kevin [name] son [phone] would like an update

## 2020-01-20 NOTE — ED Triage Notes (Signed)
Pt BIB [facility] from home. Complaint of right lower leg edema, and pain. VSS. NAD.

## 2020-01-20 NOTE — Progress Notes (Signed)
VASCULAR LAB    Right lower extremity venous duplex has been performed.  See CV proc for preliminary results.  Gave verbal report to Dr. Emelda Fear, [facility], RVT 01/20/2020, 5:56 PM

## 2020-01-20 NOTE — ED Notes (Signed)
Daughter [name] would like an update [phone]

## 2020-01-20 NOTE — Care Management (Signed)
ED CM consulted by EDP and ED RN concerning [place] need.  CM met with patient at bedside to discuss [facility] recommendations patient is agreeable. Choice offered patient states he does not have a preference. CM explained due to limitations with [facility] services, CM will send referral to [facility] and [facility] who are currently accepting [employer]. CM will place information on AVS, CM will follow up with patient 48 hours post discharge

## 2020-01-20 NOTE — ED Provider Notes (Signed)
[facility] EMERGENCY DEPARTMENT Provider Note   CSN: [id number] Arrival date & time: 01/20/20  1607     History Chief Complaint  Patient presents with  . Leg Swelling  . Leg Pain    Kevin [name] is a 79 y.o. male.  The history is provided by the patient and medical records. No language interpreter was used.  Leg Pain Location:  Leg Time since incident:  1 week Injury: no   Leg location:  R lower leg Pain details:    Quality:  Aching   Radiates to:  Does not radiate   Severity:  Moderate   Onset quality:  Gradual   Duration:  1 week   Timing:  Constant   Progression:  Unchanged Chronicity:  New Tetanus status:  Unknown Prior injury to area:  No Relieved by:  Nothing Worsened by:  Nothing Ineffective treatments:  None tried Associated symptoms: swelling   Associated symptoms: no back pain, no fatigue, no fever, no neck pain, no numbness and no tingling        Past Medical History:  Diagnosis Date  . Arthritis   . BPH (benign prostatic hyperplasia)   . CHF (congestive heart failure) ([place])    a. NICM/acute CHF 2007 with EF 10-20%. b. Improved EF 50-55% by echo 12/2011.  Marland Kitchen Coronary artery disease    a. NSTEMI 2007 with cath showing mild nonobstructive CAD by cath 2007 (40% mLAD, 40%mRCA) at time of dx of NICM.  Marland Kitchen DVT (deep venous thrombosis) ([place])   . Hyperlipidemia   . Hypertension   . Obesity   . Pulmonary embolism ([place])   . 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 Noted by cath 2007, recommendation for OP [place].  . Valvular heart disease    a. Mod MR by echo 06/2005 but no significant MR 2013.  . Wide-complex tachycardia ([place])    a. Noted 2007.    Patient Active Problem List   Diagnosis Date Noted  . Syncope 01/10/2014  . Dehydration 01/10/2014  . Bradycardia 01/10/2014  . Systolic dysfunction 63/87/5643  . Traumatic subdural hemorrhage ([place]) 12/13/2013  . Ventricular bigeminy 12/12/2013  . Subdural hematoma ([place]) 12/05/2013  . 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 Subdural hematoma without coma ([place]) 12/04/2013  . Amputee, 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  . Renal failure 01/11/2012  . Leucocytosis 01/11/2012  . DVT, femoral, acute ([place]) 01/11/2012  . DVT of axillary vein, acute ([place]) 01/04/2012  . Acute pulmonary embolism ([place]) 01/04/2012  . 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 Severe sepsis(995.92) 01/02/2012  . Septic shock(785.52) 01/02/2012  . Hypotension 01/02/2012  . 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 HTN (hypertension) 12/31/2011  . CAD (coronary artery disease) 12/31/2011  . CHF (congestive heart failure) ([place]) 12/31/2011  . Tobacco abuse 12/31/2011  . Hyperlipidemia 12/31/2011    Past Surgical History:  Procedure Laterality Date  . AMPUTATION  01/05/2012   Procedure: AMPUTATION ABOVE KNEE;  Surgeon: Rosetta Posner, MD;  Location: [facility];  Service: Vascular;  Laterality: Left;  . CRANIOTOMY Left 12/05/2013   Procedure: CRANIOTOMY HEMATOMA EVACUATION SUBDURAL- LEFT;  Surgeon: Ashok Pall, MD;  Location: [facility];  Service: Neurosurgery;  Laterality: Left;  CRANIOTOMY HEMATOMA EVACUATION SUBDURAL- LEFT  . VENA CAVA FILTER PLACEMENT  01/03/2012   Procedure: INSERTION VENA-CAVA FILTER;  Surgeon: Rosetta Posner, MD;  Location: [facility];  Service: Vascular;  Laterality: Left;       Family  History  Problem Relation Age of Onset  . Heart attack Mother        In her 28's  . Stroke Mother        In her 3's  . CAD Brother     Social History   Tobacco Use  . Smoking status: Former Research scientist (life sciences)  . Smokeless tobacco: Current User  Vaping Use  . Vaping Use: Never used  Substance Use Topics  . Alcohol use: No  . Drug use: No    Home Medications Prior to Admission medications   Medication Sig Start Date End Date Taking? Authorizing Provider  atorvastatin (LIPITOR) 20 MG tablet Take 1 tablet (20 mg total) by mouth daily. 01/29/14   Meredith Staggers, MD  atorvastatin  (LIPITOR) 40 MG tablet  07/24/18   [provider]  carvedilol (COREG) 6.25 MG tablet  01/02/14   [provider]  cefixime (SUPRAX) 400 MG tablet Take 1 tablet (400 mg total) by mouth daily. 01/02/14   Love, Ivan Anchors, PA-C  diazepam (VALIUM) 5 MG tablet  11/09/13   [provider]  hydrocerin (EUCERIN) CREA Apply 1 application topically 2 (two) times daily. To dry flaky areas 01/02/14   Love, Ivan Anchors, PA-C  levETIRAcetam (KEPPRA) 500 MG tablet Take 1 tablet (500 mg total) by mouth 2 (two) times daily. 01/02/14   LoveIvan Anchors, PA-C  oxyCODONE-acetaminophen (PERCOCET) 10-325 MG per tablet  01/03/14   [provider]  pantoprazole (PROTONIX) 40 MG tablet Take 1 tablet (40 mg total) by mouth at bedtime. 01/02/14   Love, Ivan Anchors, PA-C  predniSONE (DELTASONE) 10 MG tablet Take 4 tablets (40 mg total) by mouth daily. 09/19/18   Quintella Reichert, MD  tamsulosin (FLOMAX) 0.4 MG CAPS capsule Take 1 capsule (0.4 mg total) by mouth daily. 03/11/14   Meredith Staggers, MD  traMADol (ULTRAM) 50 MG tablet Take 1 tablet (50 mg total) by mouth every 6 (six) hours as needed for moderate pain or severe pain. 01/02/14   LovePecolia Ades  VENTOLIN HFA 108 [phone] Base) MCG/ACT inhaler  09/01/18   [provider]    Allergies    Patient has no known allergies.  Review of Systems   Review of Systems  Constitutional: Negative for chills, diaphoresis, fatigue and fever.  HENT: Negative for congestion.   Respiratory: Negative for cough, chest tightness, shortness of breath and wheezing.   Cardiovascular: Positive for leg swelling. Negative for chest pain and palpitations.  Gastrointestinal: Negative for abdominal pain, constipation, diarrhea, nausea and vomiting.  Genitourinary: Negative for dysuria, flank pain and frequency.  Musculoskeletal: Negative for back pain, neck pain and neck stiffness.  Skin: Positive for color change and rash. Negative for wound.    Neurological: Negative for light-headedness, numbness and headaches.  Psychiatric/Behavioral: Negative for agitation and confusion.  All other systems reviewed and are negative.   Physical Exam Updated Vital Signs BP 127/71 (BP Location: Right Arm)   Pulse 78   Temp 98.9 F (37.2 C) (Oral)   Resp 15   Ht 5\' 7"  (1.702 m)   Wt 99.8 kg   SpO2 100%   BMI 34.46 kg/m   Physical Exam Vitals and nursing note reviewed.  Constitutional:      General: He is not in acute distress.    Appearance: He is well-developed. He is not ill-appearing, toxic-appearing or diaphoretic.  HENT:     Head: Normocephalic and atraumatic.     Nose: Nose normal.  Mouth/Throat:     Mouth: Mucous membranes are moist.     Pharynx: No oropharyngeal exudate or posterior oropharyngeal erythema.  Eyes:     Extraocular Movements: Extraocular movements intact.     Conjunctiva/sclera: Conjunctivae normal.     Pupils: Pupils are equal, round, and reactive to light.  Cardiovascular:     Rate and Rhythm: Normal rate and regular rhythm.     Heart sounds: No murmur heard.   Pulmonary:     Effort: Pulmonary effort is normal. No respiratory distress.     Breath sounds: Normal breath sounds.  Abdominal:     General: Abdomen is flat.     Palpations: Abdomen is soft.     Tenderness: There is no abdominal tenderness. There is no right CVA tenderness, left CVA tenderness, guarding or rebound.  Musculoskeletal:        General: Swelling and tenderness present.     Cervical back: Neck supple.       Legs:  Skin:    General: Skin is warm and dry.     Capillary Refill: Capillary refill takes less than 2 seconds.     Findings: Erythema and rash present.  Neurological:     General: No focal deficit present.     Mental Status: He is alert.  Psychiatric:        Mood and Affect: Mood normal.        ED Results / Procedures / Treatments   Labs (all labs ordered are listed, but only abnormal results are  displayed) Labs Reviewed  CBC WITH DIFFERENTIAL/PLATELET - Abnormal; Notable for the following components:      Result Value   RBC 5.93 (*)    MCH 24.8 (*)    MCHC 29.7 (*)    RDW 18.6 (*)    Platelets 134 (*)    All other components within normal limits  BASIC METABOLIC PANEL - Abnormal; Notable for the following components:   Creatinine, Ser 1.89 (*)    Calcium 8.5 (*)    GFR, Estimated 35 (*)    All other components within normal limits  URINALYSIS, ROUTINE W REFLEX MICROSCOPIC - Abnormal; Notable for the following components:   APPearance HAZY (*)    Hgb urine dipstick SMALL (*)    Protein, ur 100 (*)    Leukocytes,Ua LARGE (*)    WBC, UA >50 (*)    Bacteria, UA MANY (*)    All other components within normal limits  RESPIRATORY PANEL BY RT PCR (FLU A&B, COVID)  URINE CULTURE  LACTIC ACID, PLASMA    EKG None  Radiology DG Tibia/Fibula Right  Result Date: 01/20/2020 CLINICAL DATA:  Leg pain and swelling. EXAM: RIGHT TIBIA AND FIBULA - 2 VIEW COMPARISON:  None. FINDINGS: There is nonspecific lower extremity edema. There are no pockets of subcutaneous gas. There are degenerative changes of the knee. There is no radiopaque foreign body. Vascular calcifications are noted. There are advanced degenerative changes of the ankle mortise. IMPRESSION: 1. No acute displaced fracture or dislocation. 2. Nonspecific lower extremity edema. 3. Advanced degenerative changes of the ankle mortise. Electronically Signed   By: Constance Holster M.D.   On: 01/20/2020 18:59   DG Foot Complete Right  Result Date: 01/20/2020 CLINICAL DATA:  Pain swelling EXAM: RIGHT FOOT COMPLETE - 3+ VIEW COMPARISON:  None. FINDINGS: There is soft tissue swelling which is nonspecific. There are degenerative changes of the first metatarsophalangeal joint. There is no acute displaced fracture. There are advanced degenerative changes  of the ankle mortise. There are degenerative changes of the midfoot. There is a small  plantar calcaneal spur. IMPRESSION: 1. No acute displaced fracture or dislocation. 2. Degenerative changes as above. 3. Nonspecific soft tissue swelling about the foot. Electronically Signed   By: Constance Holster M.D.   On: 01/20/2020 18:59   VAS [place] LOWER EXTREMITY VENOUS (DVT) (ONLY [facility] & [facility])  Result Date: 01/20/2020  Lower Venous DVT Study Indications: Swelling, and Erythema.  Risk Factors: DVT extensive DVT in RLE in 2013, IVC filter placed Left BKA, 2013. Limitations: Edema. Comparison Study: Prior study from 01/02/2012, is available for comparison Performing Technologist: Sharion Dove RVS  Examination Guidelines: A complete evaluation includes B-mode imaging, spectral Doppler, color Doppler, and power Doppler as needed of all accessible portions of each vessel. Bilateral testing is considered an integral part of a complete examination. Limited examinations for reoccurring indications may be performed as noted. The reflux portion of the exam is performed with the patient in reverse Trendelenburg.  +---------+---------------+---------+-----------+----------+--------------+ RIGHT    CompressibilityPhasicitySpontaneityPropertiesThrombus Aging +---------+---------------+---------+-----------+----------+--------------+ CFV      Full           Yes      Yes                                 +---------+---------------+---------+-----------+----------+--------------+ SFJ      Full                                                        +---------+---------------+---------+-----------+----------+--------------+ FV Prox  Full                                                        +---------+---------------+---------+-----------+----------+--------------+ FV Mid   Full                                                        +---------+---------------+---------+-----------+----------+--------------+ FV DistalFull                                                         +---------+---------------+---------+-----------+----------+--------------+ PFV      Full                                                        +---------+---------------+---------+-----------+----------+--------------+ POP      Full           Yes      Yes                                 +---------+---------------+---------+-----------+----------+--------------+  PTV      Full                                                        +---------+---------------+---------+-----------+----------+--------------+ PERO     Full                                                        +---------+---------------+---------+-----------+----------+--------------+   Left Technical Findings: Left leg not evaluated.   Summary: RIGHT: - Findings appear improved from previous examination. - There is no evidence of deep vein thrombosis in the lower extremity.   *See table(s) above for measurements and observations. Electronically signed by Servando Snare MD on 01/20/2020 at 8:33:59 PM.    Final     Procedures Procedures (including critical care time)  Medications Ordered in ED Medications  cephALEXin (KEFLEX) capsule 500 mg (500 mg Oral Given 01/20/20 2333)    ED Course  I have reviewed the triage vital signs and the nursing notes.  Pertinent labs & imaging results that were available during my care of the patient were reviewed by me and considered in my medical decision making (see chart for details).    MDM Rules/Calculators/A&P                          Kevin [name] is a 79 y.o. male with a past medical history significant for DVT and PE not on anticoagulation due to prior subdural hematoma status post craniotomy, CAD, CHF, hypertension, hyperlipidemia, prior ischemic leg, IVC filter placement, and left AKA who presents with right leg pain, swelling, and erythema.  Patient reports that for the last week, he is having worsened swelling and redness of the right leg.  He reports that he has  chronic neuropathies but is having some more pain in the right leg.  He is unsure if this feels more like ischemia, infection, or blood clot.  He says that he is not on any blood thinners and chart review shows that he has had intracranial hemorrhage in the past.  He says that he has not had any fevers, chills, chest pain, shortness of breath, nausea, vomiting, constipation, diarrhea, or urinary changes.  He denies any lightheadedness or syncope.  He reports that he is only having pain in his right leg and 1 to make sure there is not something serious going on.  On exam, patient does have a DP and PT pulse that was detected with Doppler ultrasound.  It was not palpable on the initial exam.  He did have delayed capillary refill and diffuse erythema.  There are wounds and ulcers all over the leg.  He is able to wiggle his toes and has sensation in the foot.  There is edema.  He reports it is not significantly tender due to his neuropathies.  Lungs clear and chest nontender.  Abdomen nontender, back nontender.  Patient resting comfortably.  Clinically given his history of DVT not on blood thinners, and most concerned about a clot.  I am also concerned about cellulitis given the erythema and wounds.  We will get screening labs to look for sepsis and  infection.  We will get ultrasound.  I will get x-rays to look for subcutaneous gas such as necrotizing fasciitis or osteomyelitis.  As we have detected the pulses, will hold on any further arterial imaging at this time.  Anticipate reassessment after work-up.  5:23 PM Ultrasound tech reports there is no evidence of DVT and he also had arterial flow on her ultrasound.  She did say that she witnessed the patient have very foul-smelling and dark urine.  We will send a urinalysis and culture make sure he is not having UTI as well.  11:00 PM Patient's work-up is returned and does show evidence of possible UTI.  He has leukocytes, greater than 50 whites, and many  bacteria.  Patient's white count is not elevated and lactic acid not elevated.  His labs are similar to prior.  X-ray shows no osteomyelitis or subcutaneous gas.  Suspect cellulitis in the right leg.  He has not failed outpatient antibiotics and he is amenable to trying several days of antibiotics and good return precautions if symptoms worsen.  I spoke with pharmacy who agreed with Keflex 5 mg every 12 hours to treat both the urine and the cellulitis.    Patient's family requested case management social work evaluation.  They will see the patient and help arrange home health to see him tomorrow for further management and help with his cellulitis.  Patient agrees with plan of care and was discharged in good condition.   Final Clinical Impression(s) / ED Diagnoses Final diagnoses:  Cellulitis of right lower extremity  Acute cystitis without hematuria    Rx / DC Orders ED Discharge Orders         Ordered    cephALEXin (KEFLEX) 500 MG capsule  2 times daily        01/20/20 2319         Clinical Impression: 1. Cellulitis of right lower extremity   2. Acute cystitis without hematuria     Disposition: Discharge  Condition: Good  I have discussed the results, Dx and Tx plan with the pt(& family if present). He/she/they expressed understanding and agree(s) with the plan. Discharge instructions discussed at great length. Strict return precautions discussed and pt &/or family have verbalized understanding of the instructions. No further questions at time of discharge.    New Prescriptions   CEPHALEXIN (KEFLEX) 500 MG CAPSULE    Take 1 capsule (500 mg total) by mouth 2 (two) times daily.    Follow Up: [facility] [address] [place] [address] [phone]  Follow up [place]. RN, Physical [place], Occupational Therapy, and [facility] will contact you to set up services 24 -48 hours after discharge. If you do not receive a call please call to  inquire  [facility] [address] [place] [address] [phone]  Follow up [place] the RN will contact you to set up services 24 -48 hours after discharge. If you do not receive a call please call to inquire   [facility] [address] [id number] [place] [phone]   \    Anyia Gierke, Gwenyth Allegra, MD 01/20/20 [phone]

## 2020-01-20 NOTE — ED Notes (Addendum)
Family updated, pts son report unable to take care of pt at home states his leg wound is unable to heal properly because he is unable to take care of his daily ADLs. Provider notifed. Social worker to be notified.

## 2020-01-21 NOTE — ED Notes (Signed)
pts son eta is 45 min

## 2020-01-21 NOTE — ED Notes (Signed)
Pericare provided 

## 2020-01-23 LAB — URINE CULTURE: Culture: >=100000 — AB

## 2020-01-24 ENCOUNTER — Telehealth: Payer: Self-pay

## 2020-01-24 NOTE — Telephone Encounter (Signed)
Post ED Visit - Positive Culture Follow-up  Culture report reviewed by antimicrobial stewardship pharmacist: [facility] Team []  Elenor Quinones, Pharm.D. []  Heide Guile, Pharm.D., BCPS AQ-ID []  Parks Neptune, Pharm.D., BCPS []  Alycia Rossetti, Pharm.D., BCPS []  [place], [place].D., BCPS, AAHIVP []  Legrand Como, Pharm.D., BCPS, AAHIVP []  Salome Arnt, PharmD, BCPS []  Johnnette Gourd, PharmD, BCPS []  Hughes Better, PharmD, BCPS []  Leeroy Cha, PharmD []  Laqueta Linden, PharmD, BCPS []  Albertina Parr, [facility] Team []  Leodis Sias, PharmD []  Lindell Spar, PharmD []  Royetta Asal, PharmD []  Graylin Shiver, Rph []  Rema Fendt) Glennon Mac, PharmD []  Arlyn Dunning, PharmD []  Netta Cedars, PharmD []  Dia Sitter, PharmD []  Leone Haven, PharmD []  Gretta Arab, PharmD []  Theodis Shove, PharmD []  Peggyann Juba, PharmD []  Reuel Boom, PharmD   Positive urine culture Treated with Cephalexin, organism sensitive to the same and no further patient follow-up is required at this time.  Genia Del 01/24/2020, 9:11 AM

## 2020-02-12 ENCOUNTER — Emergency Department (HOSPITAL_COMMUNITY): Payer: Medicare HMO

## 2020-02-12 ENCOUNTER — Emergency Department (HOSPITAL_COMMUNITY)
Admission: EM | Admit: 2020-02-12 | Discharge: 2020-02-16 | Disposition: A | Discharge status: Skilled Nursing Facility | Payer: Medicare HMO | Attending: Emergency Medicine | Admitting: Emergency Medicine

## 2020-02-12 ENCOUNTER — Other Ambulatory Visit: Payer: Self-pay

## 2020-02-12 DIAGNOSIS — W01198A Fall on same level from slipping, tripping and stumbling with subsequent striking against other object, initial encounter: Secondary | ICD-10-CM | POA: Diagnosis not present

## 2020-02-12 DIAGNOSIS — I110 Hypertensive heart disease with heart failure: Secondary | ICD-10-CM | POA: Insufficient documentation

## 2020-02-12 DIAGNOSIS — R0781 Pleurodynia: Secondary | ICD-10-CM | POA: Insufficient documentation

## 2020-02-12 DIAGNOSIS — I2510 Atherosclerotic heart disease of native coronary artery without angina pectoris: Secondary | ICD-10-CM | POA: Insufficient documentation

## 2020-02-12 DIAGNOSIS — R10812 Left upper quadrant abdominal tenderness: Secondary | ICD-10-CM | POA: Insufficient documentation

## 2020-02-12 DIAGNOSIS — Z79899 Other long term (current) drug therapy: Secondary | ICD-10-CM | POA: Diagnosis not present

## 2020-02-12 DIAGNOSIS — R262 Difficulty in walking, not elsewhere classified: Secondary | ICD-10-CM | POA: Diagnosis not present

## 2020-02-12 DIAGNOSIS — M6281 Muscle weakness (generalized): Secondary | ICD-10-CM | POA: Insufficient documentation

## 2020-02-12 DIAGNOSIS — I10 Essential (primary) hypertension: Secondary | ICD-10-CM | POA: Insufficient documentation

## 2020-02-12 DIAGNOSIS — W19XXXA Unspecified fall, initial encounter: Secondary | ICD-10-CM

## 2020-02-12 DIAGNOSIS — Z87891 Personal history of nicotine dependence: Secondary | ICD-10-CM | POA: Insufficient documentation

## 2020-02-12 DIAGNOSIS — I714 Abdominal aortic aneurysm, without rupture, unspecified: Secondary | ICD-10-CM

## 2020-02-12 DIAGNOSIS — Z9181 History of falling: Secondary | ICD-10-CM | POA: Diagnosis not present

## 2020-02-12 DIAGNOSIS — I5020 Unspecified systolic (congestive) heart failure: Secondary | ICD-10-CM | POA: Insufficient documentation

## 2020-02-12 DIAGNOSIS — R2681 Unsteadiness on feet: Secondary | ICD-10-CM | POA: Diagnosis not present

## 2020-02-12 DIAGNOSIS — I509 Heart failure, unspecified: Secondary | ICD-10-CM | POA: Insufficient documentation

## 2020-02-12 LAB — COMPREHENSIVE METABOLIC PANEL
ALT: 12 U/L (ref 0–44)
AST: 14 U/L — ABNORMAL LOW (ref 15–41)
Albumin: 3.1 g/dL — ABNORMAL LOW (ref 3.5–5.0)
Alkaline Phosphatase: 79 U/L (ref 38–126)
Anion gap: 8 (ref 5–15)
BUN: 36 mg/dL — ABNORMAL HIGH (ref 8–23)
CO2: 25 mmol/L (ref 22–32)
Calcium: 8.7 mg/dL — ABNORMAL LOW (ref 8.9–10.3)
Chloride: 107 mmol/L (ref 98–111)
Creatinine, Ser: 2.17 mg/dL — ABNORMAL HIGH (ref 0.61–1.24)
GFR, Estimated: 30 mL/min — ABNORMAL LOW (ref >60)
Glucose, Bld: 102 mg/dL — ABNORMAL HIGH (ref 70–99)
Potassium: 4.3 mmol/L (ref 3.5–5.1)
Sodium: 140 mmol/L (ref 135–145)
Total Bilirubin: 0.8 mg/dL (ref 0.3–1.2)
Total Protein: 6.9 g/dL (ref 6.5–8.1)

## 2020-02-12 LAB — CBC WITH DIFFERENTIAL/PLATELET
Abs Immature Granulocytes: 0.01 10*3/uL (ref 0.00–0.07)
Basophils Absolute: 0 10*3/uL (ref 0.0–0.1)
Basophils Relative: 1 %
Eosinophils Absolute: 0.3 10*3/uL (ref 0.0–0.5)
Eosinophils Relative: 5 %
HCT: 49.2 % (ref 39.0–52.0)
Hemoglobin: 14.2 g/dL (ref 13.0–17.0)
Immature Granulocytes: 0 %
Lymphocytes Relative: 33 %
Lymphs Abs: 1.8 10*3/uL (ref 0.7–4.0)
MCH: 24.7 pg — ABNORMAL LOW (ref 26.0–34.0)
MCHC: 28.9 g/dL — ABNORMAL LOW (ref 30.0–36.0)
MCV: 85.4 fL (ref 80.0–100.0)
Monocytes Absolute: 0.5 10*3/uL (ref 0.1–1.0)
Monocytes Relative: 9 %
Neutro Abs: 3 10*3/uL (ref 1.7–7.7)
Neutrophils Relative %: 52 %
Platelets: 138 10*3/uL — ABNORMAL LOW (ref 150–400)
RBC: 5.76 MIL/uL (ref 4.22–5.81)
RDW: 19.2 % — ABNORMAL HIGH (ref 11.5–15.5)
WBC: 5.6 10*3/uL (ref 4.0–10.5)
nRBC: 0 % (ref 0.0–0.2)

## 2020-02-12 LAB — I-STAT CHEM 8, ED
BUN: 38 mg/dL — ABNORMAL HIGH (ref 8–23)
Calcium, Ion: 1.11 mmol/L — ABNORMAL LOW (ref 1.15–1.40)
Chloride: 107 mmol/L (ref 98–111)
Creatinine, Ser: 2.1 mg/dL — ABNORMAL HIGH (ref 0.61–1.24)
Glucose, Bld: 98 mg/dL (ref 70–99)
HCT: 48 % (ref 39.0–52.0)
Hemoglobin: 16.3 g/dL (ref 13.0–17.0)
Potassium: 4.2 mmol/L (ref 3.5–5.1)
Sodium: 142 mmol/L (ref 135–145)
TCO2: 23 mmol/L (ref 22–32)

## 2020-02-12 MED ORDER — FENTANYL CITRATE (PF) 100 MCG/2ML IJ SOLN
50.0000 ug | Freq: Once | INTRAMUSCULAR | Status: AC
  Administered 2020-02-12: 50 ug via INTRAVENOUS
  Filled 2020-02-12: qty 2

## 2020-02-12 NOTE — ED Notes (Signed)
Spoke to pt's son regarding discharge. Pt sons states it is unsafe for him to be taken care of at home. Per son pt is requiring total care at home. Son reports he hired a Actuary during the day with him, but is requesting rehab.

## 2020-02-12 NOTE — Discharge Instructions (Signed)
Your CT scans today did not show any traumatic injuries.  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  You need to follow-up with Dr. Scot Dock from vascular surgery  Follow-up with your primary care doctor as well  Return to ER if you have worse abdominal pain, chest pain, trouble breathing, fever.

## 2020-02-12 NOTE — ED Triage Notes (Addendum)
Pt to ED for fall that occurred 1 hr before call to EMS.Son placed pt back in wheelchair. Friend came to visit pt and pt starting to complain of pain from fall.  Pt attempting to get of commode and into wheelchair. Pt reports 8/10 rib pain.  Denies head pain denies LOC denies hitting head  Pt alert and disoriented to time

## 2020-02-12 NOTE — ED Notes (Signed)
Patient transported to CT 

## 2020-02-12 NOTE — ED Provider Notes (Signed)
[facility] EMERGENCY DEPARTMENT Provider Note   CSN: [id number] Arrival date & time: 02/12/20  1734     History Chief Complaint  Patient presents with  . Weakness    Kevin [name] is a 79 y.o. male hx of CHF, HL, HTN, here presenting with fall and left rib pain.  Patient states that he was trying to transfer from commode to the wheelchair and slipped and fell onto the left ribs.  He was complaining of left lower rib pain.  Adamantly denies any head trauma or loss of consciousness.  He was helped to get up afterwards.  A friend came and visited him and he was noted to have a lot of pain so EMS was called.  Patient Denies being on blood thinners patient has a known left AKA.  At that home by himself and he states that he has some help at home.  He states that he does not want to be in a nursing home or assisted living  The history is provided by the patient.       Past Medical History:  Diagnosis Date  . Arthritis   . BPH (benign prostatic hyperplasia)   . CHF (congestive heart failure) ([place])    a. NICM/acute CHF 2007 with EF 10-20%. b. Improved EF 50-55% by echo 12/2011.  Marland Kitchen Coronary artery disease    a. NSTEMI 2007 with cath showing mild nonobstructive CAD by cath 2007 (40% mLAD, 40%mRCA) at time of dx of NICM.  Marland Kitchen DVT (deep venous thrombosis) ([place])   . Hyperlipidemia   . Hypertension   . Obesity   . Pulmonary embolism ([place])   . 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 Noted by cath 2007, recommendation for OP [place].  . Valvular heart disease    a. Mod MR by echo 06/2005 but no significant MR 2013.  . Wide-complex tachycardia ([place])    a. Noted 2007.    Patient Active Problem List   Diagnosis Date Noted  . Syncope 01/10/2014  . Dehydration 01/10/2014  . Bradycardia 01/10/2014  . Systolic dysfunction 95/63/8756  . Traumatic subdural hemorrhage ([place]) 12/13/2013  . Ventricular bigeminy 12/12/2013  . Subdural hematoma ([place]) 12/05/2013  . Chronic subdural hematoma  ([place]) 12/05/2013  . Subdural hematoma without coma ([place]) 12/04/2013  . Amputee, 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  . Renal failure 01/11/2012  . Leucocytosis 01/11/2012  . DVT, femoral, acute ([place]) 01/11/2012  . DVT of axillary vein, acute ([place]) 01/04/2012  . Acute pulmonary embolism ([place]) 01/04/2012  . 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 Severe sepsis(995.92) 01/02/2012  . Septic shock(785.52) 01/02/2012  . Hypotension 01/02/2012  . 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 HTN (hypertension) 12/31/2011  . CAD (coronary artery disease) 12/31/2011  . CHF (congestive heart failure) ([place]) 12/31/2011  . Tobacco abuse 12/31/2011  . Hyperlipidemia 12/31/2011    Past Surgical History:  Procedure Laterality Date  . AMPUTATION  01/05/2012   Procedure: AMPUTATION ABOVE KNEE;  Surgeon: Rosetta Posner, MD;  Location: [facility];  Service: Vascular;  Laterality: Left;  . CRANIOTOMY Left 12/05/2013   Procedure: CRANIOTOMY HEMATOMA EVACUATION SUBDURAL- LEFT;  Surgeon: Ashok Pall, MD;  Location: [facility];  Service: Neurosurgery;  Laterality: Left;  CRANIOTOMY HEMATOMA EVACUATION SUBDURAL- LEFT  . VENA CAVA FILTER PLACEMENT  01/03/2012   Procedure: INSERTION VENA-CAVA FILTER;  Surgeon: Rosetta Posner, MD;  Location: [facility];  Service: Vascular;  Laterality: Left;  Family History  Problem Relation Age of Onset  . Heart attack Mother        In her 86's  . Stroke Mother        In her 28's  . CAD Brother     Social History   Tobacco Use  . Smoking status: Former Research scientist (life sciences)  . Smokeless tobacco: Current User  Vaping Use  . Vaping Use: Never used  Substance Use Topics  . Alcohol use: No  . Drug use: No    Home Medications Prior to Admission medications   Medication Sig Start Date End Date Taking? Authorizing Provider  atorvastatin (LIPITOR) 20 MG tablet Take 1 tablet (20 mg total) by mouth daily. 01/29/14   Meredith Staggers, MD  atorvastatin (LIPITOR) 40 MG  tablet  07/24/18   [provider]  carvedilol (COREG) 6.25 MG tablet  01/02/14   [provider]  cefixime (SUPRAX) 400 MG tablet Take 1 tablet (400 mg total) by mouth daily. 01/02/14   Love, Ivan Anchors, PA-C  cephALEXin (KEFLEX) 500 MG capsule Take 1 capsule (500 mg total) by mouth 2 (two) times daily. 01/20/20   Tegeler, Gwenyth Allegra, MD  diazepam (VALIUM) 5 MG tablet  11/09/13   [provider]  hydrocerin (EUCERIN) CREA Apply 1 application topically 2 (two) times daily. To dry flaky areas 01/02/14   Love, Ivan Anchors, PA-C  levETIRAcetam (KEPPRA) 500 MG tablet Take 1 tablet (500 mg total) by mouth 2 (two) times daily. 01/02/14   LoveIvan Anchors, PA-C  oxyCODONE-acetaminophen (PERCOCET) 10-325 MG per tablet  01/03/14   [provider]  pantoprazole (PROTONIX) 40 MG tablet Take 1 tablet (40 mg total) by mouth at bedtime. 01/02/14   Love, Ivan Anchors, PA-C  predniSONE (DELTASONE) 10 MG tablet Take 4 tablets (40 mg total) by mouth daily. 09/19/18   Quintella Reichert, MD  tamsulosin (FLOMAX) 0.4 MG CAPS capsule Take 1 capsule (0.4 mg total) by mouth daily. 03/11/14   Meredith Staggers, MD  traMADol (ULTRAM) 50 MG tablet Take 1 tablet (50 mg total) by mouth every 6 (six) hours as needed for moderate pain or severe pain. 01/02/14   LovePecolia Ades  VENTOLIN HFA 108 [phone] Base) MCG/ACT inhaler  09/01/18   [provider]    Allergies    Patient has no known allergies.  Review of Systems   Review of Systems  Neurological: Positive for weakness.  All other systems reviewed and are negative.   Physical Exam Updated Vital Signs Ht 5\' 7"  (1.702 m)   Wt 99 kg   BMI 34.18 kg/m   Physical Exam Vitals and nursing note reviewed.  Constitutional:      Comments: Uncomfortable   HENT:     Head: Normocephalic and atraumatic.     Nose: Nose normal.     Mouth/Throat:     Mouth: Mucous membranes are moist.  Eyes:     Extraocular Movements: Extraocular movements  intact.     Pupils: Pupils are equal, round, and reactive to light.  Cardiovascular:     Rate and Rhythm: Normal rate and regular rhythm.     Pulses: Normal pulses.     Heart sounds: Normal heart sounds.  Pulmonary:     Comments: Tenderness left lower ribs, no obvious ecchymosis or flail chest. Abdominal:     Comments: Mild left upper quadrant tenderness no obvious abdominal bruising  Musculoskeletal:     Cervical back: Normal range of motion.  Comments: No obvious spinal tenderness.  Patient has known L AKA   Skin:    General: Skin is warm.     Capillary Refill: Capillary refill takes less than 2 seconds.  Neurological:     Comments: ANO x2 (baseline), moving all extremities  Psychiatric:        Mood and Affect: Mood normal.        Behavior: Behavior normal.     ED Results / Procedures / Treatments   Labs (all labs ordered are listed, but only abnormal results are displayed) Labs Reviewed  CBC WITH DIFFERENTIAL/PLATELET  COMPREHENSIVE METABOLIC PANEL  I-STAT CHEM 8, ED    EKG None  Radiology No results found.  Procedures Procedures (including critical care time)  Medications Ordered in ED Medications - No data to display  ED Course  I have reviewed the triage vital signs and the nursing notes.  Pertinent labs & imaging results that were available during my care of the patient were reviewed by me and considered in my medical decision making (see chart for details).    MDM Rules/Calculators/A&P                         Kevin [name] is a 79 y.o. male with fall.  Patient hit his left chest. Has L rib tenderness and abdominal tenderness. No head injury. Will get labs, CXR, CT chest/ab/pel   8:48 PM Chest x-ray showed possible rib fractures per CT chest abdomen pelvis did not show any rib fractures or pneumothorax or hemothorax.  There is an incidental 6 cm infrarenal AAA with no obvious rupture.  His creatinine is 2.1 I am unable to get a contrast study.   However patient is not hypotensive.  I discussed case with Dr. Scot Dock from vascular surgery who will have patient follow up outpatient. Patient states that he feels fine and wants to go home.  Stable for discharge  Final Clinical Impression(s) / ED Diagnoses Final diagnoses:  None    Rx / DC Orders ED Discharge Orders    None       Drenda Freeze, MD 02/12/20 2049

## 2020-02-13 DIAGNOSIS — R0781 Pleurodynia: Secondary | ICD-10-CM | POA: Diagnosis not present

## 2020-02-13 NOTE — NC FL2 (Signed)
[place] LEVEL OF CARE SCREENING TOOL     IDENTIFICATION  Patient Name: Kevin [name] Birthdate: 11-08-1940 Sex: male Admission Date (Current Location): 02/12/2020  [facility] and [place] Number:  Herbalist and Address:  [facility], [place] [address] [place], [place] [address]      Provider Number: [id number]  Attending Physician Name and Address:  No att. providers found  Relative Name and Phone Number:       Current Level of Care: Hospital Recommended Level of Care: [place] Prior Approval Number:    Date Approved/Denied:   PASRR Number: [id number]  Discharge Plan: SNF    Current Diagnoses: Patient Active Problem List   Diagnosis Date Noted  . Syncope 01/10/2014  . Dehydration 01/10/2014  . Bradycardia 01/10/2014  . Systolic dysfunction 00/71/2197  . Traumatic subdural hemorrhage ([place]) 12/13/2013  . Ventricular bigeminy 12/12/2013  . Subdural hematoma ([place]) 12/05/2013  . Chronic subdural hematoma ([place]) 12/05/2013  . Subdural hematoma without coma ([place]) 12/04/2013  . Amputee, 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  . Renal failure 01/11/2012  . Leucocytosis 01/11/2012  . DVT, femoral, acute ([place]) 01/11/2012  . DVT of axillary vein, acute ([place]) 01/04/2012  . Acute pulmonary embolism ([place]) 01/04/2012  . 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 Severe sepsis(995.92) 01/02/2012  . Septic shock(785.52) 01/02/2012  . Hypotension 01/02/2012  . 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 HTN (hypertension) 12/31/2011  . CAD (coronary artery disease) 12/31/2011  . CHF (congestive heart failure) ([place]) 12/31/2011  . Tobacco abuse 12/31/2011  . Hyperlipidemia 12/31/2011    Orientation RESPIRATION BLADDER Height & Weight     Self, Situation, Place (Some confusion)  Normal Continent Weight: 218 lb 4.1 oz (99 kg) Height:  5\' 7"  (170.2 cm)  BEHAVIORAL SYMPTOMS/MOOD NEUROLOGICAL BOWEL  NUTRITION STATUS      Continent Diet  AMBULATORY STATUS COMMUNICATION OF NEEDS Skin   Total Care Verbally Normal                       Personal Care Assistance Level of Assistance  Bathing, Feeding, Dressing Bathing Assistance: Maximum assistance Feeding assistance: Independent Dressing Assistance: Maximum assistance     Functional Limitations Info  Sight, Hearing, Speech Sight Info: Adequate Hearing Info: Adequate Speech Info: Adequate    SPECIAL CARE FACTORS FREQUENCY  PT (By licensed PT), OT (By licensed OT)     PT Frequency: 5x weekly OT Frequency: 5x weekly            Contractures Contractures Info: Not present    Additional Factors Info                  Current Medications (02/13/2020):  This is the current hospital active medication list No current facility-administered medications for this encounter.   Current Outpatient Medications  Medication Sig Dispense Refill  . atorvastatin (LIPITOR) 20 MG tablet Take 1 tablet (20 mg total) by mouth daily. 30 tablet 0  . atorvastatin (LIPITOR) 40 MG tablet     . carvedilol (COREG) 6.25 MG tablet     . cefixime (SUPRAX) 400 MG tablet Take 1 tablet (400 mg total) by mouth daily. 4 tablet 0  . cephALEXin (KEFLEX) 500 MG capsule Take 1 capsule (500 mg total) by mouth 2 (two) times daily. 20 capsule 0  . diazepam (VALIUM) 5 MG tablet     . hydrocerin (EUCERIN) CREA Apply 1 application topically 2 (  two) times daily. To dry flaky areas 228 g 0  . levETIRAcetam (KEPPRA) 500 MG tablet Take 1 tablet (500 mg total) by mouth 2 (two) times daily. 60 tablet 1  . oxyCODONE-acetaminophen (PERCOCET) 10-325 MG per tablet     . pantoprazole (PROTONIX) 40 MG tablet Take 1 tablet (40 mg total) by mouth at bedtime. 30 tablet 0  . predniSONE (DELTASONE) 10 MG tablet Take 4 tablets (40 mg total) by mouth daily. 16 tablet 0  . tamsulosin (FLOMAX) 0.4 MG CAPS capsule Take 1 capsule (0.4 mg total) by mouth daily. 30 capsule 0  .  traMADol (ULTRAM) 50 MG tablet Take 1 tablet (50 mg total) by mouth every 6 (six) hours as needed for moderate pain or severe pain. 75 tablet 0  . VENTOLIN HFA 108 (90 Base) MCG/ACT inhaler        Discharge Medications: Please see discharge summary for a list of discharge medications.  Relevant Imaging Results:  Relevant Lab Results:   Additional Information SSN: [id number]  Archie Endo, LCSW

## 2020-02-13 NOTE — Progress Notes (Signed)
Pt evaluated and requiring mod to max A to perform bed mobility tasks this session. Feel pt is at increased risk for falls. Currently recommending SNF. Formal note to follow.   Reuel Derby, PT, DPT  Acute Rehabilitation Services  Pager: [phone] Office: [phone]

## 2020-02-13 NOTE — ED Notes (Signed)
Pt given phone to call son.  Pt with meal tray at bedside.
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
## 2020-02-13 NOTE — Discharge Planning (Signed)
RNCM notes Transitions of Care consult for skilled nursing facility (SNF) vs home with home health [facility]).  Physical Therapy (PT) consult placed. Awaiting recommendation prior to proceeding with disposition plan.

## 2020-02-13 NOTE — ED Notes (Signed)
Pt provided bagged lunch , pt currently eating in NAD at this time

## 2020-02-13 NOTE — ED Notes (Signed)
Incontinent of stool and urine.  Cleaned and bed changed

## 2020-02-13 NOTE — Social Work (Signed)
CSW spoke with daughter, Kevin [name] via phone @ [phone] to gather collateral information.  CSW was able to ascertain information about Pt's current living situation. CSW met with Pt at bedside. Pt is pleasant, oriented to self and situation, but otherwise fluctuated. Pt was able to provide little information about living arrangements.  The numbers for son, Kevin [name], with whom Pt resides are still incorrect in chart. First shift CSW notified and nursing staff will update in chart next time son calls.  Daughter Kevin [name] would like updated information when Pt is placed.

## 2020-02-14 MED ORDER — ACETAMINOPHEN 500 MG PO TABS
1000.0000 mg | ORAL_TABLET | Freq: Once | ORAL | Status: AC
  Administered 2020-02-14: 1000 mg via ORAL
  Filled 2020-02-14: qty 2

## 2020-02-14 NOTE — ED Notes (Signed)
Pt provided phone to room to talk to his son.

## 2020-02-14 NOTE — ED Notes (Signed)
Dinner delivered.

## 2020-02-14 NOTE — ED Notes (Signed)
Lunch Tray Ordered @ 1037. 

## 2020-02-14 NOTE — ED Notes (Signed)
Pt complaining of HA. Verbal order received from Margaux Venter-PA.

## 2020-02-14 NOTE — ED Notes (Signed)
Pt 's niece at bedside. SW called to come update the niece on plan.

## 2020-02-14 NOTE — ED Notes (Signed)
This nurse receieved call from pt son, pt gives verbal consent to speak with son, noted social work had been trying to reach pt son . Pt son informs this nurse he can be reached at [phone] , this nurse to notify social work of number provided.

## 2020-02-14 NOTE — Progress Notes (Addendum)
2pm: CSW spoke with patient's son [name] at [phone] - [name] states he wants to accept the bed offer from [facility] in [place].  CSW notified Archie Patten at [employer] of information.   The patient is not managed by [facility] will initiate insurance authorization.  8:30am: CSW attempted to reach patient's son [name] again at [phone] - this number is incorrect. CSW attempted to reach him at [phone] also without success. CSW attempted to reach him at [phone] also without success.  CSW attempted to reach patient's daughter Jackelyn Poling without success - a voicemail was left requesting a return call.  Madilyn Fireman, MSW, LCSW-A Transitions of Care  Clinical Social Worker I [facility] Emergency Departments  Medical ICU [phone]

## 2020-02-14 NOTE — Progress Notes (Signed)
CSW met with Pt and niece,Tina, at bedside. CSW updated niece as to process of placement and expected discharge to [facility] in the morning of 12/3.

## 2020-02-15 NOTE — ED Notes (Signed)
Breakfast Ordered 

## 2020-02-15 NOTE — ED Notes (Signed)
Resp. Even and non-labored. No distress noted. Will continue to monitor.

## 2020-02-15 NOTE — ED Notes (Signed)
Patient spoke with niece on the phone-Monique,RN

## 2020-02-15 NOTE — ED Notes (Signed)
Pt resting. Even Resp. No distress noted. Will continue to monitor.

## 2020-02-15 NOTE — ED Provider Notes (Signed)
Emergency Medicine Observation Re-evaluation Note  Kevin [name] is a 79 y.o. male, seen on rounds today.  Pt initially presented to the ED for complaints of Weakness Currently, the patient is resting in bed, has not eaten lunch, asking when he will be placed.  Physical Exam  BP (!) 148/91   Pulse 63   Temp 97.9 F (36.6 C) (Oral)   Resp 18   Ht 5\' 7"  (1.702 m)   Wt 99 kg   SpO2 98%   BMI 34.18 kg/m  Physical Exam General: alert, non toxic, no distress Lungs: respirations even and unlabored  ED Course / MDM  EKG:EKG Interpretation  Date/Time:  Tuesday February 12 2020 17:58:24 EST Ventricular Rate:  62 PR Interval:    QRS Duration: 157 QT Interval:  457 QTC Calculation: 465 R Axis:   -51 Text Interpretation: Sinus rhythm Multiple premature complexes, vent & supraven Left bundle branch block No significant change since last tracing Confirmed by Wandra Arthurs [phone]) on 02/12/2020 6:03:39 PM    I have reviewed the labs performed to date as well as medications administered while in observation.  Recent changes in the last 24 hours include placement arranged, awaiting insurance PA.  Plan  Current plan is for placement.     Tacy Learn, PA-C 02/15/20 1306    Carmin Muskrat, MD 02/15/20 1359

## 2020-02-15 NOTE — ED Notes (Signed)
Meal tray delivered, pt eating 

## 2020-02-15 NOTE — ED Notes (Signed)
Kevin [name], daughter, [phone] would like an update when available

## 2020-02-15 NOTE — Progress Notes (Addendum)
The patient's insurance authorization was received by the facility.   The patient will go to room 114. The number to call for report is [phone]. The patient will transported via [facility] called for transport.   CSW faxed patient's AVS to the facility.   Madilyn Fireman, MSW, LCSW-A Transitions of Care  Clinical Social Worker I [facility] Emergency Departments  Medical ICU [phone]

## 2020-02-15 NOTE — ED Notes (Signed)
Pts niece leaving to go home. Pt states that he is ready to go to sleep. No complaints. Will continue to monitor.

## 2020-02-15 NOTE — ED Notes (Signed)
Pt resting comfortably, resp even and unlabored.

## 2020-02-16 ENCOUNTER — Telehealth: Payer: Self-pay | Admitting: Surgery

## 2020-02-16 ENCOUNTER — Other Ambulatory Visit: Payer: Self-pay

## 2020-02-16 ENCOUNTER — Emergency Department (HOSPITAL_COMMUNITY)
Admission: EM | Admit: 2020-02-16 | Discharge: 2020-02-17 | Disposition: A | Discharge status: Home / Self Care | Payer: Medicare HMO | Source: Home / Self Care | Attending: Emergency Medicine | Admitting: Emergency Medicine

## 2020-02-16 ENCOUNTER — Encounter (HOSPITAL_COMMUNITY): Payer: Self-pay | Admitting: Emergency Medicine

## 2020-02-16 DIAGNOSIS — I2510 Atherosclerotic heart disease of native coronary artery without angina pectoris: Secondary | ICD-10-CM | POA: Insufficient documentation

## 2020-02-16 DIAGNOSIS — R0781 Pleurodynia: Secondary | ICD-10-CM | POA: Diagnosis not present

## 2020-02-16 DIAGNOSIS — I5020 Unspecified systolic (congestive) heart failure: Secondary | ICD-10-CM | POA: Insufficient documentation

## 2020-02-16 DIAGNOSIS — Z79899 Other long term (current) drug therapy: Secondary | ICD-10-CM | POA: Insufficient documentation

## 2020-02-16 DIAGNOSIS — I110 Hypertensive heart disease with heart failure: Secondary | ICD-10-CM | POA: Insufficient documentation

## 2020-02-16 DIAGNOSIS — Z87891 Personal history of nicotine dependence: Secondary | ICD-10-CM | POA: Insufficient documentation

## 2020-02-16 DIAGNOSIS — Z7189 Other specified counseling: Secondary | ICD-10-CM | POA: Insufficient documentation

## 2020-02-16 MED ORDER — ALBUTEROL SULFATE HFA 108 (90 BASE) MCG/ACT IN AERS: 1.0000 | INHALATION_SPRAY | Freq: Four times a day (QID) | RESPIRATORY_TRACT | Status: DC | PRN

## 2020-02-16 MED ORDER — CARISOPRODOL 350 MG PO TABS
350.0000 mg | ORAL_TABLET | Freq: Three times a day (TID) | ORAL | Status: DC
  Administered 2020-02-16: 350 mg via ORAL
  Filled 2020-02-16: qty 1

## 2020-02-16 MED ORDER — ATORVASTATIN CALCIUM 40 MG PO TABS: 40.0000 mg | ORAL_TABLET | Freq: Every day | ORAL | Status: DC

## 2020-02-16 MED ORDER — PANTOPRAZOLE SODIUM 40 MG PO TBEC
40.0000 mg | DELAYED_RELEASE_TABLET | Freq: Every day | ORAL | Status: DC
  Administered 2020-02-16: 40 mg via ORAL
  Filled 2020-02-16: qty 1

## 2020-02-16 MED ORDER — OXYCODONE-ACETAMINOPHEN 10-325 MG PO TABS: 1.0000 | ORAL_TABLET | Freq: Four times a day (QID) | ORAL | Status: DC | PRN

## 2020-02-16 MED ORDER — OXYCODONE-ACETAMINOPHEN 5-325 MG PO TABS
1.0000 | ORAL_TABLET | Freq: Four times a day (QID) | ORAL | Status: DC | PRN
  Administered 2020-02-16: 1 via ORAL
  Filled 2020-02-16: qty 1

## 2020-02-16 MED ORDER — TAMSULOSIN HCL 0.4 MG PO CAPS: 0.4000 mg | ORAL_CAPSULE | Freq: Every day | ORAL | Status: DC

## 2020-02-16 MED ORDER — CARVEDILOL 12.5 MG PO TABS
6.2500 mg | ORAL_TABLET | Freq: Two times a day (BID) | ORAL | Status: DC
  Administered 2020-02-17: 6.25 mg via ORAL
  Filled 2020-02-16: qty 1

## 2020-02-16 MED ORDER — OXYCODONE HCL 5 MG PO TABS: 5.0000 mg | ORAL_TABLET | Freq: Four times a day (QID) | ORAL | Status: DC | PRN

## 2020-02-16 NOTE — ED Notes (Signed)
Pulse was 95 bpm per EKG

## 2020-02-16 NOTE — Telephone Encounter (Signed)
ED RNCM received message from  ED CSW that he was contacted by Nursing Director at [facility] with questions about home medication list.  CM contacted ED Pharmacy Tech  concerning updated medication reconciliation list. Pharmacy Tech will provide the updated list. No further ED CM needs identified.

## 2020-02-16 NOTE — ED Triage Notes (Signed)
Pt to triage via PT[facility] from [facility] in [place].  Pt was discharged from hospital this morning and they called EMS because he does not have any psych meds to take.  Pt has no complaints.  Sitting in recliner in lobby.

## 2020-02-16 NOTE — ED Provider Notes (Signed)
[facility] EMERGENCY DEPARTMENT Provider Note   CSN: [id number] Arrival date & time: 02/16/20  1057     History Chief Complaint  Patient presents with  . Medication Refill    [facility] [name] is a 79 y.o. male with past medical history as listed below.  HPI Patient presents to emergency department today via PTAR with chief complaint of medication refill. Patient was discharged from the ED earlier this morning to [facility] in [place] for placement.  Triage note states : Patient was discharged multiple this morning and they called EMS because he is not having psych meds to take.  Patient has no complaints. Denies being in any pain, no URI symptoms, no cp.  Chart review shows case manager received message today from nursing director at the facility asking for home medication list. The ED pharmacy tech was contacted and provided updated list to facility.  Son is at the bedside currently  and reports he was not contacted by the facility about medications. He states he has patient's home medications at his home currently.   Past Medical History:  Diagnosis Date  . Arthritis   . BPH (benign prostatic hyperplasia)   . CHF (congestive heart failure) ([place])    a. NICM/acute CHF 2007 with EF 10-20%. b. Improved EF 50-55% by echo 12/2011.  Marland Kitchen Coronary artery disease    a. NSTEMI 2007 with cath showing mild nonobstructive CAD by cath 2007 (40% mLAD, 40%mRCA) at time of dx of NICM.  Marland Kitchen DVT (deep venous thrombosis) ([place])   . Hyperlipidemia   . Hypertension   . Obesity   . Pulmonary embolism ([place])   . 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 Noted by cath 2007, recommendation for OP [place].  . Valvular heart disease    a. Mod MR by echo 06/2005 but no significant MR 2013.  . Wide-complex tachycardia ([place])    a. Noted 2007.    Patient Active Problem List   Diagnosis Date Noted  . Syncope 01/10/2014  . Dehydration 01/10/2014  . Bradycardia 01/10/2014  . Systolic dysfunction  30/09/6224  . Traumatic subdural hemorrhage ([place]) 12/13/2013  . Ventricular bigeminy 12/12/2013  . Subdural hematoma ([place]) 12/05/2013  . 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 Subdural hematoma without coma ([place]) 12/04/2013  . Amputee, 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  . Renal failure 01/11/2012  . Leucocytosis 01/11/2012  . DVT, femoral, acute ([place]) 01/11/2012  . DVT of axillary vein, acute ([place]) 01/04/2012  . Acute pulmonary embolism ([place]) 01/04/2012  . 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 Severe sepsis(995.92) 01/02/2012  . Septic shock(785.52) 01/02/2012  . Hypotension 01/02/2012  . 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 HTN (hypertension) 12/31/2011  . CAD (coronary artery disease) 12/31/2011  . CHF (congestive heart failure) ([place]) 12/31/2011  . Tobacco abuse 12/31/2011  . Hyperlipidemia 12/31/2011    Past Surgical History:  Procedure Laterality Date  . AMPUTATION  01/05/2012   Procedure: AMPUTATION ABOVE KNEE;  Surgeon: Rosetta Posner, MD;  Location: [facility];  Service: Vascular;  Laterality: Left;  . CRANIOTOMY Left 12/05/2013   Procedure: CRANIOTOMY HEMATOMA EVACUATION SUBDURAL- LEFT;  Surgeon: Ashok Pall, MD;  Location: [facility];  Service: Neurosurgery;  Laterality: Left;  CRANIOTOMY HEMATOMA EVACUATION SUBDURAL- LEFT  . VENA CAVA FILTER PLACEMENT  01/03/2012   Procedure: INSERTION VENA-CAVA FILTER;  Surgeon: Rosetta Posner, MD;  Location: [facility];  Service: Vascular;  Laterality: Left;  Family History  Problem Relation Age of Onset  . Heart attack Mother        In her 68's  . Stroke Mother        In her 25's  . CAD Brother     Social History   Tobacco Use  . Smoking status: Former Research scientist (life sciences)  . Smokeless tobacco: Current User  Vaping Use  . Vaping Use: Never used  Substance Use Topics  . Alcohol use: No  . Drug use: No    Home Medications Prior to Admission medications   Medication Sig Start Date End Date Taking?  Authorizing Provider  atorvastatin (LIPITOR) 20 MG tablet Take 1 tablet (20 mg total) by mouth daily. Patient not taking: Reported on 02/13/2020 01/29/14   Meredith Staggers, MD  atorvastatin (LIPITOR) 40 MG tablet  07/24/18   [provider]  carisoprodol (SOMA) 350 MG tablet Take 350 mg by mouth 3 (three) times daily.    [provider]  carvedilol (COREG) 6.25 MG tablet  01/02/14   [provider]  cefixime (SUPRAX) 400 MG tablet Take 1 tablet (400 mg total) by mouth daily. Patient not taking: Reported on 02/13/2020 01/02/14   Love, Ivan Anchors, PA-C  cephALEXin (KEFLEX) 500 MG capsule Take 1 capsule (500 mg total) by mouth 2 (two) times daily. Patient not taking: Reported on 02/13/2020 01/20/20   Tegeler, Gwenyth Allegra, MD  diazepam (VALIUM) 5 MG tablet Take 5 mg by mouth daily.  11/09/13   [provider]  hydrocerin (EUCERIN) CREA Apply 1 application topically 2 (two) times daily. To dry flaky areas Patient not taking: Reported on 02/13/2020 01/02/14   Love, Ivan Anchors, PA-C  levETIRAcetam (KEPPRA) 500 MG tablet Take 1 tablet (500 mg total) by mouth 2 (two) times daily. Patient not taking: Reported on 02/13/2020 01/02/14   Bary Leriche, PA-C  oxyCODONE-acetaminophen (PERCOCET) 10-325 MG per tablet  01/03/14   [provider]  pantoprazole (PROTONIX) 40 MG tablet Take 1 tablet (40 mg total) by mouth at bedtime. 01/02/14   Love, Ivan Anchors, PA-C  predniSONE (DELTASONE) 10 MG tablet Take 4 tablets (40 mg total) by mouth daily. Patient not taking: Reported on 02/13/2020 09/19/18   Quintella Reichert, MD  tamsulosin [facility]) 0.4 MG CAPS capsule Take 1 capsule (0.4 mg total) by mouth daily. 03/11/14   Meredith Staggers, MD  traMADol (ULTRAM) 50 MG tablet Take 1 tablet (50 mg total) by mouth every 6 (six) hours as needed for moderate pain or severe pain. Patient not taking: Reported on 02/13/2020 01/02/14   Flora Lipps  VENTOLIN HFA 108 [phone]) MCG/ACT inhaler   09/01/18   [provider]    Allergies    Patient has no known allergies.  Review of Systems   Review of Systems All other systems are reviewed and are negative for acute change except as noted in the HPI.  Physical Exam Updated Vital Signs BP 121/79 (BP Location: Right Arm)   Pulse (!) 50   Temp 98.1 F (36.7 C) (Oral)   Resp 16   SpO2 98%   Physical Exam Vitals and nursing note reviewed.  Constitutional:      Appearance: He is well-developed. He is not ill-appearing or toxic-appearing.  HENT:     Head: Normocephalic and atraumatic.     Nose: Nose normal.  Eyes:     General: No scleral icterus.       Right eye: No discharge.  Left eye: No discharge.     Conjunctiva/sclera: Conjunctivae normal.  Neck:     Vascular: No JVD.  Cardiovascular:     Rate and Rhythm: Normal rate and regular rhythm.     Pulses: Normal pulses.     Heart sounds: Normal heart sounds.  Pulmonary:     Effort: Pulmonary effort is normal.     Breath sounds: Normal breath sounds.  Abdominal:     General: There is no distension.  Musculoskeletal:        General: Normal range of motion.     Cervical back: Normal range of motion.     Comments: Left BKA  Skin:    General: Skin is warm and dry.  Neurological:     Mental Status: He is oriented to person, place, and time.     GCS: GCS eye subscore is 4. GCS verbal subscore is 5. GCS motor subscore is 6.     Comments: Fluent speech, no facial droop.  Psychiatric:        Behavior: Behavior normal.     ED Results / Procedures / Treatments   Labs (all labs ordered are listed, but only abnormal results are displayed) Labs Reviewed - No data to display  EKG None  Radiology No results found.  Procedures Procedures (including critical care time)  Medications Ordered in ED Medications  pantoprazole (PROTONIX) EC tablet 40 mg (has no administration in time range)  tamsulosin (FLOMAX) capsule 0.4 mg (has no administration in  time range)  albuterol (VENTOLIN HFA) 108 (90 Base) MCG/ACT inhaler 1-2 puff (has no administration in time range)  atorvastatin (LIPITOR) tablet 40 mg (has no administration in time range)  carisoprodol (SOMA) tablet 350 mg (has no administration in time range)  carvedilol (COREG) tablet 6.25 mg (has no administration in time range)  oxyCODONE-acetaminophen (PERCOCET/ROXICET) 5-325 MG per tablet 1 tablet (has no administration in time range)    And  oxyCODONE (Oxy IR/ROXICODONE) immediate release tablet 5 mg (has no administration in time range)    ED Course  I have reviewed the triage vital signs and the nursing notes.  Pertinent labs & imaging results that were available during my care of the patient were reviewed by me and considered in my medical decision making (see chart for details).    MDM Rules/Calculators/A&P                          History provided by patient and son with additional history obtained from chart review.    79 yo male. VSS, well appearing, in no acute distress. No complaints today, here for placement/medication issues.  I contacted case management who was unaware of patient being transported to ED. I attempted to call facility as well as CM without success. Plan to board patient overnight so this can be worked out Architectural technologist. CM to follow and work on facilitating transfer back to [facility] in [place]. Home medications and diet ordered. Son agreeable with plan of care.   Portions of this note were generated with Lobbyist. Dictation errors may occur despite best attempts at proofreading.   Final Clinical Impression(s) / ED Diagnoses Final diagnoses:  Medication management    Rx / DC Orders ED Discharge Orders    None       Lewanda Rife 02/16/20 Valerie Roys, MD 02/17/20 1351

## 2020-02-16 NOTE — ED Notes (Signed)
Per RN Flonnie Hailstone PTAR was called to transport patient to facility at 2 pm on 02/15/2020

## 2020-02-17 DIAGNOSIS — R0781 Pleurodynia: Secondary | ICD-10-CM | POA: Diagnosis not present

## 2020-02-17 MED ORDER — TAMSULOSIN HCL 0.4 MG PO CAPS: 0.4000 mg | ORAL_CAPSULE | Freq: Every day | ORAL | 0 refills | Status: AC

## 2020-02-17 MED ORDER — ATORVASTATIN CALCIUM 40 MG PO TABS: 40.0000 mg | ORAL_TABLET | Freq: Every day | ORAL | 0 refills | Status: AC

## 2020-02-17 MED ORDER — VENTOLIN HFA 108 (90 BASE) MCG/ACT IN AERS
1.0000 | INHALATION_SPRAY | Freq: Four times a day (QID) | RESPIRATORY_TRACT | 0 refills | Status: AC | PRN
Start: 2020-02-17 — End: ?

## 2020-02-17 MED ORDER — PANTOPRAZOLE SODIUM 40 MG PO TBEC
40.0000 mg | DELAYED_RELEASE_TABLET | Freq: Every day | ORAL | 0 refills | Status: DC
Start: 2020-02-17 — End: 2020-11-24

## 2020-02-17 MED ORDER — CARVEDILOL 6.25 MG PO TABS
6.2500 mg | ORAL_TABLET | Freq: Two times a day (BID) | ORAL | 0 refills | Status: DC
Start: 2020-02-17 — End: 2020-02-17

## 2020-02-17 MED ORDER — CARVEDILOL 6.25 MG PO TABS: 6.2500 mg | ORAL_TABLET | Freq: Two times a day (BID) | ORAL | 0 refills | Status: AC

## 2020-02-17 MED ORDER — CARISOPRODOL 350 MG PO TABS: 350.0000 mg | ORAL_TABLET | Freq: Three times a day (TID) | ORAL | 0 refills | Status: AC

## 2020-02-17 NOTE — ED Notes (Signed)
This RN called facility pt was supposed to be at. They will be having the DON of the facility call back.

## 2020-02-17 NOTE — ED Notes (Signed)
Breakfast Ordered 

## 2020-02-17 NOTE — ED Notes (Signed)
This RN spoke with the pt, denies any needs at this time. Breakfast tray at bedside. Pt has given permission for this RN to speak with his family.

## 2020-02-17 NOTE — ED Notes (Signed)
Updated medication list faxed to facility

## 2020-02-17 NOTE — ED Notes (Signed)
This RN has attempted multiple times to contact all family members listed in patients chart to update about pt being transferred back out. Nobody is answering at the numbers listed.

## 2020-02-17 NOTE — ED Provider Notes (Signed)
Patient left for facility but when arrived at the facility he did not have paper prescriptions were an updated medication list and facility sent him back.  Patient has no complaints at this time.  It was discussed with the facility that this could have been handled over a phone call and that was inappropriate for them to send him here to receive paper prescriptions in a medication list.  These were provided for the patient and he was discharged back to the skilled facility.   Blanchie Dessert, MD 02/17/20 [phone]

## 2020-03-01 ENCOUNTER — Emergency Department (HOSPITAL_COMMUNITY)
Admission: EM | Admit: 2020-03-01 | Discharge: 2020-03-01 | Disposition: A | Discharge status: Home / Self Care | Payer: Medicare HMO | Attending: Emergency Medicine | Admitting: Emergency Medicine

## 2020-03-01 ENCOUNTER — Encounter (HOSPITAL_COMMUNITY): Payer: Self-pay

## 2020-03-01 ENCOUNTER — Emergency Department (HOSPITAL_COMMUNITY): Payer: Medicare HMO

## 2020-03-01 DIAGNOSIS — M25551 Pain in right hip: Secondary | ICD-10-CM | POA: Insufficient documentation

## 2020-03-01 DIAGNOSIS — W182XXA Fall in (into) shower or empty bathtub, initial encounter: Secondary | ICD-10-CM | POA: Insufficient documentation

## 2020-03-01 DIAGNOSIS — Y92002 Bathroom of unspecified non-institutional (private) residence single-family (private) house as the place of occurrence of the external cause: Secondary | ICD-10-CM | POA: Diagnosis not present

## 2020-03-01 DIAGNOSIS — W010XXA Fall on same level from slipping, tripping and stumbling without subsequent striking against object, initial encounter: Secondary | ICD-10-CM

## 2020-03-01 DIAGNOSIS — S300XXA Contusion of lower back and pelvis, initial encounter: Secondary | ICD-10-CM

## 2020-03-01 DIAGNOSIS — M545 Low back pain, unspecified: Secondary | ICD-10-CM | POA: Insufficient documentation

## 2020-03-01 MED ORDER — ACETAMINOPHEN 325 MG PO TABS
650.0000 mg | ORAL_TABLET | Freq: Once | ORAL | Status: AC
  Administered 2020-03-01: 11:00:00 650 mg via ORAL
  Filled 2020-03-01: qty 2

## 2020-03-01 NOTE — ED Provider Notes (Signed)
[facility] DEPT Provider Note   CSN: [id number] Arrival date & time: 03/01/20  0845     History Chief Complaint  Patient presents with  . Fall    Kevin [name] is a 79 y.o. male.  Patient s/p fall at [facility]. Patient indicates fell from bed, c/o pain to right lower back and right hip area. Symptoms acute onset, constant, dull, persistent, non radiating. Denies head injury or headache. No neck or mid to upper back pain. No radicular pain. Denies loc. Pt's mental status noted c/w baseline since fall. No nausea or vomiting. Pt denies any other pain or injury. No numbness/weakness.   The history is provided by the patient, the EMS personnel and the nursing home.  Fall Pertinent negatives include no chest pain, no abdominal pain, no headaches and no shortness of breath.       Past Medical History:  Diagnosis Date  . Arthritis   . BPH (benign prostatic hyperplasia)   . CHF (congestive heart failure) ([place])    a. NICM/acute CHF 2007 with EF 10-20%. b. Improved EF 50-55% by echo 12/2011.  Marland Kitchen Coronary artery disease    a. NSTEMI 2007 with cath showing mild nonobstructive CAD by cath 2007 (40% mLAD, 40%mRCA) at time of dx of NICM.  Marland Kitchen DVT (deep venous thrombosis) ([place])   . Hyperlipidemia   . Hypertension   . Obesity   . Pulmonary embolism ([place])   . 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 Noted by cath 2007, recommendation for OP [place].  . Valvular heart disease    a. Mod MR by echo 06/2005 but no significant MR 2013.  . Wide-complex tachycardia ([place])    a. Noted 2007.    Patient Active Problem List   Diagnosis Date Noted  . Syncope 01/10/2014  . Dehydration 01/10/2014  . Bradycardia 01/10/2014  . Systolic dysfunction 41/74/0814  . Traumatic subdural hemorrhage ([place]) 12/13/2013  . Ventricular bigeminy 12/12/2013  . Subdural hematoma ([place]) 12/05/2013  . 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 Subdural hematoma without coma ([place]) 12/04/2013  . Amputee,  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  . Renal failure 01/11/2012  . Leucocytosis 01/11/2012  . DVT, femoral, acute ([place]) 01/11/2012  . DVT of axillary vein, acute ([place]) 01/04/2012  . Acute pulmonary embolism ([place]) 01/04/2012  . 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 Severe sepsis(995.92) 01/02/2012  . Septic shock(785.52) 01/02/2012  . Hypotension 01/02/2012  . 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 HTN (hypertension) 12/31/2011  . CAD (coronary artery disease) 12/31/2011  . CHF (congestive heart failure) ([place]) 12/31/2011  . Tobacco abuse 12/31/2011  . Hyperlipidemia 12/31/2011    Past Surgical History:  Procedure Laterality Date  . AMPUTATION  01/05/2012   Procedure: AMPUTATION ABOVE KNEE;  Surgeon: Rosetta Posner, MD;  Location: [facility];  Service: Vascular;  Laterality: Left;  . CRANIOTOMY Left 12/05/2013   Procedure: CRANIOTOMY HEMATOMA EVACUATION SUBDURAL- LEFT;  Surgeon: Ashok Pall, MD;  Location: [facility];  Service: Neurosurgery;  Laterality: Left;  CRANIOTOMY HEMATOMA EVACUATION SUBDURAL- LEFT  . VENA CAVA FILTER PLACEMENT  01/03/2012   Procedure: INSERTION VENA-CAVA FILTER;  Surgeon: Rosetta Posner, MD;  Location: [facility];  Service: Vascular;  Laterality: Left;       Family History  Problem Relation Age of Onset  . Heart attack Mother        In her 64's  . Stroke Mother        In  her 60's  . CAD Brother     Social History   Tobacco Use  . Smoking status: Former Research scientist (life sciences)  . Smokeless tobacco: Current User  Vaping Use  . Vaping Use: Never used  Substance Use Topics  . Alcohol use: No  . Drug use: No    Home Medications Prior to Admission medications   Medication Sig Start Date End Date Taking? Authorizing Provider  atorvastatin (LIPITOR) 40 MG tablet Take 1 tablet (40 mg total) by mouth daily. 02/17/20   Blanchie Dessert, MD  carisoprodol (SOMA) 350 MG tablet Take 1 tablet (350 mg total) by mouth 3 (three) times daily. 02/17/20   Blanchie Dessert, MD  carvedilol (COREG) 6.25 MG tablet Take 1 tablet (6.25 mg total) by mouth 2 (two) times daily with a meal. 02/17/20   Blanchie Dessert, MD  pantoprazole (PROTONIX) 40 MG tablet Take 1 tablet (40 mg total) by mouth at bedtime. 02/17/20   Blanchie Dessert, MD  tamsulosin (FLOMAX) 0.4 MG CAPS capsule Take 1 capsule (0.4 mg total) by mouth daily after supper. 02/17/20   Blanchie Dessert, MD  VENTOLIN HFA 108 (90 Base) MCG/ACT inhaler Inhale 1-2 puffs into the lungs every 6 (six) hours as needed for wheezing or shortness of breath. 02/17/20   Blanchie Dessert, MD    Allergies    Patient has no known allergies.  Review of Systems   Review of Systems  Constitutional: Negative for fever.  HENT: Negative for nosebleeds.   Eyes: Negative for visual disturbance.  Respiratory: Negative for shortness of breath.   Cardiovascular: Negative for chest pain.  Gastrointestinal: Negative for abdominal pain, nausea and vomiting.  Genitourinary: Negative for flank pain.  Musculoskeletal: Negative for neck pain.  Skin: Negative for wound.  Neurological: Negative for weakness, numbness and headaches.  Hematological: Does not bruise/bleed easily.  Psychiatric/Behavioral: Negative for confusion.    Physical Exam Updated Vital Signs BP (!) 149/84 (BP Location: Left Arm)   Pulse (!) 50   Temp 98.5 F (36.9 C) (Oral)   Resp 18   SpO2 99%   Physical Exam Vitals and nursing note reviewed.  Constitutional:      Appearance: Normal appearance. He is well-developed.  HENT:     Head: Atraumatic.     Nose: Nose normal.     Mouth/Throat:     Mouth: Mucous membranes are moist.     Pharynx: Oropharynx is clear.  Eyes:     General: No scleral icterus.    Conjunctiva/sclera: Conjunctivae normal.     Pupils: Pupils are equal, round, and reactive to light.  Neck:     Trachea: No tracheal deviation.  Cardiovascular:     Rate and Rhythm: Normal rate and regular rhythm.     Pulses: Normal pulses.      Heart sounds: Normal heart sounds. No murmur heard. No friction rub. No gallop.   Pulmonary:     Effort: Pulmonary effort is normal. No accessory muscle usage or respiratory distress.     Breath sounds: Normal breath sounds.  Chest:     Chest wall: No tenderness.  Abdominal:     General: Bowel sounds are normal. There is no distension.     Palpations: Abdomen is soft.     Tenderness: There is no abdominal tenderness. There is no guarding.  Genitourinary:    Comments: No cva tenderness. Musculoskeletal:        General: No swelling.     Cervical back: Normal range of motion and neck supple.  No rigidity or tenderness.     Comments: Lower lumbar and right lateral buttock/hip tenderness, otherwise, CTLS spine, non tender, aligned, no step off. Left AKA. Good rom bil ext without pain or other focal bony tenderness noted.   Skin:    General: Skin is warm and dry.     Findings: No rash.  Neurological:     Mental Status: He is alert.     Comments: Alert, speech clear. GCS 15. Motor/sens grossly intact bil.   Psychiatric:        Mood and Affect: Mood normal.     ED Results / Procedures / Treatments   Labs (all labs ordered are listed, but only abnormal results are displayed) Labs Reviewed - No data to display  EKG None  Radiology DG Lumbar Spine Complete  Result Date: 03/01/2020 CLINICAL DATA:  Pain after fall. EXAM: LUMBAR SPINE - COMPLETE 4+ VIEW COMPARISON:  November 27, 2011 FINDINGS: An IVC filter is identified, placed in the interval. Calcified atherosclerosis in the abdominal aorta. No other soft tissue abnormalities are identified. No malalignment. No acute fractures are identified. No other acute abnormalities. Multilevel degenerative disc disease, moderate. Lower lumbar facet degenerative changes. IMPRESSION: 1. Degenerative changes as above. 2. No acute fracture or traumatic malalignment. 3. Calcified atherosclerosis in the abdominal aorta. Electronically Signed   By:  Dorise Bullion III M.D   On: 03/01/2020 10:10   DG HIP UNILAT W OR W/O PELVIS 2-3 VIEWS RIGHT  Result Date: 03/01/2020 CLINICAL DATA:  Pain after fall. EXAM: DG HIP (WITH OR WITHOUT PELVIS) 2-3V RIGHT COMPARISON:  None. FINDINGS: There is no evidence of hip fracture or dislocation. There is no evidence of arthropathy or other focal bone abnormality. IMPRESSION: Negative. Electronically Signed   By: Dorise Bullion III M.D   On: 03/01/2020 10:11    Procedures Procedures (including critical care time)  Medications Ordered in ED Medications - No data to display  ED Course  I have reviewed the triage vital signs and the nursing notes.  Pertinent labs & imaging results that were available during my care of the patient were reviewed by me and considered in my medical decision making (see chart for details).    MDM Rules/Calculators/A&P                          Imaging studies ordered.   Reviewed nursing notes and prior charts for additional history.   Acetaminophen po.   Xrays reviewed/interpreted by me - no fx.  Patient comfortable, alert, awake, no new c/o, and appears stable for d/c.   Rec fall precautions.   Return precautions provided.      Final Clinical Impression(s) / ED Diagnoses Final diagnoses:  None    Rx / DC Orders ED Discharge Orders    None       Lajean Saver, MD 03/01/20 1027

## 2020-03-01 NOTE — ED Notes (Signed)
PTAR here to transport pt home 

## 2020-03-01 NOTE — Discharge Instructions (Addendum)
It was our pleasure to provide your ER care today - we hope that you feel better.  Take acetaminophen as need.   Fall precautions.  Return to ER if worse, new symptoms, new or severe pain, numbness/weakness, severe headache, or other concern.

## 2020-03-01 NOTE — ED Notes (Signed)
PTAR called by Pauline Good @ 10:44

## 2020-03-01 NOTE — ED Triage Notes (Signed)
Bib ems from [facility] [place]. Unwitnessed fall in bathroom. States he hit his head. Denies blood thinners and LOC. Pt c/o right sided hip pain. Denies blood thinners

## 2020-04-09 ENCOUNTER — Encounter (HOSPITAL_COMMUNITY): Payer: Medicare HMO

## 2020-04-09 ENCOUNTER — Other Ambulatory Visit: Payer: Self-pay

## 2020-04-09 ENCOUNTER — Encounter: Payer: Medicare HMO | Admitting: Vascular Surgery

## 2020-04-09 DIAGNOSIS — L03115 Cellulitis of right lower limb: Secondary | ICD-10-CM

## 2020-04-30 ENCOUNTER — Ambulatory Visit (HOSPITAL_COMMUNITY): Payer: Medicare HMO | Attending: Vascular Surgery

## 2020-04-30 ENCOUNTER — Encounter: Payer: Medicare HMO | Admitting: Vascular Surgery

## 2020-08-23 ENCOUNTER — Emergency Department (HOSPITAL_COMMUNITY): Payer: Medicare HMO

## 2020-08-23 ENCOUNTER — Other Ambulatory Visit: Payer: Self-pay

## 2020-08-23 ENCOUNTER — Emergency Department (HOSPITAL_COMMUNITY)
Admission: EM | Admit: 2020-08-23 | Discharge: 2020-08-23 | Disposition: A | Discharge status: Home / Self Care | Payer: Medicare HMO | Attending: Emergency Medicine | Admitting: Emergency Medicine

## 2020-08-23 DIAGNOSIS — R0789 Other chest pain: Secondary | ICD-10-CM | POA: Diagnosis not present

## 2020-08-23 DIAGNOSIS — R197 Diarrhea, unspecified: Secondary | ICD-10-CM | POA: Insufficient documentation

## 2020-08-23 DIAGNOSIS — Z79899 Other long term (current) drug therapy: Secondary | ICD-10-CM | POA: Diagnosis not present

## 2020-08-23 DIAGNOSIS — I2510 Atherosclerotic heart disease of native coronary artery without angina pectoris: Secondary | ICD-10-CM | POA: Insufficient documentation

## 2020-08-23 DIAGNOSIS — I110 Hypertensive heart disease with heart failure: Secondary | ICD-10-CM | POA: Diagnosis not present

## 2020-08-23 DIAGNOSIS — I509 Heart failure, unspecified: Secondary | ICD-10-CM | POA: Diagnosis not present

## 2020-08-23 DIAGNOSIS — Z87891 Personal history of nicotine dependence: Secondary | ICD-10-CM | POA: Diagnosis not present

## 2020-08-23 DIAGNOSIS — R1030 Lower abdominal pain, unspecified: Secondary | ICD-10-CM | POA: Diagnosis not present

## 2020-08-23 LAB — CBC WITH DIFFERENTIAL/PLATELET
Abs Immature Granulocytes: 0.02 10*3/uL (ref 0.00–0.07)
Basophils Absolute: 0 10*3/uL (ref 0.0–0.1)
Basophils Relative: 1 %
Eosinophils Absolute: 0.2 10*3/uL (ref 0.0–0.5)
Eosinophils Relative: 5 %
HCT: 48.6 % (ref 39.0–52.0)
Hemoglobin: 14.7 g/dL (ref 13.0–17.0)
Immature Granulocytes: 1 %
Lymphocytes Relative: 35 %
Lymphs Abs: 1.5 10*3/uL (ref 0.7–4.0)
MCH: 26 pg (ref 26.0–34.0)
MCHC: 30.2 g/dL (ref 30.0–36.0)
MCV: 86 fL (ref 80.0–100.0)
Monocytes Absolute: 0.4 10*3/uL (ref 0.1–1.0)
Monocytes Relative: 9 %
Neutro Abs: 2.1 10*3/uL (ref 1.7–7.7)
Neutrophils Relative %: 49 %
Platelets: 126 10*3/uL — ABNORMAL LOW (ref 150–400)
RBC: 5.65 MIL/uL (ref 4.22–5.81)
RDW: 17.5 % — ABNORMAL HIGH (ref 11.5–15.5)
WBC: 4.2 10*3/uL (ref 4.0–10.5)
nRBC: 0 % (ref 0.0–0.2)

## 2020-08-23 LAB — COMPREHENSIVE METABOLIC PANEL
ALT: 16 U/L (ref 0–44)
AST: 18 U/L (ref 15–41)
Albumin: 2.9 g/dL — ABNORMAL LOW (ref 3.5–5.0)
Alkaline Phosphatase: 81 U/L (ref 38–126)
Anion gap: 6 (ref 5–15)
BUN: 31 mg/dL — ABNORMAL HIGH (ref 8–23)
CO2: 24 mmol/L (ref 22–32)
Calcium: 8.4 mg/dL — ABNORMAL LOW (ref 8.9–10.3)
Chloride: 107 mmol/L (ref 98–111)
Creatinine, Ser: 1.77 mg/dL — ABNORMAL HIGH (ref 0.61–1.24)
GFR, Estimated: 38 mL/min — ABNORMAL LOW (ref >60)
Glucose, Bld: 97 mg/dL (ref 70–99)
Potassium: 4.5 mmol/L (ref 3.5–5.1)
Sodium: 137 mmol/L (ref 135–145)
Total Bilirubin: 0.6 mg/dL (ref 0.3–1.2)
Total Protein: 6.2 g/dL — ABNORMAL LOW (ref 6.5–8.1)

## 2020-08-23 LAB — URINALYSIS, ROUTINE W REFLEX MICROSCOPIC
Bilirubin Urine: NEGATIVE
Glucose, UA: NEGATIVE mg/dL
Ketones, ur: NEGATIVE mg/dL
Nitrite: NEGATIVE
Protein, ur: 30 mg/dL — AB
Specific Gravity, Urine: 1.013 (ref 1.005–1.030)
pH: 5 (ref 5.0–8.0)

## 2020-08-23 LAB — LIPASE, BLOOD: Lipase: 40 U/L (ref 11–51)

## 2020-08-23 LAB — TROPONIN I (HIGH SENSITIVITY)
Troponin I (High Sensitivity): 30 ng/L — ABNORMAL HIGH (ref <18)
Troponin I (High Sensitivity): 31 ng/L — ABNORMAL HIGH (ref <18)

## 2020-08-23 MED ORDER — ONDANSETRON HCL 4 MG/2ML IJ SOLN
4.0000 mg | Freq: Once | INTRAMUSCULAR | Status: AC
  Administered 2020-08-23: 4 mg via INTRAVENOUS
  Filled 2020-08-23: qty 2

## 2020-08-23 MED ORDER — FENTANYL CITRATE (PF) 100 MCG/2ML IJ SOLN
50.0000 ug | Freq: Once | INTRAMUSCULAR | Status: AC
  Administered 2020-08-23: 50 ug via INTRAVENOUS
  Filled 2020-08-23: qty 2
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
## 2020-08-23 NOTE — ED Notes (Signed)
Pt given [place] sandwich bag and coke.

## 2020-08-23 NOTE — ED Triage Notes (Addendum)
Pt via [facility] for eval of two episodes of diarrhea over the last two days. Pt incontinent. Denies abdominal pain, n/v. Pt has a L AKA.

## 2020-08-23 NOTE — ED Notes (Signed)
Pt verbalizes understanding of discharge instructions. Opportunity for questions and answers were provided. Pt discharged from the ED via [facility].

## 2020-08-23 NOTE — ED Notes (Signed)
Patient transported to CT 
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
## 2020-08-23 NOTE — Discharge Instructions (Addendum)
As we discussed, your work-up today looked reassuring.  Your CT scan did show your aneurysm is 6.2 cm.  This needs to be followed up by vascular.  Please call them as soon as possible to arrange follow-up appointment.  Additionally, your CT scan showed a small cyst on your kidney.  Please follow-up with your primary care doctor regarding this.  Return to the emergency department or worsening pain, diarrhea, vomiting, fevers or any other worsening concerning symptoms.

## 2020-08-23 NOTE — ED Notes (Signed)
Pt tolerated PO fluids well. Denies N/V at this time.

## 2020-11-22 ENCOUNTER — Encounter (HOSPITAL_COMMUNITY): Payer: Self-pay | Admitting: Emergency Medicine

## 2020-11-22 ENCOUNTER — Emergency Department (HOSPITAL_COMMUNITY): Payer: Medicare HMO

## 2020-11-22 ENCOUNTER — Inpatient Hospital Stay (HOSPITAL_COMMUNITY)
Admission: EM | Admit: 2020-11-22 | Discharge: 2020-12-13 | DRG: 177 | Disposition: E | Payer: Medicare HMO | Attending: Family Medicine | Admitting: Family Medicine

## 2020-11-22 ENCOUNTER — Other Ambulatory Visit: Payer: Self-pay

## 2020-11-22 DIAGNOSIS — Z993 Dependence on wheelchair: Secondary | ICD-10-CM

## 2020-11-22 DIAGNOSIS — I252 Old myocardial infarction: Secondary | ICD-10-CM

## 2020-11-22 DIAGNOSIS — I130 Hypertensive heart and chronic kidney disease with heart failure and stage 1 through stage 4 chronic kidney disease, or unspecified chronic kidney disease: Secondary | ICD-10-CM | POA: Diagnosis present

## 2020-11-22 DIAGNOSIS — Z86718 Personal history of other venous thrombosis and embolism: Secondary | ICD-10-CM

## 2020-11-22 DIAGNOSIS — I493 Ventricular premature depolarization: Secondary | ICD-10-CM | POA: Diagnosis not present

## 2020-11-22 DIAGNOSIS — I714 Abdominal aortic aneurysm, without rupture: Secondary | ICD-10-CM | POA: Diagnosis present

## 2020-11-22 DIAGNOSIS — N400 Enlarged prostate without lower urinary tract symptoms: Secondary | ICD-10-CM | POA: Diagnosis present

## 2020-11-22 DIAGNOSIS — I472 Ventricular tachycardia: Secondary | ICD-10-CM | POA: Diagnosis present

## 2020-11-22 DIAGNOSIS — U071 COVID-19: Secondary | ICD-10-CM | POA: Diagnosis not present

## 2020-11-22 DIAGNOSIS — I4891 Unspecified atrial fibrillation: Secondary | ICD-10-CM | POA: Diagnosis present

## 2020-11-22 DIAGNOSIS — Z23 Encounter for immunization: Secondary | ICD-10-CM

## 2020-11-22 DIAGNOSIS — J90 Pleural effusion, not elsewhere classified: Secondary | ICD-10-CM

## 2020-11-22 DIAGNOSIS — I509 Heart failure, unspecified: Secondary | ICD-10-CM

## 2020-11-22 DIAGNOSIS — I10 Essential (primary) hypertension: Secondary | ICD-10-CM

## 2020-11-22 DIAGNOSIS — J9601 Acute respiratory failure with hypoxia: Secondary | ICD-10-CM

## 2020-11-22 DIAGNOSIS — N179 Acute kidney failure, unspecified: Secondary | ICD-10-CM | POA: Diagnosis present

## 2020-11-22 DIAGNOSIS — F05 Delirium due to known physiological condition: Secondary | ICD-10-CM | POA: Diagnosis not present

## 2020-11-22 DIAGNOSIS — N2889 Other specified disorders of kidney and ureter: Secondary | ICD-10-CM | POA: Diagnosis present

## 2020-11-22 DIAGNOSIS — K219 Gastro-esophageal reflux disease without esophagitis: Secondary | ICD-10-CM | POA: Diagnosis present

## 2020-11-22 DIAGNOSIS — I5082 Biventricular heart failure: Secondary | ICD-10-CM | POA: Diagnosis present

## 2020-11-22 DIAGNOSIS — K5900 Constipation, unspecified: Secondary | ICD-10-CM | POA: Diagnosis not present

## 2020-11-22 DIAGNOSIS — J440 Chronic obstructive pulmonary disease with acute lower respiratory infection: Secondary | ICD-10-CM | POA: Diagnosis present

## 2020-11-22 DIAGNOSIS — I878 Other specified disorders of veins: Secondary | ICD-10-CM | POA: Diagnosis present

## 2020-11-22 DIAGNOSIS — E669 Obesity, unspecified: Secondary | ICD-10-CM | POA: Diagnosis present

## 2020-11-22 DIAGNOSIS — J918 Pleural effusion in other conditions classified elsewhere: Secondary | ICD-10-CM | POA: Diagnosis present

## 2020-11-22 DIAGNOSIS — G8929 Other chronic pain: Secondary | ICD-10-CM | POA: Diagnosis present

## 2020-11-22 DIAGNOSIS — S50812A Abrasion of left forearm, initial encounter: Secondary | ICD-10-CM | POA: Diagnosis present

## 2020-11-22 DIAGNOSIS — Z9889 Other specified postprocedural states: Secondary | ICD-10-CM

## 2020-11-22 DIAGNOSIS — I5043 Acute on chronic combined systolic (congestive) and diastolic (congestive) heart failure: Secondary | ICD-10-CM | POA: Diagnosis present

## 2020-11-22 DIAGNOSIS — Z89612 Acquired absence of left leg above knee: Secondary | ICD-10-CM

## 2020-11-22 DIAGNOSIS — I7143 Infrarenal abdominal aortic aneurysm, without rupture: Secondary | ICD-10-CM

## 2020-11-22 DIAGNOSIS — Z86711 Personal history of pulmonary embolism: Secondary | ICD-10-CM

## 2020-11-22 DIAGNOSIS — G546 Phantom limb syndrome with pain: Secondary | ICD-10-CM | POA: Diagnosis present

## 2020-11-22 DIAGNOSIS — E785 Hyperlipidemia, unspecified: Secondary | ICD-10-CM | POA: Diagnosis present

## 2020-11-22 DIAGNOSIS — X58XXXA Exposure to other specified factors, initial encounter: Secondary | ICD-10-CM | POA: Diagnosis present

## 2020-11-22 DIAGNOSIS — Z87820 Personal history of traumatic brain injury: Secondary | ICD-10-CM

## 2020-11-22 DIAGNOSIS — J1282 Pneumonia due to coronavirus disease 2019: Secondary | ICD-10-CM | POA: Diagnosis present

## 2020-11-22 DIAGNOSIS — I513 Intracardiac thrombosis, not elsewhere classified: Secondary | ICD-10-CM | POA: Diagnosis present

## 2020-11-22 DIAGNOSIS — I340 Nonrheumatic mitral (valve) insufficiency: Secondary | ICD-10-CM | POA: Diagnosis present

## 2020-11-22 DIAGNOSIS — Z79899 Other long term (current) drug therapy: Secondary | ICD-10-CM

## 2020-11-22 DIAGNOSIS — I462 Cardiac arrest due to underlying cardiac condition: Secondary | ICD-10-CM | POA: Diagnosis not present

## 2020-11-22 DIAGNOSIS — Z6834 Body mass index (BMI) 34.0-34.9, adult: Secondary | ICD-10-CM

## 2020-11-22 DIAGNOSIS — Z95828 Presence of other vascular implants and grafts: Secondary | ICD-10-CM

## 2020-11-22 DIAGNOSIS — I2510 Atherosclerotic heart disease of native coronary artery without angina pectoris: Secondary | ICD-10-CM | POA: Diagnosis present

## 2020-11-22 DIAGNOSIS — N1832 Chronic kidney disease, stage 3b: Secondary | ICD-10-CM | POA: Diagnosis present

## 2020-11-22 DIAGNOSIS — I248 Other forms of acute ischemic heart disease: Secondary | ICD-10-CM | POA: Diagnosis present

## 2020-11-22 DIAGNOSIS — Z823 Family history of stroke: Secondary | ICD-10-CM

## 2020-11-22 DIAGNOSIS — I428 Other cardiomyopathies: Secondary | ICD-10-CM | POA: Diagnosis present

## 2020-11-22 DIAGNOSIS — R7989 Other specified abnormal findings of blood chemistry: Secondary | ICD-10-CM | POA: Diagnosis present

## 2020-11-22 DIAGNOSIS — J159 Unspecified bacterial pneumonia: Secondary | ICD-10-CM | POA: Diagnosis present

## 2020-11-22 DIAGNOSIS — E875 Hyperkalemia: Secondary | ICD-10-CM | POA: Diagnosis not present

## 2020-11-22 DIAGNOSIS — Z8249 Family history of ischemic heart disease and other diseases of the circulatory system: Secondary | ICD-10-CM

## 2020-11-22 LAB — CBC WITH DIFFERENTIAL/PLATELET
Abs Immature Granulocytes: 0.01 10*3/uL (ref 0.00–0.07)
Basophils Absolute: 0 10*3/uL (ref 0.0–0.1)
Basophils Relative: 1 %
Eosinophils Absolute: 0.1 10*3/uL (ref 0.0–0.5)
Eosinophils Relative: 1 %
HCT: 49.6 % (ref 39.0–52.0)
Hemoglobin: 14.9 g/dL (ref 13.0–17.0)
Immature Granulocytes: 0 %
Lymphocytes Relative: 26 %
Lymphs Abs: 1.1 10*3/uL (ref 0.7–4.0)
MCH: 25.9 pg — ABNORMAL LOW (ref 26.0–34.0)
MCHC: 30 g/dL (ref 30.0–36.0)
MCV: 86.3 fL (ref 80.0–100.0)
Monocytes Absolute: 0.4 10*3/uL (ref 0.1–1.0)
Monocytes Relative: 9 %
Neutro Abs: 2.8 10*3/uL (ref 1.7–7.7)
Neutrophils Relative %: 63 %
Platelets: 181 10*3/uL (ref 150–400)
RBC: 5.75 MIL/uL (ref 4.22–5.81)
RDW: 17.3 % — ABNORMAL HIGH (ref 11.5–15.5)
WBC: 4.4 10*3/uL (ref 4.0–10.5)
nRBC: 0 % (ref 0.0–0.2)

## 2020-11-22 LAB — COMPREHENSIVE METABOLIC PANEL
ALT: 11 U/L (ref 0–44)
AST: 15 U/L (ref 15–41)
Albumin: 2.4 g/dL — ABNORMAL LOW (ref 3.5–5.0)
Alkaline Phosphatase: 74 U/L (ref 38–126)
Anion gap: 11 (ref 5–15)
BUN: 24 mg/dL — ABNORMAL HIGH (ref 8–23)
CO2: 24 mmol/L (ref 22–32)
Calcium: 8.2 mg/dL — ABNORMAL LOW (ref 8.9–10.3)
Chloride: 103 mmol/L (ref 98–111)
Creatinine, Ser: 1.85 mg/dL — ABNORMAL HIGH (ref 0.61–1.24)
GFR, Estimated: 36 mL/min — ABNORMAL LOW (ref >60)
Glucose, Bld: 112 mg/dL — ABNORMAL HIGH (ref 70–99)
Potassium: 3.6 mmol/L (ref 3.5–5.1)
Sodium: 138 mmol/L (ref 135–145)
Total Bilirubin: 1.1 mg/dL (ref 0.3–1.2)
Total Protein: 6.3 g/dL — ABNORMAL LOW (ref 6.5–8.1)

## 2020-11-22 LAB — RESP PANEL BY RT-PCR (FLU A&B, COVID) ARPGX2
Influenza A by PCR: NEGATIVE
Influenza B by PCR: NEGATIVE
SARS Coronavirus 2 by RT PCR: POSITIVE — AB

## 2020-11-22 LAB — D-DIMER, QUANTITATIVE: D-Dimer, Quant: 6.66 ug/mL-FEU — ABNORMAL HIGH (ref 0.00–0.50)

## 2020-11-22 LAB — TROPONIN I (HIGH SENSITIVITY)
Troponin I (High Sensitivity): 101 ng/L (ref <18)
Troponin I (High Sensitivity): 94 ng/L — ABNORMAL HIGH (ref <18)

## 2020-11-22 LAB — BRAIN NATRIURETIC PEPTIDE: B Natriuretic Peptide: 1386 pg/mL — ABNORMAL HIGH (ref 0.0–100.0)

## 2020-11-22 MED ORDER — IPRATROPIUM-ALBUTEROL 0.5-2.5 (3) MG/3ML IN SOLN
3.0000 mL | Freq: Once | RESPIRATORY_TRACT | Status: AC
  Administered 2020-11-22: 3 mL via RESPIRATORY_TRACT
  Filled 2020-11-22: qty 3

## 2020-11-22 MED ORDER — MAGNESIUM SULFATE 2 GM/50ML IV SOLN
2.0000 g | Freq: Once | INTRAVENOUS | Status: AC
  Administered 2020-11-22: 2 g via INTRAVENOUS
  Filled 2020-11-22: qty 50

## 2020-11-22 MED ORDER — TETANUS-DIPHTH-ACELL PERTUSSIS 5-2.5-18.5 LF-MCG/0.5 IM SUSY
0.5000 mL | PREFILLED_SYRINGE | Freq: Once | INTRAMUSCULAR | Status: AC
  Administered 2020-11-22: 0.5 mL via INTRAMUSCULAR
  Filled 2020-11-22: qty 0.5

## 2020-11-22 MED ORDER — IOHEXOL 350 MG/ML SOLN
75.0000 mL | Freq: Once | INTRAVENOUS | Status: AC | PRN
  Administered 2020-11-22: 75 mL via INTRAVENOUS

## 2020-11-22 MED ORDER — FUROSEMIDE 10 MG/ML IJ SOLN
40.0000 mg | Freq: Once | INTRAMUSCULAR | Status: AC
  Administered 2020-11-22: 40 mg via INTRAVENOUS
  Filled 2020-11-22: qty 4

## 2020-11-22 NOTE — ED Notes (Signed)
Niece Milus Banister [phone] would like an update

## 2020-11-22 NOTE — ED Triage Notes (Addendum)
Pt here from home with c/o sob . Hx of copd , sats good with ems just work of breathing increases no fever and afib on the monitor , pt received 2 nebs and 125 mg solumedrol from ems

## 2020-11-22 NOTE — ED Provider Notes (Signed)
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    Rx / DC Orders ED Discharge Orders     None        Tityana Pagan A, PA-C 11/26/2020 2350    Sherwood Gambler, MD 11/24/20 [phone]

## 2020-11-22 NOTE — ED Notes (Signed)
Patient transported to CT 

## 2020-11-22 NOTE — H&P (Addendum)
[facility] Admission History and Physical Service Pager: [phone]  Patient name: Kevin [name] Medical record number: [id number] Date of birth: 1940-03-30 Age: 80 y.o. Gender: male  Primary Care Provider: Daphene Jaeger, PA-C Consultants: None  Code Status: Full Code  Preferred Emergency Contact: Kevin [name], Son, [phone]  Chief Complaint: SOB   Assessment and Plan: Kevin [name] is a 80 y.o. male presenting with SOB and hypoxia. PMH is significant for HTN, HFmrEF, AAA, chronic subdural hematoma, CKD3B, wide complex tachycardia, BPH, hx of DVT, HLD, moderate mitral regurgitation.    Hypoxic Respiratory Failure in setting of HFmrEF Exacerbation & COVID 19 Infection  Patient presenting with new oxygen requirement of 2L, patient reports improved respiratory status after nebulizer treatments and steroids while in route to ED. Patient found to be positive for COVID-19. Normal range oxygen saturations in ED ranging from 92-100% Last echocardiogram was in 2013.  Patient with elevated BNP on admission greater than 1300.  Patient initially reported shortness of breath however reports this is improved after nebulizer treatments and supplemental oxygen via nasal cannula.   Urine output thus far has been good with 900 mL. Troponin [id number].  - Admit to medical telemetry, attending Dr. Ardelia Mems - Vital signs per floor protocol - Continuous pulse oximetry, goal greater than 88% - We will wean oxygen as tolerated - Lasix 40 mg daily - Strict in intake and output - Update echocardiogram - Albuterol inhaler - consider cardiology consult for troponin elevation >100  - PT/OT consult    COVID 19 Infection COVID +, patient reports chronic cough. Afebrile on admission. New oxygen requirement, intermittent tachypnea - Start remdesivir - Start Decadron 6 mg daily x 10 days - wean oxygen as tolerated  - IS every 2 hours while awake  - Continuous pulse  oximetry    Wide-complex tachycardia EKG consistent with this finding on admission.  Patient denies chest pain.  Most recent heart rate improved from 1 teens to 107.  Home medication includes Coreg 6.25 mg twice daily.  Patient does not follow with cardiology as outpatient. -Cardiac monitoring -A.m. EKG -Consider increasing Coreg to 12.5 mg twice daily    AAAneurysm with IVC Filter Known aneurysm, 6cm in diameter similar to previous imaging. Identified in Nov of 2021 originally. Appears to be partially thrombosed on admission CT imaging today without active signs of rupture. Patient is not on chronic anticoagulation, has IVC filter confirmed in place on imaging.  -Patient will need vascular follow-up inpatient vs outpatient   Pleural Effusions, moderate  Occlusion of bronchus Noted on x-ray and chest CTA.  Larger on right side than left. Noted to be moderately sized on right and small on left. High-grade narrowing occlusion of the bronchus intermedius and right middle and right lower lobe bronchi concerning for aspiration vs endobronchial lesion or right hilar mass.  -Continuous pulse oximetry -Continue supplemental oxygen to help with reabsorption -Repeat chest x-ray in 1-2 days -Consider pulmonary consult for thoracentesis if effusions enlarge or compromise respiratory status as well as further evaluation for occlusion of bronchus   Elevated D-dimer D-dimer elevated at 6.66 on admission.  CTA of the chest was negative for pulmonary embolism but did show bronchial occlusion on the right. Also suspect increased value in setting of +COVID-19 - monitor resp status  -DVT US of RLE - consider trending d-dimer  until decreased   CKD Cr 1.85 on admission. Patient has had levels as high as 2.30 in 2019. Does not follow with nephrology  as outpatient. Noted renal mass on abd/pelv CT.  - monitor Cr on BMP/CMP while on lasix  - monitor UOP   HTN Patient hypertensive on admission with BP as high  as 175/107, most recently improved to 127/97.  Home medications include coreg 6.'25mg'$  BID. - continue home losartan  - continue hm coreg  - monitor BP with vitals signs   BPH Patient home medication includes Flomax 0.4 mg daily. -Continue home Flomax  HLD Continue home atorvastatin 40 daily   FEN/GI: heart healthy diet   Prophylaxis: Lovenox   Disposition: med-tele   History of Present Illness:  Kevin [name] is a 80 y.o. male presenting with SOB. Patient reports that he was home when he suddenly became short of breath. He reports having a chronic cough that seemed to worsen in the past few days. He denies fevers, sore throat, muscle aches but does report LLE pain that is chronic in nature. Patient is not aware of any sick contacts. Patient reports that he lives with his son. He denies any GI upset. Patient reports no chest pain with the SOB but did have some chest tightness upon initial presentation to the ED. He reports improved symptoms after nebulizer treatment in route to the ED. He has not noticed any increased swelling in his RLE. Patient reports taking "several medications" and is not sure of all the names. He states that he does not use supplemental oxygen at home. Patient reports using a wheelchair when at home.   Review Of Systems: Per HPI with the following additions:   Review of Systems  Constitutional:  Negative for activity change, chills and fever.  HENT:  Negative for rhinorrhea and sore throat.   Respiratory:  Positive for cough and shortness of breath.   Cardiovascular:  Positive for palpitations. Negative for chest pain and leg swelling.  Gastrointestinal:  Negative for abdominal pain, diarrhea, nausea and vomiting.  Genitourinary:  Negative for decreased urine volume, difficulty urinating and dysuria.  Musculoskeletal:  Negative for back pain and myalgias.  Skin:  Negative for rash and wound.  Neurological:  Negative for speech difficulty and headaches.     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   Renal failure 01/11/2012   Leucocytosis 01/11/2012   DVT, femoral, acute ([place]) 01/11/2012   DVT of axillary vein, acute ([place]) 01/04/2012   Acute pulmonary embolism ([place]) 01/04/2012   Renal mass, right 01/03/2012   Severe sepsis(995.92) 01/02/2012   Septic shock(785.52) 01/02/2012   Hypotension 01/02/2012   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[place]) 01/02/2012   Ischemic leg 12/31/2011   HTN (hypertension) 12/31/2011   CAD (coronary artery disease) 12/31/2011   CHF (congestive heart failure) ([place]) 12/31/2011   Tobacco abuse 12/31/2011   Hyperlipidemia 12/31/2011    Past Medical History: Past Medical History:  Diagnosis Date   Arthritis    BPH (benign prostatic hyperplasia)    CHF (congestive heart failure) ([place])    a. NICM/acute CHF 2007 with EF 10-20%. b. Improved EF 50-55% by echo 12/2011.   Coronary artery  disease    a. NSTEMI 2007 with cath showing mild nonobstructive CAD by cath 2007 (40% mLAD, 40%mRCA) at time of dx of NICM.   DVT (deep venous thrombosis) ([place])    Hyperlipidemia    Hypertension    Obesity    Pulmonary embolism ([place])    Tortuous aorta ([place])    a. Noted by cath 2007, recommendation for OP [place].   Valvular heart disease    a. Mod MR by echo 06/2005 but no significant MR 2013.   Wide-complex tachycardia ([place])    a. Noted 2007.    Past Surgical History: Past Surgical History:  Procedure Laterality Date    AMPUTATION  01/05/2012   Procedure: AMPUTATION ABOVE KNEE;  Surgeon: Rosetta Posner, MD;  Location: [place] [facility];  Service: Vascular;  Laterality: Left;   CRANIOTOMY Left 12/05/2013   Procedure: CRANIOTOMY HEMATOMA EVACUATION SUBDURAL- LEFT;  Surgeon: Ashok Pall, MD;  Location: [facility];  Service: Neurosurgery;  Laterality: Left;  CRANIOTOMY HEMATOMA EVACUATION SUBDURAL- LEFT   VENA CAVA FILTER PLACEMENT  01/03/2012   Procedure: INSERTION VENA-CAVA FILTER;  Surgeon: Rosetta Posner, MD;  Location: [facility];  Service: Vascular;  Laterality: Left;    Social History: Social History   Tobacco Use   Smoking status: Former   Smokeless tobacco: Current  Vaping Use   Vaping Use: Never used  Substance Use Topics   Alcohol use: No   Drug use: No   Additional social history: lives with son Please also refer to relevant sections of EMR.  Family History: Family History  Problem Relation Age of Onset   Heart attack Mother        In her 20's   Stroke Mother        In her 65's   CAD Brother     Allergies and Medications: No Known Allergies No current facility-administered medications on file prior to encounter.   Current Outpatient Medications on File Prior to Encounter  Medication Sig Dispense Refill   atorvastatin (LIPITOR) 40 MG tablet Take 1 tablet (40 mg total) by mouth daily. 30 tablet 0   carisoprodol (SOMA) 350 MG tablet Take 1 tablet (350 mg total) by mouth 3 (three) times daily. 90 tablet 0   carvedilol (COREG) 6.25 MG tablet Take 1 tablet (6.25 mg total) by mouth 2 (two) times daily with a meal. 60 tablet 0   diazepam (VALIUM) 5 MG tablet Take 5 mg by mouth 2 (two) times daily.     furosemide (LASIX) 20 MG tablet Take 20 mg by mouth daily.     oxyCODONE-acetaminophen (PERCOCET) 10-325 MG tablet Take 1 tablet by mouth 4 (four) times daily as needed for pain.     pantoprazole (PROTONIX) 40 MG tablet Take 1 tablet (40 mg total) by mouth at bedtime. (Patient not taking: No sig reported) 30  tablet 0   SPIRIVA RESPIMAT 2.5 MCG/ACT AERS Inhale 2 puffs into the lungs daily.     tamsulosin (FLOMAX) 0.4 MG CAPS capsule Take 1 capsule (0.4 mg total) by mouth daily after supper. 30 capsule 0   VENTOLIN HFA 108 (90 Base) MCG/ACT inhaler Inhale 1-2 puffs into the lungs every 6 (six) hours as needed for wheezing or shortness of breath. 1 each 0    Objective: BP (!) 134/103   Pulse 86   Temp 98.3 F (36.8 C) (Oral)   Resp (!) 31   SpO2 100%   Exam: General: elderly male appearing younger than stated age,unkempt appearing in NAD  sitting upright on hospital stretcher  Eyes: EOMI, mild scleral icterus  ENTM: MMM, no oropharyngeal erythema  Cardiovascular: tachycardia Respiratory: no wheezing at time of my exam, diminished breath sounds throughout, no crackles appreciated, mildly increased WOB on 2.5L [place]  Gastrointestinal: non-tender to palpation, abdomen is soft MSK: L AKA, RLE with 1+ pitting edema, chronic venous stasis changes  Neuro: oriented to self, not to year, [name] to report name of current president  Psych: maintains appropriate eye contact, no psychomotor slowing, no pressured speech, no objective signs of A/V hallucinations   Labs and Imaging: CBC BMET  Recent Labs  Lab 11/23/20 0431  WBC 3.9*  HGB 14.1  HCT 46.1  PLT 186   Recent Labs  Lab 11/23/20 0431  NA 135  K 4.2  CL 104  CO2 23  BUN 26*  CREATININE 1.82*  GLUCOSE 163*  CALCIUM 8.0*     EKG: intermittent ventricular tachycardia, inverted [place]    CXR: Enlargement of cardiac silhouette, bilateral pleural effusions, mild interstitial pulmonary edema  CT abdomen and pelvis: Negative for PE, moderate right sided pleural effusion, left small pleural effusion, cholelithiasis, sigmoid diverticulosis without obstruction, partially thrombosed fusiform infrarenal abdominal aortic aneurysm (6 cm diameter)  Chest CTA: narrowing an occlusion of the bronchus intermedius and right middle and right lower lobe  bronchi  Eulis Foster, MD 11/23/2020, 2:33 PM PGY-3, [place] Intern pager: [phone], text pages welcome

## 2020-11-23 ENCOUNTER — Observation Stay (HOSPITAL_COMMUNITY): Payer: Medicare HMO

## 2020-11-23 ENCOUNTER — Inpatient Hospital Stay (HOSPITAL_COMMUNITY): Payer: Medicare HMO

## 2020-11-23 ENCOUNTER — Encounter (HOSPITAL_COMMUNITY): Payer: Self-pay | Admitting: Family Medicine

## 2020-11-23 DIAGNOSIS — I2510 Atherosclerotic heart disease of native coronary artery without angina pectoris: Secondary | ICD-10-CM

## 2020-11-23 DIAGNOSIS — J918 Pleural effusion in other conditions classified elsewhere: Secondary | ICD-10-CM | POA: Diagnosis present

## 2020-11-23 DIAGNOSIS — I472 Ventricular tachycardia: Secondary | ICD-10-CM | POA: Diagnosis present

## 2020-11-23 DIAGNOSIS — J90 Pleural effusion, not elsewhere classified: Secondary | ICD-10-CM

## 2020-11-23 DIAGNOSIS — I462 Cardiac arrest due to underlying cardiac condition: Secondary | ICD-10-CM | POA: Diagnosis not present

## 2020-11-23 DIAGNOSIS — U071 COVID-19: Secondary | ICD-10-CM | POA: Diagnosis not present

## 2020-11-23 DIAGNOSIS — X58XXXA Exposure to other specified factors, initial encounter: Secondary | ICD-10-CM | POA: Diagnosis present

## 2020-11-23 DIAGNOSIS — I509 Heart failure, unspecified: Secondary | ICD-10-CM | POA: Diagnosis not present

## 2020-11-23 DIAGNOSIS — I5043 Acute on chronic combined systolic (congestive) and diastolic (congestive) heart failure: Secondary | ICD-10-CM | POA: Diagnosis not present

## 2020-11-23 DIAGNOSIS — Z23 Encounter for immunization: Secondary | ICD-10-CM | POA: Diagnosis present

## 2020-11-23 DIAGNOSIS — J1282 Pneumonia due to coronavirus disease 2019: Secondary | ICD-10-CM | POA: Diagnosis present

## 2020-11-23 DIAGNOSIS — J9601 Acute respiratory failure with hypoxia: Secondary | ICD-10-CM | POA: Diagnosis not present

## 2020-11-23 DIAGNOSIS — J9811 Atelectasis: Secondary | ICD-10-CM | POA: Diagnosis not present

## 2020-11-23 DIAGNOSIS — Z95828 Presence of other vascular implants and grafts: Secondary | ICD-10-CM | POA: Diagnosis not present

## 2020-11-23 DIAGNOSIS — Z89612 Acquired absence of left leg above knee: Secondary | ICD-10-CM | POA: Diagnosis not present

## 2020-11-23 DIAGNOSIS — Z6834 Body mass index (BMI) 34.0-34.9, adult: Secondary | ICD-10-CM | POA: Diagnosis not present

## 2020-11-23 DIAGNOSIS — I340 Nonrheumatic mitral (valve) insufficiency: Secondary | ICD-10-CM | POA: Diagnosis present

## 2020-11-23 DIAGNOSIS — J159 Unspecified bacterial pneumonia: Secondary | ICD-10-CM | POA: Diagnosis present

## 2020-11-23 DIAGNOSIS — E669 Obesity, unspecified: Secondary | ICD-10-CM | POA: Diagnosis present

## 2020-11-23 DIAGNOSIS — N189 Chronic kidney disease, unspecified: Secondary | ICD-10-CM

## 2020-11-23 DIAGNOSIS — I130 Hypertensive heart and chronic kidney disease with heart failure and stage 1 through stage 4 chronic kidney disease, or unspecified chronic kidney disease: Secondary | ICD-10-CM | POA: Diagnosis present

## 2020-11-23 DIAGNOSIS — I428 Other cardiomyopathies: Secondary | ICD-10-CM | POA: Diagnosis present

## 2020-11-23 DIAGNOSIS — I248 Other forms of acute ischemic heart disease: Secondary | ICD-10-CM | POA: Diagnosis present

## 2020-11-23 DIAGNOSIS — I420 Dilated cardiomyopathy: Secondary | ICD-10-CM | POA: Diagnosis not present

## 2020-11-23 DIAGNOSIS — I5023 Acute on chronic systolic (congestive) heart failure: Secondary | ICD-10-CM | POA: Diagnosis not present

## 2020-11-23 DIAGNOSIS — Z87891 Personal history of nicotine dependence: Secondary | ICD-10-CM

## 2020-11-23 DIAGNOSIS — N1832 Chronic kidney disease, stage 3b: Secondary | ICD-10-CM | POA: Diagnosis present

## 2020-11-23 DIAGNOSIS — G546 Phantom limb syndrome with pain: Secondary | ICD-10-CM | POA: Diagnosis present

## 2020-11-23 DIAGNOSIS — I714 Abdominal aortic aneurysm, without rupture: Secondary | ICD-10-CM | POA: Diagnosis present

## 2020-11-23 DIAGNOSIS — M7989 Other specified soft tissue disorders: Secondary | ICD-10-CM

## 2020-11-23 DIAGNOSIS — I5082 Biventricular heart failure: Secondary | ICD-10-CM | POA: Diagnosis present

## 2020-11-23 DIAGNOSIS — J440 Chronic obstructive pulmonary disease with acute lower respiratory infection: Secondary | ICD-10-CM | POA: Diagnosis present

## 2020-11-23 DIAGNOSIS — I10 Essential (primary) hypertension: Secondary | ICD-10-CM | POA: Diagnosis not present

## 2020-11-23 DIAGNOSIS — I2699 Other pulmonary embolism without acute cor pulmonale: Secondary | ICD-10-CM | POA: Diagnosis not present

## 2020-11-23 DIAGNOSIS — N179 Acute kidney failure, unspecified: Secondary | ICD-10-CM | POA: Diagnosis present

## 2020-11-23 DIAGNOSIS — I5033 Acute on chronic diastolic (congestive) heart failure: Secondary | ICD-10-CM | POA: Diagnosis not present

## 2020-11-23 DIAGNOSIS — F05 Delirium due to known physiological condition: Secondary | ICD-10-CM | POA: Diagnosis not present

## 2020-11-23 LAB — BASIC METABOLIC PANEL
Anion gap: 8 (ref 5–15)
BUN: 26 mg/dL — ABNORMAL HIGH (ref 8–23)
CO2: 23 mmol/L (ref 22–32)
Calcium: 8 mg/dL — ABNORMAL LOW (ref 8.9–10.3)
Chloride: 104 mmol/L (ref 98–111)
Creatinine, Ser: 1.82 mg/dL — ABNORMAL HIGH (ref 0.61–1.24)
GFR, Estimated: 37 mL/min — ABNORMAL LOW (ref >60)
Glucose, Bld: 163 mg/dL — ABNORMAL HIGH (ref 70–99)
Potassium: 4.2 mmol/L (ref 3.5–5.1)
Sodium: 135 mmol/L (ref 135–145)

## 2020-11-23 LAB — BODY FLUID CELL COUNT WITH DIFFERENTIAL
Eos, Fluid: 0 %
Lymphs, Fluid: 78 %
Monocyte-Macrophage-Serous Fluid: 13 % — ABNORMAL LOW (ref 50–90)
Neutrophil Count, Fluid: 9 % (ref 0–25)
Total Nucleated Cell Count, Fluid: 803 cu mm (ref 0–1000)

## 2020-11-23 LAB — TROPONIN I (HIGH SENSITIVITY)
Troponin I (High Sensitivity): 91 ng/L — ABNORMAL HIGH (ref <18)
Troponin I (High Sensitivity): 132 ng/L (ref <18)
Troponin I (High Sensitivity): 112 ng/L (ref <18)
Troponin I (High Sensitivity): 118 ng/L (ref <18)

## 2020-11-23 LAB — CBC
HCT: 46.1 % (ref 39.0–52.0)
Hemoglobin: 14.1 g/dL (ref 13.0–17.0)
MCH: 25.8 pg — ABNORMAL LOW (ref 26.0–34.0)
MCHC: 30.6 g/dL (ref 30.0–36.0)
MCV: 84.4 fL (ref 80.0–100.0)
Platelets: 186 10*3/uL (ref 150–400)
RBC: 5.46 MIL/uL (ref 4.22–5.81)
RDW: 17.2 % — ABNORMAL HIGH (ref 11.5–15.5)
WBC: 3.9 10*3/uL — ABNORMAL LOW (ref 4.0–10.5)
nRBC: 0 % (ref 0.0–0.2)

## 2020-11-23 LAB — GLUCOSE, PLEURAL OR PERITONEAL FLUID: Glucose, Fluid: 178 mg/dL

## 2020-11-23 LAB — STREP PNEUMONIAE URINARY ANTIGEN: Strep Pneumo Urinary Antigen: NEGATIVE

## 2020-11-23 LAB — LACTATE DEHYDROGENASE: LDH: 210 U/L — ABNORMAL HIGH (ref 98–192)

## 2020-11-23 LAB — PROCALCITONIN: Procalcitonin: 0.1 ng/mL

## 2020-11-23 LAB — PROTEIN, TOTAL: Total Protein: 5.9 g/dL — ABNORMAL LOW (ref 6.5–8.1)

## 2020-11-23 LAB — PROTEIN, PLEURAL OR PERITONEAL FLUID: Total protein, fluid: <3 g/dL

## 2020-11-23 LAB — LACTATE DEHYDROGENASE, PLEURAL OR PERITONEAL FLUID: LD, Fluid: 66 U/L — ABNORMAL HIGH (ref 3–23)

## 2020-11-23 MED ORDER — TAMSULOSIN HCL 0.4 MG PO CAPS
0.4000 mg | ORAL_CAPSULE | Freq: Every day | ORAL | Status: DC
  Administered 2020-11-23 – 2020-12-02 (×10): 0.4 mg via ORAL
  Filled 2020-11-23 (×10): qty 1

## 2020-11-23 MED ORDER — SODIUM CHLORIDE 0.9 % IV SOLN
2.0000 g | INTRAVENOUS | Status: AC
  Administered 2020-11-23 – 2020-11-27 (×5): 2 g via INTRAVENOUS
  Filled 2020-11-23 (×5): qty 20

## 2020-11-23 MED ORDER — UMECLIDINIUM BROMIDE 62.5 MCG/INH IN AEPB
1.0000 | INHALATION_SPRAY | Freq: Every day | RESPIRATORY_TRACT | Status: DC
  Administered 2020-11-24 – 2020-12-03 (×8): 1 via RESPIRATORY_TRACT
  Filled 2020-11-23 (×2): qty 7

## 2020-11-23 MED ORDER — ENOXAPARIN SODIUM 30 MG/0.3ML IJ SOSY
30.0000 mg | PREFILLED_SYRINGE | Freq: Every day | INTRAMUSCULAR | Status: DC
  Administered 2020-11-23 – 2020-12-03 (×11): 30 mg via SUBCUTANEOUS
  Filled 2020-11-23 (×11): qty 0.3

## 2020-11-23 MED ORDER — ALBUTEROL SULFATE HFA 108 (90 BASE) MCG/ACT IN AERS
2.0000 | INHALATION_SPRAY | Freq: Two times a day (BID) | RESPIRATORY_TRACT | Status: DC
  Administered 2020-11-24 – 2020-12-03 (×18): 2 via RESPIRATORY_TRACT
  Filled 2020-11-23: qty 6.7

## 2020-11-23 MED ORDER — CARVEDILOL 6.25 MG PO TABS
6.2500 mg | ORAL_TABLET | Freq: Two times a day (BID) | ORAL | Status: DC
  Administered 2020-11-23 – 2020-12-03 (×22): 6.25 mg via ORAL
  Filled 2020-11-23 (×12): qty 1
  Filled 2020-11-23: qty 2
  Filled 2020-11-23 (×7): qty 1
  Filled 2020-11-23: qty 2
  Filled 2020-11-23: qty 1

## 2020-11-23 MED ORDER — SODIUM CHLORIDE 0.9% FLUSH: 3.0000 mL | INTRAVENOUS | Status: DC | PRN

## 2020-11-23 MED ORDER — ATORVASTATIN CALCIUM 40 MG PO TABS
40.0000 mg | ORAL_TABLET | Freq: Every day | ORAL | Status: DC
  Administered 2020-11-23 – 2020-12-03 (×11): 40 mg via ORAL
  Filled 2020-11-23 (×11): qty 1

## 2020-11-23 MED ORDER — ALBUTEROL SULFATE HFA 108 (90 BASE) MCG/ACT IN AERS
1.0000 | INHALATION_SPRAY | RESPIRATORY_TRACT | Status: DC
  Administered 2020-11-23 (×3): 2 via RESPIRATORY_TRACT
  Filled 2020-11-23 (×2): qty 6.7

## 2020-11-23 MED ORDER — ONDANSETRON HCL 4 MG/2ML IJ SOLN: 4.0000 mg | Freq: Four times a day (QID) | INTRAMUSCULAR | Status: DC | PRN

## 2020-11-23 MED ORDER — SODIUM CHLORIDE 0.9% FLUSH: 3.0000 mL | Freq: Two times a day (BID) | INTRAVENOUS | Status: DC

## 2020-11-23 MED ORDER — SODIUM CHLORIDE 0.9 % IV SOLN: 250.0000 mL | INTRAVENOUS | Status: DC | PRN

## 2020-11-23 MED ORDER — SODIUM CHLORIDE 0.9 % IV SOLN
200.0000 mg | Freq: Once | INTRAVENOUS | Status: AC
  Administered 2020-11-23: 200 mg via INTRAVENOUS
  Filled 2020-11-23: qty 40

## 2020-11-23 MED ORDER — ACETAMINOPHEN 325 MG PO TABS
650.0000 mg | ORAL_TABLET | ORAL | Status: DC | PRN
  Administered 2020-11-23 – 2020-11-30 (×3): 650 mg via ORAL
  Filled 2020-11-23 (×4): qty 2

## 2020-11-23 MED ORDER — FUROSEMIDE 10 MG/ML IJ SOLN
40.0000 mg | Freq: Every day | INTRAMUSCULAR | Status: AC
  Administered 2020-11-23 – 2020-11-24 (×2): 40 mg via INTRAVENOUS
  Filled 2020-11-23 (×2): qty 4

## 2020-11-23 MED ORDER — IPRATROPIUM-ALBUTEROL 0.5-2.5 (3) MG/3ML IN SOLN: 3.0000 mL | RESPIRATORY_TRACT | Status: DC | PRN

## 2020-11-23 MED ORDER — ALBUTEROL SULFATE HFA 108 (90 BASE) MCG/ACT IN AERS: 1.0000 | INHALATION_SPRAY | Freq: Four times a day (QID) | RESPIRATORY_TRACT | Status: DC | PRN

## 2020-11-23 MED ORDER — DEXAMETHASONE 6 MG PO TABS
6.0000 mg | ORAL_TABLET | Freq: Every day | ORAL | Status: AC
  Administered 2020-11-23 – 2020-12-01 (×10): 6 mg via ORAL
  Filled 2020-11-23 (×3): qty 1
  Filled 2020-11-23: qty 2
  Filled 2020-11-23 (×3): qty 1
  Filled 2020-11-23: qty 2
  Filled 2020-11-23 (×2): qty 1

## 2020-11-23 MED ORDER — SODIUM CHLORIDE 0.9 % IV SOLN
100.0000 mg | Freq: Every day | INTRAVENOUS | Status: AC
  Administered 2020-11-24 – 2020-11-27 (×4): 100 mg via INTRAVENOUS
  Filled 2020-11-23: qty 100
  Filled 2020-11-23: qty 20
  Filled 2020-11-23: qty 100
  Filled 2020-11-23: qty 20

## 2020-11-23 MED ORDER — SODIUM CHLORIDE 0.9 % IV SOLN
500.0000 mg | INTRAVENOUS | Status: DC
  Administered 2020-11-23 – 2020-11-24 (×2): 500 mg via INTRAVENOUS
  Filled 2020-11-23 (×2): qty 500

## 2020-11-23 NOTE — Consult Note (Signed)
[facility] [place]  ASSESSMENT / PLAN: 80 y.o. male with fusiform infrarenal abdominal aortic aneurysm measuring 6.1 cm in greatest dimension.  The associated mural thrombus does not require therapeutic anticoagulation.  The aneurysm is at the size criteria for repair.  The patient needs to recover from his acute illness before considering elective repair.  We will arrange for follow-up with me in 2 to 3 weeks to discuss EVAR.  We will check a CT angiogram prior to my appointment to define the anatomy and plan the repair.  Please call for questions.  CHIEF COMPLAINT: Incidentally noted infrarenal abdominal aortic aneurysm  HISTORY OF PRESENT ILLNESS: Kevin [name] is a 80 y.o. male admitted to the medicine service for respiratory failure in the setting of congestive heart failure and COVID-19 infection.  Patient initially presented to the ER 11/27/2020 reporting chest tightness and shortness of breath.  He was found to be COVID-positive.  CT scan done to evaluate for possible pulmonary embolism demonstrated a infrarenal abdominal aortic aneurysm.  Vascular surgery input was requested.   On my evaluation, the patient is feeling better.  He reports minimal dyspnea.  He has no abdominal pain.  He has no back pain.  He is aware that he had an abdominal aortic aneurysm, but has not sought follow-up for it yet.  VASCULAR SURGICAL HISTORY: He had an IVC filter placed in 2013.  He had a left above-knee amputation in 2013.   VASCULAR RISK FACTORS: Negative history of stroke / transient ischemic attack. Positive history of coronary artery disease.  Negative history of diabetes mellitus. No recent A1c. Positive history of smoking. Not actively smoking. Positive history of hypertension.  Positive history of chronic kidney disease.  Last GFR 37. CKD 3B. Positive history of chronic obstructive pulmonary disease.Marland Kitchen  Past Medical History:  Diagnosis Date   Arthritis    BPH (benign  prostatic hyperplasia)    CHF (congestive heart failure) ([place])    a. NICM/acute CHF 2007 with EF 10-20%. b. Improved EF 50-55% by echo 12/2011.   Coronary artery disease    a. NSTEMI 2007 with cath showing mild nonobstructive CAD by cath 2007 (40% mLAD, 40%mRCA) at time of dx of NICM.   DVT (deep venous thrombosis) ([place])    Hyperlipidemia    Hypertension    Obesity    Pulmonary embolism ([place])    Tortuous aorta ([place])    a. Noted by cath 2007, recommendation for OP [place].   Valvular heart disease    a. Mod MR by echo 06/2005 but no significant MR 2013.   Wide-complex tachycardia ([place])    a. Noted 2007.    Past Surgical History:  Procedure Laterality Date   AMPUTATION  01/05/2012   Procedure: AMPUTATION ABOVE KNEE;  Surgeon: Rosetta Posner, MD;  Location: [facility];  Service: Vascular;  Laterality: Left;   CRANIOTOMY Left 12/05/2013   Procedure: CRANIOTOMY HEMATOMA EVACUATION SUBDURAL- LEFT;  Surgeon: Ashok Pall, MD;  Location: [facility];  Service: Neurosurgery;  Laterality: Left;  CRANIOTOMY HEMATOMA EVACUATION SUBDURAL- LEFT   VENA CAVA FILTER PLACEMENT  01/03/2012   Procedure: INSERTION VENA-CAVA FILTER;  Surgeon: Rosetta Posner, MD;  [facility];  Service: Vascular;  Laterality: Left;    Family History  Problem Relation Age of Onset   Heart attack Mother        In her 14's   Stroke Mother        In her 25's   CAD Brother  Social History   Socioeconomic History   Marital status: Legally Separated    Spouse name: Not on file   Number of children: Not on file   Years of education: Not on file   Highest education level: Not on file  Occupational History   Not on file  Tobacco Use   Smoking status: Former   Smokeless tobacco: Current  Vaping Use   Vaping Use: Never used  Substance and Sexual Activity   Alcohol use: No   Drug use: No   Sexual activity: Not on file  Other Topics Concern   Not on file  Social History Narrative   Not on file   Social Determinants  of Health   Financial Resource Strain: Not on file  Food Insecurity: Not on file  Transportation Needs: Not on file  Physical Activity: Not on file  Stress: Not on file  Social Connections: Not on file  Intimate Partner Violence: Not on file    No Known Allergies  Current Facility-Administered Medications  Medication Dose Route Frequency Provider Last Rate Last Admin   acetaminophen (TYLENOL) tablet 650 mg  650 mg Oral Q4H PRN Simmons-Robinson, Makiera, MD       albuterol (VENTOLIN HFA) 108 (90 Base) MCG/ACT inhaler 1-2 puff  1-2 puff Inhalation Q4H Simmons-Robinson, Makiera, MD   2 puff at 11/23/20 0924   atorvastatin (LIPITOR) tablet 40 mg  40 mg Oral Daily Simmons-Robinson, Makiera, MD   40 mg at 11/23/20 0923   azithromycin (ZITHROMAX) 500 mg in sodium chloride 0.9 % 250 mL IVPB  500 mg Intravenous Q24H Jennelle Human B, NP       carvedilol (COREG) tablet 6.25 mg  6.25 mg Oral BID WC Simmons-Robinson, Makiera, MD   6.25 mg at 11/23/20 0810   cefTRIAXone (ROCEPHIN) 2 g in sodium chloride 0.9 % 100 mL IVPB  2 g Intravenous Q24H Jennelle Human B, NP       dexamethasone (DECADRON) tablet 6 mg  6 mg Oral Daily Simmons-Robinson, Makiera, MD   6 mg at 11/23/20 0923   enoxaparin (LOVENOX) injection 30 mg  30 mg Subcutaneous Daily Simmons-Robinson, Makiera, MD   30 mg at 11/23/20 0926   furosemide (LASIX) injection 40 mg  40 mg Intravenous Daily Simmons-Robinson, Makiera, MD   40 mg at 11/23/20 0925   ondansetron (ZOFRAN) injection 4 mg  4 mg Intravenous Q6H PRN Simmons-Robinson, Makiera, MD       [START ON 11/24/2020] remdesivir 100 mg in sodium chloride 0.9 % 100 mL IVPB  100 mg Intravenous Daily Simmons-Robinson, Makiera, MD       tamsulosin (FLOMAX) capsule 0.4 mg  0.4 mg Oral QPC supper Simmons-Robinson, Makiera, MD       umeclidinium bromide (INCRUSE ELLIPTA) 62.5 MCG/INH 1 puff  1 puff Inhalation Daily Simmons-Robinson, Makiera, MD       Current Outpatient Medications  Medication Sig  Dispense Refill   atorvastatin (LIPITOR) 40 MG tablet Take 1 tablet (40 mg total) by mouth daily. 30 tablet 0   carisoprodol (SOMA) 350 MG tablet Take 1 tablet (350 mg total) by mouth 3 (three) times daily. 90 tablet 0   carvedilol (COREG) 6.25 MG tablet Take 1 tablet (6.25 mg total) by mouth 2 (two) times daily with a meal. 60 tablet 0   diazepam (VALIUM) 5 MG tablet Take 5 mg by mouth 2 (two) times daily.     furosemide (LASIX) 20 MG tablet Take 20 mg by mouth daily.     oxyCODONE-acetaminophen (  PERCOCET) 10-325 MG tablet Take 1 tablet by mouth 4 (four) times daily as needed for pain.     pantoprazole (PROTONIX) 40 MG tablet Take 1 tablet (40 mg total) by mouth at bedtime. (Patient not taking: No sig reported) 30 tablet 0   SPIRIVA RESPIMAT 2.5 MCG/ACT AERS Inhale 2 puffs into the lungs daily.     tamsulosin (FLOMAX) 0.4 MG CAPS capsule Take 1 capsule (0.4 mg total) by mouth daily after supper. 30 capsule 0   VENTOLIN HFA 108 (90 Base) MCG/ACT inhaler Inhale 1-2 puffs into the lungs every 6 (six) hours as needed for wheezing or shortness of breath. 1 each 0    REVIEW OF SYSTEMS:  '[X]'$  denotes positive finding, '[ ]'$  denotes negative finding Cardiac  Comments:  Chest pain or chest pressure:    Shortness of breath upon exertion: x   Short of breath when lying flat: x   Irregular heart rhythm:        Vascular    Pain in calf, thigh, or hip brought on by ambulation:    Pain in feet at night that wakes you up from your sleep:     Blood clot in your veins:    Leg swelling:         Pulmonary    Oxygen at home:    Productive cough:     Wheezing:         Neurologic    Sudden weakness in arms or legs:     Sudden numbness in arms or legs:     Sudden onset of difficulty speaking or slurred speech:    Temporary loss of vision in one eye:     Problems with dizziness:         Gastrointestinal    Blood in stool:     Vomited blood:         Genitourinary    Burning when urinating:     Blood  in urine:        Psychiatric    Major depression:         Hematologic    Bleeding problems:    Problems with blood clotting too easily:        Skin    Rashes or ulcers:        Constitutional    Fever or chills:      PHYSICAL EXAM Vitals:   11/23/20 0715 11/23/20 0730 11/23/20 0815 11/23/20 0900  BP: (!) 140/94 (!) 121/96 (!) 119/96 (!) 127/96  Pulse: 69 (!) 53 78 81  Resp: (!) 23 (!) 26 (!) 24 (!) 28  Temp:      TempSrc:      SpO2: 99% 98% 99% 98%    Constitutional: Chronically ill appearing. Elderly. No distress. Appears well nourished.  Neurologic: CN intact. no focal findings. no sensory loss. Psychiatric:  Mood and affect symmetric and appropriate. Eyes:  No icterus. No conjunctival pallor. Ears, nose, throat:  mucous membranes moist. Midline trachea.  Cardiac: regular rate and rhythm.  Respiratory:  unlabored. Abdominal: obese, soft, non-tender, non-distended. No palpable abdominal aortic aneurysm. Peripheral vascular: L AKA. R 1+ DP. Chronic venous insufficiency RLE. Extremity: No edema. No cyanosis. No pallor.  Skin: No gangrene. No ulceration.  Lymphatic: No Stemmer's sign. No palpable lymphadenopathy.  PERTINENT LABORATORY AND RADIOLOGIC DATA  Most recent CBC CBC Latest Ref Rng & Units 11/23/2020 12/01/2020 08/23/2020  WBC 4.0 - 10.5 K/uL 3.9(L) 4.4 4.2  Hemoglobin 13.0 - 17.0 g/dL 14.1 14.9 14.7  Hematocrit 39.0 - 52.0 % 46.1 49.6 48.6  Platelets 150 - 400 K/uL 186 181 126(L)     Most recent CMP CMP Latest Ref Rng & Units 11/23/2020 11/26/2020 08/23/2020  Glucose 70 - 99 mg/dL 163(H) 112(H) 97  BUN 8 - 23 mg/dL 26(H) 24(H) 31(H)  Creatinine 0.61 - 1.24 mg/dL 1.82(H) 1.85(H) 1.77(H)  Sodium 135 - 145 mmol/L 135 138 137  Potassium 3.5 - 5.1 mmol/L 4.2 3.6 4.5  Chloride 98 - 111 mmol/L 104 103 107  CO2 22 - 32 mmol/L '23 24 24  '$ Calcium 8.9 - 10.3 mg/dL 8.0(L) 8.2(L) 8.4(L)  Total Protein 6.5 - 8.1 g/dL - 6.3(L) 6.2(L)  Total Bilirubin 0.3 - 1.2 mg/dL - 1.1  0.6  Alkaline Phos 38 - 126 U/L - 74 81  AST 15 - 41 U/L - 15 18  ALT 0 - 44 U/L - 11 16    Renal function CrCl cannot be calculated (Unknown ideal weight.).  No results found for: HGBA1C  LDL Cholesterol  Date Value Ref Range Status  01/10/2012 93 0 - 99 mg/dL Final    Comment:           Total Cholesterol/HDL:CHD Risk Coronary Heart Disease Risk Table                     Men   Women  1/2 Average Risk   3.4   3.3  Average Risk       5.0   4.4  2 X Average Risk   9.6   7.1  3 X Average Risk  23.4   11.0        Use the calculated Patient Ratio above and the CHD Risk Table to determine the patient's CHD Risk.        ATP III CLASSIFICATION (LDL):  <100     mg/dL   Optimal  100-129  mg/dL   Near or Above                    Optimal  130-159  mg/dL   Borderline  160-189  mg/dL   High  >190     mg/dL   Very High    CT abdomen and pelvis personally reviewed.  6 cm fusiform abdominal aortic aneurysm with intramural thrombus.  Appears amenable to endovascular repair.  Yevonne Aline. Stanford Breed, MD [facility] Phone Number: [phone] 11/23/2020 10:14 AM  Total time spent on preparing this encounter including chart review, data review, collecting history, examining the patient, coordinating care for this new patient, 60 minutes.  Portions of this report may have been transcribed using voice recognition software.  Every effort has been made to ensure accuracy; however, inadvertent computerized transcription errors may still be present.

## 2020-11-23 NOTE — Progress Notes (Signed)
VASCULAR LAB    Right lower extremity venous duplex has been performed.  See CV proc for preliminary results.   Emilio Baylock, RVT 11/23/2020, 9:28 AM

## 2020-11-23 NOTE — Progress Notes (Signed)
FPTS Interim Progress Note  Checked on patient this morning, breathing comfortably on room air now. Denies any concerns. Pulmonologist in the room as well, states that they will continue diuresis  and possibly drain the pleural effusion but would have to be on the floor given patient's COVID status. Appreciate recommendations from pulmonology.   Reached out to vascular team given recent imaging on admission notable for partially thrombosed infrarenal AAA in the setting of patient's history of prior AAA. Spoke with Dr. Stanford Breed who shares that the thrombus is less concerning given that it is the body's natural response to an aneurysm and most likely represents chronic changes. Discussed that patient is not currently on anticoagulation given multiple factors including prior subdural hematoma. Will continue to hold off on anticoagulation at this time given he has been off. Dr. Stanford Breed says he will be happy to see him today but would like to follow up outpatient and leave the IVC filter in place. Greatly appreciate recommendations and expertise of Dr. Stanford Breed.   Donney Dice, DO 11/23/2020, 9:53 AM PGY-2, [facility] pager [phone]

## 2020-11-23 NOTE — ED Notes (Signed)
Echo at bedside

## 2020-11-23 NOTE — ED Notes (Signed)
Breakfast Ordered 

## 2020-11-23 NOTE — Progress Notes (Addendum)
PT Cancellation Note  Patient Details Name: Kevin [name] MRN: [id number] DOB: 1940/07/01   Cancelled Treatment:    Reason Eval/Treat Not Completed: Medical issues which prohibited therapy. Order for RLE [place] to assess for possible DVT. Pt also with elevated troponin, elevated d-dimer, and BP 133/109. Will await [place] results and cardiology clearance prior to mobilization.   Lorriane Shire 11/23/2020, 7:57 AM  Lorrin Goodell, PT  Office # [phone] Pager [phone]

## 2020-11-23 NOTE — Progress Notes (Addendum)
OT Cancellation Note  Patient Details Name: Kevin [name] MRN: [id number] DOB: 09-30-1940   Cancelled Treatment:    Reason Eval/Treat Not Completed: Medical issues which prohibited therapy.  Pt also with elevated troponin, elevated d-dimer, and BP 133/109. Will await [place] results and cardiology clearance prior to mobilization. Golden Circle, OTR/L Acute Rehab Services Pager [phone] Office [phone]    Almon Register 11/23/2020, 8:06 AM

## 2020-11-23 NOTE — Procedures (Signed)
Thoracentesis  Procedure Note  MAKSYM LAUBENSTEIN  [id number]  [birth year]  Date:11/23/20  Time:2:52 PM   Provider Performing:Sharlette Jansma Rodman Pickle   Procedure: Thoracentesis with imaging guidance [phone])  Indication(s) Pleural Effusion  Consent Risks of the procedure as well as the alternatives and risks of each were explained to the patient and/or caregiver.  Consent for the procedure was obtained and is signed in the bedside chart  Anesthesia Topical only with 1% lidocaine    Time Out Verified patient identification, verified procedure, site/side was marked, verified correct patient position, special equipment/implants available, medications/allergies/relevant history reviewed, required imaging and test results available.   Sterile Technique Maximal sterile technique including full sterile barrier drape, hand hygiene, sterile gown, sterile gloves, mask, hair covering, sterile ultrasound probe cover (if used).  Procedure Description Ultrasound was used to identify appropriate pleural anatomy for placement and overlying skin marked.  Area of drainage cleaned and draped in sterile fashion. Lidocaine was used to anesthetize the skin and subcutaneous tissue.  1400 cc's of serosanguinous fluid appearing fluid was drained from the right pleural space. Catheter then removed and bandaid applied to site.    Complications/Tolerance None; patient tolerated the procedure well. Chest X-ray is ordered to confirm no post-procedural complication.   EBL Minimal   Specimen(s) Pleural fluid

## 2020-11-23 NOTE — Consult Note (Signed)
NAME:  Kevin [name], MRN:  [id number], DOB:  [birth year], LOS: 0 ADMISSION DATE:  11/18/2020, CONSULTATION DATE:  11/23/2020 REFERRING MD:  Dr. Quentin Cornwall, CHIEF COMPLAINT:  SOB/ pleural effusion   History of Present Illness:  80 year old male with prior history of former tobacco abuse, HTN, HRrEF, AAA, CKD3B, wide complex tachycardia, hx DVT/ PE- not currently on anticoagulants but has IVC filter, hx of chronic SDH, HLD, mod MR, BPH, and Left AKA who presented to [facility] ER on 9/10 with complaints of shortness of breath.    Patient tells me that his shortness of breath started Wednesday and became progressively worse yesterday.  He reports a chronic cough of grey to white sputum and doesn't think the color or amount has changed.  Denies fever, chills, difficulty swallowing or choking episodes.  He reports he started smoking at the age of 70 or 89 and at his most, smoked 2 ppds of cigarettes but quit 5 years ago.  He says he has been told he has COPD but doesn't see anyone and gets his inhalers from his son. He uses spiriva and albuterol HFA at home per med rec, however I do not see any PFTs for formal diagnosis.  Reports using his inhalers more frequently this week.  Denies any chronic oxygen use or other respiratory diagnosis.  He is a poor historian.  He lives with his son and is wheelchair bound.  He does not know if he has been taking his lasix at home.  Denies any other complaint of N/V/abd pain, diarrhea, syncopal episodes, or weakness.  No known sick contacts.  Found to be afebrile, normal saturations with EMS but in Afib and increased work of breathing with expiratory wheeze.  Was given nebs and solumedrol.  In workup, patient found to COVID positive, with elevated ddimer 6.66, and flat troponin hs trend 101> 94> 132, and BNP 1386.  CXR showed bilateral pleural effusions and given concern for PE, CTA PE was performed.  Study limited, but no definite PE, showed moderate right and small left pleural  effusion with compressive atelectasis of the RML and LLL, pneumonia not excluded.  Additionally showed high grade narrowing of the bronchus intermedius and RML and RLL bronchi, aspiration an or endobronchial lesion, right hilar mass not excluded.  Patient was admitted to [facility] practice teaching service and Pulmonary consulted for further recommendations for CT chest findings.   9/10 SARS  >positive  Flu > neg  Pertinent  Medical History  Former smoker, ? COPD, HTN, HRrEF, AAA, CKD3B, wide complex tachycardia, hx DVT/ PE with IVC filter, chronic subdural hematoma, HLD, mod MR, BPH  Significant Hospital Events: Including procedures, antibiotic start and stop dates in addition to other pertinent events   9/11 Admitted w/FPTS  Interim History / Subjective:  Currently denies any SOB.   Objective   Blood pressure (!) 133/109, pulse 73, temperature 97.9 F (36.6 C), temperature source Oral, resp. rate (!) 27, SpO2 99 %.        Intake/Output Summary (Last 24 hours) at 11/23/2020 0759 Last data filed at 11/23/2020 0215 Gross per 24 hour  Intake 50 ml  Output 900 ml  Net -850 ml   There were no vitals filed for this visit.  Examination: General:  chronically unwell elderly male sitting upright in bed eating breakfast in ER.  HEENT: MM pink/moist, perrl, +JVD Neuro: Alert, oriented to person/ place/ year, MAE  CV: rr ir PULM:  non labored but some visual dyspnea with talking  long sentences, left clear, right clear anteriorly but diminished in lower lobes.  No wheeze.  O2 sat 98% on room air on my exam GI: obese, soft, bs active, NT, primafit with good CYU thus far Extremities: warm/dry, +1 pitting edema in RLE with chronic venous stasis changes, L AKA  Resolved Hospital Problem list    Assessment & Plan:   Pleural effusions -  R>L with compressive atelectasis and high grade narrowing of the bronchus intermedius and RML and RLL bronchi, can not exclude pneumonia, aspiration/ debris in  airway, or possible obscuring mass (given his smoking history) at this time.  Additionally HF exacerbation could be contributing to pleural effusions.  COVID positive  Hx COPD, without formal PFTs Dyspnea Former Tobacco abuse  Heart failure exacerbation - CTA without evidence of acute PE Plan - Oxygen saturations have ranged from 92-99%, supplemental O2 prn for goal sat <90% - continue airborne and contact precautions.  Decadron, remdesivir per primary team - ongoing aggressive pulm hygiene - at this time, would agree with diuresis, steroids as above, and nebs.   Will start empiric abx and if PCT neg, and remains afebrile and no leukocytosis, can stop abx.  Check urine legionella and strep as well.  - Would likely benefit from a diagnostic/ therapeutic thoracentesis on the right if not improved with diuresis for better visualization of right lung.  At this time, do not feel that this warrants a bronchoscopy.   - continue with albuterol HFA and incruse in place of home Spiriva  - consider SLP if any difficulty swallowing.  - pending TTE   Remainder per primary.   PCCM will continue to follow.  Best Practice (right click and "Reselect all SmartList Selections" daily)   Diet/type: Regular consistency (see orders) DVT prophylaxis: LMWH GI prophylaxis: N/A Lines: N/A Foley:  N/A Code Status:  full code Last date of multidisciplinary goals of care discussion [per primary ]  Labs   CBC: Recent Labs  Lab 11/18/2020 1644 11/23/20 0431  WBC 4.4 3.9*  NEUTROABS 2.8  --   HGB 14.9 14.1  HCT 49.6 46.1  MCV 86.3 84.4  PLT 181 [id number]    Basic Metabolic Panel: Recent Labs  Lab 12/02/2020 1644 11/23/20 0431  NA 138 135  K 3.6 4.2  CL 103 104  CO2 24 23  GLUCOSE 112* 163*  BUN 24* 26*  CREATININE 1.85* 1.82*  CALCIUM 8.2* 8.0*   GFR: CrCl cannot be calculated (Unknown ideal weight.). Recent Labs  Lab 11/21/2020 1644 11/23/20 0431  WBC 4.4 3.9*    Liver Function Tests: Recent  Labs  Lab 11/21/2020 1644  AST 15  ALT 11  ALKPHOS 74  BILITOT 1.1  PROT 6.3*  ALBUMIN 2.4*   No results for input(s): LIPASE, AMYLASE in the last 168 hours. No results for input(s): AMMONIA in the last 168 hours.  ABG    Component Value Date/Time   TCO2 23 02/12/2020 1805   O2SAT 36.2 01/02/2012 1430     Coagulation Profile: No results for input(s): INR, PROTIME in the last 168 hours.  Cardiac Enzymes: No results for input(s): CKTOTAL, CKMB, CKMBINDEX, TROPONINI in the last 168 hours.  HbA1C: No results found for: HGBA1C  CBG: No results for input(s): GLUCAP in the last 168 hours.  Review of Systems:   Review of Systems  Constitutional:  Negative for chills, fever and malaise/fatigue.  Respiratory:  Positive for cough, shortness of breath and wheezing. Negative for hemoptysis.   Cardiovascular:  Positive  for orthopnea. Negative for chest pain.  Gastrointestinal:  Negative for abdominal pain, diarrhea, nausea and vomiting.  Neurological:  Negative for focal weakness and loss of consciousness.   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   Surgical History:   Past Surgical History:  Procedure Laterality Date   AMPUTATION  01/05/2012   Procedure: AMPUTATION ABOVE KNEE;  Surgeon: Rosetta Posner, MD;  Location: [place];  Service: Vascular;  Laterality: Left;   CRANIOTOMY Left 12/05/2013   Procedure: CRANIOTOMY HEMATOMA EVACUATION SUBDURAL- LEFT;  Surgeon: Ashok Pall, MD;  Location: [place] [facility];  Service: Neurosurgery;  Laterality: Left;  CRANIOTOMY HEMATOMA EVACUATION SUBDURAL- LEFT   VENA CAVA FILTER PLACEMENT  01/03/2012   Procedure: INSERTION VENA-CAVA FILTER;   Surgeon: Rosetta Posner, MD;  Location: [place];  Service: Vascular;  Laterality: Left;     Social History:   reports that he has quit smoking. He uses smokeless tobacco. He reports that he does not drink alcohol and does not use drugs.   Family History:  His family history includes CAD in his brother; Heart attack in his mother; Stroke in his mother.   Allergies No Known Allergies   Home Medications  Prior to Admission medications   Medication Sig Start Date End Date Taking? Authorizing Provider  atorvastatin (LIPITOR) 40 MG tablet Take 1 tablet (40 mg total) by mouth daily. 02/17/20   Blanchie Dessert, MD  carisoprodol (SOMA) 350 MG tablet Take 1 tablet (350 mg total) by mouth 3 (three) times daily. 02/17/20   Blanchie Dessert, MD  carvedilol (COREG) 6.25 MG tablet Take 1 tablet (6.25 mg total) by mouth 2 (two) times daily with a meal. 02/17/20   Blanchie Dessert, MD  diazepam (VALIUM) 5 MG tablet Take 5 mg by mouth 2 (two) times daily. 08/07/20   [provider]  furosemide (LASIX) 20 MG tablet Take 20 mg by mouth daily. 07/09/20   [provider]  oxyCODONE-acetaminophen (PERCOCET) 10-325 MG tablet Take 1 tablet by mouth 4 (four) times daily as needed for pain. 08/07/20   [provider]  pantoprazole (PROTONIX) 40 MG tablet Take 1 tablet (40 mg total) by mouth at bedtime. Patient not taking: No sig reported 02/17/20   Blanchie Dessert, MD  SPIRIVA RESPIMAT 2.5 MCG/ACT AERS Inhale 2 puffs into the lungs daily. 07/09/20   [provider]  tamsulosin (FLOMAX) 0.4 MG CAPS capsule Take 1 capsule (0.4 mg total) by mouth daily after supper. 02/17/20   Blanchie Dessert, MD  VENTOLIN HFA 108 (90 Base) MCG/ACT inhaler Inhale 1-2 puffs into the lungs every 6 (six) hours as needed for wheezing or shortness of breath. 02/17/20   Blanchie Dessert, MD     Critical care time: n/a     Kennieth Rad, ACNP [facility] 11/23/2020, 9:32 AM  See  Shea Evans for pager If no response to pager, please call PCCM consult pager After 7:00 pm call Elink

## 2020-11-24 ENCOUNTER — Telehealth: Payer: Self-pay | Admitting: Acute Care

## 2020-11-24 DIAGNOSIS — U071 COVID-19: Secondary | ICD-10-CM | POA: Diagnosis not present

## 2020-11-24 DIAGNOSIS — I340 Nonrheumatic mitral (valve) insufficiency: Secondary | ICD-10-CM

## 2020-11-24 DIAGNOSIS — R0602 Shortness of breath: Secondary | ICD-10-CM

## 2020-11-24 DIAGNOSIS — Z8616 Personal history of COVID-19: Secondary | ICD-10-CM

## 2020-11-24 DIAGNOSIS — I420 Dilated cardiomyopathy: Secondary | ICD-10-CM | POA: Diagnosis not present

## 2020-11-24 DIAGNOSIS — J90 Pleural effusion, not elsewhere classified: Secondary | ICD-10-CM | POA: Diagnosis not present

## 2020-11-24 DIAGNOSIS — I5023 Acute on chronic systolic (congestive) heart failure: Secondary | ICD-10-CM | POA: Diagnosis not present

## 2020-11-24 DIAGNOSIS — I447 Left bundle-branch block, unspecified: Secondary | ICD-10-CM

## 2020-11-24 DIAGNOSIS — I714 Abdominal aortic aneurysm, without rupture: Secondary | ICD-10-CM

## 2020-11-24 DIAGNOSIS — J9601 Acute respiratory failure with hypoxia: Secondary | ICD-10-CM | POA: Diagnosis not present

## 2020-11-24 LAB — BASIC METABOLIC PANEL
Anion gap: 7 (ref 5–15)
BUN: 41 mg/dL — ABNORMAL HIGH (ref 8–23)
CO2: 25 mmol/L (ref 22–32)
Calcium: 7.9 mg/dL — ABNORMAL LOW (ref 8.9–10.3)
Chloride: 104 mmol/L (ref 98–111)
Creatinine, Ser: 2.13 mg/dL — ABNORMAL HIGH (ref 0.61–1.24)
GFR, Estimated: 31 mL/min — ABNORMAL LOW (ref >60)
Glucose, Bld: 149 mg/dL — ABNORMAL HIGH (ref 70–99)
Potassium: 4.1 mmol/L (ref 3.5–5.1)
Sodium: 136 mmol/L (ref 135–145)

## 2020-11-24 LAB — ECHOCARDIOGRAM COMPLETE: S' Lateral: 6 cm

## 2020-11-24 LAB — CBC
HCT: 41.9 % (ref 39.0–52.0)
Hemoglobin: 13.3 g/dL (ref 13.0–17.0)
MCH: 26.4 pg (ref 26.0–34.0)
MCHC: 31.7 g/dL (ref 30.0–36.0)
MCV: 83.3 fL (ref 80.0–100.0)
Platelets: 163 10*3/uL (ref 150–400)
RBC: 5.03 MIL/uL (ref 4.22–5.81)
RDW: 16.8 % — ABNORMAL HIGH (ref 11.5–15.5)
WBC: 6.8 10*3/uL (ref 4.0–10.5)
nRBC: 0 % (ref 0.0–0.2)

## 2020-11-24 LAB — PROCALCITONIN: Procalcitonin: <0.1 ng/mL

## 2020-11-24 LAB — VAS US LOWER EXTREMITY VENOUS (DVT): Single Plane A4C EF: 29.1 %

## 2020-11-24 MED ORDER — AZITHROMYCIN 250 MG PO TABS
500.0000 mg | ORAL_TABLET | Freq: Every day | ORAL | Status: AC
  Administered 2020-11-25 – 2020-11-27 (×3): 500 mg via ORAL
  Filled 2020-11-24 (×3): qty 2

## 2020-11-24 MED ORDER — OXYCODONE-ACETAMINOPHEN 5-325 MG PO TABS
1.0000 | ORAL_TABLET | Freq: Two times a day (BID) | ORAL | Status: DC | PRN
Start: 2020-11-24 — End: 2020-12-04
  Administered 2020-11-24 – 2020-12-02 (×12): 1 via ORAL
  Filled 2020-11-24 (×14): qty 1

## 2020-11-24 NOTE — Progress Notes (Signed)
FPTS Interim Progress Note  S: Patient found lying in bed.  He complains of stable shortness of breath.  O: BP 118/90 (BP Location: Left Arm)   Pulse 72   Temp 99 F (37.2 C) (Oral)   Resp (!) 21   Ht '5\' 5"'$  (1.651 m)   Wt 91.7 kg   SpO2 100%   BMI 33.64 kg/m   VSS: 2 soft BP General: ill appearing male lying in bed Resp: mildly labored respirations, lungs CTAB on brief anterior auscultation CV: RRR, no NMRG, weak peripheral pulses but warm extremities   A/P: -CTM BP -CTM repiratory status -Remainder per primary team  Corky Sox PGY-1, Psychiatry Service pager [phone]

## 2020-11-24 NOTE — Progress Notes (Addendum)
NAME:  Kevin [name], MRN:  [id number], DOB:  02/23/1941, LOS: 0 ADMISSION DATE:  12/09/2020, CONSULTATION DATE:  11/23/2020 REFERRING MD:  Dr. Quentin Cornwall, CHIEF COMPLAINT:  SOB/ pleural effusion    Brief Narrative:  80 year old male with prior history of former tobacco abuse, HTN, HRrEF, AAA, CKD3B, wide complex tachycardia, hx DVT/ PE- not currently on anticoagulants but has IVC filter, hx of chronic SDH, HLD, mod MR, BPH, and Left AKA who presented to [facility] ER on 9/10 with complaints of shortness of breath.     Found to be afebrile, normal saturations with EMS but in Afib and increased work of breathing with expiratory wheeze.  COVID positive on admit. CTA negative PE but revealed mod to large Left pleural effusion.  Additionally showed high grade narrowing of the bronchus intermedius and RML and RLL bronchi, aspiration an or endobronchial lesion, right hilar mass not excluded.  Patient was admitted to [facility] practice teaching service and Pulmonary consulted for further recommendations for CT chest findings.    Underwent thoracentesis at bedside 9/11 with 1400cc's serosanguinous fluid removed    Pertinent  Medical History  Former smoker, ? COPD, HTN, HRrEF, AAA, CKD3B, wide complex tachycardia, hx DVT/ PE with IVC filter, chronic subdural hematoma, HLD, mod MR, BPH   Significant Hospital Events:  9/11 Admitted w/FPTS    Interim History / Subjective:  No acute events overnight  States he feels much better post thora   Objective   Blood pressure 100/77, pulse 72, temperature 98 F (36.7 C), temperature source Oral, resp. rate 19, height '5\' 5"'$  (1.651 m), weight 91.7 kg, SpO2 95 %.        Intake/Output Summary (Last 24 hours) at 11/24/2020 0943 Last data filed at 11/24/2020 [id number] Gross per 24 hour  Intake 591.34 ml  Output 1400 ml  Net -808.66 ml   Filed Weights   11/23/20 1700 11/24/20 0526  Weight: 90.6 kg 91.7 kg     Examination: General: Acute on chronically ill appearing  elderly male lying in bed, in NAD HEENT: [place]/AT, MM pink/moist, PERRL,  Neuro: Alert and oriented x3, non-focal CV: s1s2 regular rate and rhythm, no murmur, rubs, or gallops,  PULM:  Diminished bilaterally, no increased work of breathing, oxygen saturations appropriate on RA GI: soft, bowel sounds active in all 4 quadrants, non-tender, non-distended, tolerating oral diet  Extremities: warm/dry, non-pitting RLE edema, LAKA Skin: no rashes or lesions   Resolved Hospital Problem list     Assessment & Plan:    Pleural effusions -R>L with compressive atelectasis and high grade narrowing of the bronchus intermedius and RML and RLL bronchi, can not exclude pneumonia, aspiration/ debris in airway, or possible obscuring mass (given his smoking history) at this time.  Additionally HF exacerbation could be contributing to pleural effusions.  -Thoracentesis 9/11 with 1400 cc's of serosanguinous fluid removed, cytology pending  COVID positive  -Likely incidental finding  Hx COPD, without formal PFTs Dyspnea -Resolved post thoracentesis  Former Tobacco abuse  Heart failure exacerbation Elevated D-dimer  -CTA without evidence of acute PE P:  Follow cytology (order was not release 9/11 labs still has fluid and will run cytology     today 9/12)  Effusion likely transudative Head of bed elevated 30 degrees.  Ensure adequate pulmonary hygiene  Follow cultures  Continue Decadron and Remdesivir per primary team  Contact precautions  Diurese as able  Follow urine Legionella and strep  Continue BDs ECHO pending  Repeat CT chest to evaluate endobronchial  lesion in 1 month prior to clinic follow up  Follow up in clinic in 1 month will arrange apt today    Remainder per primary. PCCM will sign off. Thank you for the opportunity to participate in this patient's care. Please contact if we can be of further assistance.   Best Practice   Per primary   Critical care time: n/a   Dalaina Tates D. Kenton Kingfisher,  NP-C [facility] Personal contact information can be found on Amion  11/24/2020, 9:26 AM

## 2020-11-24 NOTE — Evaluation (Signed)
Physical Therapy Evaluation Patient Details Name: Kevin [name] MRN: [id number] DOB: 13-Apr-1940 Today's Date: 11/24/2020  History of Present Illness  Pt is an 80 y/o male who presented to ED with SOB and hypoxia. Pt found to be COVID+ and with CHF exacerbation. PMH: HTN, HFmrEF, AAA, L AKA (2013), chronic subdural hematoma, CKD3B, wide complex tachycardia, BPH, hx of DVT, HLD, moderate mitral regurgitation.   Clinical Impression  Pt pleasant and states "I feel so much better. You should've seen all the fluid they took off. It was 30 pounds." Pt presenting with deconditioning and weakness requiring max/totalA for all mobility at this time. Pt states son was lifting him into his w/c at home but he was transferring self to commode from w/c however pt now requiring maxA to maintain EOB sitting. Unsure that son can provide this level of assist. Recommend SNF to allow for patient to achieve safe level of function for son to be able to provide safe and proper care. Acute PT to cont to follow.       Recommendations for follow up therapy are one component of a multi-disciplinary discharge planning process, led by the attending physician.  Recommendations may be updated based on patient status, additional functional criteria and insurance authorization.  Follow Up Recommendations SNF;Supervision/Assistance - 24 hour    Equipment Recommendations  Other (comment) (TBD at next venue)    Recommendations for Other Services       Precautions / Restrictions Precautions Precautions: Fall;Other (comment) Precaution Comments: COVID+, L AKA (does not have prosthetic in room) Restrictions Weight Bearing Restrictions: No      Mobility  Bed Mobility Overal bed mobility: Needs Assistance Bed Mobility: Sit to Supine;Rolling;Sidelying to Sit Rolling: Max assist Sidelying to sit: Max assist;HOB elevated Supine to sit: Mod assist;HOB elevated Sit to supine: Max assist   General bed mobility comments:  max directional verbal and tactile cues to sequence rolling and transfering up to EOB, pt with minimal initiation of L UE use, maxA for trunk elevation    Transfers                 General transfer comment: unable to attempt, will need 2nd person or lift equipment, attempted lateral scooting however pt unable to lift bottom and UEs would give out due to weakness  Ambulation/Gait                Stairs            Wheelchair Mobility    Modified Rankin (Stroke Patients Only)       Balance Overall balance assessment: Needs assistance Sitting-balance support: Feet supported;Bilateral upper extremity supported Sitting balance-Leahy Scale: Poor Sitting balance - Comments: pt hands kept sliding off bed, required using L UE to hold onto rail at foot of bed                                     Pertinent Vitals/Pain Pain Assessment: No/denies pain    Home Living Family/patient expects to be discharged to:: Private residence Living Arrangements: Children Available Help at Discharge: Family;Available 24 hours/day Type of Home: Mobile home Home Access: Ramped entrance     Home Layout: One level Home Equipment: Wheelchair - manual;Bedside commode;Tub bench;Hand held shower head;Walker - 2 wheels;Cane - single point Additional Comments: Son does not work    Prior Function Level of Independence: Needs Water engineer / Transfers Assistance Needed:  pt reports son lifts him in/out of bed into w/c;  can propel self in wheelchair; reports able to transfer to/from toilet without assist. has not been wearing prosthetic in 5 years  ADL's / Homemaking Assistance Needed: son assists with all ADLs        Hand Dominance   Dominant Hand: Right    Extremity/Trunk Assessment   Upper Extremity Assessment Upper Extremity Assessment: Generalized weakness (defer to OT for more details) RUE Deficits / Details: crepitation felt with movement, ROM to about 100*  - stiffness noted LUE Deficits / Details: initial tightness but with ROM stretching improving to 115* flexion. pt reports possible hx of dislocation    Lower Extremity Assessment Lower Extremity Assessment: RLE deficits/detail;LLE deficits/detail RLE Deficits / Details: genrealized weakness, unable to achieve full knee extension, mild contraction, most likley due to sitting in w/c all day for the last 5 years LLE Deficits / Details: pt with AKA    Cervical / Trunk Assessment Cervical / Trunk Assessment: Kyphotic  Communication   Communication: No difficulties  Cognition Arousal/Alertness: Awake/alert Behavior During Therapy: WFL for tasks assessed/performed Overall Cognitive Status: No family/caregiver present to determine baseline cognitive functioning                                 General Comments: pt with delayed response time and difficulty sequencing tasks like pushing up with R UE from sidelying to transfer self to EOB, pt with minimal use of L UE functionally without max verbal and tactile cues      General Comments General comments (skin integrity, edema, etc.): VSS on RA, increased HR into 80s with movement, lots of productive coughing    Exercises General Exercises - Lower Extremity Long Arc Quad: AAROM;Right;10 reps;Seated (pt reports "my knee is locked" PT assist pt with ROM in sitting, unable to achieve full extension due to mild contraction)   Assessment/Plan    PT Assessment Patient needs continued PT services  PT Problem List Decreased strength;Decreased range of motion;Decreased activity tolerance;Decreased balance;Decreased mobility;Decreased coordination       PT Treatment Interventions DME instruction;Gait training;Stair training;Functional mobility training;Balance training;Therapeutic activities;Therapeutic exercise    PT Goals (Current goals can be found in the Care Plan section)  Acute Rehab PT Goals Patient Stated Goal: get better PT  Goal Formulation: With patient Time For Goal Achievement: 12/08/20 Potential to Achieve Goals: Good Additional Goals Additional Goal #1: Pt to be indep with w/c propulsion >100' to improve activity tolerance and ability to mobilize more indep.    Frequency Min 2X/week   Barriers to discharge Decreased caregiver support unsure if son can provide maximal assist    Co-evaluation               AM-PAC PT "6 Clicks" Mobility  Outcome Measure Help needed turning from your back to your side while in a flat bed without using bedrails?: Total Help needed moving from lying on your back to sitting on the side of a flat bed without using bedrails?: Total Help needed moving to and from a bed to a chair (including a wheelchair)?: Total Help needed standing up from a chair using your arms (e.g., wheelchair or bedside chair)?: Total Help needed to walk in hospital room?: Total Help needed climbing 3-5 steps with a railing? : Total 6 Click Score: 6    End of Session   Activity Tolerance: Patient limited by fatigue Patient left: in bed;with  call bell/phone within reach;with bed alarm set;with nursing/sitter in room (in chair position) Nurse Communication: Mobility status PT Visit Diagnosis: Unsteadiness on feet (R26.81);Muscle weakness (generalized) (M62.81);Difficulty in walking, not elsewhere classified (R26.2)    Time: [id number] PT Time Calculation (min) (ACUTE ONLY): 32 min   Charges:   PT Evaluation $PT Eval Moderate Complexity: 1 Mod PT Treatments $Therapeutic Activity: 8-22 mins        Kittie Plater, PT, DPT Acute Rehabilitation Services Pager #: [phone] Office #: [phone]   Berline Lopes 11/24/2020, 9:23 AM

## 2020-11-24 NOTE — TOC Initial Note (Addendum)
Transition of Care [facility]) - Initial/Assessment Note    Patient Details  Name: Kevin [name] MRN: [id number] Date of Birth: 03/30/1940  [facility]) CM/SW Contact:    Trula Ore, [place] Phone Number: 11/24/2020, 3:40 PM  Clinical Narrative:                  CSW received consult for possible SNF placement at time of discharge. CSW spoke with patient regarding PT recommendation of SNF placement at time of discharge. Patient expressed understanding of PT recommendation and is agreeable to SNF placement at time of discharge. Patient comes from home with son.Patient gave CSW permission to fax out initial referral near the [place] area. Patient has received the COVID vaccines. Patient gave CSW permission to discuss his dc plan with his son [name] and sister Zigmund Daniel.No further questions reported at this time. CSW to continue to follow and assist with discharge planning needs.   Expected Discharge Plan: Skilled Nursing Facility Barriers to Discharge: Continued Medical Work up   Patient Goals and CMS Choice Patient states their goals for this hospitalization and ongoing recovery are:: SNF CMS Medicare.gov Compare Post Acute Care list provided to:: Patient Choice offered to / list presented to : Patient  Expected Discharge Plan and Services Expected Discharge Plan: [place] In-house Referral: Clinical Social Work                                            Prior Living Arrangements/Services     Patient language and need for interpreter reviewed:: Yes Do you feel safe going back to the place where you live?: No   SNF  Need for Family Participation in Patient Care: Yes (Comment) Care giver support system in place?: Yes (comment)   Criminal Activity/Legal Involvement Pertinent to Current Situation/Hospitalization: No - Comment as needed  Activities of Daily Living Home Assistive Devices/Equipment: Wheelchair ADL Screening (condition  at time of admission) Patient's cognitive ability adequate to safely complete daily activities?: Yes Is the patient deaf or have difficulty hearing?: No Does the patient have difficulty seeing, even when wearing glasses/contacts?: No Does the patient have difficulty concentrating, remembering, or making decisions?: No Patient able to express need for assistance with ADLs?: Yes Does the patient have difficulty dressing or bathing?: Yes Independently performs ADLs?: No Communication: Independent Dressing (OT): Independent Grooming: Needs assistance Is this a change from baseline?: Pre-admission baseline Feeding: Needs assistance Is this a change from baseline?: Pre-admission baseline Bathing: Needs assistance Is this a change from baseline?: Pre-admission baseline Toileting: Needs assistance Is this a change from baseline?: Pre-admission baseline In/Out Bed: Needs assistance Is this a change from baseline?: Pre-admission baseline Walks in Home: Dependent Is this a change from baseline?: Pre-admission baseline Does the patient have difficulty walking or climbing stairs?: Yes Weakness of Legs: Right Weakness of Arms/Hands: Both  Permission Sought/Granted Permission sought to share information with : Case Manager, Family Supports, Chartered certified accountant granted to share information with : Yes, Verbal Permission Granted     Permission granted to share info w AGENCY: SNF        Emotional Assessment   Attitude/Demeanor/Rapport: Gracious Affect (typically observed): Calm Orientation: : Oriented to Self, Oriented to Place, Oriented to  Time, Oriented to Situation (WDL) Alcohol / Substance Use: Not Applicable Psych Involvement: No (comment)  Admission diagnosis:  Pleural effusion [J90] S/P thoracentesis [  Z98.890] Acute exacerbation of CHF (congestive heart failure) (HCC) [I50.9] Acute exacerbation of congestive heart failure (HCC) [I50.9] Acute respiratory  failure with hypoxia (HCC) [J96.01] Aneurysm of infrarenal abdominal aorta (HCC) [I71.4] Hypertension, unspecified type [I10] COVID [U07.1] Patient Active Problem List   Diagnosis Date Noted   Acute exacerbation of congestive heart failure ([place]) 11/23/2020   Acute exacerbation of CHF (congestive heart failure) ([place]) 11/23/2020   Acute respiratory failure with hypoxia (HCC)    Syncope 01/10/2014   Dehydration 01/10/2014   Bradycardia 99991111   Systolic dysfunction [id number]   Traumatic subdural hemorrhage ([place]) 12/13/2013   Ventricular bigeminy 12/12/2013   Subdural hematoma (HCC) 12/05/2013   Chronic subdural hematoma (HCC) 12/05/2013   Subdural hematoma without coma ([place]) 12/04/2013   Amputee, 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   Renal failure 01/11/2012   Leucocytosis 01/11/2012   DVT, femoral, acute ([place]) 01/11/2012   DVT of axillary vein, acute ([place]) 01/04/2012   Acute pulmonary embolism ([place]) 01/04/2012   Renal mass, right 01/03/2012   Severe sepsis(995.92) 01/02/2012   Septic shock(785.52) 01/02/2012   Hypotension 01/02/2012   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 [facility] [address]Phone: [phone]: [phone]    Social Determinants of Health (SDOH) Interventions    Readmission Risk Interventions No flowsheet data found.

## 2020-11-24 NOTE — Consult Note (Signed)
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      Full                                                        +---------+---------------+---------+-----------+----------+--------------+ POP      Full           Yes      Yes                                 +---------+---------------+---------+-----------+----------+--------------+ PTV      Full                                                        +---------+---------------+---------+-----------+----------+--------------+ PERO     Full                                                        +---------+---------------+---------+-----------+----------+--------------+   +----+---------------+---------+-----------+----------+--------------+ LEFTCompressibilityPhasicitySpontaneityPropertiesThrombus Aging +----+---------------+---------+-----------+----------+--------------+ CFV Full           Yes      Yes                                 +----+---------------+---------+-----------+----------+--------------+     Summary: RIGHT: - There is no evidence of deep vein thrombosis in the lower extremity.  LEFT: - No evidence of common femoral vein obstruction.   *See table(s) above for measurements and observations.    Preliminary     EKG: SR LBBB PVC    ASSESSMENT AND PLAN:   CHF:  History of but poor medical/cardiology f/u. Cr elevated for ARB/ACE BP soft continue lasix 40 mg daily may need right and left cath once other comorbidities stabilized  MR:  Functional does not seem severe to me Exacerbated by recent COVID illness Not a candidate for MV clip at this time  Pulmonary:  Previous smoker with effusion post thoracentesis Needs bronchoscopy once stable for RL.RML endobronchial lesions Cytology pending per pulmonary  COVID:  steroids and Remdisivir on antibiotics for superimposed ? Post obstructive pneumonia Rocephin and Zithromax continue Duoneb and albuterol  PVC;s  chronic LBBB continue coreg LBBB chronic  AAA:  seen by [facility] f/u outpatient consider endovascular repair no abdominal pain   Signed: Jenkins Rouge 11/24/2020, 5:28 PM

## 2020-11-24 NOTE — NC FL2 (Signed)
[place] LEVEL OF CARE SCREENING TOOL     IDENTIFICATION  Patient Name: Kevin [name] Birthdate: [birth year] Sex: male Admission Date (Current Location): 11/26/2020  [facility] and [place] Number:  Herbalist and Address:  [facility] [place] [address], [place], [place] [address]      Provider Number: [id number]  Attending Physician Name and Address:  Leeanne Rio, MD  Relative Name and Phone Number:  [name] [phone]    Current Level of Care: Hospital Recommended Level of Care: [place] Prior Approval Number:    Date Approved/Denied:   PASRR Number: [id number]  Discharge Plan: SNF    Current Diagnoses: Patient Active Problem List   Diagnosis Date Noted   Acute exacerbation of congestive heart failure ([place]) 11/23/2020   Acute exacerbation of CHF (congestive heart failure) ([place]) 11/23/2020   Acute respiratory failure with hypoxia ([place])    Syncope 01/10/2014   Dehydration 01/10/2014   Bradycardia 99991111   Systolic dysfunction [id number]   Traumatic subdural hemorrhage ([place]) 12/13/2013   Ventricular bigeminy 12/12/2013   Subdural hematoma ([place]) 12/05/2013   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   Subdural hematoma without coma ([place]) 12/04/2013   Amputee, 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   Renal failure 01/11/2012   Leucocytosis 01/11/2012   DVT, femoral, acute ([place]) 01/11/2012   DVT of axillary vein, acute ([place]) 01/04/2012   Acute pulmonary embolism ([place]) 01/04/2012   Renal mass, right 01/03/2012   Severe sepsis(995.92) 01/02/2012   Septic shock(785.52) 01/02/2012   Hypotension 01/02/2012   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/2012   Ischemic leg 12/31/2011   HTN (hypertension) 12/31/2011   CAD (coronary artery disease) 12/31/2011   CHF (congestive heart failure) ([place]) 12/31/2011   Tobacco abuse 12/31/2011   Hyperlipidemia 12/31/2011    Orientation RESPIRATION BLADDER Height &  Weight     Self, Time, Situation, Place (WDL)  Normal Continent, External catheter (External Urinary Catheter) Weight: 202 lb 2.6 oz (91.7 kg) Height:  '5\' 5"'$  (165.1 cm)  BEHAVIORAL SYMPTOMS/MOOD NEUROLOGICAL BOWEL NUTRITION STATUS      Continent Diet (Please see discharge summary)  AMBULATORY STATUS COMMUNICATION OF NEEDS Skin   Extensive Assist Verbally Other (Comment) (Abrasion arm,leg,right,left,lower)                       Personal Care Assistance Level of Assistance  Bathing, Feeding, Dressing Bathing Assistance: Limited assistance Feeding assistance: Independent Dressing Assistance: Limited assistance     Functional Limitations Info  Sight, Hearing, Speech   Hearing Info: Adequate Speech Info: Adequate    SPECIAL CARE FACTORS FREQUENCY  PT (By licensed PT), OT (By licensed OT)     PT Frequency: 5x min weekly OT Frequency: 5x min weekly            Contractures Contractures Info: Not present    Additional Factors Info  Code Status, Allergies, Isolation Precautions Code Status Info: FULL Allergies Info: No known allergies     Isolation Precautions Info: Covid + onset date 12/05/2020     Current Medications (11/24/2020):  This is the current hospital active medication list Current Facility-Administered Medications  Medication Dose Route Frequency Provider Last Rate Last Admin   acetaminophen (TYLENOL) tablet 650 mg  650 mg Oral Q4H PRN Simmons-Robinson, Makiera, MD   650 mg at 11/23/20 1722   albuterol (VENTOLIN HFA) 108 (90 Base) MCG/ACT inhaler 2 puff  2 puff Inhalation BID Leeanne Rio, MD   2  puff at 11/24/20 0907   atorvastatin (LIPITOR) tablet 40 mg  40 mg Oral Daily Simmons-Robinson, Makiera, MD   40 mg at 11/24/20 0919   [START ON 11/25/2020] azithromycin (ZITHROMAX) tablet 500 mg  500 mg Oral Daily Gladys Damme, MD       carvedilol (COREG) tablet 6.25 mg  6.25 mg Oral BID WC Simmons-Robinson, Makiera, MD   6.25 mg at 11/24/20 0908    cefTRIAXone (ROCEPHIN) 2 g in sodium chloride 0.9 % 100 mL IVPB  2 g Intravenous Q24H Jennelle Human B, NP 200 mL/hr at 11/24/20 1022 2 g at 11/24/20 1022   dexamethasone (DECADRON) tablet 6 mg  6 mg Oral Daily Simmons-Robinson, Makiera, MD   6 mg at 11/24/20 0919   enoxaparin (LOVENOX) injection 30 mg  30 mg Subcutaneous Daily Simmons-Robinson, Makiera, MD   30 mg at 11/24/20 0919   ipratropium-albuterol (DUONEB) 0.5-2.5 (3) MG/3ML nebulizer solution 3 mL  3 mL Nebulization Q4H PRN Margaretha Seeds, MD       ondansetron [facility]) injection 4 mg  4 mg Intravenous Q6H PRN Simmons-Robinson, Makiera, MD       oxyCODONE-acetaminophen (PERCOCET/ROXICET) 5-325 MG per tablet 1 tablet  1 tablet Oral BID PRN Gladys Damme, MD   1 tablet at 11/24/20 1301   remdesivir 100 mg in sodium chloride 0.9 % 100 mL IVPB  100 mg Intravenous Daily Simmons-Robinson, Makiera, MD 200 mL/hr at 11/24/20 0932 100 mg at 11/24/20 0932   tamsulosin (FLOMAX) capsule 0.4 mg  0.4 mg Oral QPC supper Simmons-Robinson, Makiera, MD   0.4 mg at 11/23/20 1722   umeclidinium bromide (INCRUSE ELLIPTA) 62.5 MCG/INH 1 puff  1 puff Inhalation Daily Simmons-Robinson, Makiera, MD   1 puff at 11/24/20 0907     Discharge Medications: Please see discharge summary for a list of discharge medications.  Relevant Imaging Results:  Relevant Lab Results:   Additional Information [phone], Covid + onset date 11/20/2020  Trula Ore, [place]

## 2020-11-24 NOTE — Progress Notes (Addendum)
Talked with patient's son Aryn Haffey updated contact information on chart. Pt's son stated would like to speak with Education officer, museum and MD about pt. Cell phone number [phone].

## 2020-11-24 NOTE — Telephone Encounter (Signed)
Gerald Leitz D, NP  Lbpu Triage Pool 1 hour ago (10:32 AM)   Please arrange for hospital follow up in 1 month with Dr. Silas Flood, Erin Fulling, or [place]. Patient will need non contrasted chest CT prior to this appointments, please help in placing that order. Thanks!    With pt still being admitted to the hospital, I have scheduled pt an appt with Dr. Silas Flood 12/29/20 at 1:30 for a hospital follow up. Note was made that CT will need to be done prior. Order for the CT has also been placed under Dr. Silas Flood since that is who the pt will be seeing and have made comment that it needs to be performed before the f/u.   Pt's HFU appt should show up on pt's AVS once he is discharged from the hospital. Nothing further needed.

## 2020-11-24 NOTE — Progress Notes (Signed)
Family Medicine Teaching Service Daily Progress Note Intern Pager: [phone]  Patient name: Kevin [name] Medical record number: [id number] Date of birth: [birth year] Age: 80 y.o. Gender: male  Primary Care Provider: Daphene Jaeger, PA-C Consultants: Neurology, vascular surgery(signed off) Code Status: Full code  Pt Overview and Major Events to Date:  9/11 admitted, thoracentesis performed  Assessment and Plan: 80 yo male presenting with shortness of breath and hypoxia. PMH significant for HTN, HFrEF, AAA, chronic subdural hematoma, CKD3B, wide complex tachycardia, BPH, hx of DVT, HLD, moderate mitral regurgitation.  Hypoxic Respiratory Failure in setting of HFmrEF Exacerbation & Covid-19 He feels his breathing has greatly improved since yesterday and is feeling well in terms of his breathing. Troponins trended overall flat (101>94>132>112>118).  Repeat chest x-ray was improved. 202.16 pounds from 199.74 pounds yesterday. UOP 1.4 L. Echo complete, showed 20-25% EF with global hypokinesis with MR not well interrogated but may be severe TEE could further assess this. -f/u Cardiology about TEE -Currently still on 2L O2, wean as tolerated -keep pulse ox greater than 88% -lasix 40 mg x 2 doses. Improved  with thoracentesis -Strict I's and O's -albuterol inhaler -incruse ellipta inhaler daily -duoneb neb prn   Pleural effusions, moderate s/p thoracentesis Today patient's breathing has been greatly improved, patient received imaging guided thoracentesis yesterday with 1400 cc of serosanguineous fluid drained from right pleural space. "Felt like they took off 30 pounds." Repeat chest x-ray showed decreased bilateral pleural fluid and bibasilar airspace opacity. Light's criteria for pleural fluid was transudative, likely related to heart failure. -Pulmonology outpatient f/u  -Continue supplemental oxygenation  Covid 19 Chronic cough has been decreased from yesterday and COVID+ with less  sputum. Has remained afebrile. -Remdesivir day 2 -Decadron 6 mg oral daily x 10 days -Wean O2 as tolerated -Incentive spirometry  -prn zofran -azithro 5 day oral course, ceftriaxone  Wide-complex tachycardia EKG consistent on admission.  Patient has not been having chest pain or palpitations.  Heart rates are 62-88. Repeat EKG was similar to previous.  -Cardiac monitoring -home Coreg 6.25 twice daily  AAA with IVC Filter -Does not require therapeutic anticoagulation per vascular, continue lovenox 30 mg subq -Vascular to follow up outpatient for endovascular repair once patient stabilized from acute infection  Elevated D-Dimer  6.66 on admission. CTA negative for PE but had the bronchial occlusion on right. DVT US RLE negative for DVT or femoral vein obstruction on L. Pain on left AKA site chronic phantom leg pain not changed from previously. -oxycodone-acetaminophen 5-325 bid prn  AKI on CKD Cr 1.85 on admission and as high as 2.30 in 2019. Not following with nephrology outpatient. Does have renal mass on abd/pelvis CT. UOP 1.4 L. -Cr 2.13, currently on lasix -monitor UOP  HTN Was hypertensive to 175/107 on admission. Have been 120s/90s mostly and 100/77 this AM.  -continue home coreg -monitor BP with vital signs  BPH -Continue home flomax  HLD -continue home atorvastatin  FEN/GI: Heart  PPx: lovenox 30 mg Dispo:SNF pending clinical improvement . Barriers include continued medical work up.   Subjective:  Feeling much better today breathing wise and only complains of his chronic left phantom pain.   Objective: Temp:  [97.3 F (36.3 C)-98.3 F (36.8 C)] 98 F (36.7 C) (09/12 0526) Pulse Rate:  [62-88] 72 (09/12 0526) Resp:  [15-31] 19 (09/12 0526) BP: (100-137)/(73-112) 100/77 (09/12 0526) SpO2:  [95 %-100 %] 95 % (09/12 0526) Weight:  [90.6 kg-91.7 kg] 91.7 kg (09/12 0526) Physical Exam: General:  NAD, awake, alert, responsive to questions Head: Normocephalic  atraumatic CV: Regular rate and rhythm no murmurs rubs or gallops Respiratory: no increased work of breathing, no crackles auscultated Abdomen: Soft, non-tender, non-distended, normoactive bowel sounds  Extremities: LE without edema, chronic venous stasis wounds present, left AKA Neuro: No focal deficits  Laboratory: Recent Labs  Lab 11/15/2020 1644 11/23/20 0431 11/24/20 0218  WBC 4.4 3.9* 6.8  HGB 14.9 14.1 13.3  HCT 49.6 46.1 41.9  PLT 181 186 163   Recent Labs  Lab 11/17/2020 1644 11/23/20 0431 11/23/20 1744 11/24/20 0218  NA 138 135  --  136  K 3.6 4.2  --  4.1  CL 103 104  --  104  CO2 24 23  --  25  BUN 24* 26*  --  41*  CREATININE 1.85* 1.82*  --  2.13*  CALCIUM 8.2* 8.0*  --  7.9*  PROT 6.3*  --  5.9*  --   BILITOT 1.1  --   --   --   ALKPHOS 74  --   --   --   ALT 11  --   --   --   AST 15  --   --   --   GLUCOSE 112* 163*  --  149*    Imaging/Diagnostic Tests: DG Chest Port 1 View  Result Date: 11/23/2020 CLINICAL DATA:  Chest tightness and shortness of breath. COVID-19 positive. EXAM: PORTABLE CHEST 1 VIEW COMPARISON:  11/19/2020 FINDINGS: The patient remains significantly rotated to the right with no significant change in enlargement of the cardiac silhouette. Decreased bilateral pleural fluid and bibasilar airspace opacity. Stable metallic pellets. No acute bony abnormality. IMPRESSION: Improving changes of congestive heart failure. Electronically Signed   By: Claudie Revering M.D.   On: 11/23/2020 16:49     Gerrit Heck, MD 11/24/2020, 9:35 AM PGY-1, [place] Intern pager: [phone], text pages welcome

## 2020-11-24 NOTE — Evaluation (Addendum)
Occupational Therapy Evaluation Patient Details Name: Kevin [name] MRN: [id number] DOB: May 07, 1940 Today's Date: 11/24/2020   History of Present Illness Pt is an 80 y/o male who presented to ED with SOB and hypoxia. Pt found to be COVID+ and with CHF exacerbation. PMH: HTN, HFmrEF, AAA, L AKA (2013), chronic subdural hematoma, CKD3B, wide complex tachycardia, BPH, hx of DVT, HLD, moderate mitral regurgitation.   Clinical Impression   PTA, pt lives with son and reports use of wheelchair primarily for mobility (has not been wearing prosthetic LE). Pt reports son assists with transfers in/out of bed, LB dressing and shower transfers. Pt reports able to self propel wheelchair, perform toileting tasks/transfers and bathing once in shower with Modified Independence. Pt presents with below baseline with deficits noted below. Pt overall Mod A for bed mobility, unable to successfully scoot along bedside or attempt transfers with 1 person assist. Pt overall Setup for UB ADLs and up to Total A for LB ADLs. At this time, would recommend SNF rehab to maximize independence and reduce caregiver burden. Pt hopeful to progress to home with [facility] services.   VSS on RA.       Recommendations for follow up therapy are one component of a multi-disciplinary discharge planning process, led by the attending physician.  Recommendations may be updated based on patient status, additional functional criteria and insurance authorization.   Follow Up Recommendations  SNF;Supervision/Assistance - 24 hour;Other (comment) (could progress to [facility] if son comfortable in providing the physical assist pt requires)    Equipment Recommendations  Wheelchair (measurements OT);Wheelchair cushion (measurements OT);Hospital bed (pt reports his wheelchair & cushion are worn out)    Recommendations for Other Services       Precautions / Restrictions Precautions Precautions: Fall;Other (comment) Precaution Comments: COVID+, L AKA (does  not have prosthetic in room) Restrictions Weight Bearing Restrictions: No      Mobility Bed Mobility Overal bed mobility: Needs Assistance Bed Mobility: Supine to Sit;Sit to Supine     Supine to sit: Mod assist;HOB elevated Sit to supine: Min assist   General bed mobility comments: Mod A with increased time/effort to get EOB. Assist to scoot hips to EOB, pt able to pull to lift trunk with decent UB strength. able to return to supine with Min A to get L LE back into bed    Transfers                 General transfer comment: attempted lateral scooting along bedside though pt unable to generate enough power to clear hips for task. Unable to attempt OOB transfer without +2 assist this AM    Balance Overall balance assessment: Needs assistance Sitting-balance support: No upper extremity supported;Feet supported Sitting balance-Leahy Scale: Poor Sitting balance - Comments: initially reliant on UE support sitting EOB, able to progress to one UE support                                   ADL either performed or assessed with clinical judgement   ADL Overall ADL's : Needs assistance/impaired Eating/Feeding: Set up;Sitting   Grooming: Set up;Sitting   Upper Body Bathing: Set up;Sitting   Lower Body Bathing: Maximal assistance;Sitting/lateral leans;Bed level   Upper Body Dressing : Set up;Sitting   Lower Body Dressing: Total assistance;Sitting/lateral leans;Bed level Lower Body Dressing Details (indicate cue type and reason): Total A to don sock (difficulty with knee ROM) - reports son  assists with LB dressing at home     Toileting- Clothing Manipulation and Hygiene: Total assistance;Bed level         General ADL Comments: Pt limited by decreased strength and sitting balance, overall unable to scoot along bedside or transfer without +2 assist this AM requiring extensive assist for LB ADLs     Vision Ability to See in Adequate Light: 0 Adequate Patient  Visual Report: No change from baseline Vision Assessment?: No apparent visual deficits     Perception     Praxis      Pertinent Vitals/Pain Pain Assessment: No/denies pain     Hand Dominance Right   Extremity/Trunk Assessment Upper Extremity Assessment Upper Extremity Assessment: RUE deficits/detail;LUE deficits/detail RUE Deficits / Details: crepitation felt with movement, ROM to about 100* - stiffness noted LUE Deficits / Details: initial tightness but with ROM stretching improving to 115* flexion. pt reports possible hx of dislocation   Lower Extremity Assessment Lower Extremity Assessment: Defer to PT evaluation   Cervical / Trunk Assessment Cervical / Trunk Assessment: Kyphotic   Communication Communication Communication: No difficulties   Cognition Arousal/Alertness: Awake/alert Behavior During Therapy: WFL for tasks assessed/performed Overall Cognitive Status: No family/caregiver present to determine baseline cognitive functioning                                 General Comments: pleasant, cooperative, some slower processing time but overall WFL and likely at baseline. Able to converse about hometime area with therapist (accurate info)   General Comments  VSS on RA (noted with tele monitor indicating non sustained runs of v tach; pt asymptomatic); coughing during session    Exercises     Shoulder Instructions      Home Living Family/patient expects to be discharged to:: Private residence Living Arrangements: Children Available Help at Discharge: Family;Available 24 hours/day Type of Home: Mobile home Home Access: Ramped entrance     Home Layout: One level     Bathroom Shower/Tub: Tub/shower unit;Walk-in shower   Bathroom Toilet: Handicapped height (BSC over toilet)     Home Equipment: Wheelchair - manual;Bedside commode;Tub bench;Hand held shower head;Walker - 2 wheels;Cane - single point   Additional Comments: Son does not work       Prior Functioning/Environment Level of Independence: Needs Product/process development scientist / Transfers Assistance Needed: Son assists with getting in/out of bed; sounds like pt does squat pivots to/from wheelchair. can propel self in wheelchair; reports able to transfer to/from toilet without assist. has not been wearing prosthetic LE lately ADL's / Homemaking Assistance Needed: Son assists with LB dressing, tub transferrs. pt able to toilet self and bathe self once in shower; clenaing service assists with dishes, cooking and laundry            OT Problem List: Decreased strength;Decreased activity tolerance;Impaired balance (sitting and/or standing)      OT Treatment/Interventions: Self-care/ADL training;Therapeutic exercise;Energy conservation;DME and/or AE instruction;Therapeutic activities;Patient/family education;Balance training    OT Goals(Current goals can be found in the care plan section) Acute Rehab OT Goals Patient Stated Goal: go home, get new wheelchair and cushion OT Goal Formulation: With patient Time For Goal Achievement: 12/08/20 Potential to Achieve Goals: Good  OT Frequency: Min 2X/week   Barriers to D/C:            Co-evaluation              AM-PAC OT "6 Clicks" Daily Activity  Outcome Measure Help from another person eating meals?: A Little Help from another person taking care of personal grooming?: A Little Help from another person toileting, which includes using toliet, bedpan, or urinal?: Total Help from another person bathing (including washing, rinsing, drying)?: A Lot Help from another person to put on and taking off regular upper body clothing?: A Little Help from another person to put on and taking off regular lower body clothing?: Total 6 Click Score: 13   End of Session Nurse Communication: Mobility status  Activity Tolerance: Patient tolerated treatment well Patient left: in bed;with call bell/phone within reach;with bed alarm set  OT Visit  Diagnosis: Unsteadiness on feet (R26.81);Other abnormalities of gait and mobility (R26.89);Muscle weakness (generalized) (M62.81)                Time: [id number] OT Time Calculation (min): 32 min Charges:  OT General Charges $OT Visit: 1 Visit OT Evaluation $OT Eval Moderate Complexity: 1 Mod OT Treatments $Therapeutic Activity: 8-22 mins  Malachy Chamber, OTR/L Acute Rehab Services Office: [phone]   Layla Maw 11/24/2020, 8:19 AM

## 2020-11-24 NOTE — Hospital Course (Addendum)
Hospital course 80 year old Kevin [name] who presented to the ED with shortness of breath and hypoxia.  Past medical history significant for hypertension, HFrEF, AAA, chronic subdural hematoma, CKD 3B, wide-complex tachycardia, BPH, history of DVT hyperlipidemia, mitral regurgitation.  Hypoxic respiratory failure with  HFrEF with a EF of 20 to 25%  Patient presented to the ED with shortness of breath while at home.  He has a chronic cough that worsened in the past few days.  BNP on admission was greater than 1300.  Shortness of breath improved with nebulizer treatments and supplemental oxygen.   He denied any fevers sore throat, muscle pain. Has not needed any oxygen at home prior 2 L in ED. repeat echo showed EF of 20 to 25% moderate to severe mitral regurgitation.  He was given Lasix per cardiology for diuresis.  Carvedilol was continued.  Electrolytes were repleted and maintained with potassium greater than 4 and magnesium greater than 2. Initial weight was 199.74 pounds At end of hospitalization weight was 183.64 pounds He was diuresed 11.9 liters  Pleural effusions, moderate  occlusion of bronchus Noted on x-ray and chest CTA.  Which was larger on the right side than left.  There was high-grade narrowing occlusion of the bronchus intermedius and right middle and lower lobe bronchi concerning for aspiration versus endobronchial lesion versus right hilar mass. Pulomonology was consulted and recommended thoracentesis which was performed on 9/11 with 1400 cc of serosanguineous fluid removed, and cytology that showed transudative fluid likely related to heart failure.  They recommended ceftriaxone and azithromycin to cover bacterial coinfection alongside COVID which was completed in the hospital.  Patient's breathing improved after this.  Recommended repeat chest CT to evaluate endobronchial lesion in 1 month prior to clinic follow-up.  COVID-19 Completed remdesivir course in hospital with Decadron  6 mg orally daily for 10 days. Cough is improved day after discharge.  Elevated Troponins  Trended 101>94>91>132>112.  Was unlikely to be ACS, had no chest pain on admission, and has chronic left BPPV with PVCs.  Cardiology saw and believed it to be suspected demand ischemia in the setting of acute hypoxic respiratory failure/CHF exacerbation.  Recommended continuing his statin and beta-blocker and ischemic evaluation outpatient.  Mitral regurgitation Believed to be moderate to severe on echo during this admission. Did not require TEE to evaluate per cardiology and was felt to be mostly functional in the setting of CHF exacerbation and COVID-19 illness.  He was not a candidate for MV clip per cardiology.  Will need ongoing outpatient monitoring.  Wide-complex tachycardia EKG consistent with this on this admission.  Patient did not have chest pain.  He did have intermittent palpitations.  This is a chronic issue LBBB and Coreg was continued.  History of DVTs with IVC filter D-dimer 6.66 on admission.  CTA negative for PE.  DVT ultrasound of right lower extremity and femoral vein on left AKA was negative.  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  AKI on CKD  Had creatinine of 1.85 on admission and had creatinine as high as 2.30 in 2019.  Does not follow with nephrology outpatient.  Does have a renal mass on abdominal pelvis CT.  Was monitored while on Lasix.  Creatinine at the end of hospitalization was 1.95.  AAA 6.2 cm on CT in 09/01/2020.  Was seen by vascular surgery who recommended outpatient follow-up for her EVAR consideration.  Did not have abdominal or back pain.  Hypertension Was  continued on his home Coreg.  No hypertensive emergencies or emergencies during this hospitalization.  Hyperlipidemia Was continued on his home atorvastatin.  Last lipid panel in 2013.  BPH Home Flomax was continued.  Issue for follow up Ensure patient follows up  with vascular outpatient for EVAR consideration Patient has outpatient pulmonology follow up with Dr. Silas Flood on 10/17 at 1:30 for repeat CT chest imaging in 1 month. Will need repeat lipid panel, last LDL in 2013 Outpatient cardiology follow up.

## 2020-11-25 ENCOUNTER — Other Ambulatory Visit: Payer: Self-pay

## 2020-11-25 ENCOUNTER — Other Ambulatory Visit (HOSPITAL_COMMUNITY): Payer: Self-pay

## 2020-11-25 DIAGNOSIS — I5023 Acute on chronic systolic (congestive) heart failure: Secondary | ICD-10-CM | POA: Diagnosis not present

## 2020-11-25 LAB — BASIC METABOLIC PANEL
Anion gap: 10 (ref 5–15)
BUN: 50 mg/dL — ABNORMAL HIGH (ref 8–23)
CO2: 22 mmol/L (ref 22–32)
Calcium: 7.9 mg/dL — ABNORMAL LOW (ref 8.9–10.3)
Chloride: 102 mmol/L (ref 98–111)
Creatinine, Ser: 2.24 mg/dL — ABNORMAL HIGH (ref 0.61–1.24)
GFR, Estimated: 29 mL/min — ABNORMAL LOW (ref >60)
Glucose, Bld: 115 mg/dL — ABNORMAL HIGH (ref 70–99)
Potassium: 4.4 mmol/L (ref 3.5–5.1)
Sodium: 134 mmol/L — ABNORMAL LOW (ref 135–145)

## 2020-11-25 LAB — LEGIONELLA PNEUMOPHILA SEROGP 1 UR AG: L. pneumophila Serogp 1 Ur Ag: NEGATIVE

## 2020-11-25 MED ORDER — FUROSEMIDE 10 MG/ML IJ SOLN
40.0000 mg | Freq: Two times a day (BID) | INTRAMUSCULAR | Status: DC
  Administered 2020-11-25 – 2020-11-26 (×4): 40 mg via INTRAVENOUS
  Filled 2020-11-25 (×4): qty 4

## 2020-11-25 NOTE — Progress Notes (Signed)
Kevin [name] has been confused since the start of the shift. Dr. Alvie Heidelberg saw Kevin [name] at bedside earlier in shift. Kevin [name] is now more confused, anxious, hollering out, and crying stated that someone was "trying to kill me." Called son [name] back to talk to Kevin [name] to calm Kevin [name] down. Kevin [name] is also talking with daughter, [name] on phone at bedside. Paged Dr. Alvie Heidelberg about worsening issues with Kevin [name]. Awaiting callback.

## 2020-11-25 NOTE — TOC Progression Note (Signed)
Transition of Care [facility]) - Progression Note    Patient Details  Name: Kevin [name] MRN: [id number] Date of Birth: 27-Jun-1940  Transition of Care [facility]) CM/SW Contact  Reece Agar, [place] Phone Number: 11/25/2020, 3:23 PM  Clinical Narrative:    CSW attempted to contact pt son Kevin [name] to provide bed offers.There was no answer, CSW will follow up with Kevin [name] tomorrow for confirmation of bed.   Expected Discharge Plan: [place] Barriers to Discharge: Continued Medical Work up  Expected Discharge Plan and Services Expected Discharge Plan: [place] In-house Referral: Clinical Social Work                                             Social Determinants of Health (SDOH) Interventions    Readmission Risk Interventions No flowsheet data found.

## 2020-11-25 NOTE — Progress Notes (Signed)
Progress Note  Patient Name: Kevin [name] Date of Encounter: 11/25/2020  [facility] Cardiologist: Johnsie Cancel   Subjective   80 year old gentleman who was admitted with respiratory difficulties.  He was found of COVID.  He had bilateral pleural effusions.  CTA of the chest was negative for pulmonary embolus but did note is 6 cm abdominal aortic aneurysm.  Is a question of a right hilar mass.  Echocardiogram from September 11 reveals severely depressed left ventricular systolic function with an EF of 20 to 25%.  There is mild LVH.  He has grade 2 diastolic dysfunction.  He has been on Lasix.  He is diuresed 1.9 L so far during this admission.  He does not feel any better - no improvement in breathing  O2 sat is 96%  Inpatient Medications    Scheduled Meds:  albuterol  2 puff Inhalation BID   atorvastatin  40 mg Oral Daily   azithromycin  500 mg Oral Daily   carvedilol  6.25 mg Oral BID WC   dexamethasone  6 mg Oral Daily   enoxaparin (LOVENOX) injection  30 mg Subcutaneous Daily   tamsulosin  0.4 mg Oral QPC supper   umeclidinium bromide  1 puff Inhalation Daily   Continuous Infusions:  cefTRIAXone (ROCEPHIN)  IV 2 g (11/25/20 0914)   remdesivir 100 mg in NS 100 mL 100 mg (11/25/20 0913)   PRN Meds: acetaminophen, ipratropium-albuterol, ondansetron (ZOFRAN) IV, oxyCODONE-acetaminophen   Vital Signs    Vitals:   11/24/20 2350 11/25/20 0600 11/25/20 0630 11/25/20 0900  BP: 124/88  (!) 123/95 113/73  Pulse: 68  71 65  Resp: 18  (!) 21 20  Temp: 97.8 F (36.6 C)  97.9 F (36.6 C) 98 F (36.7 C)  TempSrc: Oral  Oral Oral  SpO2: 100%  100% 96%  Weight:  93.3 kg    Height:        Intake/Output Summary (Last 24 hours) at 11/25/2020 1002 Last data filed at 11/25/2020 0900 Gross per 24 hour  Intake 800 ml  Output 1075 ml  Net -275 ml   Last 3 Weights 11/25/2020 11/24/2020 11/23/2020  Weight (lbs) 205 lb 11 oz 202 lb 2.6 oz 199 lb 11.8 oz  Weight (kg) 93.3 kg  91.7 kg 90.6 kg      Telemetry    Sinus rhythm at 68  - Personally Reviewed  ECG     - Personally Reviewed  Physical Exam  Extremely weak.  Needed lots of help sitting up    Neck: No JVD Cardiac:  RR  Respiratory: bilat rales  GI: Soft, nontender, non-distended  MS: s/p left leg amputation,   R leg, 1-2 + edema,  chronic stasis changes.  Neuro:  Nonfocal  Psych: Normal affect   Labs    High Sensitivity Troponin:   Recent Labs  Lab 12/08/2020 1821 11/23/20 0134 11/23/20 0431 11/23/20 1218 11/23/20 1744  TROPONINIHS 94* 91* 132* 112* 118*      Chemistry Recent Labs  Lab 11/21/2020 1644 11/23/20 0431 11/23/20 1744 11/24/20 0218 11/25/20 0259  NA 138 135  --  136 134*  K 3.6 4.2  --  4.1 4.4  CL 103 104  --  104 102  CO2 24 23  --  25 22  GLUCOSE 112* 163*  --  149* 115*  BUN 24* 26*  --  41* 50*  CREATININE 1.85* 1.82*  --  2.13* 2.24*  CALCIUM 8.2* 8.0*  --  7.9* 7.9*  PROT  6.3*  --  5.9*  --   --   ALBUMIN 2.4*  --   --   --   --   AST 15  --   --   --   --   ALT 11  --   --   --   --   ALKPHOS 74  --   --   --   --   BILITOT 1.1  --   --   --   --   GFRNONAA 36* 37*  --  31* 29*  ANIONGAP 11 8  --  7 10     Hematology Recent Labs  Lab 12/07/2020 1644 11/23/20 0431 11/24/20 0218  WBC 4.4 3.9* 6.8  RBC 5.75 5.46 5.03  HGB 14.9 14.1 13.3  HCT 49.6 46.1 41.9  MCV 86.3 84.4 83.3  MCH 25.9* 25.8* 26.4  MCHC 30.0 30.6 31.7  RDW 17.3* 17.2* 16.8*  PLT 181 186 163    BNP Recent Labs  Lab 11/16/2020 1644  BNP 1,386.0*     DDimer  Recent Labs  Lab 11/24/2020 1735  DDIMER 6.66*     Radiology    DG Chest Port 1 View  Result Date: 11/23/2020 CLINICAL DATA:  Chest tightness and shortness of breath. COVID-19 positive. EXAM: PORTABLE CHEST 1 VIEW COMPARISON:  12/09/2020 FINDINGS: The patient remains significantly rotated to the right with no significant change in enlargement of the cardiac silhouette. Decreased bilateral pleural fluid and bibasilar  airspace opacity. Stable metallic pellets. No acute bony abnormality. IMPRESSION: Improving changes of congestive heart failure. Electronically Signed   By: Claudie Revering M.D.   On: 11/23/2020 16:49    Cardiac Studies   Echocardiogram 11/23/20: 1. MR not well interrogated but may be severe; suggest TEE to further  assess if clinically indicated.   2. Left ventricular ejection fraction, by estimation, is 20 to 25%. The  left ventricle has severely decreased function. The left ventricle  demonstrates global hypokinesis. The left ventricular internal cavity size  was severely dilated. There is mild  left ventricular hypertrophy. Left ventricular diastolic parameters are  consistent with Grade II diastolic dysfunction (pseudonormalization).   3. Right ventricular systolic function is normal. The right ventricular  size is normal.   4. Left atrial size was mildly dilated.   5. The mitral valve is normal in structure. Moderate to severe mitral  valve regurgitation. No evidence of mitral stenosis.   6. Tricuspid valve regurgitation is moderate.   7. The aortic valve is normal in structure. Aortic valve regurgitation is  not visualized. No aortic stenosis is present.   8. The inferior vena cava is normal in size with greater than 50%  respiratory variability, suggesting right atrial pressure of 3 mmHg.   FINDINGS   Left Ventricle: Left ventricular ejection fraction, by estimation, is 20  to 25%. The left ventricle has severely decreased function. The left  ventricle demonstrates global hypokinesis. The left ventricular internal  cavity size was severely dilated.  There is mild left ventricular hypertrophy. Left ventricular diastolic  parameters are consistent with Grade II diastolic dysfunction  (pseudonormalization).   Patient Profile     80 y.o. male with a PMH of chronic combined CHF, CAD (moderate disease on cath in 2007), HTN, HLD, mitral regurgitation, AAA (6.2 cm 08/2020), COPD,  gangrene of left leg s/p AKA in 2013, history of PE/DVT, CKD stage 3b, and tobacco abuse, who presented with acute hypoxemic respiratory failure in the setting of pleural effusions, compressive  atelectasis, and COVID-19 with bacterial PNA, who is being followed by cardiology for the evaluation of acute on chronic combined CHF.   Assessment & Plan    1. Acute hypoxic respiratory failure: Presented with shortness of breath.  He was found to have COVID-19.  He had bilateral pleural effusions on chest x-ray.  CTA chest did not reveal PE though did note occlusion of right bronchus intermedius and right middle/lower lobe bronchi.  He was seen by pulmonology and recommended for diuresis and antibiotics for suspected bacterial coinfection.  He underwent thoracentesis with 1400 cc fluid removed from right pleural space.  Await cytology and culture data. - Continue steroids and remdesivir per primary team  - Continue antibiotics per primary team - Pulm recommends repeat CT chest in 1 month  2. Acute on chronic combined CHF: has known longstanding history of CHF dating back to 2007 with EF 20%; lost to follow-up with cardiology since 2015. He presented 11/26/2020 with SOB.  CHF likely contributing to #1.  Echocardiogram this admission showed EF 20-25% global hypokinesis, severe LV dilation, mild LVH, G2 DD, mild LAE, and moderate to severe MR. He was diuresing with IV lasix '40mg'$  daily, held 11/25/20 due to rising Cr. UOP net -534m in the past 24 hours and -2.1L this admission; weight continues to rise from 199lbs on admission up to 205lbs today. AoCKD limits GDMT - Low output CHF??  - Continue carvedilol - Continue to monitor strict I&Os and daily weights - Continue to monitor electrolytes and replete as needed to maintain K >4, Mg >2  3. Elevated HsTrop in patient with CAD: known moderate CAD on cath in 2007. HsTrop peaked at 132 and trended down; trend not c/w ACS. Suspect demand ischemia in the setting of #1/2.  EKG with chronic LBBB.  - Continue statin and Bblocker - Ischemic evaluation?   4. Mitral regurgitation: moderate-severe on echo this admission. Felt to be somewhat functional in the setting of CHF exacerbation and COVID-19 illness.  - Continue volume management above - Will need ongoing outpatient monitoring  5. AAA: 6.2 cm on CT 08/2020. Seen by [facility] and recommended for outpatient followup for EVAR consideration.  - Continue with plans for close outpatient [facility]VS follow-up  6. HTN: BP stable - Continue coreg  7. HLD: No recent lipids - Continue atorvastatin         For questions or updates, please contact CNomePlease consult [email or web address] for contact info under        Signed, KAbigail Butts PA-C  11/25/2020, 10:02 AM     Attending Note:   The patient was seen and examined.  Agree with assessment and plan as noted above.  Changes made to the above note as needed.  Patient seen and independently examined with KRoby Lofts PA .   We discussed all aspects of the encounter. I agree with the assessment and plan as stated above.   Acute on chronic combined systolic and diastolic congestive heart failure: He has an extremely low ejection fraction.  He has diuresed 1.9 L according to our recorded volumes.  .Marland Kitchen His weight however has increased.  His weight is recorded at 93.3 kg today.  Creatinine has increased slightly to 2.24.  It looks like he received furosemide for a couple of days but then has not received any in a couple days.  We will restart Lasix 40 mg twice a day.  2. MR :   continue diuresis   3.  AAA :  will need OP follow up      I have spent a total of 40 minutes with patient reviewing hospital  notes , telemetry, EKGs, labs and examining patient as well as establishing an assessment and plan that was discussed with the patient.  > 50% of time was spent in direct patient care.    Thayer Headings, Brooke Bonito., MD, [facility] 11/25/2020, 11:23 AM [address],  [place] Pager [phone]

## 2020-11-25 NOTE — Progress Notes (Signed)
Dr. Alvie Heidelberg placed new orders on pt to continue to monitor pt and do a CIWA score on pt.

## 2020-11-25 NOTE — Progress Notes (Signed)
Family Medicine Teaching Service Daily Progress Note Intern Pager: [phone]  Patient name: Kevin [name] Medical record number: [id number] Date of birth: 04-12-1940 Age: 80 y.o. Gender: male  Primary Care Provider: Daphene Jaeger, PA-C Consultants: Cardiology, pulmonology (signed off), vascular surgery (signed off) Code Status: Full code  Pt Overview and Major Events to Date:  9/11 admitted, thoracentesis performed 9/12 echo with EF of 20 to 25% with moderate mitral regurgitation presumed nonischemic  Assessment and Plan:  80 year old man presenting with shortness of breath and hypoxia.  PE past medical history significant for hypertension, HFrEF, AAA, chronic subdural hematoma, CKD 3B, wide-complex tachycardia, BPH, history of DVT, hyperlipidemia, moderate mitral regurgitation  Hypoxic respiratory failure in the setting of HFrEF EF of 20 to 25% and MR on echo  COVID-19 exacerbation He feels his breathing is much better. Weight is 205.69 from 202.16 yesterday up 3 pounds.  Urine output is 2.1 liters. No peripheral edema on exam. Troponins had trended flat.   -Cardiology is following yesterday and recommended continuing Lasix 40 mg BID, and may need right and left cath once stabilized.  MR seem functional and not severe likely exacerbated due to COVID and not candidate for MV clip. Keep K>4, Mag>2 -Was on room air last night currently on 2 L nasal cannula saturating at 100%, wean as tolerated -O2 sats 88% and greater -Lasix continue per cardiology even with creatinine bump -strict I's and O's -albuterol inhaler -incruse ellipta inhaler daily -duoneb prn  Pleural effusions s/p thoracentesis Todays patient's breathing has been much better. Transudative, likely related to his heart failure. No growth in 2 days of pleural fluid, few wbc present on gram stain. -pulmonology outpatient f/u -supplemental oxygen  Covid 19 Cough has been same from yesterday and has remained  afebrile. -remdesivir day 3 -decadron 6 mg oral daily x 10 days. See how he does today, if worsening delirium can consider d/cing -wean o2 as tolerated -incentive spirometry -prn zofran -azithro, ceftriaxone 5 days  Delirium Patient this morning said he was seeing and hearing things last night which resolved this AM. Patient was laughing during exam. Oriented to person and situation, but thought it was 2002 or 2003, [facility] [place]. Likely in hospital delirium. Last valium 60 pills in may, unlikely to be withdrawal symptoms. -delirium precautions -CIWA 17 at midnight, 1 this morning. Will d/c this order  Wide complex tachycardia Patient has not had any chest pain but says he sometimes has palpitations. Heart rates are 65-80.  -Cardiology recommended continuing home coreg 6.25 bid -cardiac monitoring  AKI on CKD Creatinine today 2.24 from 2.13. Does have renal mass on abd/pelvis CT. UOP 2.1 L.  -continue lasix per cardiology BID  AAA with IVC Filter Does not require therapeutic anticoagulation per vascular continue lovenox 30 mg subq. -outpatient vascular for endovascular repair once stabilized from acute infection  Elevated D-Dimer 6.66 on admission. CTA negative, DVT US negative. Chronic phantom leg pain on left AKA site. -oxycodone-acetaminophen 5-325 bid prn   HTN Bps have been mostly normotensive -continue home coreg  BPH external catheter in place -consider urinal today -home flomax   HLD -home atorvastatin  FEN/GI: Heart PPx: Lovenox 30 mg Dispo:SNF pending clinical improvement . Barriers include continued medical workup.   Subjective:  Patient had no specific complaints today. Talked about his delirium last night and was seeing and hearing things. Denies any hallucinations currently and laughs about the encounter. No shortness of breath or chest  pain but sometimes has palpitations.  Objective: Temp:  [97.8 F (36.6  C)-99 F (37.2 C)] 97.8 F (36.6 C) (09/12 2350) Pulse Rate:  [68-80] 68 (09/12 2350) Resp:  [18-21] 18 (09/12 2350) BP: (108-124)/(84-90) 124/88 (09/12 2350) SpO2:  [95 %-100 %] 100 % (09/12 2350) Weight:  [93.3 kg] 93.3 kg (09/13 0600) Physical Exam: General: NAD Neuro: A&O x 2, no focal deficits Cardiovascular: RRR, radial pulses intact bilaterally Respiratory: no iWOB, chest rises symmetrically Abdomen: Nontender to palpation, soft, nondistended Extremities: Moves freely, no peripheral edema on examination  Laboratory: Recent Labs  Lab 12/06/2020 1644 11/23/20 0431 11/24/20 0218  WBC 4.4 3.9* 6.8  HGB 14.9 14.1 13.3  HCT 49.6 46.1 41.9  PLT 181 186 163   Recent Labs  Lab 11/17/2020 1644 11/23/20 0431 11/23/20 1744 11/24/20 0218 11/25/20 0259  NA 138 135  --  136 134*  K 3.6 4.2  --  4.1 4.4  CL 103 104  --  104 102  CO2 24 23  --  25 22  BUN 24* 26*  --  41* 50*  CREATININE 1.85* 1.82*  --  2.13* 2.24*  CALCIUM 8.2* 8.0*  --  7.9* 7.9*  PROT 6.3*  --  5.9*  --   --   BILITOT 1.1  --   --   --   --   ALKPHOS 74  --   --   --   --   ALT 11  --   --   --   --   AST 15  --   --   --   --   GLUCOSE 112* 163*  --  149* 115*    Imaging/Diagnostic Tests: No results found.   Gerrit Heck, MD 11/25/2020, 6:29 AM PGY-1, [place] Intern pager: [phone], text pages welcome

## 2020-11-25 NOTE — Progress Notes (Signed)
FPTS Interim Progress Note  S: Called to assess patient. Per nurse report, patient anxious and tearful, saying that people are trying to kill him, reportedly seeing people in the room and hearing voices. Nurse points out that patient has Valium 5 mg BID on home med list.   PDMP reviewed, showing several scripts for Valium, however, last filled in May of this year. CIWA ordered. Per nurse, score of 17, scoring for anxiety (6), auditory and tactile disturbance (5+5), and disorientation (1). Patient denies recent Hx of alcohol use, saying his last drink was 10 years ago. The nurse had family call the patient and this apparently calmed him greatly.   On my assessment, the patient's appears more confused than earlier, believing that some people are coming to do gardening for him. He is alert oriented to the degree he was before (oriented to person, location, and month, and events but not year). He is able to follow simple commands such as squeezing my fingers, but is unable to follow commands that require a longer attention span (SAVEAHEART testing). His CIWA at my time of assessment would be much lower than 17.   O: BP 124/88 (BP Location: Left Arm)   Pulse 68   Temp 97.8 F (36.6 C) (Oral)   Resp 18   Ht '5\' 5"'$  (1.651 m)   Wt 91.7 kg   SpO2 100%   BMI 33.64 kg/m   VSS  A/P: This patient is likely experiencing delirium given attentional deficit, new onset hallucinations, and significant improvement with re-orientation from family members. Other Ddx include benzo/EtOH w/d, steroid induced psychosis.  -Delirium precautions ordered -Nursing asked to turn off TV and lights when possible    Corky Sox PGY-1, Psychiatry Service pager [phone]

## 2020-11-25 NOTE — TOC Progression Note (Addendum)
Transition of Care [facility]) - Progression Note    Patient Details  Name: Kevin [name] MRN: [id number] Date of Birth: 1940/03/27  Transition of Care [facility]) CM/SW [place], [place] Phone Number: 11/25/2020, 11:17 AM  Clinical Narrative:     CSW called patients son [name] and was unable to leave voicemail. CSW will try back this afternoon to provide patients son with SNF bed offers. SNF offers confirmed they can accept patient for SNF when out of precautions.CSW will continue to follow and assist with dc planning needs.  Expected Discharge Plan: [place] Barriers to Discharge: Continued Medical Work up  Expected Discharge Plan and Services Expected Discharge Plan: [place] In-house Referral: Clinical Social Work                                             Social Determinants of Health (SDOH) Interventions    Readmission Risk Interventions No flowsheet data found.

## 2020-11-25 NOTE — Progress Notes (Signed)
Was told by night team to update son [name] today. Called and left return office phone number on his voicemail.

## 2020-11-25 NOTE — TOC Benefit Eligibility Note (Signed)
Patient Teacher, English as a foreign language completed.    The patient is currently admitted and upon discharge could be taking Jardiance 10 mg.  The current 30 day co-pay is, $4.00.   The patient is insured through [place], [place] Patient Advocate Specialist [place] Team Direct Number: [phone]  Fax: [phone]

## 2020-11-25 NOTE — Progress Notes (Signed)
FPTS Interim Progress Note  S: Patient reports stable shortness of breath on RA. Able to speak in full sentences. Fully alert and oriented at this time.  Unable to follow along for CAM testing.  Does not appear delirious like last night. Urine output is 0.8 mL/kg/h so far.  O: BP (!) 128/95   Pulse 60   Temp 98.2 F (36.8 C)   Resp 14   Ht '5\' 5"'$  (1.651 m)   Wt 205 lb 11 oz (93.3 kg)   SpO2 93%   BMI 34.23 kg/m   VSS General: sick appearing male sitting up in bed CV: RRR, poor peripheral pulses, cold hands Resp: slightly increased work of breathing, diffuse wheezes and rhonchi, worse than yesterday.  A/P: Overall patient is more ill appearing than yesterday, but is in stable condition. -No changes in plan per night team -Remainder of plan per day team   Corky Sox PGY-1, Psychiatry Service pager [phone]

## 2020-11-26 DIAGNOSIS — I5033 Acute on chronic diastolic (congestive) heart failure: Secondary | ICD-10-CM

## 2020-11-26 DIAGNOSIS — I5023 Acute on chronic systolic (congestive) heart failure: Secondary | ICD-10-CM | POA: Diagnosis not present

## 2020-11-26 LAB — BASIC METABOLIC PANEL
Anion gap: 14 (ref 5–15)
BUN: 57 mg/dL — ABNORMAL HIGH (ref 8–23)
CO2: 25 mmol/L (ref 22–32)
Calcium: 8 mg/dL — ABNORMAL LOW (ref 8.9–10.3)
Chloride: 94 mmol/L — ABNORMAL LOW (ref 98–111)
Creatinine, Ser: 2.44 mg/dL — ABNORMAL HIGH (ref 0.61–1.24)
GFR, Estimated: 26 mL/min — ABNORMAL LOW (ref >60)
Glucose, Bld: 120 mg/dL — ABNORMAL HIGH (ref 70–99)
Potassium: 4.3 mmol/L (ref 3.5–5.1)
Sodium: 133 mmol/L — ABNORMAL LOW (ref 135–145)

## 2020-11-26 LAB — MAGNESIUM: Magnesium: 2.2 mg/dL (ref 1.7–2.4)

## 2020-11-26 LAB — BODY FLUID CULTURE W GRAM STAIN: Culture: NO GROWTH

## 2020-11-26 LAB — CYTOLOGY - NON PAP

## 2020-11-26 MED ORDER — POLYETHYLENE GLYCOL 3350 17 G PO PACK
17.0000 g | PACK | Freq: Every day | ORAL | Status: DC
  Administered 2020-11-26 – 2020-12-03 (×6): 17 g via ORAL
  Filled 2020-11-26 (×6): qty 1

## 2020-11-26 MED ORDER — FAMOTIDINE 20 MG PO TABS
10.0000 mg | ORAL_TABLET | Freq: Every day | ORAL | Status: DC
  Administered 2020-11-26 – 2020-12-03 (×8): 10 mg via ORAL
  Filled 2020-11-26 (×8): qty 1

## 2020-11-26 NOTE — Progress Notes (Signed)
Progress Note  Patient Name: Kevin [name] Date of Encounter: 11/26/2020  [facility] Cardiologist: None   Subjective   No significant change in shortness of breath.  No further delirium.  Inpatient Medications    Scheduled Meds:  albuterol  2 puff Inhalation BID   atorvastatin  40 mg Oral Daily   azithromycin  500 mg Oral Daily   carvedilol  6.25 mg Oral BID WC   dexamethasone  6 mg Oral Daily   enoxaparin (LOVENOX) injection  30 mg Subcutaneous Daily   furosemide  40 mg Intravenous BID   tamsulosin  0.4 mg Oral QPC supper   umeclidinium bromide  1 puff Inhalation Daily   Continuous Infusions:  cefTRIAXone (ROCEPHIN)  IV 2 g (11/26/20 0935)   remdesivir 100 mg in NS 100 mL 100 mg (11/26/20 0934)   PRN Meds: acetaminophen, ipratropium-albuterol, ondansetron (ZOFRAN) IV, oxyCODONE-acetaminophen   Vital Signs    Vitals:   11/26/20 0502 11/26/20 0509 11/26/20 0906 11/26/20 0924  BP:  (!) 118/93 (!) 121/91   Pulse:  62 68   Resp:  (!) 24 20   Temp:  98.2 F (36.8 C) 98.2 F (36.8 C)   TempSrc:  Oral Oral   SpO2: 100% 100% 98% 99%  Weight:   89.6 kg   Height:        Intake/Output Summary (Last 24 hours) at 11/26/2020 0959 Last data filed at 11/26/2020 0900 Gross per 24 hour  Intake 560 ml  Output 2850 ml  Net -2290 ml   Last 3 Weights 11/26/2020 11/25/2020 11/24/2020  Weight (lbs) 197 lb 8.5 oz 205 lb 11 oz 202 lb 2.6 oz  Weight (kg) 89.6 kg 93.3 kg 91.7 kg      Telemetry    Normal sinus rhythm- Personally Reviewed  ECG    No new- Personally Reviewed  Physical Exam   GEN: No acute distress.  Fairly ill-appearing. Neck: No JVD Cardiac: RRR, no murmurs, rubs, or gallops.  Respiratory: Mildly increased work of breathing.  Diffuse rhonchi. GI: Soft, nontender, non-distended  MS: No edema; No deformity. Neuro:  Nonfocal  Psych: Normal affect   Labs    High Sensitivity Troponin:   Recent Labs  Lab 11/26/2020 1821 11/23/20 0134 11/23/20 0431  11/23/20 1218 11/23/20 1744  TROPONINIHS 94* 91* 132* 112* 118*      Chemistry Recent Labs  Lab 12/07/2020 1644 11/23/20 0431 11/23/20 1744 11/24/20 0218 11/25/20 0259 11/26/20 0301  NA 138   < >  --  136 134* 133*  K 3.6   < >  --  4.1 4.4 4.3  CL 103   < >  --  104 102 94*  CO2 24   < >  --  '25 22 25  '$ GLUCOSE 112*   < >  --  149* 115* 120*  BUN 24*   < >  --  41* 50* 57*  CREATININE 1.85*   < >  --  2.13* 2.24* 2.44*  CALCIUM 8.2*   < >  --  7.9* 7.9* 8.0*  PROT 6.3*  --  5.9*  --   --   --   ALBUMIN 2.4*  --   --   --   --   --   AST 15  --   --   --   --   --   ALT 11  --   --   --   --   --   ALKPHOS 74  --   --   --   --   --  BILITOT 1.1  --   --   --   --   --   GFRNONAA 36*   < >  --  31* 29* 26*  ANIONGAP 11   < >  --  '7 10 14   '$ < > = values in this interval not displayed.     Hematology Recent Labs  Lab 12/04/2020 1644 11/23/20 0431 11/24/20 0218  WBC 4.4 3.9* 6.8  RBC 5.75 5.46 5.03  HGB 14.9 14.1 13.3  HCT 49.6 46.1 41.9  MCV 86.3 84.4 83.3  MCH 25.9* 25.8* 26.4  MCHC 30.0 30.6 31.7  RDW 17.3* 17.2* 16.8*  PLT 181 186 163    BNP Recent Labs  Lab 11/14/2020 1644  BNP 1,386.0*     DDimer  Recent Labs  Lab 11/24/2020 1735  DDIMER 6.66*     Radiology    No results found.    Patient Profile     80 y.o. male here with COVID-pneumonia, mitral regurgitation, AAA 6.2 cm, left AKA, history of PE DVT chronic kidney disease stage IIIb with acute on chronic kidney injury.  Assessment & Plan    Acute hypoxic respiratory failure - COVID-19.  Bilateral pleural effusions on x-ray.  No PE on CT of chest.  Did have occlusion of right bronchus.  Diuresis recommended by pulmonary as well as antibiotics for suspected bacterial coinfection.  Thoracentesis was provided 1.4 L of fluid removed from right pleural space. - On steroids and remdesivir per primary team - Antibiotics per primary team - Repeat chest CT in 1 month per recommendations of  pulmonary.  Acute on chronic combined systolic and diastolic heart failure - Dates back to 2007 with EF of 20%.  EF is unchanged.  Weight has increased. - Continue with low-dose carvedilol. - Challenging to utilize ARN I given worsening creatinine. - Agree with IV Lasix administration.  May need to hold tomorrow if creatinine continues to rise.  Elevated troponin - Known coronary disease moderate nonobstructive on catheterization several years ago in 2007.  Troponin was mildly elevated 132 which is demand ischemia in the setting of respiratory failure and COVID-pneumonia.  Although this is not acute coronary syndrome, this does worsen overall prognosis. -No further ischemic work-up at this time.  Once he is healed and feeling better, we can consider further evaluation with pharmacologic stress test as an outpatient.  Mitral regurgitation - Appears moderate to severe on this echocardiogram.  Would not recommend a TEE at this time for further evaluation.  Continue with diuresis.  Volume management.  At some point, further evaluation is required.  AAA - 6.2 cm.  Vascular surgery will see as outpatient.  Potential EVAR consideration.  35 minutes spent with data review patient care.   For questions or updates, please contact [place] Please consult [email or web address] for contact info under        Signed, Candee Furbish, MD  11/26/2020, 9:59 AM

## 2020-11-26 NOTE — Progress Notes (Signed)
Family Medicine Teaching Service Daily Progress Note Intern Pager: [phone]  Patient name: Kevin [name] Medical record number: [id number] Date of birth: 05-04-1940 Age: 80 y.o. Gender: male  Primary Care Provider: Daphene Jaeger, PA-C Consultants: Cardiology, pulmonology signed off, vascular surgery signed off Code Status: Full code  Pt Overview and Major Events to Date:  9/11 admitted, thoracentesis performed 9/12 echo with EF of 20 to 25% with moderate mitral regurgitation presumed nonischemic  Assessment and Plan:  80 year old man presenting with shortness of breath and hypoxia.  Past medical history is significant for hypertension, HFrEF, AAA, chronic subdural hematoma, CKD 3B, wide-complex tachycardia, BPH, history of DVT, hyperlipidemia, moderate mitral regurgitation  Hypoxic respiratory failure in the setting of HFrEF EF 20 to 25% and MR on echo  COVID-19 exacerbation His breathing today was similar to yesterday.  Weight is 197.53 from 205.69 yesterday, down 8 pounds.  Urine output is 2.85 L.  No peripheral edema on exam.  No crackles that I heard on lung exam.  Potassium was 4.3 today and magnesium was 2.2.  -Cardiology is following, Lasix per cardiology recs and may need right and left cath once stabilized.  Keep K greater than 4 and mag greater than 2 -Was put back on 2 L this morning; I took him off oxygen while in room and he was sating high 90s to 100s with no iWOB -O2 sats greater than 88% -Lasix twice daily per cardiology -Strict I's and O's -Daily weights -Albuterol inhaler -Ellipta inhaler -DuoNeb as needed -outpatient management for MR  Pleural effusions s/p thoracentesis Today patient's breathing is not labored.  No growth to date pleural fluid. -Pulmonology outpatient follow-up -Supplemental oxygen as needed  COVID-19 Cough has been similar to before, he has remained afebrile. -Remdesivir day 4 -Decadron 6 mg oral daily x10 days; can consider stopping  if delirium is worsening -Wean oxygen as tolerated -Incentive spirometry -As needed Zofran -Azithromycin, ceftriaxone for 5 days (ending tomorrow 9/15) for any bacterial component  Constipation/Acid Reflux -Added miralax and pepcid  Delirium Patient did not have any calls overnight for delirium.  He was oriented toperson, place, situation, and said it was 2002. Overall improved mental status, no laughing on exam, no calls overnight. -Delirium precautions  Wide-complex tachycardia Patient sometimes has palpitations but no chest pain -Cardiology continue home Coreg 6.25 twice daily -Cardiac monitoring  AKI on CKD Worsening today. Creatinine today is 2.44 from 2.24.  GFR 26 from 29. Does have renal mass on abdominal/pelvis CT.  Urine output 2.85. -Continue Lasix per cardiology  AAA with IVC filter Does not require therapeutic anticoagulation per vascular continue Lovenox 30 mg subcu -Outpatient vascular for endovascular repair when stabilized from acute infection  Elevated D-dimer 6.66 on admission, CTA negative, DVT ultrasound negative.  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 -Home Flomax  FEN/GI: Heart PPx: Lovenox 30 mg Dispo: SNF pending clinical improvement barriers include continue medical work-up.    Subjective:  Says he hasn't had BM in 1-2 days and feels constipated. He says sometimes he feels some burning in abdomen   Objective: Temp:  [97.7 F (36.5 C)-98.2 F (36.8 C)] 98.2 F (36.8 C) (09/14 0509) Pulse Rate:  [60-65] 62 (09/14 0509) Resp:  [14-25] 24 (09/14 0509) BP: (113-143)/(73-118) 118/93 (09/14 0509) SpO2:  [93 %-100 %] 100 % (09/14 0509)  Physical Exam: General: NAD Cardiovascular: RRR no m/r/g, radial pulses intact Respiratory: no iWOB, chest rises  symmetrically, saturating well on RA, no crackles auscultated, some course breath sounds bilaterally Abdomen: Nontender to palpation, non-distended Extremities: No pitting edema in LE, chronic venous stasis changes   Laboratory: Recent Labs  Lab 12/01/2020 1644 11/23/20 0431 11/24/20 0218  WBC 4.4 3.9* 6.8  HGB 14.9 14.1 13.3  HCT 49.6 46.1 41.9  PLT 181 186 163   Recent Labs  Lab 11/24/2020 1644 11/23/20 0431 11/23/20 1744 11/24/20 0218 11/25/20 0259 11/26/20 0301  NA 138   < >  --  136 134* 133*  K 3.6   < >  --  4.1 4.4 4.3  CL 103   < >  --  104 102 94*  CO2 24   < >  --  '25 22 25  '$ BUN 24*   < >  --  41* 50* 57*  CREATININE 1.85*   < >  --  2.13* 2.24* 2.44*  CALCIUM 8.2*   < >  --  7.9* 7.9* 8.0*  PROT 6.3*  --  5.9*  --   --   --   BILITOT 1.1  --   --   --   --   --   ALKPHOS 74  --   --   --   --   --   ALT 11  --   --   --   --   --   AST 15  --   --   --   --   --   GLUCOSE 112*   < >  --  149* 115* 120*   < > = values in this interval not displayed.    Imaging/Diagnostic Tests:  Gerrit Heck, MD 11/26/2020, 7:41 AM PGY-1, [place] Intern pager: [phone], text pages welcome

## 2020-11-26 NOTE — TOC Progression Note (Signed)
Transition of Care [facility]) - Progression Note    Patient Details  Name: Kevin [name] MRN: [id number] Date of Birth: [birth year]  [facility] CM/SW [place], [place] Phone Number: 11/26/2020, 4:03 PM  Clinical Narrative:     CSW tried to called patients son Kevin [name] regarding SNF bed offers. CSW left voicemail. CSW awaiting callback. CSW will continue to follow and assist with dc planning needs.  Expected Discharge Plan: [place] Barriers to Discharge: Continued Medical Work up  Expected Discharge Plan and Services Expected Discharge Plan: [place] In-house Referral: Clinical Social Work                                             Social Determinants of Health (SDOH) Interventions    Readmission Risk Interventions No flowsheet data found.

## 2020-11-26 NOTE — Progress Notes (Signed)
Occupational Therapy Treatment Patient Details Name: Kevin [name] MRN: [id number] DOB: Jun 29, 1940 Today's Date: 11/26/2020   History of present illness Pt is an 80 y/o male who presented to ED with SOB and hypoxia. Pt found to be COVID+ and with CHF exacerbation. PMH: HTN, HFmrEF, AAA, L AKA (2013), chronic subdural hematoma, CKD3B, wide complex tachycardia, BPH, hx of DVT, HLD, moderate mitral regurgitation.   OT comments  Pt with gradual progression towards OT goals. Pt noted with spilled food and drink after eating lunch, so session focused on sitting balance EOB while completing UB ADLs. Pt requires up to Mod A for UB ADLs due to difficulty maintaining balance without B UE support. Pt unable to successfully scoot along EOB but was able to demo ability to pull self up via bedrails. Pt also with continued intermittent confusion and difficulty sequencing though remains pleasant and participatory. VSS on RA. Continue to rec SNF rehab    Recommendations for follow up therapy are one component of a multi-disciplinary discharge planning process, led by the attending physician.  Recommendations may be updated based on patient status, additional functional criteria and insurance authorization.    Follow Up Recommendations  SNF;Supervision/Assistance - 24 hour    Equipment Recommendations  Wheelchair (measurements OT);Wheelchair cushion (measurements OT);Hospital bed (pt reports his wheelchair & cushion are worn out)    Recommendations for Other Services      Precautions / Restrictions Precautions Precautions: Fall;Other (comment) Precaution Comments: COVID+, L AKA (does not have prosthetic in room) Restrictions Weight Bearing Restrictions: No       Mobility Bed Mobility Overal bed mobility: Needs Assistance Bed Mobility: Supine to Sit;Sit to Supine;Rolling Rolling: Max assist   Supine to sit: Max assist;HOB elevated Sit to supine: Mod assist   General bed mobility comments: Max  A to sit EOB, multimodal cues to sequence task. Assist to scoot hips forward and heavy assist to lift trunk. Mod A to return to supine with pt able to control trunk, assist needed for LEs and scooting hips back. Pt able to roll to R side with Mod A and Max A to roll to L side for changing bed pad due to food crumbs    Transfers                 General transfer comment: unable to attempt, will need 2nd person or lift equipment, attempted lateral scooting however pt unable to lift bottom    Balance Overall balance assessment: Needs assistance Sitting-balance support: Feet supported;Bilateral upper extremity supported Sitting balance-Leahy Scale: Poor Sitting balance - Comments: reliant on at least one UE support EOB and Min A or BUE support and min guard for tasks                                   ADL either performed or assessed with clinical judgement   ADL Overall ADL's : Needs assistance/impaired     Grooming: Set up;Bed level;Wash/dry face Grooming Details (indicate cue type and reason): washing face to remove lunch residue Upper Body Bathing: Moderate assistance;Sitting Upper Body Bathing Details (indicate cue type and reason): Mod A overall for UB bathing sitting EOB. Assist needed to maintain balance (B UE support or min guard to Min A for one UE support while using other UE to assist with task). Assist to wash back and armpits due to impaired sitting balance     Upper Body Dressing : Minimal  assistance;Sitting Upper Body Dressing Details (indicate cue type and reason): Min A overall to don clean hospital gown. pt able to use one UE support on bed while threading other UE through sleeve                   General ADL Comments: Session focused on sitting balance during UB ADLs. Pt reliant on at least one UE support to maintain balance     Vision   Vision Assessment?: No apparent visual deficits   Perception     Praxis      Cognition  Arousal/Alertness: Awake/alert Behavior During Therapy: WFL for tasks assessed/performed Overall Cognitive Status: No family/caregiver present to determine baseline cognitive functioning                                 General Comments: pt with delayed response time and difficulty sequencing tasks. Slow motor planning and some difficulty following directions though pleasant and cooperative        Exercises     Shoulder Instructions       General Comments VSS on RA    Pertinent Vitals/ Pain       Pain Assessment: Faces Faces Pain Scale: No hurt Pain Intervention(s): Monitored during session  Home Living                                          Prior Functioning/Environment              Frequency  Min 2X/week        Progress Toward Goals  OT Goals(current goals can now be found in the care plan section)  Progress towards OT goals: Progressing toward goals  Acute Rehab OT Goals Patient Stated Goal: go to rehab OT Goal Formulation: With patient Time For Goal Achievement: 12/08/20 Potential to Achieve Goals: Good ADL Goals Pt Will Perform Grooming: with modified independence;sitting Pt Will Perform Lower Body Bathing: with min assist;sitting/lateral leans Pt Will Transfer to Toilet: with mod assist;squat pivot transfer;bedside commode Pt Will Perform Toileting - Clothing Manipulation and hygiene: with min assist;sitting/lateral leans  Plan Discharge plan remains appropriate    Co-evaluation                 AM-PAC OT "6 Clicks" Daily Activity     Outcome Measure   Help from another person eating meals?: A Little Help from another person taking care of personal grooming?: A Little Help from another person toileting, which includes using toliet, bedpan, or urinal?: Total Help from another person bathing (including washing, rinsing, drying)?: A Lot Help from another person to put on and taking off regular upper body  clothing?: A Little Help from another person to put on and taking off regular lower body clothing?: Total 6 Click Score: 13    End of Session    OT Visit Diagnosis: Unsteadiness on feet (R26.81);Other abnormalities of gait and mobility (R26.89);Muscle weakness (generalized) (M62.81)   Activity Tolerance Patient tolerated treatment well   Patient Left in bed;with call bell/phone within reach;with bed alarm set   Nurse Communication Mobility status        Time: 1336-1400 OT Time Calculation (min): 24 min  Charges: OT General Charges $OT Visit: 1 Visit OT Treatments $Self Care/Home Management : 8-22 mins $Therapeutic Activity: 8-22 mins  Almyra Free B, OTR/L  Acute Rehab Services Office: [phone]   Layla Maw 11/26/2020, 2:24 PM

## 2020-11-27 ENCOUNTER — Other Ambulatory Visit (HOSPITAL_COMMUNITY): Payer: Self-pay

## 2020-11-27 DIAGNOSIS — I5043 Acute on chronic combined systolic (congestive) and diastolic (congestive) heart failure: Secondary | ICD-10-CM | POA: Diagnosis not present

## 2020-11-27 DIAGNOSIS — J9601 Acute respiratory failure with hypoxia: Secondary | ICD-10-CM | POA: Diagnosis not present

## 2020-11-27 DIAGNOSIS — U071 COVID-19: Secondary | ICD-10-CM | POA: Diagnosis not present

## 2020-11-27 LAB — BASIC METABOLIC PANEL
Anion gap: 8 (ref 5–15)
BUN: 64 mg/dL — ABNORMAL HIGH (ref 8–23)
CO2: 29 mmol/L (ref 22–32)
Calcium: 7.7 mg/dL — ABNORMAL LOW (ref 8.9–10.3)
Chloride: 96 mmol/L — ABNORMAL LOW (ref 98–111)
Creatinine, Ser: 2.33 mg/dL — ABNORMAL HIGH (ref 0.61–1.24)
GFR, Estimated: 28 mL/min — ABNORMAL LOW (ref >60)
Glucose, Bld: 126 mg/dL — ABNORMAL HIGH (ref 70–99)
Potassium: 4.5 mmol/L (ref 3.5–5.1)
Sodium: 133 mmol/L — ABNORMAL LOW (ref 135–145)

## 2020-11-27 LAB — ALBUMIN: Albumin: 2.4 g/dL — ABNORMAL LOW (ref 3.5–5.0)

## 2020-11-27 NOTE — Progress Notes (Signed)
Heart Failure Navigator Progress Note  Assessed for Heart & Vascular TOC clinic readiness.  Patient does not meet criteria due to plan for SNF placement. Pt is very physically weak, would not benefit from HV TOC upon DC, if strength improves this admission could be potential candidate after isolation restrictions per clinic policy.   Navigator available for reassessment of patient.   Pricilla Holm, MSN, RN Heart Failure Nurse Navigator [phone]

## 2020-11-27 NOTE — Progress Notes (Signed)
Went to see patient start shift.  Patient resting comfortably in bed with no complaints.  Regular rate and rhythm, some fine crackles in bilateral bases but no respiratory distress on room air.  No concerns or complaints at this time.  Remainder per daytime progress note.

## 2020-11-27 NOTE — Progress Notes (Addendum)
Progress Note  Patient Name: Kevin [name] Date of Encounter: 11/27/2020  [facility] Cardiologist: None   Subjective   Breathing ok, says normal wt about 200 lbs. Gets palpitations at times, has had dizziness associated w/ these No chest pain  Inpatient Medications    Scheduled Meds:  albuterol  2 puff Inhalation BID   atorvastatin  40 mg Oral Daily   azithromycin  500 mg Oral Daily   carvedilol  6.25 mg Oral BID WC   dexamethasone  6 mg Oral Daily   enoxaparin (LOVENOX) injection  30 mg Subcutaneous Daily   famotidine  10 mg Oral Daily   furosemide  40 mg Intravenous BID   polyethylene glycol  17 g Oral Daily   tamsulosin  0.4 mg Oral QPC supper   umeclidinium bromide  1 puff Inhalation Daily   Continuous Infusions:  cefTRIAXone (ROCEPHIN)  IV 2 g (11/26/20 0935)   remdesivir 100 mg in NS 100 mL 100 mg (11/26/20 0934)   PRN Meds: acetaminophen, ipratropium-albuterol, ondansetron (ZOFRAN) IV, oxyCODONE-acetaminophen   Vital Signs    Vitals:   11/27/20 0418 11/27/20 0507 11/27/20 0808 11/27/20 0817  BP:   (!) 115/95 (!) 115/95  Pulse:   71 72  Resp: '16  18 20  '$ Temp:   98.1 F (36.7 C)   TempSrc:   Axillary   SpO2: 100%  94% (!) 88%  Weight:  87.3 kg    Height:        Intake/Output Summary (Last 24 hours) at 11/27/2020 0827 Last data filed at 11/27/2020 0600 Gross per 24 hour  Intake 2000 ml  Output 3000 ml  Net -1000 ml   Last 3 Weights 11/27/2020 11/26/2020 11/25/2020  Weight (lbs) 192 lb 7.4 oz 197 lb 8.5 oz 205 lb 11 oz  Weight (kg) 87.3 kg 89.6 kg 93.3 kg      Telemetry    SR, PVCs and short runs NSVT, 1 Longer run- Personally Reviewed  ECG    No new- Personally Reviewed  Physical Exam   GEN: No acute distress.   Neck: JVD 8-9 cm Cardiac: RRR, no murmur, no rubs, or gallops.  Respiratory: diminished to auscultation bilaterally with rales in the bases. GI: Soft, nontender, non-distended  MS: No edema; No deformity. Neuro:  Nonfocal   Psych: Normal affect   Labs    High Sensitivity Troponin:   Recent Labs  Lab 11/25/2020 1821 11/23/20 0134 11/23/20 0431 11/23/20 1218 11/23/20 1744  TROPONINIHS 94* 91* 132* 112* 118*      Chemistry Recent Labs  Lab 12/04/2020 1644 11/23/20 0431 11/23/20 1744 11/24/20 0218 11/25/20 0259 11/26/20 0301 11/27/20 0311  NA 138   < >  --    < > 134* 133* 133*  K 3.6   < >  --    < > 4.4 4.3 4.5  CL 103   < >  --    < > 102 94* 96*  CO2 24   < >  --    < > '22 25 29  '$ GLUCOSE 112*   < >  --    < > 115* 120* 126*  BUN 24*   < >  --    < > 50* 57* 64*  CREATININE 1.85*   < >  --    < > 2.24* 2.44* 2.33*  CALCIUM 8.2*   < >  --    < > 7.9* 8.0* 7.7*  PROT 6.3*  --  5.9*  --   --   --   --  ALBUMIN 2.4*  --   --   --   --   --   --   AST 15  --   --   --   --   --   --   ALT 11  --   --   --   --   --   --   ALKPHOS 74  --   --   --   --   --   --   BILITOT 1.1  --   --   --   --   --   --   GFRNONAA 36*   < >  --    < > 29* 26* 28*  ANIONGAP 11   < >  --    < > '10 14 8   '$ < > = values in this interval not displayed.     Hematology Recent Labs  Lab 11/23/2020 1644 11/23/20 0431 11/24/20 0218  WBC 4.4 3.9* 6.8  RBC 5.75 5.46 5.03  HGB 14.9 14.1 13.3  HCT 49.6 46.1 41.9  MCV 86.3 84.4 83.3  MCH 25.9* 25.8* 26.4  MCHC 30.0 30.6 31.7  RDW 17.3* 17.2* 16.8*  PLT 181 186 163    BNP Recent Labs  Lab 11/20/2020 1644  BNP 1,386.0*     DDimer  Recent Labs  Lab 12/12/2020 1735  DDIMER 6.66*     Radiology    No results found.  Patient Profile     80 y.o. male here with COVID-pneumonia, mitral regurgitation, AAA 6.2 cm, left AKA, history of PE DVT chronic kidney disease stage IIIb with acute on chronic kidney injury.  Assessment & Plan    Acute hypoxic respiratory failure - 2nd COVID, no PE, occlusion R bronchus, bilat pleural effusions  - s/p R thoracentesis w/ 1.4 L out - had Lasix at first, will d/c today - steroids and remdesivir, ABX per IM - f/u chest CT  in 1 month  Acute on chronic combined systolic and diastolic heart failure - EF 20% in 2007, NICM w/ mod but non-obs dz at cath then - EF up to 50-55% 2013, but back to 20% 2015 - on low-dose BB - Wt 192 lbs is below previous wts, likely dry wt - will d/c Lasix IV, discuss po Lasix w/ MD - Cr 1.82 on admit, peak 2.44, now 2.33 - says has scales but was not weighing himself  Elevated troponin - last cath 2007 - feel mild trop elevation in setting of COVID PNA more c/w demand ischemia than ACS -F/U as outpt and discuss ischemic eval at that time  Mitral regurgitation - see echo, mod-severe - however that was in setting of volume overload - discuss w/ MD rechecking echo as outpt  AAA - 6.2 cm on 08/23/2020 CT, similar size and partly thrombosed on CT this admit - consider Vasc Surgery eval as outpt  NSVT - seen on telemetry - longer runs may be symptomatic - once recovers from [place], may need EP eval - K+ 4.5, Mg 2.2, so electrolytes not playing a role  32 minutes spent with data review patient care.   For questions or updates, please contact [place] Please consult [email or web address] for contact info under        Signed, Rosaria Ferries, PA-C  11/27/2020, 8:27 AM    History and all data above reviewed.  Patient examined.  Confused and did not sleep last night.  No pain.  I agree with the findings as above.  The patient exam reveals COR:RRR  ,  Lungs: Clear  ,  Abd: Positive bowel sounds, no rebound no guarding, Ext No edema  .  All available labs, radiology testing, previous records reviewed. Agree with documented assessment and plan. Acute on chronic systolic HF:  He does not appear to be volume overloaded and I will not change add diuretic today given his continued renal insufficiency.    Garette Jewett Mcgann  12:36 PM  11/27/2020

## 2020-11-27 NOTE — Progress Notes (Signed)
Physical Therapy Treatment Patient Details Name: Kevin [name] MRN: [id number] DOB: [birth year] Today's Date: 11/27/2020   History of Present Illness Pt is an 80 y/o male who presented 11/13/2020 to ED with SOB and hypoxia. Pt found to be COVID+ and with CHF exacerbation. PMH: HTN, HFmrEF, AAA, L AKA (2013), chronic subdural hematoma, CKD3B, wide complex tachycardia, BPH, hx of DVT, HLD, moderate mitral regurgitation.    PT Comments    Pt continues to display significant generalized weakness and poor insight into his deficits and problem-solving capabilities to improve his safety. He has a strong R lateral and posterior bias in sitting, benefiting from L UE support on bed rail to reduce amount of assistance needed to sit statically EOB from mod-maxA to min guard-minA. Pt was able to come to stand 2x with use of the stedy and maxAx2 today. Current recommendations remain appropriate. Will continue to follow acutely.    Recommendations for follow up therapy are one component of a multi-disciplinary discharge planning process, led by the attending physician.  Recommendations may be updated based on patient status, additional functional criteria and insurance authorization.  Follow Up Recommendations  SNF;Supervision/Assistance - 24 hour     Equipment Recommendations  Other (comment) (TBD at next venue of care)    Recommendations for Other Services       Precautions / Restrictions Precautions Precautions: Fall;Other (comment) Precaution Comments: COVID+, L AKA (does not have prosthetic in room) Restrictions Weight Bearing Restrictions: No     Mobility  Bed Mobility Overal bed mobility: Needs Assistance Bed Mobility: Supine to Sit     Supine to sit: Max assist;HOB elevated     General bed mobility comments: Cues provided to bring legs off R EOB with good initiation by pt, but maxA to lift trunk from elevated HOB even when cued to reach for bed rail and push up off bed.     Transfers Overall transfer level: Needs assistance Equipment used: Ambulation equipment used Transfers: Sit to/from Stand Sit to Stand: Max assist;+2 physical assistance;+2 safety/equipment;From elevated surface         General transfer comment: Sit to stand 1x from elevated EOB and 1x from stedy flaps using stedy each time, maxAx2 with use of bed pads as sling under buttocks to extend hips. Cues provided for pt to pull up with hands on bar and extend hips, but pt tends to push R knee laterally and his buttocks to the L. Dependent transfer bed > recliner using the stedy. Placed chair lift pad under pt (demonstrated to RN placement of pad) and educated RN verbally on use of mechanical lift.  Ambulation/Gait             General Gait Details: Unable   Stairs             Wheelchair Mobility    Modified Rankin (Stroke Patients Only)       Balance Overall balance assessment: Needs assistance Sitting-balance support: Feet supported;Bilateral upper extremity supported;Single extremity supported Sitting balance-Leahy Scale: Poor Sitting balance - Comments: Pt with strong posterior and R lateral lean sitting EOB. Pt would attempt to correct self by reaching to his R for the bed rail, but that increased his lean. Cued pt instead to reach and hold onto rail and bottom of bed with L hand, progressing from needing mod-maxA to only needing min guard-minA once he was using his L UE for support. Postural control: Posterior lean;Right lateral lean Standing balance support: Bilateral upper extremity supported Standing balance-Leahy Scale:  Zero Standing balance comment: MaxAx2 and bil UE support to stand 2x in stedy.                            Cognition Arousal/Alertness: Awake/alert Behavior During Therapy: WFL for tasks assessed/performed Overall Cognitive Status: No family/caregiver present to determine baseline cognitive functioning                                  General Comments: pt with delayed response time and difficulty sequencing tasks. Slow motor planning and some difficulty following directions though pleasant and cooperative. Poor awareness into his deficits and problem-solving how to correct his balance.      Exercises      General Comments General comments (skin integrity, edema, etc.): Positioned pt with pillows at his R trunk to prevent R lateral lean in chair, legs elevated, chair alarm on, and lift pad under pt; demonstrated to RN placement of pad and educated RN verbally on use of mechanical lift.      Pertinent Vitals/Pain Pain Assessment: Faces Faces Pain Scale: Hurts a little bit Pain Location: generalized with mobility Pain Descriptors / Indicators: Grimacing Pain Intervention(s): Limited activity within patient's tolerance;Monitored during session;Repositioned    Home Living                      Prior Function            PT Goals (current goals can now be found in the care plan section) Acute Rehab PT Goals Patient Stated Goal: to improve PT Goal Formulation: With patient Time For Goal Achievement: 12/08/20 Potential to Achieve Goals: Good Progress towards PT goals: Progressing toward goals    Frequency    Min 2X/week      PT Plan Current plan remains appropriate    Co-evaluation              AM-PAC PT "6 Clicks" Mobility   Outcome Measure  Help needed turning from your back to your side while in a flat bed without using bedrails?: A Lot Help needed moving from lying on your back to sitting on the side of a flat bed without using bedrails?: A Lot Help needed moving to and from a bed to a chair (including a wheelchair)?: Total Help needed standing up from a chair using your arms (e.g., wheelchair or bedside chair)?: Total Help needed to walk in hospital room?: Total Help needed climbing 3-5 steps with a railing? : Total 6 Click Score: 8    End of Session Equipment Utilized  During Treatment: Gait belt Activity Tolerance: Patient tolerated treatment well Patient left: in chair;with call bell/phone within reach;with chair alarm set;with nursing/sitter in room Nurse Communication: Mobility status;Need for lift equipment PT Visit Diagnosis: Unsteadiness on feet (R26.81);Muscle weakness (generalized) (M62.81);Difficulty in walking, not elsewhere classified (R26.2)     Time: [id number] PT Time Calculation (min) (ACUTE ONLY): 29 min  Charges:  $Therapeutic Activity: 23-37 mins                     Moishe Spice, PT, DPT Acute Rehabilitation Services  Pager: [phone] Office: [place] 11/27/2020, 11:15 AM

## 2020-11-27 NOTE — Progress Notes (Signed)
Family Medicine Teaching Service Daily Progress Note Intern Pager: [phone]  Patient name: Kevin [name] Medical record number: [id number] Date of birth: 1941-01-14 Age: 80 y.o. Gender: male  Primary Care Provider: Daphene Jaeger, PA-C Consultants: Cardiology, pulmonology signed off, vascular surgery to sign off Code Status: Full code  Pt Overview and Major Events to Date:  9/11 admitted thoracentesis performed 9/12 echo with EF of 20 to 25% with moderate mitral regurgitation presumed nonischemic  Assessment and Plan:  80 year old man presenting with shortness of breath and hypoxia.  Past medical history significant for hypertension, HFrEF, AAA, chronic subdural hematoma, CKD 3B, wide-complex tachycardia, BPH, history of DVT, hyperlipidemia, moderate mitral regurgitation.  Hypoxic respiratory failure in setting HFrEF EF 20 to 25% an MRI and echo  COVID-19 exacerbation His breathing was fine, weight is 192.46 pounds from 197.53 yesterday so down about 5 pounds.  It was 199 on admission. Dry weight likely 199. UOP 3 L overnight. K of 4.5 today, Mag 2.2. No peripheral edema. Took him off his 2L O2 and was saturating in the high 90s and no iWOB.  -Cardiology following, appreciate recs. Lasix per cardiology recs, may need right and left cath once stabilized. Keep K >4, Mag>2. -O2 sats 88-92% -Lasix stop today per cardiology -Strict I's and O's -Daily weights -Albuterol inhaler -Ellipta inhaler -DuoNeb prn -Outpatient management for MR  Pleural effusions s/p thoracentesis Patient's breathing is fine this morning. NGTD in pleural fluid. -Pulmonoly outpatient follow up -Supplemental Oxygen as needed  Covid-19 Cough has been the same. Has been afebrile. -Remdesivir day 5 -Decadron 6 mg otal daily for 10 days, con consider stopping if delirium worsening -wean ocygen as tolerated -incentive spirometry -as needed zofran -azithromycin, ceftriaxone ending today  9/15  Constipation/Acid Reflux Had large BM yesterday. -added miralax and pepcid yesterday  Delirium Patient did not have any calls overnight for delirium. He was oriented to person, place, situation still thinks its 2002. Waxes and wanes. -Delirium precautions  Wide-complex tachycardia Sometimes has palpitations but no chest pain  -Continue Coreg 6.25 bid per cardiology -Cardiac monitoring  AKI on CKD Today creatinine was slightly improved from 2.33 from 2.44 yesterday. Does have renal mass on abdominal/pelvis CT. UOP 3 L overnight. -lasix stop today per cardiology  AAA  Does not require therapeutic anticoagulation per vascular continue Lovenox 30 mg subq. -Outpatient vascular for endovascular repair when stabilized from acute infection  Elevated D-Dimer IVC Filter present 6.66 on admission, CTA negative, DVT u/s negative . 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 -Oxycodone acetaminophen 5-325 bid prn  HTN Blood pressures have been 115/95s this AM -continue home coreg  BPH External cathether in place, encouraged urinal use -Home flomax  FEN/GI: Heart PPx: Lovenox 30 mg Dispo: Medically stable. SNF pending bed placement.  Subjective:  No complaints this AM, has palpitations that is his norma, had BM today.  Objective: Temp:  [98.2 F (36.8 C)-98.3 F (36.8 C)] 98.3 F (36.8 C) (09/15 0417) Pulse Rate:  [63-73] 70 (09/15 0417) Resp:  [16-25] 16 (09/15 0418) BP: (105-121)/(75-102) 115/102 (09/15 0417) SpO2:  [98 %-100 %] 100 % (09/15 0418) Weight:  [87.3 kg-89.6 kg] 87.3 kg (09/15 0507) Physical Exam: General: NAD Cardiovascular: RRR no m/r/g Respiratory: Bilateral rales, no crackles, no iWOB Abdomen: Nondistended, nontender to palpation  Extremities: No peripheral edema, chronic venous stasis  Laboratory: Recent Labs  Lab 12/10/2020 1644 11/23/20 0431 11/24/20 0218  WBC 4.4 3.9* 6.8  HGB 14.9 14.1 13.3  HCT 49.6 46.1 41.9  PLT 181 186 163   Recent  Labs  Lab 12/07/2020 1644 11/23/20 0431 11/23/20 1744 11/24/20 0218 11/25/20 0259 11/26/20 0301 11/27/20 0311  NA 138   < >  --    < > 134* 133* 133*  K 3.6   < >  --    < > 4.4 4.3 4.5  CL 103   < >  --    < > 102 94* 96*  CO2 24   < >  --    < > '22 25 29  '$ BUN 24*   < >  --    < > 50* 57* 64*  CREATININE 1.85*   < >  --    < > 2.24* 2.44* 2.33*  CALCIUM 8.2*   < >  --    < > 7.9* 8.0* 7.7*  PROT 6.3*  --  5.9*  --   --   --   --   BILITOT 1.1  --   --   --   --   --   --   ALKPHOS 74  --   --   --   --   --   --   ALT 11  --   --   --   --   --   --   AST 15  --   --   --   --   --   --   GLUCOSE 112*   < >  --    < > 115* 120* 126*   < > = values in this interval not displayed.    Imaging/Diagnostic Tests:   Gerrit Heck, MD 11/27/2020, 7:01 AM PGY-1, [place] Intern pager: [phone], text pages welcome

## 2020-11-27 NOTE — TOC Progression Note (Signed)
Transition of Care [facility]) - Progression Note    Patient Details  Name: Kevin [name] MRN: [id number] Date of Birth: Aug 21, 1940  Transition of Care [facility]) CM/SW [place], [place] Phone Number: 11/27/2020, 3:52 PM  Clinical Narrative:      CSW tried to call patients son [name] and sister Zigmund Daniel. CSW unable to reach patients son or sister to discuss SNF bed offers. CSW will continue to try and get a hold of patients son [name] and sister Zigmund Daniel to discuss patients dc plan for SNF placement. CSW will continue to follow and assist with patients dc planning needs.  Expected Discharge Plan: [place] Barriers to Discharge: Continued Medical Work up  Expected Discharge Plan and Services Expected Discharge Plan: [place] In-house Referral: Clinical Social Work                                             Social Determinants of Health (SDOH) Interventions    Readmission Risk Interventions No flowsheet data found.

## 2020-11-28 DIAGNOSIS — U071 COVID-19: Secondary | ICD-10-CM | POA: Diagnosis not present

## 2020-11-28 DIAGNOSIS — I5043 Acute on chronic combined systolic (congestive) and diastolic (congestive) heart failure: Secondary | ICD-10-CM | POA: Diagnosis not present

## 2020-11-28 LAB — BASIC METABOLIC PANEL
Anion gap: 10 (ref 5–15)
BUN: 65 mg/dL — ABNORMAL HIGH (ref 8–23)
CO2: 29 mmol/L (ref 22–32)
Calcium: 8 mg/dL — ABNORMAL LOW (ref 8.9–10.3)
Chloride: 94 mmol/L — ABNORMAL LOW (ref 98–111)
Creatinine, Ser: 2.38 mg/dL — ABNORMAL HIGH (ref 0.61–1.24)
GFR, Estimated: 27 mL/min — ABNORMAL LOW (ref >60)
Glucose, Bld: 139 mg/dL — ABNORMAL HIGH (ref 70–99)
Potassium: 5.9 mmol/L — ABNORMAL HIGH (ref 3.5–5.1)
Sodium: 133 mmol/L — ABNORMAL LOW (ref 135–145)

## 2020-11-28 LAB — POTASSIUM: Potassium: 4.9 mmol/L (ref 3.5–5.1)

## 2020-11-28 NOTE — Progress Notes (Signed)
   No new suggestions today.  Please call with questions over the weekend.  Call Mr. Shong with the results and send results to [place], [place], PA-C

## 2020-11-28 NOTE — TOC Progression Note (Addendum)
Transition of Care [facility]) - Progression Note    Patient Details  Name: Kevin [name] MRN: [id number] Date of Birth: 1940-04-14  Transition of Care [facility]) CM/SW [place], [place] Phone Number: 11/28/2020, 2:14 PM  Clinical Narrative:     Update- 3:36pm CSW still unable to get a hold of patient son or sister to discuss patients dc plan.  CSW called and left voicemail for patients son [name]. CSW awaiting callback to provide patients son with SNF bed offers. CSW will continue to follow and assist with dc planning needs.  Expected Discharge Plan: [place] Barriers to Discharge: Continued Medical Work up  Expected Discharge Plan and Services Expected Discharge Plan: [place] In-house Referral: Clinical Social Work                                             Social Determinants of Health (SDOH) Interventions    Readmission Risk Interventions No flowsheet data found.

## 2020-11-28 NOTE — Progress Notes (Signed)
Family Medicine Teaching Service Daily Progress Note Intern Pager: [phone]  Patient name: TAFF LEET Medical record number: [id number] Date of birth: 29-Apr-1940 Age: 80 y.o. Gender: male  Primary Care Provider: Daphene Jaeger, PA-C Consultants: cardiology, pulmonology signed off, vascular surgery signed off Code Status: Full code  Pt Overview and Major Events to Date:  9/11 admitted with thoracentesis performed 9/12 echo with EF of 20-25% with moderate MR   Assessment and Plan:  80 year old man presenting with shortness of breath and hypoxia. PMH significant for HTN, HFrEF, AAA, chronicc subdural hematoma, CKD3B, wide complex tachycardia, BPH, DVT hx, hyperlipidemia, moderate MR  Hypoxic respiratory failure in setting of HFrEF EF 20-25% on Echo and MR Breathing this morning was similar to yesterday. Weight is 193.78 from 192.46 pounds yesterday. Dry weight likely 199. UOP 1.175 L overnight. K of 4.9. No peripheral edema on exam today. He's been saturating well on room air. -Cardiology following, appreciate recs. Lasix held yesterday. Keep K>4, Mag>2. Per cardiology may need R and L cath once stabilized likely outpatient  -O2 sats 88-92% -Strict I's and O's -Daily weights -Albuterol inhaler -ellipta inhaler -duoneb prn -outpatient mgmt for MR  Pleural effusions s/p thoracentesis NGTD in pleural fluid. -Pulmonology outpatient f/u -supplemental O2 as needed  Covid-19 Cough has remained the same , has remained afebrile  -Completed remdesivir course -decadron 6 mg daily for 10 days, can consider stopping if delirium worsening (day 6) -wean o2 as tolerated  -incentive spirometry -prn zofran -s/p azithro/ctx course  AKI on CKD  Today creatinine is 2.38 from 2.33 yesterday. Has renal mass on abd/pelvis CT. UOP of 1.175 overnight. -Monitor, encourage PO fluids within his diet restriction range given HF   Wide complex tachycardia Palpitations as usual but no chest pain   -Coreg 6.25 bid per cardiology  Constipation/Acid Reflux -miralax, pepcid  Delirium No call overnight. Stable. -Delirium precautions  AAA Does not require therapeutic anticoagualtion per vascular, continue Lovenox 30 mg subq. -Outpatient vascular for EVAR when stabilized  Elevated D-dimer IVF filter present 6.66 on admission, CTA negative, DVT u/s negative. 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 signed -oxycodone acetaminophen 5-325 bid prn  HTN Blood pressures around 120s/100s -Continue home coreg  BPH External catheter in place, encouraged urinal use  FEN/GI: Heart PPx: Lovenox 30 mg Dispo:SNF pending bed placement  Subjective:  No complaints this AM. Eating breakfast. Asking about his wheelchair  Objective: Temp:  [97.8 F (36.6 C)-98.1 F (36.7 C)] 98.1 F (36.7 C) (09/15 2310) Pulse Rate:  [58-72] 58 (09/15 2310) Resp:  [18-21] 20 (09/15 2310) BP: (115-130)/(69-108) 130/108 (09/15 2310) SpO2:  [88 %-99 %] 98 % (09/15 2310) Weight:  [87.3 kg] 87.3 kg (09/15 0507) Physical Exam: General: NAD Cardiovascular: RRR no m/r/g Respiratory: Mild rales bilaterally, no iWOB on RA Abdomen: Nondistended nontender to palpation Extremities: No LE edema  Laboratory: Recent Labs  Lab 11/20/2020 1644 11/23/20 0431 11/24/20 0218  WBC 4.4 3.9* 6.8  HGB 14.9 14.1 13.3  HCT 49.6 46.1 41.9  PLT 181 186 163   Recent Labs  Lab 11/20/2020 1644 11/23/20 0431 11/23/20 1744 11/24/20 0218 11/26/20 0301 11/27/20 0311 11/28/20 0130  NA 138   < >  --    < > 133* 133* 133*  K 3.6   < >  --    < > 4.3 4.5 5.9*  CL 103   < >  --    < > 94* 96* 94*  CO2 24   < >  --    < >  $'25 29 29  'p$ BUN 24*   < >  --    < > 57* 64* 65*  CREATININE 1.85*   < >  --    < > 2.44* 2.33* 2.38*  CALCIUM 8.2*   < >  --    < > 8.0* 7.7* 8.0*  PROT 6.3*  --  5.9*  --   --   --   --   BILITOT 1.1  --   --   --   --   --   --   ALKPHOS 74  --   --   --   --   --   --   ALT 11  --   --   --   --   --   --   AST  15  --   --   --   --   --   --   GLUCOSE 112*   < >  --    < > 120* 126* 139*   < > = values in this interval not displayed.      Imaging/Diagnostic Tests:   Gerrit Heck, MD 11/28/2020, 4:20 AM PGY-1, [place] Intern pager: [phone], text pages welcome

## 2020-11-28 NOTE — Progress Notes (Signed)
FPTS Interim Progress Note  S: Patient is a 80 year old male presented with shortness of breath and found to be COVID-19 positive in the setting of heart failure exacerbation.  Currently patient is below his dry weight and does not appear volume overloaded on exam.  He is awaiting SNF placement and cardiac cath as an outpatient.  This evening he voices no complaints and denies shortness of breath.  O: BP (!) 110/96 (BP Location: Right Arm)   Pulse (!) 56   Temp 98.1 F (36.7 C) (Oral)   Resp 18   Ht '5\' 5"'$  (1.651 m)   Wt 193 lb 12.6 oz (87.9 kg)   SpO2 98%   BMI 32.25 kg/m   General: elderly male lying in bed, NAD Resp: unlabored respirations CV: weak peripheral pulse, extremities are somewhat cold, no lower extremity edema  A/P: -Patient awaiting SNF placement - Remainder per day team  Corky Sox PGY-1, Psychiatry Service pager [phone]

## 2020-11-29 DIAGNOSIS — I5043 Acute on chronic combined systolic (congestive) and diastolic (congestive) heart failure: Secondary | ICD-10-CM

## 2020-11-29 DIAGNOSIS — U071 COVID-19: Secondary | ICD-10-CM | POA: Diagnosis not present

## 2020-11-29 DIAGNOSIS — I5033 Acute on chronic diastolic (congestive) heart failure: Secondary | ICD-10-CM | POA: Diagnosis not present

## 2020-11-29 LAB — BASIC METABOLIC PANEL
Anion gap: 8 (ref 5–15)
BUN: 57 mg/dL — ABNORMAL HIGH (ref 8–23)
CO2: 32 mmol/L (ref 22–32)
Calcium: 7.7 mg/dL — ABNORMAL LOW (ref 8.9–10.3)
Chloride: 94 mmol/L — ABNORMAL LOW (ref 98–111)
Creatinine, Ser: 2.04 mg/dL — ABNORMAL HIGH (ref 0.61–1.24)
GFR, Estimated: 32 mL/min — ABNORMAL LOW (ref >60)
Glucose, Bld: 111 mg/dL — ABNORMAL HIGH (ref 70–99)
Potassium: 4.6 mmol/L (ref 3.5–5.1)
Sodium: 134 mmol/L — ABNORMAL LOW (ref 135–145)

## 2020-11-29 MED ORDER — MELATONIN 3 MG PO TABS
3.0000 mg | ORAL_TABLET | Freq: Every day | ORAL | Status: DC
  Administered 2020-11-29 – 2020-12-02 (×4): 3 mg via ORAL
  Filled 2020-11-29 (×4): qty 1

## 2020-11-29 NOTE — Progress Notes (Signed)
Progress Note  Patient Name: Kevin [name] Date of Encounter: 11/29/2020  [facility] Cardiologist: Johnsie Cancel   Subjective   80 year old gentleman who was admitted with respiratory difficulties.  He was found of COVID.  He had bilateral pleural effusions.  CTA of the chest was negative for pulmonary embolus but did note is 6 cm abdominal aortic aneurysm.  Is a question of a right hilar mass.  Echocardiogram from September 11 reveals severely depressed left ventricular systolic function with an EF of 20 to 25%.  There is mild LVH.  He has grade 2 diastolic dysfunction.  He has diuresed 7 L so far during this admission. Blood pressure and heart rate have been well controlled.  His O2 saturation this morning was 97%.  CKD remains an issue.  Creatinine is slightly improved this morning at 2.04.  His BUN is 57.    Inpatient Medications    Scheduled Meds:  albuterol  2 puff Inhalation BID   atorvastatin  40 mg Oral Daily   carvedilol  6.25 mg Oral BID WC   dexamethasone  6 mg Oral Daily   enoxaparin (LOVENOX) injection  30 mg Subcutaneous Daily   famotidine  10 mg Oral Daily   polyethylene glycol  17 g Oral Daily   tamsulosin  0.4 mg Oral QPC supper   umeclidinium bromide  1 puff Inhalation Daily   Continuous Infusions:   PRN Meds: acetaminophen, ipratropium-albuterol, ondansetron (ZOFRAN) IV, oxyCODONE-acetaminophen   Vital Signs    Vitals:   11/28/20 1604 11/28/20 1955 11/29/20 0018 11/29/20 0417  BP: (!) 130/105 (!) 110/96 (!) 119/94 (!) 116/96  Pulse:  (!) 56 (!) 55 63  Resp: '20 18 18 19  '$ Temp:  98.1 F (36.7 C) 98.3 F (36.8 C) 98.4 F (36.9 C)  TempSrc:  Oral Oral Oral  SpO2: 98% 98% 97% 97%  Weight:    86.3 kg  Height:        Intake/Output Summary (Last 24 hours) at 11/29/2020 0838 Last data filed at 11/29/2020 0500 Gross per 24 hour  Intake --  Output 1175 ml  Net -1175 ml    Last 3 Weights 11/29/2020 11/28/2020 11/27/2020  Weight (lbs) 190 lb 4.8  oz 193 lb 12.6 oz 192 lb 7.4 oz  Weight (kg) 86.32 kg 87.9 kg 87.3 kg      Telemetry    Sinus rhythm at 68  - Personally Reviewed  ECG     - Personally Reviewed  Physical Exam   Gen:  elderly male, NAD   Neck: No JVD Cardiac:  RR  Respiratory:  fewer rales than previous  GI: Soft, nontender, non-distended  MS: s/p left leg amputation   Neuro:  Nonfocal ,  appears weak  Psych: Normal affect   Labs    High Sensitivity Troponin:   Recent Labs  Lab 11/20/2020 1821 11/23/20 0134 11/23/20 0431 11/23/20 1218 11/23/20 1744  TROPONINIHS 94* 91* 132* 112* 118*       Chemistry Recent Labs  Lab 11/24/2020 1644 11/23/20 0431 11/23/20 1744 11/24/20 0218 11/27/20 0311 11/28/20 0130 11/28/20 0450 11/29/20 0247  NA 138   < >  --    < > 133* 133*  --  134*  K 3.6   < >  --    < > 4.5 5.9* 4.9 4.6  CL 103   < >  --    < > 96* 94*  --  94*  CO2 24   < >  --    < >  29 29  --  32  GLUCOSE 112*   < >  --    < > 126* 139*  --  111*  BUN 24*   < >  --    < > 64* 65*  --  57*  CREATININE 1.85*   < >  --    < > 2.33* 2.38*  --  2.04*  CALCIUM 8.2*   < >  --    < > 7.7* 8.0*  --  7.7*  PROT 6.3*  --  5.9*  --   --   --   --   --   ALBUMIN 2.4*  --   --   --  2.4*  --   --   --   AST 15  --   --   --   --   --   --   --   ALT 11  --   --   --   --   --   --   --   ALKPHOS 74  --   --   --   --   --   --   --   BILITOT 1.1  --   --   --   --   --   --   --   GFRNONAA 36*   < >  --    < > 28* 27*  --  32*  ANIONGAP 11   < >  --    < > 8 10  --  8   < > = values in this interval not displayed.      Hematology Recent Labs  Lab 12/09/2020 1644 11/23/20 0431 11/24/20 0218  WBC 4.4 3.9* 6.8  RBC 5.75 5.46 5.03  HGB 14.9 14.1 13.3  HCT 49.6 46.1 41.9  MCV 86.3 84.4 83.3  MCH 25.9* 25.8* 26.4  MCHC 30.0 30.6 31.7  RDW 17.3* 17.2* 16.8*  PLT 181 186 163     BNP Recent Labs  Lab 12/12/2020 1644  BNP 1,386.0*      DDimer  Recent Labs  Lab 11/26/2020 1735  DDIMER 6.66*       Radiology    No results found.  Cardiac Studies   Echocardiogram 11/23/20: 1. MR not well interrogated but may be severe; suggest TEE to further  assess if clinically indicated.   2. Left ventricular ejection fraction, by estimation, is 20 to 25%. The  left ventricle has severely decreased function. The left ventricle  demonstrates global hypokinesis. The left ventricular internal cavity size  was severely dilated. There is mild  left ventricular hypertrophy. Left ventricular diastolic parameters are  consistent with Grade II diastolic dysfunction (pseudonormalization).   3. Right ventricular systolic function is normal. The right ventricular  size is normal.   4. Left atrial size was mildly dilated.   5. The mitral valve is normal in structure. Moderate to severe mitral  valve regurgitation. No evidence of mitral stenosis.   6. Tricuspid valve regurgitation is moderate.   7. The aortic valve is normal in structure. Aortic valve regurgitation is  not visualized. No aortic stenosis is present.   8. The inferior vena cava is normal in size with greater than 50%  respiratory variability, suggesting right atrial pressure of 3 mmHg.   FINDINGS   Left Ventricle: Left ventricular ejection fraction, by estimation, is 20  to 25%. The left ventricle has severely decreased function. The left  ventricle demonstrates global hypokinesis. The left  ventricular internal  cavity size was severely dilated.  There is mild left ventricular hypertrophy. Left ventricular diastolic  parameters are consistent with Grade II diastolic dysfunction  (pseudonormalization).   Patient Profile     80 y.o. male with a PMH of chronic combined CHF, CAD (moderate disease on cath in 2007), HTN, HLD, mitral regurgitation, AAA (6.2 cm 08/2020), COPD, gangrene of left leg s/p AKA in 2013, history of PE/DVT, CKD stage 3b, and tobacco abuse, who presented with acute hypoxemic respiratory failure in the setting of  pleural effusions, compressive atelectasis, and COVID-19 with bacterial PNA, who is being followed by cardiology for the evaluation of acute on chronic combined CHF.   Assessment & Plan    1. Acute hypoxic respiratory failure: Presented with shortness of breath.  He was found to have COVID-19.  He had bilateral pleural effusions on chest x-ray.  CTA chest did not reveal PE though did note occlusion of right bronchus intermedius and right middle/lower lobe bronchi.  He was seen by pulmonology and recommended for diuresis and antibiotics for suspected bacterial coinfection.  He underwent thoracentesis with 1400 cc fluid removed from right pleural space.    Continues to make some improvement   2. Acute on chronic combined CHF: has known longstanding history of CHF dating back to 2007 with EF 20%; lost to follow-up with cardiology since 2015. He presented 12/06/2020 with SOB.  CHF likely contributing to #1.  Echocardiogram this admission showed EF 20-25% global hypokinesis, severe LV dilation, mild LVH, G2 DD, mild LAE, and moderate to severe MR.  Lasix is now on hold - creatinine is improving   Cont coreg 6.25 BID   3. Elevated HsTrop in patient with CAD: known moderate CAD on cath in 2007. HsTrop peaked at 132 and trended down; trend not c/w ACS. Suspect demand ischemia in the setting of #1/2. EKG with chronic LBBB.  - Continue statin and Bblocker  Consider further ischemic eval once he is better   4. Mitral regurgitation: moderate-severe on echo this admission. Felt to be somewhat functional in the setting of CHF exacerbation and COVID-19 illness.   Hopefully this will improve with his diuresis   5. AAA: 6.2 cm on CT 08/2020. Seen by VVS and recommended for outpatient followup for EVAR consideration.  - Continue with plans for close outpatient VVS follow-up  6. HTN: BP stable - Continue coreg  7. HLD: No recent lipids - Continue atorvastatin     Mertie Moores, MD  11/29/2020 8:46 AM     [facility] [place],  [place],   [address] Phone: [phone]; Fax: [phone]

## 2020-11-29 NOTE — Progress Notes (Signed)
Family Medicine Teaching Service Daily Progress Note Intern Pager: [phone]  Patient name: Kevin [name] Medical record number: [id number] Date of birth: 07-26-1940 Age: 80 y.o. Gender: male  Primary Care Provider: Daphene Jaeger, PA-C Consultants: cardiology, Pulm (s/o), VVS (s/o) Code Status: FULL  Pt Overview and Major Events to Date:  9/11 admitted with thoracentesis performed 9/12 echo with EF of 20-25% with moderate MR   Assessment and Plan:  80 year old man presenting with shortness of breath and hypoxia. PMH significant for HTN, HFrEF, AAA, chronicc subdural hematoma, CKD3B, wide complex tachycardia, BPH, DVT hx, hyperlipidemia, moderate MR  Hypoxic respiratory failure in setting of HFrEF EF 20-25% on Echo and MR 97% RA. Dry wt 199, He his 193 lbs today. UOP 1.1L. K 4.6. Cardiology following, appreciate recs. Keep K>4, Mag>2. Per cardiology may need R and L cath once stabilized likely outpatient  -O2 sats 88-92% -Strict I's and O's -Daily weights -Albuterol twice daily -ellipta inhaler daily -duoneb every 4 hour as needed - Coreg 6.25 mg twice daily  Covid-19 Day 7 of illness. -Decadron 6 mg daily for 10 days  AKI on CKD  Cr 2.04>2.38. - Avoid nephrotoxic drugs - Flomax 0.4 mg daily  FEN/GI: Heart healthy PPx: Lovenox 30 Dispo:SNF  pending bed availability .   Subjective:  No concern at this time. Denies discomfort  Objective: Temp:  [98.1 F (36.7 C)-98.5 F (36.9 C)] 98.4 F (36.9 C) (09/17 0417) Pulse Rate:  [55-84] 63 (09/17 0417) Resp:  [16-20] 19 (09/17 0417) BP: (110-133)/(87-105) 116/96 (09/17 0417) SpO2:  [97 %-100 %] 97 % (09/17 0417) Physical Exam: General: Appears to not feel well, no acute distress. Age appropriate. Cardiac: RRR, normal heart sounds, no murmurs Respiratory: CTAB, normal effort Abdomen: soft, nontender, nondistended Extremities: No RLE edema. Lt. AKA Skin: Warm and dry, no rashes noted Neuro: alert and oriented  x4 Psych: normal affect  Laboratory: Recent Labs  Lab 12/12/2020 1644 11/23/20 0431 11/24/20 0218  WBC 4.4 3.9* 6.8  HGB 14.9 14.1 13.3  HCT 49.6 46.1 41.9  PLT 181 186 163   Recent Labs  Lab 11/28/2020 1644 11/23/20 0431 11/23/20 1744 11/24/20 0218 11/27/20 0311 11/28/20 0130 11/28/20 0450 11/29/20 0247  NA 138   < >  --    < > 133* 133*  --  134*  K 3.6   < >  --    < > 4.5 5.9* 4.9 4.6  CL 103   < >  --    < > 96* 94*  --  94*  CO2 24   < >  --    < > 29 29  --  32  BUN 24*   < >  --    < > 64* 65*  --  57*  CREATININE 1.85*   < >  --    < > 2.33* 2.38*  --  2.04*  CALCIUM 8.2*   < >  --    < > 7.7* 8.0*  --  7.7*  PROT 6.3*  --  5.9*  --   --   --   --   --   BILITOT 1.1  --   --   --   --   --   --   --   ALKPHOS 74  --   --   --   --   --   --   --   ALT 11  --   --   --   --   --   --   --  AST 15  --   --   --   --   --   --   --   GLUCOSE 112*   < >  --    < > 126* 139*  --  111*   < > = values in this interval not displayed.    Imaging/Diagnostic Tests: No new images or procedures.  Gerlene Fee, DO 11/29/2020, 5:18 AM PGY-3, [place] Intern pager: [phone], text pages welcome

## 2020-11-29 NOTE — TOC Progression Note (Addendum)
Transition of Care [facility]) - Progression Note    Patient Details  Name: Kevin [name] MRN: [id number] Date of Birth: 05/29/1940  Transition of Care [facility]) CM/SW [place], [place] Phone Number: [phone] 11/29/2020, 12:19 PM  Clinical Narrative:     CSW attempted to call pt's son however had to leave a VM.  CSW received a return call form son however was on other line. CSW attempted to call son back however had to leave another VM.  TOC team will continue to assist with discharge planning needs.   Expected Discharge Plan: [place] Barriers to Discharge: Continued Medical Work up  Expected Discharge Plan and Services Expected Discharge Plan: [place] In-house Referral: Clinical Social Work                                             Social Determinants of Health (SDOH) Interventions    Readmission Risk Interventions No flowsheet data found.

## 2020-11-30 DIAGNOSIS — I10 Essential (primary) hypertension: Secondary | ICD-10-CM | POA: Diagnosis not present

## 2020-11-30 DIAGNOSIS — I5023 Acute on chronic systolic (congestive) heart failure: Secondary | ICD-10-CM | POA: Diagnosis not present

## 2020-11-30 DIAGNOSIS — U071 COVID-19: Secondary | ICD-10-CM | POA: Diagnosis not present

## 2020-11-30 DIAGNOSIS — J9601 Acute respiratory failure with hypoxia: Secondary | ICD-10-CM | POA: Diagnosis not present

## 2020-11-30 LAB — BASIC METABOLIC PANEL WITH GFR
Anion gap: 8 (ref 5–15)
BUN: 56 mg/dL — ABNORMAL HIGH (ref 8–23)
CO2: 29 mmol/L (ref 22–32)
Calcium: 8 mg/dL — ABNORMAL LOW (ref 8.9–10.3)
Chloride: 94 mmol/L — ABNORMAL LOW (ref 98–111)
Creatinine, Ser: 2.03 mg/dL — ABNORMAL HIGH (ref 0.61–1.24)
GFR, Estimated: 33 mL/min — ABNORMAL LOW
Glucose, Bld: 126 mg/dL — ABNORMAL HIGH (ref 70–99)
Potassium: 5 mmol/L (ref 3.5–5.1)
Sodium: 131 mmol/L — ABNORMAL LOW (ref 135–145)

## 2020-11-30 NOTE — Progress Notes (Signed)
Progress Note  Patient Name: Kevin [name] Date of Encounter: 11/30/2020  [facility] Cardiologist: Johnsie Cancel   Subjective   80 year old gentleman who was admitted with respiratory difficulties.  He was found of COVID.  He had bilateral pleural effusions.  CTA of the chest was negative for pulmonary embolus but did note is 6 cm abdominal aortic aneurysm.  Is a question of a right hilar mass.  Echocardiogram from September 11 reveals severely depressed left ventricular systolic function with an EF of 20 to 25%.  There is mild LVH.  He has grade 2 diastolic dysfunction.  He has diuresed 7 L so far during this admission. Blood pressure and heart rate have been well controlled.  His O2 saturation this morning was 97%.  CKD remains an issue.  Creatinine is slightly improved this morning at 2.04.  His BUN is 57.  Is breathing and feeling much better   Inpatient Medications    Scheduled Meds:  albuterol  2 puff Inhalation BID   atorvastatin  40 mg Oral Daily   carvedilol  6.25 mg Oral BID WC   dexamethasone  6 mg Oral Daily   enoxaparin (LOVENOX) injection  30 mg Subcutaneous Daily   famotidine  10 mg Oral Daily   melatonin  3 mg Oral QHS   polyethylene glycol  17 g Oral Daily   tamsulosin  0.4 mg Oral QPC supper   umeclidinium bromide  1 puff Inhalation Daily   Continuous Infusions:   PRN Meds: acetaminophen, ipratropium-albuterol, ondansetron (ZOFRAN) IV, oxyCODONE-acetaminophen   Vital Signs    Vitals:   11/29/20 2024 11/29/20 2028 11/30/20 0339 11/30/20 0834  BP: 114/81  (!) 122/94   Pulse: (!) 58  (!) 57   Resp: 18  15   Temp: 98.6 F (37 C)  98.6 F (37 C)   TempSrc: Oral  Oral   SpO2: 100% 99% 99% 97%  Weight:   86.6 kg   Height:        Intake/Output Summary (Last 24 hours) at 11/30/2020 0934 Last data filed at 11/30/2020 0340 Gross per 24 hour  Intake --  Output 1170 ml  Net -1170 ml    Last 3 Weights 11/30/2020 11/29/2020 11/28/2020  Weight (lbs)  191 lb 190 lb 4.8 oz 193 lb 12.6 oz  Weight (kg) 86.637 kg 86.32 kg 87.9 kg      Telemetry    Sinus rhythm at 68  - Personally Reviewed  ECG     - Personally Reviewed  Physical Exam   Physical Exam: Blood pressure (!) 122/94, pulse (!) 57, temperature 98.6 F (37 C), temperature source Oral, resp. rate 15, height '5\' 5"'$  (1.651 m), weight 86.6 kg, SpO2 97 %.  GEN:  middle age , chronically ill appearing male  HEENT: Normal NECK: No JVD; No carotid bruits LYMPHATICS: No lymphadenopathy CARDIAC: RRR  , + systolic murmur RESPIRATORY:  Clear to auscultation without rales, wheezing or rhonchi  ABDOMEN: Soft, non-tender, non-distended MUSCULOSKELETAL:  right leg, no edema, chronic stasis changes,  left leg - AKA SKIN: Warm and dry NEUROLOGIC:  Alert and oriented x 3   Labs    High Sensitivity Troponin:   Recent Labs  Lab 11/26/2020 1821 11/23/20 0134 11/23/20 0431 11/23/20 1218 11/23/20 1744  TROPONINIHS 94* 91* 132* 112* 118*       Chemistry Recent Labs  Lab 11/23/20 1744 11/24/20 0218 11/27/20 0311 11/28/20 0130 11/28/20 0450 11/29/20 0247 11/30/20 0328  NA  --    < >  133* 133*  --  134* 131*  K  --    < > 4.5 5.9* 4.9 4.6 5.0  CL  --    < > 96* 94*  --  94* 94*  CO2  --    < > 29 29  --  32 29  GLUCOSE  --    < > 126* 139*  --  111* 126*  BUN  --    < > 64* 65*  --  57* 56*  CREATININE  --    < > 2.33* 2.38*  --  2.04* 2.03*  CALCIUM  --    < > 7.7* 8.0*  --  7.7* 8.0*  PROT 5.9*  --   --   --   --   --   --   ALBUMIN  --   --  2.4*  --   --   --   --   GFRNONAA  --    < > 28* 27*  --  32* 33*  ANIONGAP  --    < > 8 10  --  8 8   < > = values in this interval not displayed.      Hematology Recent Labs  Lab 11/24/20 0218  WBC 6.8  RBC 5.03  HGB 13.3  HCT 41.9  MCV 83.3  MCH 26.4  MCHC 31.7  RDW 16.8*  PLT 163     BNP No results for input(s): BNP, PROBNP in the last 168 hours.    DDimer  No results for input(s): DDIMER in the last 168  hours.    Radiology    No results found.  Cardiac Studies   Echocardiogram 11/23/20: 1. MR not well interrogated but may be severe; suggest TEE to further  assess if clinically indicated.   2. Left ventricular ejection fraction, by estimation, is 20 to 25%. The  left ventricle has severely decreased function. The left ventricle  demonstrates global hypokinesis. The left ventricular internal cavity size  was severely dilated. There is mild  left ventricular hypertrophy. Left ventricular diastolic parameters are  consistent with Grade II diastolic dysfunction (pseudonormalization).   3. Right ventricular systolic function is normal. The right ventricular  size is normal.   4. Left atrial size was mildly dilated.   5. The mitral valve is normal in structure. Moderate to severe mitral  valve regurgitation. No evidence of mitral stenosis.   6. Tricuspid valve regurgitation is moderate.   7. The aortic valve is normal in structure. Aortic valve regurgitation is  not visualized. No aortic stenosis is present.   8. The inferior vena cava is normal in size with greater than 50%  respiratory variability, suggesting right atrial pressure of 3 mmHg.   FINDINGS   Left Ventricle: Left ventricular ejection fraction, by estimation, is 20  to 25%. The left ventricle has severely decreased function. The left  ventricle demonstrates global hypokinesis. The left ventricular internal  cavity size was severely dilated.  There is mild left ventricular hypertrophy. Left ventricular diastolic  parameters are consistent with Grade II diastolic dysfunction  (pseudonormalization).   Patient Profile     80 y.o. male with a PMH of chronic combined CHF, CAD (moderate disease on cath in 2007), HTN, HLD, mitral regurgitation, AAA (6.2 cm 08/2020), COPD, gangrene of left leg s/p AKA in 2013, history of PE/DVT, CKD stage 3b, and tobacco abuse, who presented with acute hypoxemic respiratory failure in the setting  of pleural effusions, compressive atelectasis, and COVID-19  with bacterial PNA, who is being followed by cardiology for the evaluation of acute on chronic combined CHF.   Assessment & Plan    1. Acute hypoxic respiratory failure: Presented with shortness of breath.  He was found to have COVID-19.  He had bilateral pleural effusions on chest x-ray.  CTA chest did not reveal PE though did note occlusion of right bronchus intermedius and right middle/lower lobe bronchi.  He was seen by pulmonology and recommended for diuresis and antibiotics for suspected bacterial coinfection.  He underwent thoracentesis with 1400 cc fluid removed from right pleural space.    Lungs remain clear .    Seems to be getting over his COVID infection   2. Acute on chronic combined CHF: has known longstanding history of CHF dating back to 2007 with EF 20%; lost to follow-up with cardiology since 2015. He presented 12/01/2020 with SOB.  CHF likely contributing to #1.  Echocardiogram this admission showed EF 20-25% global hypokinesis, severe LV dilation, mild LVH, G2 DD, mild LAE, and moderate to severe MR.  Lasix is now on hold - creatinine is improving   Cont coreg 6.25 BID Will likely need to restart Laix or torsemid at some point - perhaps tomorrow     3. Elevated HsTrop in patient with CAD: known moderate CAD on cath in 2007. HsTrop peaked at 132 and trended down; trend not c/w ACS. Suspect demand ischemia in the setting of #1/2. EKG with chronic LBBB.  - Continue statin and Bblocker  Consider further ischemic eval once he is better   4. Mitral regurgitation: moderate-severe on echo this admission. Felt to be somewhat functional in the setting of CHF exacerbation and COVID-19 illness.     5. AAA: 6.2 cm on CT 08/2020. Seen by VVS and recommended for outpatient followup for EVAR consideration.  - Continue with plans for close outpatient VVS follow-up  6. HTN: BP stable - Continue coreg  7. HLD: No recent lipids -  Continue atorvastatin     Mertie Moores, MD  11/30/2020 9:34 AM    [facility] [place],  [place], [place]  [address] Phone: [phone]; Fax: [phone]

## 2020-11-30 NOTE — TOC Progression Note (Signed)
Transition of Care [facility]) - Progression Note    Patient Details  Name: Kevin [name] MRN: [id number] Date of Birth: 1940/06/26  Transition of Care [facility]) CM/SW [place], [place] Phone Number: [phone] 11/30/2020, 2:26 PM  Clinical Narrative:     CSW attempted to reach pt's son again and had to leave VM. CSW followed up with pt and gave him the 2 bed offers. Pt inquired about CIR stating that he did not like the bed offers and wanted to stay in the hospital to receive rehab.CSW informed pt that the level for CIR is usually assessed at evaluation. Pt was insistent that he be assessed for CIR.  CSW followed up with on PT and explained the pt's request. CSW was informed that he would be re-assessed to ascertain if he is appropriate for CIR.  TOC will follow for recommendations.    Expected Discharge Plan: [place] Barriers to Discharge: Continued Medical Work up  Expected Discharge Plan and Services Expected Discharge Plan: [place] In-house Referral: Clinical Social Work                                             Social Determinants of Health (SDOH) Interventions    Readmission Risk Interventions No flowsheet data found.

## 2020-11-30 NOTE — Progress Notes (Signed)
FPTS Interim Progress Note  S: Rounded with primary RN.  No concerns voiced.  No orders required.  O: BP 114/81 (BP Location: Left Arm)   Pulse (!) 58   Temp 98.6 F (37 C) (Oral)   Resp 18   Ht '5\' 5"'$  (1.651 m)   Wt 86.3 kg   SpO2 99%   BMI 31.67 kg/m     A/P: No changes to current plan. See daily progress note.    Lyndee Hensen, DO PGY-3, [place] Intern pager [phone]

## 2020-11-30 NOTE — Plan of Care (Signed)

## 2020-11-30 NOTE — Progress Notes (Signed)
FPTS Brief Progress Note  S:Sitting in bed watching TV.    O: BP (!) 122/94 (BP Location: Left Arm)   Pulse (!) 57   Temp 98.7 F (37.1 C) (Oral)   Resp 19   Ht '5\' 5"'$  (1.651 m)   Wt 86.6 kg   SpO2 97%   BMI 31.78 kg/m   General: Appears well, no acute distress. Age appropriate.  A/P: 1. COVID  2. Acute respiratory failure with hypoxia (HCC)  3. Pleural effusion  4. Aneurysm of infrarenal abdominal aorta (HCC)  5. Hypertension, unspecified type  6. S/P thoracentesis  - Orders reviewed. Labs for AM ordered, which was adjusted as needed.   Gerlene Fee, DO 11/30/2020, 10:15 PM PGY-3, [facility] Night Resident  Please page [phone] with questions.

## 2020-11-30 NOTE — Progress Notes (Signed)
Family Medicine Teaching Service Daily Progress Note Intern Pager: [phone]  Patient name: Kevin [name] Medical record number: [id number] Date of birth: 10/21/1940 Age: 80 y.o. Gender: male  Primary Care Provider: Daphene Jaeger, PA-C Consultants: Cardiology, Pulmonology signed off, Vascular Surgery signed off Code Status: Full  Pt Overview and Major Events to Date:  9/11 admitted with thoracentesis performed 9/12 echo with EF 20-25% with moderate MR  Assessment and Plan:  80 year old man presenting with shortness of breath and hypoxia. PMH significant for HTN, HFrEF, AAA, chronic subdural hematoma, CKD3B, wide complex tachycardia, BPH, DVT hx, hyperlipidemia, moderate MR  Hypoxic respiratory failure in the setting of HFrEF  EF 20-25% on Echo and MR Breathing this AM was similar to previously. No peripheral edema on exam, saturating well on RA. Weight is 191 today from 190.3. Dry weight likely 199. UOP 1.17L. Down 8.1 liters from admission. K of 5.0. -Cardiology follwing appreciate recs, lasix still held, may need to restart tomorrow. Keep K>4, Mag>2. Ischemic eval once stabilized likely outpatient  -O2 sats 88-92% -Strick Is and Os  -Daily weight -albuterol inhaler -ellipta inhaler -duoneb prn -outpatient management for MR  Pleural effusions s/p thoracentesis NGTD in pleural fluid -pulmonology outpatient f/u -supplemental O2 prn  Covid 19  Has remained afebrile.  -Completed remdesivir course -decadron 6 mg daily for 10 days (day 8) -wean o2 as tolerated -incentive spirometry -prn zofran -s/p azithromycin/ctx course for any superimposed bacterial inf  AKI on CKD Creatinine today is 2.03 mildly improved from 2.04 yesterday. Has renal mass on imaging. UOP 1.17 L.  -Monitor and encourage PO fluids within diet restriction  Wide complex tachycardia -Coreg 6.25 bid per cardiology  Constipation/Acid Reflux -miralax, pepcid  Delirium  No calls overnight  -delirium  precautions  AAA Does not require therapeutic anticoagulation per vascular, continue lovenox 30 mg subq. -outpatient vascular EVAR   Elevated D dimer IVC filter present Stable, Dvt u/s negative. 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 -oxycodone acetaminophern 5-325 bid prn  HTN  Blood pressures have been 120s/90s -home coreg  BPH -flomax, encourage urinal use  FEN/GI: Heart PPx: Lovenox Dispo: SNF pending covid quarantine period   Subjective:  No complaints this AM, would like wheelchair prior to d/c  Objective: Temp:  [98.1 F (36.7 C)-98.6 F (37 C)] 98.6 F (37 C) (09/18 0339) Pulse Rate:  [57-64] 57 (09/18 0339) Resp:  [15-19] 15 (09/18 0339) BP: (114-122)/(78-94) 122/94 (09/18 0339) SpO2:  [99 %-100 %] 99 % (09/18 0339) Weight:  [86.6 kg] 86.6 kg (09/18 0339) Physical Exam: General: NAD Cardiovascular: RRR no m/r/g Respiratory: coarse breath sounds bilaterally, no iWOB Abdomen: nontender to palpation, nondistended Extremities: no LE edema  Laboratory: Recent Labs  Lab 11/24/20 0218  WBC 6.8  HGB 13.3  HCT 41.9  PLT 163   Recent Labs  Lab 11/23/20 1744 11/24/20 0218 11/28/20 0130 11/28/20 0450 11/29/20 0247 11/30/20 0328  NA  --    < > 133*  --  134* 131*  K  --    < > 5.9* 4.9 4.6 5.0  CL  --    < > 94*  --  94* 94*  CO2  --    < > 29  --  32 29  BUN  --    < > 65*  --  57* 56*  CREATININE  --    < > 2.38*  --  2.04* 2.03*  CALCIUM  --    < > 8.0*  --  7.7* 8.0*  PROT 5.9*  --   --   --   --   --   GLUCOSE  --    < > 139*  --  111* 126*   < > = values in this interval not displayed.    Imaging/Diagnostic Tests:   Gerrit Heck, MD 11/30/2020, 5:55 AM PGY-1, [place] Intern pager: [phone], text pages welcome

## 2020-12-01 DIAGNOSIS — U071 COVID-19: Secondary | ICD-10-CM | POA: Diagnosis not present

## 2020-12-01 DIAGNOSIS — I10 Essential (primary) hypertension: Secondary | ICD-10-CM | POA: Diagnosis not present

## 2020-12-01 DIAGNOSIS — I5023 Acute on chronic systolic (congestive) heart failure: Secondary | ICD-10-CM | POA: Diagnosis not present

## 2020-12-01 LAB — BASIC METABOLIC PANEL
Anion gap: 8 (ref 5–15)
BUN: 52 mg/dL — ABNORMAL HIGH (ref 8–23)
CO2: 29 mmol/L (ref 22–32)
Calcium: 7.9 mg/dL — ABNORMAL LOW (ref 8.9–10.3)
Chloride: 95 mmol/L — ABNORMAL LOW (ref 98–111)
Creatinine, Ser: 1.92 mg/dL — ABNORMAL HIGH (ref 0.61–1.24)
GFR, Estimated: 35 mL/min — ABNORMAL LOW (ref >60)
Glucose, Bld: 112 mg/dL — ABNORMAL HIGH (ref 70–99)
Potassium: 4.9 mmol/L (ref 3.5–5.1)
Sodium: 132 mmol/L — ABNORMAL LOW (ref 135–145)

## 2020-12-01 MED ORDER — FUROSEMIDE 20 MG PO TABS: 20.0000 mg | ORAL_TABLET | Freq: Every day | ORAL | Status: DC

## 2020-12-01 MED ORDER — FUROSEMIDE 40 MG PO TABS
40.0000 mg | ORAL_TABLET | Freq: Every day | ORAL | Status: DC
  Administered 2020-12-01 – 2020-12-03 (×3): 40 mg via ORAL
  Filled 2020-12-01 (×3): qty 1

## 2020-12-01 NOTE — Progress Notes (Signed)
Progress Note  Patient Name: Kevin [name] Date of Encounter: 12/01/2020  [facility] Cardiologist: New - Dr. Johnsie Cancel (Previously seen by Dr. Burt Knack in 2015)  Subjective   No significant overnight events. Patient has no specific complaints this morning. No chest pain or shortness of breath. Feels back to baseline. He notes occasional brief palpitations if he gets "excited" but no sustained/prolonged palpitations.  Inpatient Medications    Scheduled Meds:  albuterol  2 puff Inhalation BID   atorvastatin  40 mg Oral Daily   carvedilol  6.25 mg Oral BID WC   dexamethasone  6 mg Oral Daily   enoxaparin (LOVENOX) injection  30 mg Subcutaneous Daily   famotidine  10 mg Oral Daily   melatonin  3 mg Oral QHS   polyethylene glycol  17 g Oral Daily   tamsulosin  0.4 mg Oral QPC supper   umeclidinium bromide  1 puff Inhalation Daily   Continuous Infusions:  PRN Meds: acetaminophen, ipratropium-albuterol, ondansetron (ZOFRAN) IV, oxyCODONE-acetaminophen   Vital Signs    Vitals:   11/30/20 1103 11/30/20 2140 12/01/20 0500 12/01/20 0538  BP:    129/89  Pulse:      Resp: 19   13  Temp:  98.7 F (37.1 C)  98.2 F (36.8 C)  TempSrc:  Oral  Oral  SpO2:  97%  100%  Weight:   89.6 kg   Height:        Intake/Output Summary (Last 24 hours) at 12/01/2020 0811 Last data filed at 12/01/2020 0400 Gross per 24 hour  Intake --  Output 600 ml  Net -600 ml   Last 3 Weights 12/01/2020 11/30/2020 11/29/2020  Weight (lbs) 197 lb 8.5 oz 191 lb 190 lb 4.8 oz  Weight (kg) 89.6 kg 86.637 kg 86.32 kg      Telemetry    Sinus bradycardia/normal sinus rhythm with rates in the 50s to 60s. PVCs and short runs of NSVT noted. - Personally Reviewed  ECG    No new ECG tracing since 11/23/2020. - Personally Reviewed  Physical Exam   GEN: No acute distress.   Neck: No JVD. Cardiac: RRR. No significant murmurs. No rubs or gallops.  Respiratory: Clear to auscultation bilaterally. No wheezes,  rhonchi, or rales. GI: Soft, non-distended, and non-tender. MS: No edema of right leg. S/p left AKA.  Skin: Slightly cool to the touch but dry. Neuro:  No focal deficits. Psych: Normal affect. Responds appropriately.  Labs    High Sensitivity Troponin:   Recent Labs  Lab 12/04/2020 1821 11/23/20 0134 11/23/20 0431 11/23/20 1218 11/23/20 1744  TROPONINIHS 94* 91* 132* 112* 118*     Chemistry Recent Labs  Lab 11/26/20 0301 11/27/20 0311 11/28/20 0130 11/29/20 0247 11/30/20 0328 12/01/20 0532  NA 133* 133*   < > 134* 131* 132*  K 4.3 4.5   < > 4.6 5.0 4.9  CL 94* 96*   < > 94* 94* 95*  CO2 25 29   < > 32 29 29  GLUCOSE 120* 126*   < > 111* 126* 112*  BUN 57* 64*   < > 57* 56* 52*  CREATININE 2.44* 2.33*   < > 2.04* 2.03* 1.92*  CALCIUM 8.0* 7.7*   < > 7.7* 8.0* 7.9*  MG 2.2  --   --   --   --   --   ALBUMIN  --  2.4*  --   --   --   --   GFRNONAA 26* 28*   < >  32* 33* 35*  ANIONGAP 14 8   < > '8 8 8   '$ < > = values in this interval not displayed.    Lipids No results for input(s): CHOL, TRIG, HDL, LABVLDL, LDLCALC, CHOLHDL in the last 168 hours.  HematologyNo results for input(s): WBC, RBC, HGB, HCT, MCV, MCH, MCHC, RDW, PLT in the last 168 hours. Thyroid No results for input(s): TSH, FREET4 in the last 168 hours.  BNPNo results for input(s): BNP, PROBNP in the last 168 hours.  DDimer No results for input(s): DDIMER in the last 168 hours.   Radiology    No results found.  Cardiac Studies   Echocardiogram 11/23/2020: Impressions: 1. MR not well interrogated but may be severe; suggest TEE to further  assess if clinically indicated.   2. Left ventricular ejection fraction, by estimation, is 20 to 25%. The  left ventricle has severely decreased function. The left ventricle  demonstrates global hypokinesis. The left ventricular internal cavity size  was severely dilated. There is mild  left ventricular hypertrophy. Left ventricular diastolic parameters are   consistent with Grade II diastolic dysfunction (pseudonormalization).   3. Right ventricular systolic function is normal. The right ventricular  size is normal.   4. Left atrial size was mildly dilated.   5. The mitral valve is normal in structure. Moderate to severe mitral  valve regurgitation. No evidence of mitral stenosis.   6. Tricuspid valve regurgitation is moderate.   7. The aortic valve is normal in structure. Aortic valve regurgitation is  not visualized. No aortic stenosis is present.   8. The inferior vena cava is normal in size with greater than 50%  respiratory variability, suggesting right atrial pressure of 3 mmHg.   Patient Profile     80 y.o. 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  Assessment & Plan    Acute Hypoxic Respiratory Failure - Presented with shortness of breath. Bilateral pleural effusions noted on chest x-ray. Chest CTA showed no PE but did note occlusion of right bronchus intermedius and right middle/lower lobe bronchi. Also found to have COVID-19 with bacterial pneumonia. - S/p right thoracentesis  on 11/23/2020 with 1400cc fluid removed. - Completed course of IV antibiotics and Remdesivir. - See recommendations for CHF below. - Otherwise, per primary team.  Acute on Chronic Combined CHF - Longstanding history of CHF dating back to 2007 with EF of 20%. - Echo this admission showed LVEF of 20-25% with severe LV dilation and global hypokinesis, mild LVH, grade 2 diastolic  dysfunction, mild left atrial enlargement, and moderate to severe MR.  - Initially diuresed with IV Lasix but stopped on 9/15. Net negative 8.7 L this admission. Weight starting to climb again - 197 lbs today up from 191 lbs yesterday. Renal function stable. - Appears euvolemic on exam but slightly cool to the touch. - Will discuss restarting diuresis with MD. - Continue Coreg 6.'25mg'$  twice daily. - No ACE/ARB/ARNI or MRA due to renal function. - Continue to monitor daily weights, strict I/Os, and renal function.  Elevated Troponin History  of CAD - History of moderate CAD noted on cath in 2007. - High-sensitivity troponin peaked at 132 and trended down. - EKG with chronic LBB. - Echo as above. - No angina. - Not consistent with ACS. Troponin elevation felt to be secondary to demand ischemia. Can discuss outpatient ischemic evaluation after recovery from present illness.  Mitral Regurgitation - Moderate to severe MR noted on Echo this admission.  - Felt to be somewhat functional in the setting of CHF exacerbation and COVID-19. - Will need ongoing outpatient monitoring.  AAA - CTA in 08/2020 showed AAA measuring 6.2cm.  - Seen by Vascular Surgery and outpatient follow-up recommended for EVAR consideration.  Hypertension - BP well controlled. - Continue Coreg as above.  Hyperlipidemia - Continue Lipitor '40mg'$  daily.  CKD Stage III-IV - Creatinine peaked at 2.44 on 9/14. Slowly improving to 1.92 today. Baseline 1.7 to 2.2. - Continue to avoid nephrotoxic agents. - Continue daily BMET.   For questions or updates, please contact [place] Please consult [email or web address] for contact info under        Signed, Darreld Mclean, PA-C  12/01/2020, 8:11 AM

## 2020-12-01 NOTE — Progress Notes (Addendum)
Inpatient Rehab Admissions Coordinator:   Per CSW request, pt was screened for CIR candidacy by Shann Medal, PT, DPT.  Note PT updated recommendations to CIR as well.  At this time we are recommending a CIR consult and I will request an order per our protocol, so that we may do a full assessment.  Please note that pt's who are accepted to CIR and have tested positive for covid-19 are eligible for admission once they have been cleared from airborne precautions.    Shann Medal, PT, DPT Admissions Coordinator [phone] 12/01/20  1:14 PM

## 2020-12-01 NOTE — TOC Progression Note (Signed)
Transition of Care [facility]) - Progression Note    Patient Details  Name: RIVERS HILDEBRAN MRN: [id number] Date of Birth: 12/30/1940  Transition of Care [facility]) CM/SW [place], [place] Phone Number: 12/01/2020, 1:10 PM  Clinical Narrative:     CSW spoke with patients son [name]. [name] confirmed that patient comes from home with him.  CSW let [name] know that patient is interested in CIR placement. [name] is in agreement to patients wishes for potential CIR placement and confirmed he can provide 24/7 supervision at home. CSW let [name] know that CIR may be reaching out to him sometime today.CSW contacted [facility] with CIR to see if they could look at patient for possible CIR placement. Urban Gibson confirmed they will look at patient to see if patient qualify's for CIR. CSW will continue to follow and assist with dc planning needs.  Expected Discharge Plan: [place] Barriers to Discharge: Continued Medical Work up  Expected Discharge Plan and Services Expected Discharge Plan: [place] In-house Referral: Clinical Social Work                                             Social Determinants of Health (SDOH) Interventions    Readmission Risk Interventions No flowsheet data found.

## 2020-12-01 NOTE — Progress Notes (Signed)
Physical Therapy Treatment Patient Details Name: Kevin [name] MRN: [id number] DOB: Sep 16, 1940 Today's Date: 12/01/2020   History of Present Illness Pt is an 80 y/o male who presented 11/30/2020 to ED with SOB and hypoxia. Pt found to be COVID+ and with CHF exacerbation. PMH: HTN, HFmrEF, AAA, L AKA (2013), chronic subdural hematoma, CKD3B, wide complex tachycardia, BPH, hx of DVT, HLD, moderate mitral regurgitation.    PT Comments    LCSWA in contact with pt son and reports 24 hour assist is available at discharge. Pt has requested CIR level rehab and was able to participate in over 40 minutes of therapy today. Pt is limited in safe mobility by slowed processing, and difficulty in command follow and sequencing in presence of decreased strength and balance. Pt is currently mod-maxA for bed mobility and total Ax2 for 4x attempts to come to standing in [place]. PT will continue to follow pt acutely.    Recommendations for follow up therapy are one component of a multi-disciplinary discharge planning process, led by the attending physician.  Recommendations may be updated based on patient status, additional functional criteria and insurance authorization.  Follow Up Recommendations  Supervision/Assistance - 24 hour;CIR     Equipment Recommendations  Wheelchair (measurements PT);Wheelchair cushion (measurements PT)    Recommendations for Other Services Rehab consult     Precautions / Restrictions Precautions Precautions: Fall;Other (comment) Precaution Comments: COVID+, L AKA (does not have prosthetic in room) Restrictions Weight Bearing Restrictions: No     Mobility  Bed Mobility Overal bed mobility: Needs Assistance Bed Mobility: Supine to Sit Rolling: Mod assist (modA for rolling L &R to place lift pad, only aware it was wrong lift pad once under pt so pt rolled each way a total of 4x for lift pad and chuck pad management, pt with heavy use of the bed rail unable to come all the way  up on his hip without assist)   Supine to sit: Max assist;HOB elevated Sit to supine: Mod assist   General bed mobility comments: With increased tactile cues able to move R LE off bed, increased difficulty with getting his trunk to upright, difficulty letting go of bed rail, modA for return of R LE to bed and squaring hips and trunk to bed.    Transfers Overall transfer level: Needs assistance Equipment used: Ambulation equipment used Transfers: Sit to/from Stand Sit to Stand: +2 physical assistance;+2 safety/equipment;From elevated surface;Total assist         General transfer comment: total Ax2, despite cuing for leading with chest and looking up and cues for pelvic rotation pt only able to bring hips up off bed but not rotate pelvis to come to standing. Incorrect lift pad in room to use [employer] so unable to move to chair today  Ambulation/Gait             General Gait Details: Unable              Balance Overall balance assessment: Needs assistance Sitting-balance support: Feet supported;Bilateral upper extremity supported;Single extremity supported Sitting balance-Leahy Scale: Poor Sitting balance - Comments: Pt with strong posterior and R lateral lean sitting EOB. Pt would attempt to correct self by reaching to his R for the bed rail, but that increased his lean. Cued pt instead to reach and hold onto rail and bottom of bed with L hand, progressing from needing mod-maxA to only needing min guard-minA once he was using his L UE for support. Postural control: Posterior lean;Right lateral lean  Standing balance support: Bilateral upper extremity supported Standing balance-Leahy Scale: Zero Standing balance comment: unablet to achieve standing                            Cognition Arousal/Alertness: Awake/alert Behavior During Therapy: WFL for tasks assessed/performed Overall Cognitive Status: No family/caregiver present to determine baseline cognitive  functioning                                 General Comments: pt pleasant and cooperative, however increased difficulty with following commands and sequencing activity, appears to have necessary strength to come to standing however unable to sequence movements despite maximal multimodal cuing      Exercises General Exercises - Lower Extremity Long Arc Quad: AROM;Right;10 reps (in available range, cues for internal rotation of hip)    General Comments General comments (skin integrity, edema, etc.): VSS on RA, one bout of v tach however leads had come off at the time      Pertinent Vitals/Pain Pain Assessment: Faces Faces Pain Scale: Hurts a little bit Pain Location: generalized with mobility Pain Descriptors / Indicators: Grimacing Pain Intervention(s): Limited activity within patient's tolerance;Monitored during session;Repositioned     PT Goals (current goals can now be found in the care plan section) Acute Rehab PT Goals Patient Stated Goal: to improve PT Goal Formulation: With patient Time For Goal Achievement: 12/08/20 Potential to Achieve Goals: Good Progress towards PT goals: Progressing toward goals    Frequency    Min 3X/week      PT Plan Discharge plan needs to be updated       AM-PAC PT "6 Clicks" Mobility   Outcome Measure  Help needed turning from your back to your side while in a flat bed without using bedrails?: A Lot Help needed moving from lying on your back to sitting on the side of a flat bed without using bedrails?: Total Help needed moving to and from a bed to a chair (including a wheelchair)?: Total Help needed standing up from a chair using your arms (e.g., wheelchair or bedside chair)?: Total Help needed to walk in hospital room?: Total Help needed climbing 3-5 steps with a railing? : Total 6 Click Score: 7    End of Session Equipment Utilized During Treatment: Gait belt Activity Tolerance: Patient tolerated treatment  well Patient left: with call bell/phone within reach;with nursing/sitter in room;in bed;with bed alarm set Nurse Communication: Mobility status;Need for lift equipment PT Visit Diagnosis: Unsteadiness on feet (R26.81);Muscle weakness (generalized) (M62.81);Difficulty in walking, not elsewhere classified (R26.2)     Time: 6226-3335 PT Time Calculation (min) (ACUTE ONLY): 44 min  Charges:  $Therapeutic Exercise: 8-22 mins $Therapeutic Activity: 23-37 mins                     Eliaz Fout B. Migdalia Dk PT, DPT Acute Rehabilitation Services Pager [phone] Office [phone]    [place] 12/01/2020, 11:57 AM

## 2020-12-01 NOTE — Progress Notes (Signed)
Family Medicine Teaching Service Daily Progress Note Intern Pager: [phone]  Patient name: Kevin [name] Medical record number: [id number] Date of birth: [birth year] Age: 80 y.o. Gender: male  Primary Care Provider: Daphene Jaeger, PA-C Consultants: Cardiology Code Status: Full code  Pt Overview and Major Events to Date:  9/11 admitted with thoracocentesis performed 9/12 Echo with EF 20 to 25% with moderate MR  Assessment and Plan:  80 year old male presenting with shortness of breath and hypoxia.  Past medical history significant for hypertension, HFrEF, AAA, chronic subdural hematoma, CKD 3B, wide-complex tachycardia, BPH, DVT history, hyperlipidemia, moderate mitral regurgitation  Hypoxic respiratory failure in the setting of HFrEF EF 20 to 25% on echo and MR Breathing this morning was similar to before.  No peripheral edema on exam saturating well on room air weight today is 197.53 pounds from 191 yesterday.  Dry weight likely 199.  Urine output was 600 mL (0.3 ml/kg/hr) last 24 hours down night 8.7 L since admission. -Cardiology following, appreciate recommendations keep potassium greater than 4 magnesium greater than 2.  Ischemic evaluation likely outpatient once stabilized. -restart Lasix  -O2 saturations 88 to 92% -Strict I's and O's -Daily weight -Albuterol inhaler -Ellipta inhaler -DuoNeb as needed -Outpatient management per MR  AKI on CKD3b Creatinine today is 1.92 from 2.03 yesterday.  Has renal mass on imaging.  Urine output of 600 mL in last 24 hours 0.3 ml/kg/hr. -Monitoring encourage p.o. fluids within diet restriction  Pleural effusions s/p thoracentesis No growth to date and pleural fluid -And pulmonology outpatient follow-up -Supplemental oxygen as needed  COVID-19 Has remained afebrile -Completed remdesivir course -Decadron 6 mg daily for 10 days (day 9) -Wean oxygen as tolerated -Incentive spirometry -As needed Zofran -S/p azithromycin and  ceftriaxone course for superimposed bacterial infection  Constipation/acid reflux -MiraLAX, Pepcid  Delirium No calls overnight -Delirium precautions  AAA Does not require therapeutic anticoagulation per vascular, continue Lovenox 30 mg subcu -Outpatient vascular EVAR  Elevated D-dimer with IVC filter presents Stable, DVT ultrasound negative.  Chronic phantom leg pain on left -Oxycodone acetaminophen 5-3 25 twice daily as needed  Hypertension  Blood pressures have been 120s over 90s. -Home Coreg  BPH -Flomax 0.4 mg -encourage urinal use  FEN/GI: Heart PPx: Lovenox Dispo: medically clear to go to SNF after COVID quarantine but patient informed social work that he wanted to be assessed for CIR, will f/u with son and social work  Subjective:  Wants to go to [facility], thinks his son will be able to take care of him but will touch base  Objective: Temp:  [98.2 F (36.8 C)-98.7 F (37.1 C)] 98.2 F (36.8 C) (09/19 0538) Resp:  [13-19] 13 (09/19 0538) BP: (129)/(89) 129/89 (09/19 0538) SpO2:  [97 %-100 %] 100 % (09/19 0538) FiO2 (%):  [21 %] 21 % (09/18 0834) Weight:  [89.6 kg] 89.6 kg (09/19 0500) Physical Exam: General: NAD Cardiovascular: RRR no m/r/g Respiratory: no iWOB, chest rises symmetrically Abdomen: Nontender, nondistended Extremities: No LE edema on exam  Laboratory: No results for input(s): WBC, HGB, HCT, PLT in the last 168 hours. Recent Labs  Lab 11/28/20 0130 11/28/20 0450 11/29/20 0247 11/30/20 0328  NA 133*  --  134* 131*  K 5.9* 4.9 4.6 5.0  CL 94*  --  94* 94*  CO2 29  --  32 29  BUN 65*  --  57* 56*  CREATININE 2.38*  --  2.04* 2.03*  CALCIUM 8.0*  --  7.7* 8.0*  GLUCOSE 139*  --  111* 126*      Imaging/Diagnostic Tests:   Gerrit Heck, MD 12/01/2020, 6:32 AM PGY-1, [place] Intern pager: [phone], text pages welcome

## 2020-12-02 DIAGNOSIS — U071 COVID-19: Secondary | ICD-10-CM | POA: Diagnosis not present

## 2020-12-02 DIAGNOSIS — I5023 Acute on chronic systolic (congestive) heart failure: Secondary | ICD-10-CM | POA: Diagnosis not present

## 2020-12-02 LAB — BASIC METABOLIC PANEL
Anion gap: 12 (ref 5–15)
Anion gap: 8 (ref 5–15)
BUN: 58 mg/dL — ABNORMAL HIGH (ref 8–23)
BUN: 59 mg/dL — ABNORMAL HIGH (ref 8–23)
CO2: 25 mmol/L (ref 22–32)
CO2: 28 mmol/L (ref 22–32)
Calcium: 8 mg/dL — ABNORMAL LOW (ref 8.9–10.3)
Calcium: 7.9 mg/dL — ABNORMAL LOW (ref 8.9–10.3)
Chloride: 93 mmol/L — ABNORMAL LOW (ref 98–111)
Chloride: 94 mmol/L — ABNORMAL LOW (ref 98–111)
Creatinine, Ser: 1.79 mg/dL — ABNORMAL HIGH (ref 0.61–1.24)
Creatinine, Ser: 2.06 mg/dL — ABNORMAL HIGH (ref 0.61–1.24)
GFR, Estimated: 38 mL/min — ABNORMAL LOW (ref >60)
GFR, Estimated: 32 mL/min — ABNORMAL LOW (ref >60)
Glucose, Bld: 114 mg/dL — ABNORMAL HIGH (ref 70–99)
Glucose, Bld: 93 mg/dL (ref 70–99)
Potassium: 5.7 mmol/L — ABNORMAL HIGH (ref 3.5–5.1)
Potassium: 4.9 mmol/L (ref 3.5–5.1)
Sodium: 130 mmol/L — ABNORMAL LOW (ref 135–145)
Sodium: 130 mmol/L — ABNORMAL LOW (ref 135–145)

## 2020-12-02 LAB — MAGNESIUM: Magnesium: 2.5 mg/dL — ABNORMAL HIGH (ref 1.7–2.4)

## 2020-12-02 LAB — POTASSIUM: Potassium: 5.3 mmol/L — ABNORMAL HIGH (ref 3.5–5.1)

## 2020-12-02 NOTE — Progress Notes (Signed)
FPTS Brief Progress Note  S:Patient awake and comfortable. Repositioned in bed, by nursing.   O: BP (!) 153/118 (BP Location: Right Arm)   Pulse 60   Temp 98 F (36.7 C) (Oral)   Resp 19   Ht 5\' 5"  (1.651 m)   Wt 89.6 kg   SpO2 94%   BMI 32.87 kg/m     A/P: - Orders reviewed. Labs for AM ordered, which was adjusted as needed.    Holley Bouche, MD 12/02/2020, 1:32 AM PGY-1, [facility] Family [facility] Night Resident  Please page [phone] with questions.

## 2020-12-02 NOTE — Progress Notes (Signed)
Occupational Therapy Treatment Patient Details Name: Kevin [name] MRN: [id number] DOB: August 15, 1940 Today's Date: 12/02/2020   History of present illness Pt is an 80 y/o male who presented 12/02/2020 to ED with SOB and hypoxia. Pt found to be COVID+ and with CHF exacerbation. PMH: HTN, HFmrEF, AAA, L AKA (2013), chronic subdural hematoma, CKD3B, wide complex tachycardia, BPH, hx of DVT, HLD, moderate mitral regurgitation.   OT comments  Session focused on trial of sliding board transfer to maximize OOB participation. Overall, pt continues to require extensive assist for all mobility and LB ADLs. Pt Max A for bed mobility, TOTAL A x 2 for sliding board transfer with noted difficulty following directions, problem solving and motor planning. Once in recliner and repositioning for comfort, pt noted with bowel incontinence. Guided pt back into bed via maximove for Total A x 2 peri care. Continue to recommend SNF rehab due to noted endurance and cognitive deficits today impacting progress towards goals. Would like to see improved ability to follow commands and initiate problem solving prior to updating DC recs to CIR.   Recommendations for follow up therapy are one component of a multi-disciplinary discharge planning process, led by the attending physician.  Recommendations may be updated based on patient status, additional functional criteria and insurance authorization.    Follow Up Recommendations  SNF;Supervision/Assistance - 24 hour    Equipment Recommendations  Wheelchair (measurements OT);Wheelchair cushion (measurements OT);Hospital bed (pt reports his wheelchair & cushion are worn out)    Recommendations for Other Services      Precautions / Restrictions Precautions Precautions: Fall;Other (comment) Precaution Comments: L AKA (does not have prosthetic in room); incontinence Restrictions Weight Bearing Restrictions: No       Mobility Bed Mobility Overal bed mobility: Needs  Assistance Bed Mobility: Supine to Sit     Supine to sit: Max assist;HOB elevated;+2 for safety/equipment     General bed mobility comments: Max A for bed mobility to sit EOB, assist to advance R LE, scoot hips and lift trunk. Pt able to hold to bedrail with cues but difficulty initiating assist for task    Transfers Overall transfer level: Needs assistance Equipment used: Ambulation equipment used;Sliding board Transfers: Lateral/Scoot Transfers          Lateral/Scoot Transfers: Total assist;+2 physical assistance;+2 safety/equipment;With slide board;From elevated surface General transfer comment: Overall Total A x 2 for sliding board transfer to chair from elevated bed. Poor direction following noted with heavy forward lean and minimal initiation to assist with task. required frequrnt cues to avoid placing fingers under slide board and unable to successfully lift bottom/push through R LE to assist. Noted with bowel incontinence when attempting reposioning in recliner chair - maximove transfer back to bed for peri care    Balance Overall balance assessment: Needs assistance Sitting-balance support: Feet supported;Bilateral upper extremity supported;Single extremity supported Sitting balance-Leahy Scale: Poor Sitting balance - Comments: perseverating on holding to bedrail with R UE. With increased cues, pt able to place BUE on bed for support. Limited dynamically with balance tasks                                   ADL either performed or assessed with clinical judgement   ADL Overall ADL's : Needs assistance/impaired  Toileting- Clothing Manipulation and Hygiene: Total assistance;Bed level;+2 for physical assistance Toileting - Clothing Manipulation Details (indicate cue type and reason): Total A x 2 for cleanup after bowel incontinence during sliding board transfer. NT/OT maximove transfer pt back to bed for cleanup        General ADL Comments: Session focused on trial of sliding board transfer to increase OOB activity with poor direction following noted and difficulty correcting balance despite multimodal cues     Vision   Vision Assessment?: No apparent visual deficits   Perception     Praxis      Cognition Arousal/Alertness: Awake/alert Behavior During Therapy: WFL for tasks assessed/performed Overall Cognitive Status: No family/caregiver present to determine baseline cognitive functioning                                 General Comments: pt pleasant and cooperative, however increased difficulty with following commands and sequencing activity. Able to follow directions when in bed than when attempting to complete activity. Poor motor planning, initiation and problem solving. Decreased awareness of deficits and some confusion noted (reports need to use bathroom as he had not used it in a while but had just been cleaned up by OT/NT after bowel incontinence)        Exercises     Shoulder Instructions       General Comments VSS on RA    Pertinent Vitals/ Pain       Pain Assessment: No/denies pain Pain Intervention(s): Monitored during session  Home Living                                          Prior Functioning/Environment              Frequency  Min 2X/week        Progress Toward Goals  OT Goals(current goals can now be found in the care plan section)  Progress towards OT goals: OT to reassess next treatment  Acute Rehab OT Goals Patient Stated Goal: go home OT Goal Formulation: With patient Time For Goal Achievement: 12/08/20 Potential to Achieve Goals: Good ADL Goals Pt Will Perform Grooming: with modified independence;sitting Pt Will Perform Lower Body Bathing: with min assist;sitting/lateral leans Pt Will Transfer to Toilet: with mod assist;squat pivot transfer;bedside commode Pt Will Perform Toileting - Clothing Manipulation and  hygiene: with min assist;sitting/lateral leans  Plan Discharge plan remains appropriate    Co-evaluation                 AM-PAC OT "6 Clicks" Daily Activity     Outcome Measure   Help from another person eating meals?: A Little Help from another person taking care of personal grooming?: A Little Help from another person toileting, which includes using toliet, bedpan, or urinal?: Total Help from another person bathing (including washing, rinsing, drying)?: A Lot Help from another person to put on and taking off regular upper body clothing?: A Little Help from another person to put on and taking off regular lower body clothing?: Total 6 Click Score: 13    End of Session Equipment Utilized During Treatment: Gait belt  OT Visit Diagnosis: Unsteadiness on feet (R26.81);Other abnormalities of gait and mobility (R26.89);Muscle weakness (generalized) (M62.81)   Activity Tolerance Patient limited by fatigue;Patient tolerated treatment well   Patient Left in bed;with call  bell/phone within reach;with bed alarm set;with nursing/sitter in room   Nurse Communication Mobility status;Need for lift equipment        Time: 1030-1111 OT Time Calculation (min): 41 min  Charges: OT General Charges $OT Visit: 1 Visit OT Treatments $Self Care/Home Management : 8-22 mins $Therapeutic Activity: 23-37 mins  Malachy Chamber, OTR/L Acute Rehab Services Office: [phone]   Layla Maw 12/02/2020, 12:42 PM

## 2020-12-02 NOTE — TOC Progression Note (Addendum)
Transition of Care [facility]) - Progression Note    Patient Details  Name: Kevin [name] MRN: [id number] Date of Birth: 01-14-1941  Transition of Care [facility]) CM/SW [place], [place] Phone Number: 12/02/2020, 2:49 PM  Clinical Narrative:     Update 4:36pm- CSW unable to reach  patients son . CSW tried to call patients son again to discuss SNF bed offers. CSW will try to call again in the am.  CSW spoke with patient at bedside regarding SNF bed offers. Patient requested for CSW to call his son [name] to help with SNF choice. CSW called patients son [name] and left voicemail. CSW awaiting callback to discuss SNF bed offers. CSW will continue to follow and assist with dc planning needs.  Expected Discharge Plan: [place] Barriers to Discharge: Continued Medical Work up  Expected Discharge Plan and Services Expected Discharge Plan: [place] In-house Referral: Clinical Social Work                                             Social Determinants of Health (SDOH) Interventions    Readmission Risk Interventions No flowsheet data found.

## 2020-12-02 NOTE — Progress Notes (Signed)
FPTS Brief Progress Note  S:Patient sleeping comfortably in bed.    O: BP 115/87 (BP Location: Right Arm)   Pulse 60   Temp 98.3 F (36.8 C) (Oral)   Resp 17   Ht 5\' 5"  (1.651 m)   Wt 84.4 kg   SpO2 98%   BMI 30.96 kg/m     A/P:  - Orders reviewed. Labs for AM ordered, which was adjusted as needed.    Holley Bouche, MD 12/02/2020, 11:33 PM PGY-1, [place] Night Resident  Please page [phone] with questions.

## 2020-12-02 NOTE — Progress Notes (Signed)
Family Medicine Teaching Service Daily Progress Note Intern Pager: [phone]  Patient name: CECILIO [name] Medical record number: [id number] Date of birth: [birth year] Age: 80 y.o. Gender: male  Primary Care Provider: Daphene Jaeger, PA-C Consultants: Cardiology Code Status: Full  Pt Overview and Major Events to Date:  9/11 admitted with thoracentesis performed 9/12 echo with EF 20-25% with moderate MR  Assessment and Plan:  80 year old male presenting with SOB and hypoxia. Pmh significant for HTN, HFrEF, AAA, chronic subdural hematoma, CKD3B, wide complex tachycardia, BPH, DVT hx, hyperlipidemia, moderate MR  Hypoxic respiratory failure in the setting of HFrEF 20-25% on echo and MR Similar breathing, No peripheral edema. Dry weight likely 199 however may be lower. 186.07 from 197.53 weight yesterday likely innaccurate. UOP 2.35, net down 10.7 L -cardiology follwing appreciate recs  -lasix 40 mg daily (per cards can consider higher daily dose) -o2  sats 88-92% -strict I's and Os -daily weight -albuterol inhaler -ellipta inhaler -duneb prn -outpatient management MR  Hyperkalemia Just has some PVCs, and non sustained VT on telemetry. 5.7 to 5.3 -repeat BMP at 1500  AKI on CKD3b Creatinine today 1.79 from 1.92 yesterday. -encourage po intake   Pleural effusions s/p thoracentesis -pulm f/u outpatient -supplemental o2 prn  COVID-19 Has been afebrile -decadron completed -O2 -incentive spirometry -completed remdesivir and antibiotic course for possible superimposed infection -Contact infection control for precautions  Constipation/acid reflux -miralax -pepcid  Delirium No calls ON -delirium precautions  BPH  -flomax 0.4 mg -encourage urinal use  HTN BP has been 140s-150s/100s this AM Home coreg  All other problems stable  FEN/GI: heart PPx: lovenox Dispo: Medically clear, CIR consult in, PT to evaluate  Subjective:  High K on repeat, no complaints  this AM, breathing fine. Will repeat. Weight down  Objective: Temp:  [97.7 F (36.5 C)-98.2 F (36.8 C)] 98 F (36.7 C) (09/20 0500) Pulse Rate:  [42-60] 58 (09/20 0500) Resp:  [13-24] 18 (09/20 0500) BP: (113-153)/(83-118) 142/113 (09/20 0500) SpO2:  [94 %-100 %] 96 % (09/20 0500) Weight:  [84.4 kg] 84.4 kg (09/20 0500) Physical Exam: General: nad Cardiovascular: rrr no m/r/g Respiratory: no iwob Abdomen: nondistended, nontender Extremities: no le edema  Laboratory: No results for input(s): WBC, HGB, HCT, PLT in the last 168 hours. Recent Labs  Lab 11/29/20 0247 11/30/20 0328 12/01/20 0532  NA 134* 131* 132*  K 4.6 5.0 4.9  CL 94* 94* 95*  CO2 32 29 29  BUN 57* 56* 52*  CREATININE 2.04* 2.03* 1.92*  CALCIUM 7.7* 8.0* 7.9*  GLUCOSE 111* 126* 112*     Imaging/Diagnostic Tests:   Gerrit Heck, MD 12/02/2020, 5:29 AM PGY-1, [place] Intern pager: [phone], text pages welcome

## 2020-12-02 NOTE — Care Management Important Message (Signed)
Important Message  Patient Details  Name: Kevin [name] MRN: [id number] Date of Birth: [birth year]   Medicare Important Message Given:  Yes     Yaelis Scharfenberg [address] 12/02/2020, 3:58 PM

## 2020-12-02 NOTE — Progress Notes (Signed)
Inpatient Rehab Admissions Coordinator:   Pt. Is total assist with transfers at this time with PT recommending SNF. I am in agreement that Pt. Is more appropriate for SNF as I believe he would struggle to participate in 3 hours of daily therapy.   Clemens Catholic, [place], [place] Admissions Coordinator  [phone] ([place]) [phone] (office)

## 2020-12-02 NOTE — Progress Notes (Signed)
Progress Note  Patient Name: Kevin [name] Date of Encounter: 12/02/2020  [facility] Cardiologist: Jenkins Rouge, MD   Subjective   No significant overnight events. Patient now off COVID precautions. He is doing well this morning. Breathing back to baseline. Off supplemental O2. No chest pain or palpitations. Very weak. Plan is for discharge to CIR.  Inpatient Medications    Scheduled Meds:  albuterol  2 puff Inhalation BID   atorvastatin  40 mg Oral Daily   carvedilol  6.25 mg Oral BID WC   enoxaparin (LOVENOX) injection  30 mg Subcutaneous Daily   famotidine  10 mg Oral Daily   furosemide  40 mg Oral Daily   melatonin  3 mg Oral QHS   polyethylene glycol  17 g Oral Daily   tamsulosin  0.4 mg Oral QPC supper   umeclidinium bromide  1 puff Inhalation Daily   Continuous Infusions:  PRN Meds: acetaminophen, ipratropium-albuterol, ondansetron (ZOFRAN) IV, oxyCODONE-acetaminophen   Vital Signs    Vitals:   12/01/20 1334 12/01/20 1346 12/01/20 2041 12/02/20 0500  BP:  113/83 (!) 153/118 (!) 142/113  Pulse:  (!) 42 60 (!) 58  Resp:  18 19 18   Temp: 97.7 F (36.5 C) 98 F (36.7 C) 98 F (36.7 C) 98 F (36.7 C)  TempSrc: Oral Oral Oral Oral  SpO2:   94% 96%  Weight:    84.4 kg  Height:        Intake/Output Summary (Last 24 hours) at 12/02/2020 0816 Last data filed at 12/02/2020 0500 Gross per 24 hour  Intake 360 ml  Output 2350 ml  Net -1990 ml   Last 3 Weights 12/02/2020 12/01/2020 11/30/2020  Weight (lbs) 186 lb 1.1 oz 197 lb 8.5 oz 191 lb  Weight (kg) 84.4 kg 89.6 kg 86.637 kg      Telemetry    Sinus rhythm with rates in the 50s to 60s. PVC and short runs of NSVT noted. Also 25 beat run of a idioventricular rhythm noted with rates in the 60s to 70s. - Personally Reviewed  ECG    No new ECG tracing since 11/23/2020. - Personally Reviewed  Physical Exam   GEN: No acute distress.   Neck: No JVD. Cardiac: RRR. No murmurs, rubs, or gallops.   Respiratory: No increased work of breathing. Mild crackles in bases (possible atelectasis) and mild rhonchi (but may be more upper airway noises). No wheezes.  GI: Soft, non-distended, and non-tender.  MS: No edema of right leg. S/p left AKA. Skin: Extremities slightly cool to the touch but dry. Neuro:  No focal deficits. Psych: Normal affect.  Labs    High Sensitivity Troponin:   Recent Labs  Lab 12/04/2020 1821 11/23/20 0134 11/23/20 0431 11/23/20 1218 11/23/20 1744  TROPONINIHS 94* 91* 132* 112* 118*     Chemistry Recent Labs  Lab 11/26/20 0301 11/27/20 0311 11/28/20 0130 11/30/20 0328 12/01/20 0532 12/02/20 0519  NA 133* 133*   < > 131* 132* 130*  K 4.3 4.5   < > 5.0 4.9 5.7*  CL 94* 96*   < > 94* 95* 93*  CO2 25 29   < > 29 29 25   GLUCOSE 120* 126*   < > 126* 112* 114*  BUN 57* 64*   < > 56* 52* 58*  CREATININE 2.44* 2.33*   < > 2.03* 1.92* 1.79*  CALCIUM 8.0* 7.7*   < > 8.0* 7.9* 8.0*  MG 2.2  --   --   --   --   --  ALBUMIN  --  2.4*  --   --   --   --   GFRNONAA 26* 28*   < > 33* 35* 38*  ANIONGAP 14 8   < > 8 8 12    < > = values in this interval not displayed.    Lipids No results for input(s): CHOL, TRIG, HDL, LABVLDL, LDLCALC, CHOLHDL in the last 168 hours.  HematologyNo results for input(s): WBC, RBC, HGB, HCT, MCV, MCH, MCHC, RDW, PLT in the last 168 hours. Thyroid No results for input(s): TSH, FREET4 in the last 168 hours.  BNPNo results for input(s): BNP, PROBNP in the last 168 hours.  DDimer No results for input(s): DDIMER in the last 168 hours.   Radiology    No results found.  Cardiac Studies   Echocardiogram 11/23/2020: Impressions: 1. MR not well interrogated but may be severe; suggest TEE to further  assess if clinically indicated.   2. Left ventricular ejection fraction, by estimation, is 20 to 25%. The  left ventricle has severely decreased function. The left ventricle  demonstrates global hypokinesis. The left ventricular internal  cavity size  was severely dilated. There is mild  left ventricular hypertrophy. Left ventricular diastolic parameters are  consistent with Grade II diastolic dysfunction (pseudonormalization).   3. Right ventricular systolic function is normal. The right ventricular  size is normal.   4. Left atrial size was mildly dilated.   5. The mitral valve is normal in structure. Moderate to severe mitral  valve regurgitation. No evidence of mitral stenosis.   6. Tricuspid valve regurgitation is moderate.   7. The aortic valve is normal in structure. Aortic valve regurgitation is  not visualized. No aortic stenosis is present.   8. The inferior vena cava is normal in size with greater than 50%  respiratory variability, suggesting right atrial pressure of 3 mmHg.   Patient Profile     80 y.o. male with a history of moderate CAD on cath in 2007, chronic combined CHF with EF of 20-25%, mitral regurgitation, AAA measuring 6.2 cm in 08/2020, hypertension, hyperlipidemia, COPD, gangrene of left leg s/p AKA in 2013, prior PE/DVT s/p IVC filter, CKD stage III, and tobacco abuse who presented on 12/05/2020 with acute hypoxemic respiratory failure in the setting of pleural effusion, compressive atelectasis, and COVID-19 with bacterial pneumonia who is being followed by Cardiology for evaluation of acute on chronic CHF.  Assessment & Plan    Acute Hypoxic Respiratory Failure - Presented with shortness of breath. Bilateral pleural effusions noted on chest x-ray. Chest CTA showed no PE but did note occlusion of right bronchus intermedius and right middle/lower lobe bronchi. Also found to have COVID-19 with bacterial pneumonia. - S/p right thoracentesis  on 11/23/2020 with 1400cc fluid removed. - Completed course of IV antibiotics and Remdesivir. - See recommendations for CHF below. - Otherwise, per primary team.   Acute on Chronic Combined CHF - Longstanding history of CHF dating back to 2007 with EF of 20%. -  Echo this admission showed LVEF of 20-25% with severe LV dilation and global hypokinesis, mild LVH, grade 2 diastolic dysfunction, mild left atrial enlargement, and moderate to severe MR.  - Initially diuresed with IV Lasix but stopped on 9/15. Restarted on PO Lasix yesterday as weight started to increase. Documented 2.35 L of urinary output yesterday. Net negative 10.7 L this admission. Weight 186 lbs today, down from 197 lbs yesterday (unsure if this is accurate). Renal function slowly improving.  - Continue PO  Lasix 40mg  daily. - Continue Coreg 6.25mg  twice daily. - No ACE/ARB/ARNI or MRA due to renal function. - Continue to monitor daily weights, strict I/Os, and renal function.   Elevated Troponin History of CAD - History of moderate CAD noted on cath in 2007. - High-sensitivity troponin peaked at 132 and trended down. - EKG with chronic LBB. - Echo as above. - No angina. - Not consistent with ACS. Troponin elevation felt to be secondary to demand ischemia. Can discuss outpatient ischemic evaluation after recovery from present illness.   Mitral Regurgitation - Moderate to severe MR noted on Echo this admission.  - Felt to be somewhat functional in the setting of CHF exacerbation and COVID-19. - Will need ongoing outpatient monitoring.   AAA - CTA in 08/2020 showed AAA measuring 6.2cm.  - Seen by Vascular Surgery and outpatient follow-up recommended for EVAR consideration.  Frequent PVCs Non-Sustained VT - Patient continues to have ferquent PVCs and short runs of non-sustained VT. Also had about a 25 beat run of idioventricular rhythm with rates in the 60s to 70s. Baseline rates in the 50s to 60s.  - Potassium slightly high today as below. - Magnesium 2.2 on 9/14. Will recheck.  - Continue Coreg as above.   Hypertension - BP well controlled. - Continue Coreg as above.   Hyperlipidemia - Continue Lipitor 40mg  daily.   CKD Stage III-IV - Creatinine peaked at 2.44 on 9/14.  Slowly improving to 1.79 today. Baseline 1.7 to 2.2. - Continue to avoid nephrotoxic agents. - Continue daily BMET.  Hyperkalemia - Potassium 5.7 this morning on BMET and then 5.3 on repeat labs. - Not on any medications that cause hyperkalemia. - Management per primary team.  For questions or updates, please contact [place] Please consult [email or web address] for contact info under        Signed, Darreld Mclean, PA-C  12/02/2020, 8:16 AM

## 2020-12-03 ENCOUNTER — Other Ambulatory Visit: Payer: Self-pay

## 2020-12-03 DIAGNOSIS — I5023 Acute on chronic systolic (congestive) heart failure: Secondary | ICD-10-CM | POA: Diagnosis not present

## 2020-12-03 DIAGNOSIS — L03115 Cellulitis of right lower limb: Secondary | ICD-10-CM

## 2020-12-03 DIAGNOSIS — U071 COVID-19: Secondary | ICD-10-CM | POA: Diagnosis not present

## 2020-12-03 LAB — BASIC METABOLIC PANEL
Anion gap: 13 (ref 5–15)
BUN: 61 mg/dL — ABNORMAL HIGH (ref 8–23)
CO2: 23 mmol/L (ref 22–32)
Calcium: 8.1 mg/dL — ABNORMAL LOW (ref 8.9–10.3)
Chloride: 96 mmol/L — ABNORMAL LOW (ref 98–111)
Creatinine, Ser: 1.95 mg/dL — ABNORMAL HIGH (ref 0.61–1.24)
GFR, Estimated: 34 mL/min — ABNORMAL LOW (ref >60)
Glucose, Bld: 75 mg/dL (ref 70–99)
Potassium: 4.9 mmol/L (ref 3.5–5.1)
Sodium: 132 mmol/L — ABNORMAL LOW (ref 135–145)

## 2020-12-03 MED ORDER — FUROSEMIDE 20 MG PO TABS: 20.0000 mg | ORAL_TABLET | Freq: Every day | ORAL | Status: DC

## 2020-12-09 ENCOUNTER — Encounter: Payer: Medicaid Other | Admitting: Vascular Surgery

## 2020-12-11 MED FILL — Medication: Qty: 1 | Status: AC

## 2020-12-13 NOTE — Death Summary Note (Addendum)
[facility] Death Summary  Patient name: Kevin [name] Medical record number: [id number] Date of birth: [birth year] Age: 80 y.o. Gender: male Date of Admission: December 18, 2020  Date of Death: Dec 29, 2020 Admitting Physician: Leeanne Rio, MD  Primary Care Provider: Daphene Jaeger, PA-C Consultants: Cardiology and Pulmonology   Indication for Hospitalization: Acute hypoxic respiratory failure   Discharge Diagnoses/Problem List:  Sudden Cardiac Death  Active Problems:   Acute exacerbation of congestive heart failure (HCC)   Acute exacerbation of CHF (congestive heart failure) ([place])   Acute respiratory failure with hypoxia ([place])   COVID   Disposition: Death  Brief Hospital Course:  Hospital course of 80 year old [name] who presented to the ED with shortness of breath and hypoxia.  Past medical history significant for hypertension, HFrEF, AAA, chronic subdural hematoma, CKD 3B, wide-complex tachycardia, BPH, history of DVT hyperlipidemia, mitral regurgitation.  Hypoxic respiratory failure with  HFrEF with a EF of 20 to 25%  Patient presented to the ED with shortness of breath while at home.  He had a chronic cough that worsened in the past few days.  BNP on admission was greater than 1300.  Shortness of breath improved with nebulizer treatments and supplemental oxygen.   He denied any fevers sore throat, muscle pain. Has not needed any oxygen at home prior 2 L in ED. Repeat echo showed EF of 20 to 25% moderate to severe mitral regurgitation.  He was given Lasix per cardiology for diuresis.  Carvedilol was continued.  Electrolytes were repleted and maintained with potassium greater than 4 and magnesium greater than 2. Initial weight was 199.74 pounds. At end of hospitalization weight was 183.64 pounds. He was diuresed a total of 11.9 liters.  Pleural effusions, moderate  occlusion of bronchus Noted on x-ray and chest CTA.  Which was larger on the  right side than left.  There was high-grade narrowing occlusion of the bronchus intermedius and right middle and lower lobe bronchi concerning for aspiration versus endobronchial lesion versus right hilar mass. Pulmonology was consulted and recommended thoracentesis which was performed on 9/11 with 1400 cc of serosanguineous fluid removed, and cytology that showed transudative fluid likely related to heart failure.  They recommended ceftriaxone and azithromycin to cover bacterial coinfection alongside COVID which was completed in the hospital.    COVID-19 Completed remdesivir course in hospital with Decadron 6 mg orally daily for 10 days.   Elevated Troponins  Trended 101>94>91>132>112.  Was unlikely to be ACS, had no chest pain on admission, and has chronic left BPPV with PVCs.  Cardiology saw and believed it to be suspected demand ischemia in the setting of acute hypoxic respiratory failure/CHF exacerbation.    Mitral regurgitation Believed to be moderate to severe on echo during this admission. Did not require TEE to evaluate per cardiology and was felt to be mostly functional in the setting of CHF exacerbation and COVID-19 illness.   Wide-complex tachycardia EKG consistent with this on this admission.  Patient did not have chest pain.  He did have intermittent palpitations.  This is a chronic issue LBBB and Coreg was continued.  History of DVTs with IVC filter D-dimer 6.66 on admission.  CTA negative for PE.  DVT ultrasound of right lower extremity and femoral vein on left AKA was negative.    Sudden Cardiac Death Likely 2/2 Arrhythmia  There was a code blue called for the patient on December 29, 2020. Likely cause was sudden cardiac arrest 2/2 arrhythmia. Primary team arrived to bedside  once the code was called. CPR was initiated and ultimately, we were unable to achieve ROSC. After more than 20 minutes of intervention, time of death was called.     Significant Procedures:  Thoracentesis on 9/11 as  noted above.   Significant Labs and Imaging:   Results for Kevin, [name] (MRN [id number]) as of 2020/12/14 16:24  Ref. Range 2020-12-14 68:37  BASIC METABOLIC PANEL Unknown Rpt (A)  Sodium Latest Ref Range: 135 - 145 mmol/L 132 (L)  Potassium Latest Ref Range: 3.5 - 5.1 mmol/L 4.9  Chloride Latest Ref Range: 98 - 111 mmol/L 96 (L)  CO2 Latest Ref Range: 22 - 32 mmol/L 23  Glucose Latest Ref Range: 70 - 99 mg/dL 75  BUN Latest Ref Range: 8 - 23 mg/dL 61 (H)  Creatinine Latest Ref Range: 0.61 - 1.24 mg/dL 1.95 (H)  Calcium Latest Ref Range: 8.9 - 10.3 mg/dL 8.1 (L)  Anion gap Latest Ref Range: 5 - 15  13  GFR, Estimated Latest Ref Range: >60 mL/min 34 (L)   Chest x-ray 11/23/20:   IMPRESSION: Improving changes of congestive heart failure.   CT abd pelvis 11/27/2020: IMPRESSION: 1. Very limited study due to suboptimal opacification and timing of the contrast. No definite large or central pulmonary artery embolus identified. 2. Moderate right and small left pleural effusions with compressive atelectasis of the majority of the right lower lobe and partial compressive atelectasis of the right middle lobe and left lower lobe. Pneumonia is not excluded. 3. High-grade narrowing an occlusion of the bronchus intermedius and right middle and right lower lobe bronchi. Aspiration or an endobronchial lesion or a right hilar mass are not excluded. 4. Cholelithiasis. 5. Sigmoid diverticulosis. No bowel obstruction. Normal appendix. 6. Partially thrombosed fusiform infrarenal abdominal aortic aneurysm measuring up to 6 cm in diameter similar to prior CT. Follow-up as per recommendation of prior CT. 7. Aortic Atherosclerosis (ICD10-I70.0).  CT angio chest 12/10/2020:  IMPRESSION: 1. Very limited study due to suboptimal opacification and timing of the contrast. No definite large or central pulmonary artery embolus identified. 2. Moderate right and small left pleural effusions with  compressive atelectasis of the majority of the right lower lobe and partial compressive atelectasis of the right middle lobe and left lower lobe. Pneumonia is not excluded. 3. High-grade narrowing an occlusion of the bronchus intermedius and right middle and right lower lobe bronchi. Aspiration or an endobronchial lesion or a right hilar mass are not excluded. 4. Cholelithiasis. 5. Sigmoid diverticulosis. No bowel obstruction. Normal appendix. 6. Partially thrombosed fusiform infrarenal abdominal aortic aneurysm measuring up to 6 cm in diameter similar to prior CT. Follow-up as per recommendation of prior CT. 7. Aortic Atherosclerosis (ICD10-I70.0).  Chest x-ray 11/20/2020:   IMPRESSION: 1. Possible enlargement of the cardiac silhouette. 2. Bilateral pleural effusions. 3. Probable mild interstitial pulmonary edema.     Erskine Emery, MD 12/14/20, 4:28 PM PGY-1 [place]  I was personally present and performed or re-performed the history, physical exam and medical decision making activities of this service and have verified that the service and findings are accurately documented in the resident's note. My edits are noted within the note as appropriate. Please also see attending's attestation and notes.  Donney Dice, DO                  12/14/20, 5:18 PM  PGY-2, [place]

## 2020-12-13 NOTE — Progress Notes (Signed)
Code blue called for the patient. Attempted to call the patient's emergency contact Leilani Able at [phone]. I have also called the other contacts available in his chart. Including Lovenia Shuck (sister), Janeice Robinson (daughter), and Shanon Rosser (Daughter). I have left voicemails to two of the contacts as the other contacts do not have voicemail set up yet. Will continue trying to reach the family.

## 2020-12-13 NOTE — TOC Progression Note (Signed)
Transition of Care [facility] [address] [address]) - Progression Note    Patient Details  Name: Kevin [name] MRN: [id number] Date of Birth: 30-May-1940  Transition of Care [facility]) CM/SW [place], [place] Phone Number: 2020-12-18, 9:45 AM  Clinical Narrative:     CSW received call from patients son Kevin [name]. CSW provided patients son Kevin [name] with SNF bed offers. Kevin [name] chose SNF placement at [facility] place for patient. CSW spoke with Kevin [name] with [facility] place who confirmed she can accept patient for SNF placement. Kevin [name] confirmed with CSW she will start insurance authorization. CSW will continue to follow and assist with dc planning needs.  Patient has SNF bed at [facility]. Insurance authorization is pending.  Expected Discharge Plan: [place] Barriers to Discharge: Continued Medical Work up  Expected Discharge Plan and Services Expected Discharge Plan: [place] In-house Referral: Clinical Social Work                                             Social Determinants of Health (SDOH) Interventions    Readmission Risk Interventions No flowsheet data found.

## 2020-12-13 NOTE — Progress Notes (Addendum)
FPTS Interim Progress Note  Notified that family present. Went with Dr. Zigmund Daniel and Dr. Jinny Sanders to talk with sister, Jackelyn Poling, who was present in the waiting area. Proceeded to discuss the brief events of this hospital course and that patient was medically stable awaiting discharge to either home or SNF today. Shared with Jackelyn Poling that code was called and Mr. Umar did not make it unfortunately due to sudden cardiac event likely secondary to PEA due to patient's extensive cardiac history. Provided support, also offered chaplain services but patient politely declined. Debbie welcomes future contact if needed.   Donney Dice, DO December 24, 2020, 5:22 PM PGY-2, [facility] pager [phone]

## 2020-12-13 NOTE — Progress Notes (Signed)
This chaplain responded to Pt. Code Blue with the medical team.  The chaplain understands the Pt. did not survive the Code Blue and attempts were unsuccessful in contacting the Pt. family.    The chaplain followed up with staff care and will ask spiritual care to visit this evening.

## 2020-12-13 NOTE — Progress Notes (Signed)
Physical Therapy Treatment Patient Details Name: Kevin [name] MRN: [id number] DOB: 12/08/1940 Today's Date: 12-07-20   History of Present Illness Pt is an 80 y/o male who presented 11/21/2020 to ED with SOB and hypoxia. Pt found to be COVID+ and with CHF exacerbation. PMH: HTN, HFmrEF, AAA, L AKA (2013), chronic subdural hematoma, CKD3B, wide complex tachycardia, BPH, hx of DVT, HLD, moderate mitral regurgitation.    PT Comments    Pt with limited participation with therapy, difficult to determine why. Pt found to be incontinent of stool and urine. Pt requires maximal cuing for hand placement and maxA for rolling for pericare and lift pad placement. Pt with decreased muscle activation to assist in sitting up in chair once Maximove used to transfer to chair. CIR has refused pt and d/c plans have returned to SNF. Frequency has been adjusted accordingly. PT will continue to see acutely.     Recommendations for follow up therapy are one component of a multi-disciplinary discharge planning process, led by the attending physician.  Recommendations may be updated based on patient status, additional functional criteria and insurance authorization.  Follow Up Recommendations  SNF           Precautions / Restrictions Precautions Precautions: Fall;Other (comment) Precaution Comments: L AKA (does not have prosthetic in room); incontinence Restrictions Weight Bearing Restrictions: No     Mobility  Bed Mobility Overal bed mobility: Needs Assistance Bed Mobility: Supine to Sit Rolling: Max assist   Supine to sit: Max assist;HOB elevated;+2 for safety/equipment     General bed mobility comments: maxA for rolling, found to be incontinent of stool, cleaned and lift pad placed    Transfers Overall transfer level: Needs assistance Equipment used: Ambulation equipment used;Sliding board            Lateral/Scoot Transfers: Total assist;+2 physical assistance;+2 safety/equipment;With  slide board;From elevated surface General transfer comment: totalAx2 to Maximove to recliner                  Balance Overall balance assessment: Needs assistance Sitting-balance support: Feet supported;Bilateral upper extremity supported;Single extremity supported Sitting balance-Leahy Scale: Poor Sitting balance - Comments: no attempt to engage core muscle to sit up                                    Cognition Arousal/Alertness: Awake/alert Behavior During Therapy: WFL for tasks assessed/performed Overall Cognitive Status: No family/caregiver present to determine baseline cognitive functioning                                 General Comments: continues to have declining response to commands and sequencing         General Comments General comments (skin integrity, edema, etc.): VSS on RA      Pertinent Vitals/Pain Pain Assessment: No/denies pain     PT Goals (current goals can now be found in the care plan section) Acute Rehab PT Goals Patient Stated Goal: go home PT Goal Formulation: With patient Time For Goal Achievement: 12/08/20 Potential to Achieve Goals: Poor Progress towards PT goals: Not progressing toward goals - comment (little to no engagement with therapy today)    Frequency    Min 2X/week      PT Plan Discharge plan needs to be updated       AM-PAC PT "6 Clicks" Mobility  Outcome Measure  Help needed turning from your back to your side while in a flat bed without using bedrails?: A Lot Help needed moving from lying on your back to sitting on the side of a flat bed without using bedrails?: Total Help needed moving to and from a bed to a chair (including a wheelchair)?: Total Help needed standing up from a chair using your arms (e.g., wheelchair or bedside chair)?: Total Help needed to walk in hospital room?: Total Help needed climbing 3-5 steps with a railing? : Total 6 Click Score: 7    End of Session    Activity Tolerance: Other (comment) (limited by decreased participation) Patient left: in chair;with call bell/phone within reach;with chair alarm set Nurse Communication: Mobility status;Need for lift equipment PT Visit Diagnosis: Muscle weakness (generalized) (M62.81)     Time: 7129-2909 PT Time Calculation (min) (ACUTE ONLY): 37 min  Charges:  $Therapeutic Activity: 23-37 mins                     [name] B. Migdalia Dk PT, DPT Acute Rehabilitation Services Pager [phone] Office [phone]    [place] December 26, 2020, 1:12 PM

## 2020-12-13 NOTE — Procedures (Signed)
Intubation Procedure Note  Kevin [name]  [id number]  03-Sep-1940  Date:2020-12-08  Time:4:14 PM   Provider Performing:Mumin Denomme, Otho Ket T    Procedure: Intubation (22575)  Indication(s) Respiratory Failure  Consent Unable to obtain consent due to emergent nature of procedure.   Anesthesia none   Time Out Verified patient identification, verified procedure, site/side was marked, verified correct patient position, special equipment/implants available, medications/allergies/relevant history reviewed, required imaging and test results available.   Sterile Technique Usual hand hygeine, masks, and gloves were used   Procedure Description Patient positioned in bed supine.  Sedation given as noted above.  Patient was intubated with endotracheal tube using  direct .  View was Grade 2 only posterior commissure .  Number of attempts was 1.  Colorimetric CO2 detector was consistent with tracheal placement.   Complications/Tolerance None; patient tolerated the procedure well. Chest X-ray is ordered to verify placement.   EBL Minimal   Specimen(s) None

## 2020-12-13 DEATH — deceased

## 2020-12-29 ENCOUNTER — Inpatient Hospital Stay: Payer: Medicaid Other | Admitting: Pulmonary Disease

## 2021-01-07 ENCOUNTER — Ambulatory Visit: Payer: Medicaid Other | Admitting: Physician Assistant

## 2022-06-10 IMAGING — CR DG FOOT COMPLETE 3+V*R*
3 series · 3 of 3 positions shown · non-contrast
Comparison: None.

CLINICAL DATA: Pain swelling

EXAM:
RIGHT FOOT COMPLETE - 3+ VIEW

[foot ap]
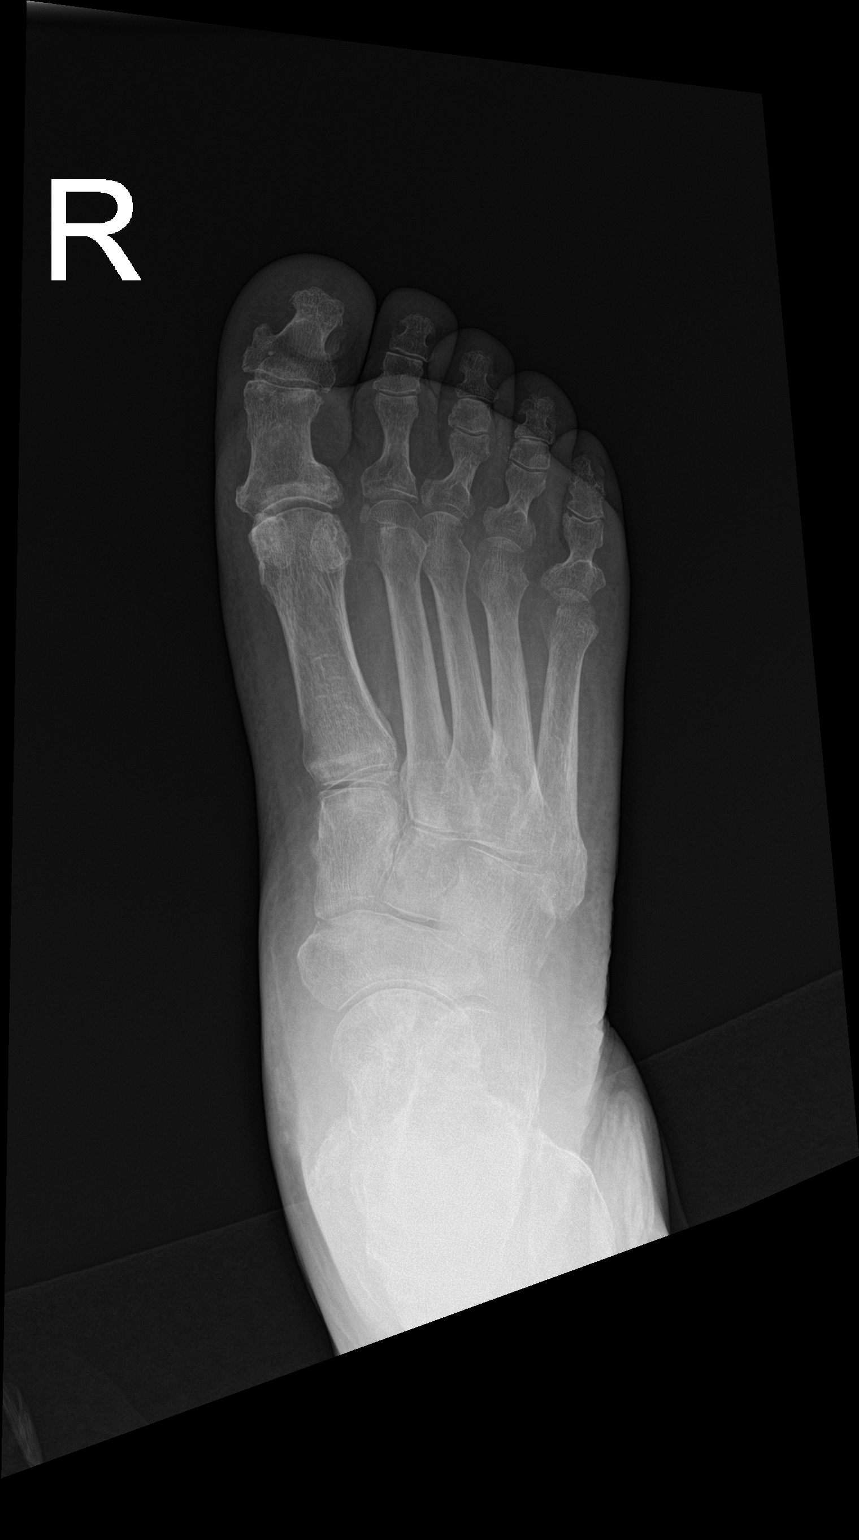

[foot obl]
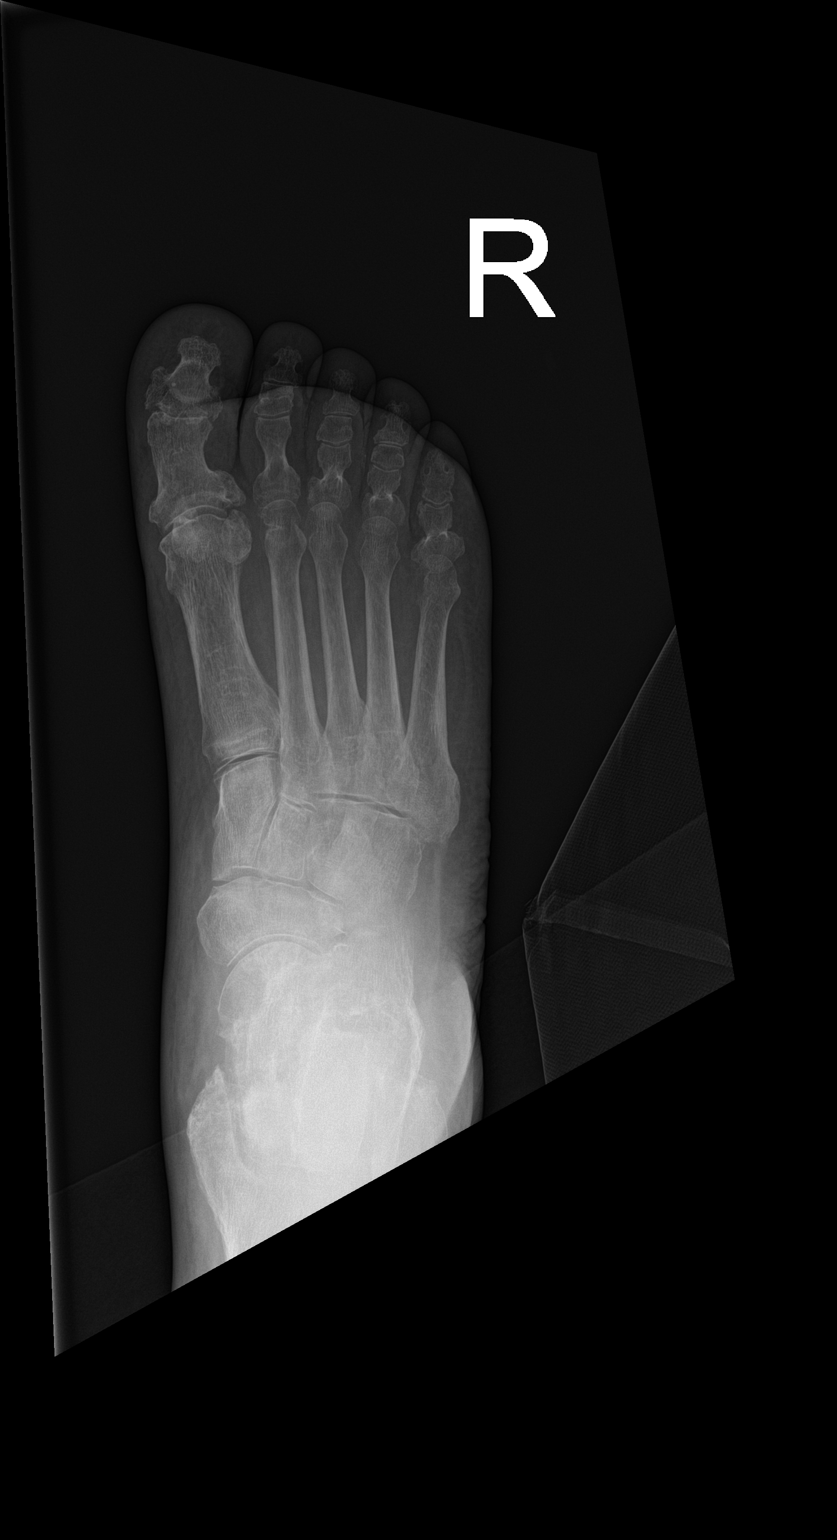

[foot lat]
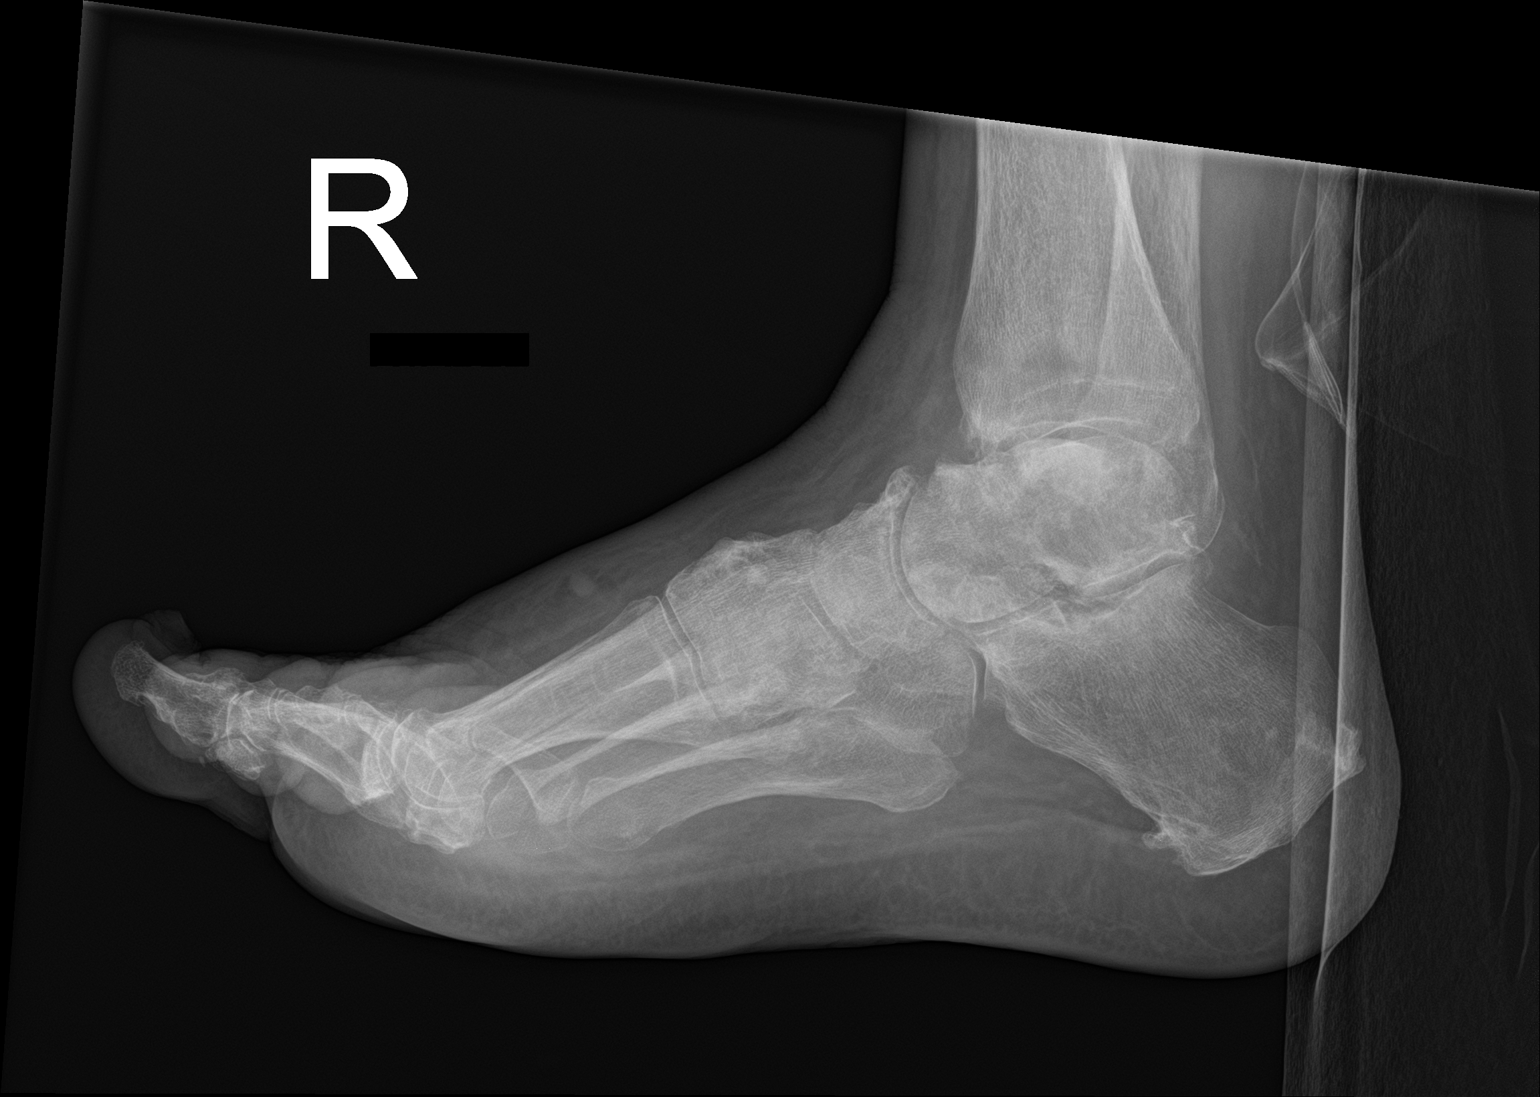

[3 of 3 positions shown; findings below may reference images not displayed]

FINDINGS: There is soft tissue swelling which is nonspecific. There are
degenerative changes of the first metatarsophalangeal joint. There
is no acute displaced fracture. There are advanced degenerative
changes of the ankle mortise. There are degenerative changes of the
midfoot. There is a small plantar calcaneal spur.
IMPRESSION: 1. No acute displaced fracture or dislocation.
2. Degenerative changes as above.
3. Nonspecific soft tissue swelling about the foot.

## 2022-06-10 IMAGING — CR DG TIBIA/FIBULA 2V*R*
3 series · 3 of 3 positions shown · non-contrast
Comparison: None.

CLINICAL DATA: Leg pain and swelling.

EXAM:
RIGHT TIBIA AND FIBULA - 2 VIEW

[tibia ap]
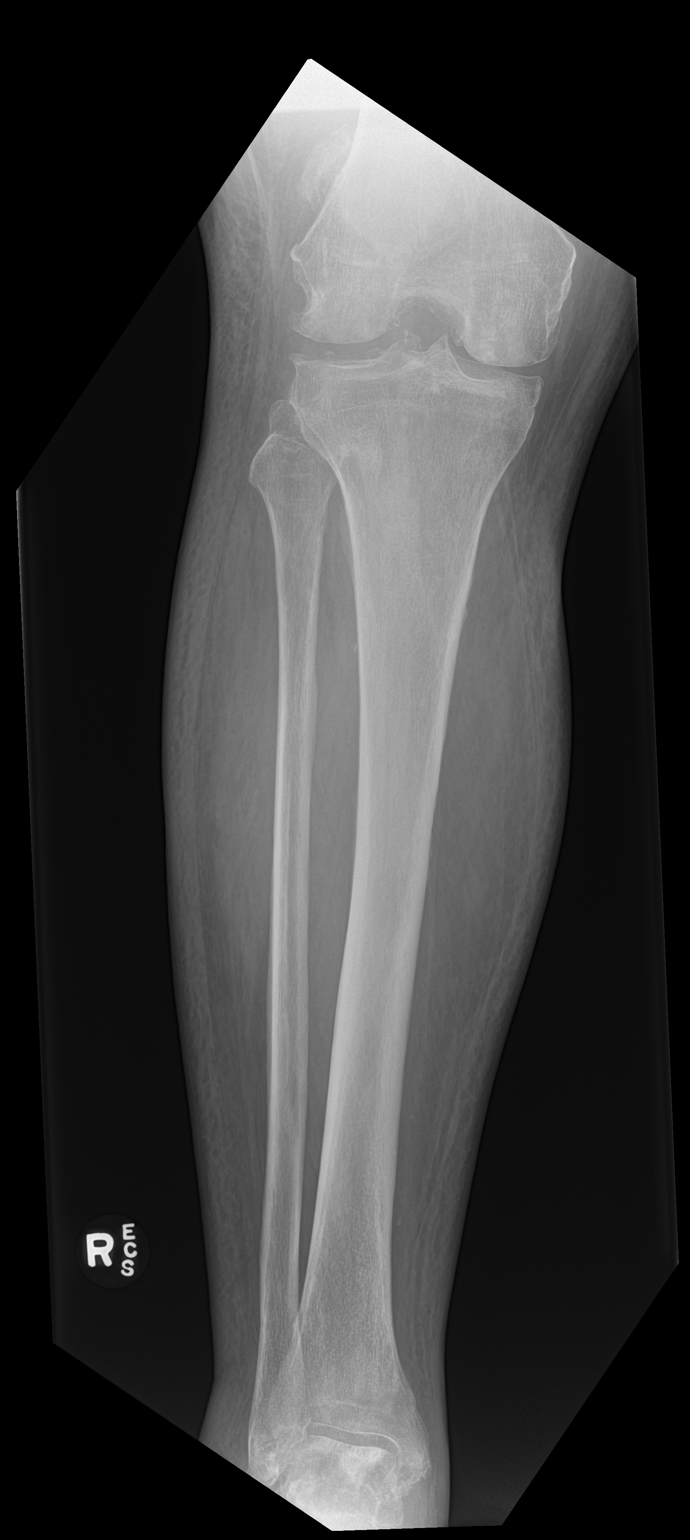

[tibia lat (1 of 2)]
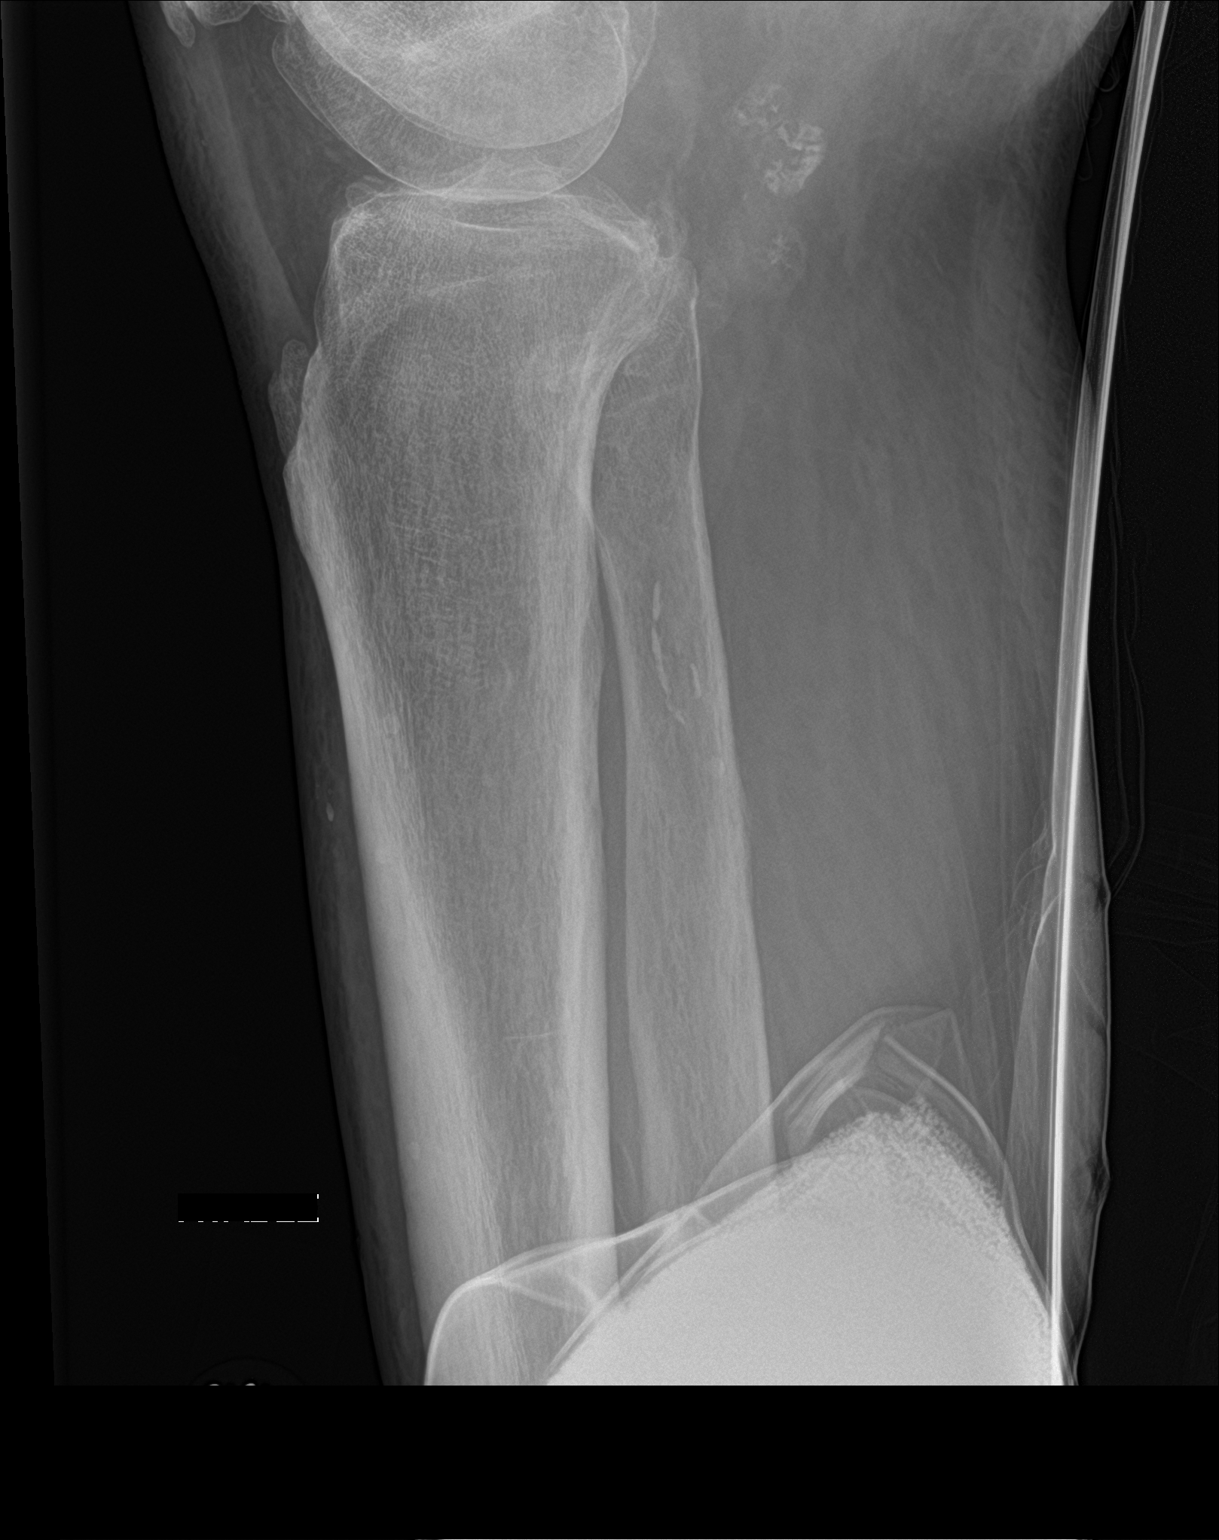

[tibia lat (2 of 2)]
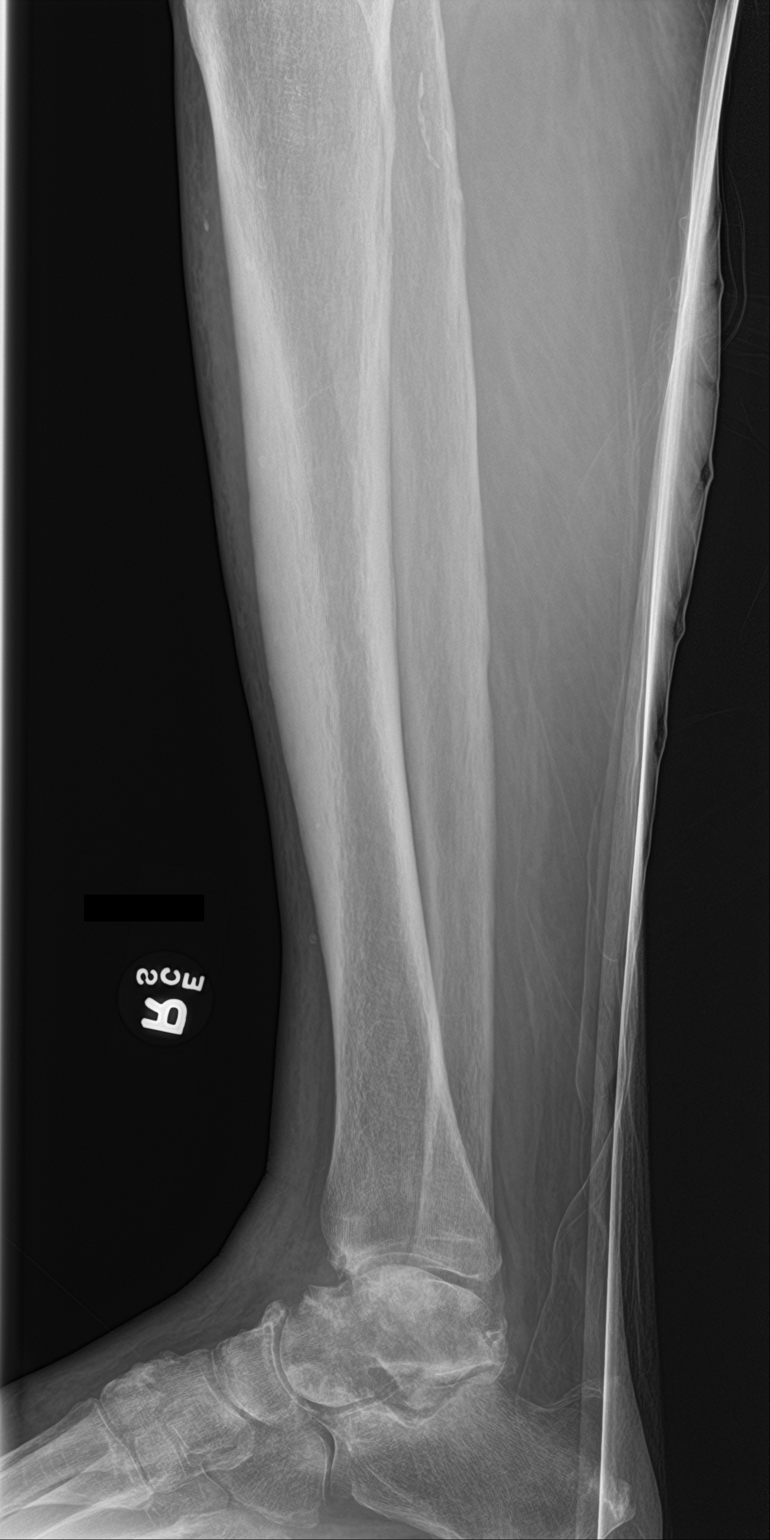

[3 of 3 positions shown; findings below may reference images not displayed]

FINDINGS: There is nonspecific lower extremity edema. There are no pockets of
subcutaneous gas. There are degenerative changes of the knee. There
is no radiopaque foreign body. Vascular calcifications are noted.
There are advanced degenerative changes of the ankle mortise.
IMPRESSION: 1. No acute displaced fracture or dislocation.
2. Nonspecific lower extremity edema.
3. Advanced degenerative changes of the ankle mortise.
